# Patient Record
Sex: Male | Born: 1945 | Race: White | Hispanic: No | Marital: Married | Smoking: Never smoker
Health system: Southern US, Community
[De-identification: ages and names within clinical notes are randomized; demographics above are authoritative.]

## PROBLEM LIST (undated history)

## (undated) DIAGNOSIS — F325 Major depressive disorder, single episode, in full remission: Secondary | ICD-10-CM

## (undated) DIAGNOSIS — K219 Gastro-esophageal reflux disease without esophagitis: Secondary | ICD-10-CM

## (undated) DIAGNOSIS — Z8719 Personal history of other diseases of the digestive system: Secondary | ICD-10-CM

## (undated) DIAGNOSIS — I4891 Unspecified atrial fibrillation: Secondary | ICD-10-CM

## (undated) DIAGNOSIS — K4090 Unilateral inguinal hernia, without obstruction or gangrene, not specified as recurrent: Secondary | ICD-10-CM

## (undated) DIAGNOSIS — M5136 Other intervertebral disc degeneration, lumbar region: Secondary | ICD-10-CM

## (undated) DIAGNOSIS — T7840XA Allergy, unspecified, initial encounter: Secondary | ICD-10-CM

## (undated) DIAGNOSIS — I10 Essential (primary) hypertension: Secondary | ICD-10-CM

## (undated) DIAGNOSIS — E785 Hyperlipidemia, unspecified: Secondary | ICD-10-CM

## (undated) DIAGNOSIS — I499 Cardiac arrhythmia, unspecified: Secondary | ICD-10-CM

## (undated) DIAGNOSIS — Z973 Presence of spectacles and contact lenses: Secondary | ICD-10-CM

## (undated) DIAGNOSIS — C159 Malignant neoplasm of esophagus, unspecified: Secondary | ICD-10-CM

## (undated) DIAGNOSIS — N400 Enlarged prostate without lower urinary tract symptoms: Secondary | ICD-10-CM

## (undated) DIAGNOSIS — T17908A Unspecified foreign body in respiratory tract, part unspecified causing other injury, initial encounter: Secondary | ICD-10-CM

## (undated) DIAGNOSIS — M461 Sacroiliitis, not elsewhere classified: Secondary | ICD-10-CM

## (undated) DIAGNOSIS — K5790 Diverticulosis of intestine, part unspecified, without perforation or abscess without bleeding: Secondary | ICD-10-CM

## (undated) DIAGNOSIS — K3184 Gastroparesis: Secondary | ICD-10-CM

## (undated) DIAGNOSIS — J849 Interstitial pulmonary disease, unspecified: Secondary | ICD-10-CM

## (undated) DIAGNOSIS — J691 Pneumonitis due to inhalation of oils and essences: Secondary | ICD-10-CM

## (undated) DIAGNOSIS — I82409 Acute embolism and thrombosis of unspecified deep veins of unspecified lower extremity: Secondary | ICD-10-CM

## (undated) DIAGNOSIS — K449 Diaphragmatic hernia without obstruction or gangrene: Secondary | ICD-10-CM

## (undated) HISTORY — PX: HERNIA REPAIR: SHX51

## (undated) HISTORY — DX: Gastro-esophageal reflux disease without esophagitis: K21.9

## (undated) HISTORY — PX: EYE SURGERY: SHX253

## (undated) HISTORY — DX: Allergy, unspecified, initial encounter: T78.40XA

## (undated) HISTORY — DX: Unilateral inguinal hernia, without obstruction or gangrene, not specified as recurrent: K40.90

## (undated) HISTORY — PX: SPINAL FUSION: SHX223

## (undated) HISTORY — PX: SMALL INTESTINE SURGERY: SHX150

## (undated) HISTORY — DX: Sacroiliitis, not elsewhere classified: M46.1

## (undated) HISTORY — DX: Hyperlipidemia, unspecified: E78.5

## (undated) HISTORY — DX: Malignant neoplasm of esophagus, unspecified: C15.9

## (undated) HISTORY — PX: ABDOMINAL HERNIA REPAIR: SHX539

## (undated) HISTORY — DX: Gastroparesis: K31.84

## (undated) HISTORY — PX: OTHER SURGICAL HISTORY: SHX169

---
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
## 1995-06-27 HISTORY — PX: ESOPHAGECTOMY: SUR457

## 2010-04-13 DIAGNOSIS — K219 Gastro-esophageal reflux disease without esophagitis: Secondary | ICD-10-CM | POA: Insufficient documentation

## 2010-04-13 DIAGNOSIS — F32A Depression, unspecified: Secondary | ICD-10-CM | POA: Insufficient documentation

## 2010-04-13 DIAGNOSIS — I4891 Unspecified atrial fibrillation: Secondary | ICD-10-CM | POA: Insufficient documentation

## 2010-04-22 DIAGNOSIS — M5126 Other intervertebral disc displacement, lumbar region: Secondary | ICD-10-CM

## 2010-04-22 HISTORY — DX: Other intervertebral disc displacement, lumbar region: M51.26

## 2011-12-19 DIAGNOSIS — I499 Cardiac arrhythmia, unspecified: Secondary | ICD-10-CM

## 2011-12-19 HISTORY — DX: Cardiac arrhythmia, unspecified: I49.9

## 2012-01-10 DIAGNOSIS — K449 Diaphragmatic hernia without obstruction or gangrene: Secondary | ICD-10-CM | POA: Insufficient documentation

## 2012-03-20 DIAGNOSIS — C159 Malignant neoplasm of esophagus, unspecified: Secondary | ICD-10-CM | POA: Insufficient documentation

## 2013-10-06 DIAGNOSIS — G894 Chronic pain syndrome: Secondary | ICD-10-CM | POA: Insufficient documentation

## 2017-06-26 HISTORY — PX: COLONOSCOPY: SHX174

## 2019-01-15 ENCOUNTER — Ambulatory Visit (INDEPENDENT_AMBULATORY_CARE_PROVIDER_SITE_OTHER): Payer: Medicare Other | Admitting: Family Medicine

## 2019-01-15 ENCOUNTER — Other Ambulatory Visit: Payer: Self-pay

## 2019-01-15 VITALS — BP 122/80 | HR 99 | Wt 200.6 lb

## 2019-01-15 DIAGNOSIS — K449 Diaphragmatic hernia without obstruction or gangrene: Secondary | ICD-10-CM

## 2019-01-15 DIAGNOSIS — I824Y2 Acute embolism and thrombosis of unspecified deep veins of left proximal lower extremity: Secondary | ICD-10-CM

## 2019-01-15 DIAGNOSIS — Z0000 Encounter for general adult medical examination without abnormal findings: Secondary | ICD-10-CM | POA: Diagnosis present

## 2019-01-15 DIAGNOSIS — N401 Enlarged prostate with lower urinary tract symptoms: Secondary | ICD-10-CM

## 2019-01-15 DIAGNOSIS — I482 Chronic atrial fibrillation, unspecified: Secondary | ICD-10-CM

## 2019-01-15 DIAGNOSIS — M7989 Other specified soft tissue disorders: Secondary | ICD-10-CM | POA: Diagnosis not present

## 2019-01-15 DIAGNOSIS — M5136 Other intervertebral disc degeneration, lumbar region: Secondary | ICD-10-CM

## 2019-01-15 DIAGNOSIS — R3911 Hesitancy of micturition: Secondary | ICD-10-CM | POA: Diagnosis not present

## 2019-01-15 DIAGNOSIS — M51369 Other intervertebral disc degeneration, lumbar region without mention of lumbar back pain or lower extremity pain: Secondary | ICD-10-CM

## 2019-01-15 DIAGNOSIS — F325 Major depressive disorder, single episode, in full remission: Secondary | ICD-10-CM

## 2019-01-15 DIAGNOSIS — Z8719 Personal history of other diseases of the digestive system: Secondary | ICD-10-CM | POA: Diagnosis not present

## 2019-01-15 DIAGNOSIS — K5790 Diverticulosis of intestine, part unspecified, without perforation or abscess without bleeding: Secondary | ICD-10-CM | POA: Diagnosis not present

## 2019-01-15 DIAGNOSIS — K219 Gastro-esophageal reflux disease without esophagitis: Secondary | ICD-10-CM | POA: Diagnosis not present

## 2019-01-15 MED ORDER — TRAMADOL HCL 50 MG PO TABS: 50.0000 mg | ORAL_TABLET | Freq: Two times a day (BID) | ORAL | 2 refills | Status: DC | PRN

## 2019-01-15 NOTE — Progress Notes (Signed)
[facility] Phone: [phone]   cc: Establish care, leg swelling  Subjective:  Patient recently moved from [place] and is establishing care in [place].  He does not have a primary care doctor yet nor does he establish with any specialists.  He has a history of A. fib, esophageal cancer status post surgical esophagectomy, enlarged prostate, depression, spinal fusion for degenerative disc disease  Leg swelling: Patient states his left calf has been swelling over the past few days and his left calf is tender throughout most of the day.  He has no difficulty breathing.  No pain in the right leg.  Afib: Patient last saw his cardiologist in June at [place].  He has been taking propafenone and diltiazem.  He states this is working well, and has not felt any fluttering (he could previously feel fluttering when he had arrhythmia).  He has had this issue for 7 years.  No ablation.  He has never had a stroke.  GI: Patient had esophageal cancer 1997.  He had surgery to remove part of his esophagus.  This is left him with chronic acid reflux, for which he takes omeprazole.  He had a lot of "hernias" after surgery and 1 of his hernia operations nicked his colon, resulting in scar tissue.  He states he has had 3 small bowel obstructions requiring hospitalization in the past 3 years.  None of which required surgical treatment.  Previous one was June 07, 2018.  States he has a class IV hiatal hernia.  He had a colonoscopy in 2020.  Depression: Patient takes Effexor for depression.  He states this works well.  He has not felt any depressive symptoms, or worsening of depression.  No side effects.    BPH: His previous physician checked the patient's prostate with a yearly digital rectal exam as well as PSAs, although not yearly.  He states it is mildly enlarged.  Patient was on tamsulosin, and his previous session told him that adding finasteride was more beneficial than  doubling his tamsulosin dose, and states that whyhe is on finasteride and tamsulosin.  He has no history of prostate cancer.  Degenerative disc disease: Patient had an L2-L5 spinal fusion 2 years ago.  5 weeks ago he recently had an L5 and S1 fusion.  He goes back to his neurosurgeon in [place] next week, but will need to establish with neurosurgery here in [place].  The patient takes tramadol 50 mg as needed, which equates to approximately 1 or 2 times a day.  Some days he goes without taking it.  Patient states he cannot take NSAIDs because of his esophageal surgeries.  Patient states that his previous neurosurgeon recommended a [facility] out here in [place]. Tramadol 50mg  cant take NSAIDs d/t.  PRN is once or twice a day.  Some days without taking it.    Social: Patient works at [employer], but this is part-time.  He likes to stay active.  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  Patient is married, with children.  His son lives in [place], his daughter lives in [place].  Patient drinks a beer to every other day.  He does not smoke.  ROS: See HPI for pertinent positives and negatives  Past Medical History  Family history reviewed for today's visit. No changes.  Social history- patient is a non-smoker  Objective: BP 122/80   Pulse 99   Wt 200 lb 9.6 oz (91 kg)   SpO2 96%  Gen:  NAD, alert and oriented, cooperative with exam HEENT: NCAT, EOMI, MMM CV: normal rate, regular rhythm.  2/6 systolic murmur , no rubs.  Resp: LCTAB, no wheezes, crackles. normal work of breathing GI: nontender to palpation, BS present, no guarding or organomegaly Msk: Left leg has 1+ pitting edema in the distal foreleg.  No significant asymmetry between the left and right legs.  Left calf is tender to palpation. Neuro: CN II-XII grossly intact. no gross deficits Skin: No rashes, no lesions Psych: Appropriate behavior  Assessment/Plan: Leg swelling Patient planing of multiple  days of left calf soreness and swelling of the left ankle.  On exam he did have 1+ pitting edema and tenderness to palpation on the left side.  Patient has no history of DVT, not on anticoagulation.  Out of concern for DVT, sent patient to get ultrasound on left leg at the hospital.  Will treat with anticoagulation if DVT is confirmed.  Atrial fibrillation [facility]) Patient was diagnosed with atrial fibrillation 7 years ago.  He had a cardiologist in [place] who treated him with propafenone and diltiazem.  Patient states he is well-controlled these medications.  Patient would like to establish with cardiologist here, and would like referral.  Patient does not need any more prescriptions at the moment. -Place referral to cardiology  Acid reflux disease Patient was diagnosed with esophageal cancer 1997 and had a surgery to remove part of his esophagus, leaving him with a chronic hiatal hernia, which she states is class IV.  Patient currently takes omeprazole.  Does not need a refill at this moment. -Continue omeprazole as prescribed. -Refer to gastroenterology  H/O small bowel obstruction Patient has had multiple hospitalizations requiring medical management of small bowel obstruction in the past 3 years due to adhesions and scar tissue from previous abdominal surgeries.  Not currently experiencing any signs or symptoms of bowel obstruction.  Hiatal hernia Patient had surgical removal of part of his esophagus, leaving him with a hiatal hernia which he states is class IV.  Patient seeing a gastroenterologist [place], needs GI referral now that he lives in [place].  Takes omeprazole for the resultant acid reflux.  Depression, major, single episode, complete remission [facility]) Patient states he takes Effexor for depression which is currently well controlled he states.  Expensing any side effects.  Does not need any new refills currently. -Continue Effexor  BPH (benign prostatic hyperplasia)  Patient states he has been diagnosed with BPH and was having symptoms of urgency, hesitation but states he is well controlled on his tamsulosin 0.4 as well has his finasteride.  He states he has no history of prostate cancer and has recently had a PSA within the past 2 years at his primary care doctor in [place]. -Continue finasteride and tamsulosin  Degenerative disc disease, lumbar Patient previously had a L2-L5 spinal fusion for disc disease.  5 weeks ago he had L5-S1 fusion.  States he has chronic mild pain that gets worse with activity.  He is on tramadol 50 mg as needed.  He states he takes this 1-2 times a day, but not every day.  Check PDMP and patient does not fact get monthly prescriptions for 50 mg as needed. - Prescribed 1 month prescription for tramadol 50 mg twice daily as needed.    Clemetine Marker, MD PGY-2

## 2019-01-15 NOTE — Patient Instructions (Signed)
It was great to meet you today,  I will put in a referral for a gastroenterologist and a cardiologist.  I have refilled your tramadol at twice a day as needed.  Have put in a order for a ultrasound of your left leg.  You can go to the [facility], which is right next door and asked them for a radiology department and you can get the ultrasound done there.  I would like you get that done today if possible.  I would like to see you back in 2 months so we can discuss how things are going after you have talked with the new cardiologist and gastroenterologist, and see how your pain is doing after the spinal fusion.  Have a great day,  Clemetine Marker, MD

## 2019-01-16 ENCOUNTER — Ambulatory Visit (HOSPITAL_COMMUNITY)
Admission: RE | Admit: 2019-01-16 | Discharge: 2019-01-16 | Disposition: A | Discharge status: Home / Self Care | Payer: Medicare Other | Source: Ambulatory Visit | Attending: Family Medicine | Admitting: Family Medicine

## 2019-01-16 ENCOUNTER — Other Ambulatory Visit (HOSPITAL_COMMUNITY): Payer: Self-pay | Admitting: *Deleted

## 2019-01-16 ENCOUNTER — Other Ambulatory Visit (HOSPITAL_COMMUNITY): Payer: Self-pay

## 2019-01-16 ENCOUNTER — Ambulatory Visit (HOSPITAL_BASED_OUTPATIENT_CLINIC_OR_DEPARTMENT_OTHER)
Admission: RE | Admit: 2019-01-16 | Discharge: 2019-01-16 | Disposition: A | Discharge status: Home / Self Care | Payer: Medicare Other | Source: Ambulatory Visit | Attending: Family Medicine | Admitting: Family Medicine

## 2019-01-16 DIAGNOSIS — Z981 Arthrodesis status: Secondary | ICD-10-CM | POA: Diagnosis not present

## 2019-01-16 DIAGNOSIS — M5117 Intervertebral disc disorders with radiculopathy, lumbosacral region: Secondary | ICD-10-CM | POA: Diagnosis not present

## 2019-01-16 DIAGNOSIS — M5417 Radiculopathy, lumbosacral region: Secondary | ICD-10-CM

## 2019-01-16 DIAGNOSIS — M7989 Other specified soft tissue disorders: Secondary | ICD-10-CM | POA: Diagnosis not present

## 2019-01-16 LAB — CBC
Hematocrit: 38.8 % (ref 37.5–51.0)
Hemoglobin: 13.4 g/dL (ref 13.0–17.7)
MCH: 31.5 pg (ref 26.6–33.0)
MCHC: 34.5 g/dL (ref 31.5–35.7)
MCV: 91 fL (ref 79–97)
Platelets: 250 10*3/uL (ref 150–450)
RBC: 4.26 x10E6/uL (ref 4.14–5.80)
RDW: 11.9 % (ref 11.6–15.4)
WBC: 6.4 10*3/uL (ref 3.4–10.8)

## 2019-01-16 LAB — COMPREHENSIVE METABOLIC PANEL
ALT: 14 IU/L (ref 0–44)
AST: 15 IU/L (ref 0–40)
Albumin/Globulin Ratio: 1.8 (ref 1.2–2.2)
Albumin: 4.2 g/dL (ref 3.7–4.7)
Alkaline Phosphatase: 181 IU/L — ABNORMAL HIGH (ref 39–117)
BUN/Creatinine Ratio: 18 (ref 10–24)
BUN: 14 mg/dL (ref 8–27)
Bilirubin Total: <0.2 mg/dL (ref 0.0–1.2)
CO2: 23 mmol/L (ref 20–29)
Calcium: 9.6 mg/dL (ref 8.6–10.2)
Chloride: 102 mmol/L (ref 96–106)
Creatinine, Ser: 0.8 mg/dL (ref 0.76–1.27)
GFR calc Af Amer: 103 mL/min/{1.73_m2} (ref >59)
GFR calc non Af Amer: 89 mL/min/{1.73_m2} (ref >59)
Globulin, Total: 2.4 g/dL (ref 1.5–4.5)
Glucose: 119 mg/dL — ABNORMAL HIGH (ref 65–99)
Potassium: 4.7 mmol/L (ref 3.5–5.2)
Sodium: 138 mmol/L (ref 134–144)
Total Protein: 6.6 g/dL (ref 6.0–8.5)

## 2019-01-17 ENCOUNTER — Encounter: Payer: Self-pay | Admitting: Family Medicine

## 2019-01-17 DIAGNOSIS — M5136 Other intervertebral disc degeneration, lumbar region: Secondary | ICD-10-CM

## 2019-01-17 DIAGNOSIS — I82409 Acute embolism and thrombosis of unspecified deep veins of unspecified lower extremity: Secondary | ICD-10-CM

## 2019-01-17 DIAGNOSIS — N400 Enlarged prostate without lower urinary tract symptoms: Secondary | ICD-10-CM

## 2019-01-17 DIAGNOSIS — M51369 Other intervertebral disc degeneration, lumbar region without mention of lumbar back pain or lower extremity pain: Secondary | ICD-10-CM

## 2019-01-17 DIAGNOSIS — K5790 Diverticulosis of intestine, part unspecified, without perforation or abscess without bleeding: Secondary | ICD-10-CM

## 2019-01-17 DIAGNOSIS — K449 Diaphragmatic hernia without obstruction or gangrene: Secondary | ICD-10-CM

## 2019-01-17 DIAGNOSIS — F339 Major depressive disorder, recurrent, unspecified: Secondary | ICD-10-CM | POA: Insufficient documentation

## 2019-01-17 DIAGNOSIS — M7989 Other specified soft tissue disorders: Secondary | ICD-10-CM

## 2019-01-17 DIAGNOSIS — K219 Gastro-esophageal reflux disease without esophagitis: Secondary | ICD-10-CM | POA: Insufficient documentation

## 2019-01-17 DIAGNOSIS — I4891 Unspecified atrial fibrillation: Secondary | ICD-10-CM

## 2019-01-17 DIAGNOSIS — F325 Major depressive disorder, single episode, in full remission: Secondary | ICD-10-CM

## 2019-01-17 DIAGNOSIS — Z8719 Personal history of other diseases of the digestive system: Secondary | ICD-10-CM

## 2019-01-17 HISTORY — DX: Diverticulosis of intestine, part unspecified, without perforation or abscess without bleeding: K57.90

## 2019-01-17 HISTORY — DX: Personal history of other diseases of the digestive system: Z87.19

## 2019-01-17 HISTORY — DX: Major depressive disorder, single episode, in full remission: F32.5

## 2019-01-17 HISTORY — DX: Unspecified atrial fibrillation: I48.91

## 2019-01-17 HISTORY — DX: Gastro-esophageal reflux disease without esophagitis: K21.9

## 2019-01-17 HISTORY — DX: Other intervertebral disc degeneration, lumbar region: M51.36

## 2019-01-17 HISTORY — DX: Other specified soft tissue disorders: M79.89

## 2019-01-17 HISTORY — DX: Benign prostatic hyperplasia without lower urinary tract symptoms: N40.0

## 2019-01-17 HISTORY — DX: Diaphragmatic hernia without obstruction or gangrene: K44.9

## 2019-01-17 HISTORY — DX: Acute embolism and thrombosis of unspecified deep veins of unspecified lower extremity: I82.409

## 2019-01-17 HISTORY — DX: Other intervertebral disc degeneration, lumbar region without mention of lumbar back pain or lower extremity pain: M51.369

## 2019-01-17 NOTE — Assessment & Plan Note (Signed)
Patient planing of multiple days of left calf soreness and swelling of the left ankle.  On exam he did have 1+ pitting edema and tenderness to palpation on the left side.  Patient has no history of DVT, not on anticoagulation.  Out of concern for DVT, sent patient to get ultrasound on left leg at the hospital.  Will treat with anticoagulation if DVT is confirmed.

## 2019-01-17 NOTE — Assessment & Plan Note (Signed)
Patient was diagnosed with esophageal cancer 1997 and had a surgery to remove part of his esophagus, leaving him with a chronic hiatal hernia, which she states is class IV.  Patient currently takes omeprazole.  Does not need a refill at this moment. -Continue omeprazole as prescribed. -Refer to gastroenterology

## 2019-01-17 NOTE — Assessment & Plan Note (Signed)
Patient had surgical removal of part of his esophagus, leaving him with a hiatal hernia which he states is class IV.  Patient seeing a gastroenterologist [place], needs GI referral now that he lives in [place].  Takes omeprazole for the resultant acid reflux.

## 2019-01-17 NOTE — Assessment & Plan Note (Signed)
Patient has had multiple hospitalizations requiring medical management of small bowel obstruction in the past 3 years due to adhesions and scar tissue from previous abdominal surgeries.  Not currently experiencing any signs or symptoms of bowel obstruction.

## 2019-01-17 NOTE — Assessment & Plan Note (Signed)
Patient previously had a L2-L5 spinal fusion for disc disease.  5 weeks ago he had L5-S1 fusion.  States he has chronic mild pain that gets worse with activity.  He is on tramadol 50 mg as needed.  He states he takes this 1-2 times a day, but not every day.  Check PDMP and patient does not fact get monthly prescriptions for 50 mg as needed. - Prescribed 1 month prescription for tramadol 50 mg twice daily as needed.

## 2019-01-17 NOTE — Assessment & Plan Note (Signed)
Patient states he has been diagnosed with BPH and was having symptoms of urgency, hesitation but states he is well controlled on his tamsulosin 0.4 as well has his finasteride.  He states he has no history of prostate cancer and has recently had a PSA within the past 2 years at his primary care doctor in [place]. -Continue finasteride and tamsulosin

## 2019-01-17 NOTE — Assessment & Plan Note (Signed)
Patient states he takes Effexor for depression which is currently well controlled he states.  Expensing any side effects.  Does not need any new refills currently. -Continue Effexor

## 2019-01-17 NOTE — Assessment & Plan Note (Signed)
Patient was diagnosed with atrial fibrillation 7 years ago.  He had a cardiologist in [place] who treated him with propafenone and diltiazem.  Patient states he is well-controlled these medications.  Patient would like to establish with cardiologist here, and would like referral.  Patient does not need any more prescriptions at the moment. -Place referral to cardiology

## 2019-01-18 ENCOUNTER — Encounter: Payer: Self-pay | Admitting: Family Medicine

## 2019-01-19 ENCOUNTER — Telehealth: Payer: Self-pay | Admitting: Family Medicine

## 2019-01-19 NOTE — Telephone Encounter (Signed)
Called patient to inform him of the results of his lab work and ultrasound.  Told him there was no concerning findings on either.  Advised him to call the office if he had any questions or if the swelling and pain in his left leg worsened.

## 2019-01-31 ENCOUNTER — Telehealth: Payer: Self-pay | Admitting: Gastroenterology

## 2019-01-31 NOTE — Telephone Encounter (Signed)
DOD 01/20/19  Dr. Silverio Decamp, this pt has GI hx at [facility] (records in [place] for review.)  Pt requested to transfer care due to proximity because he had just moved to [place].  Pt has hx of esophageal cancer.   Will you accept this pt?

## 2019-02-04 ENCOUNTER — Encounter: Payer: Self-pay | Admitting: Gastroenterology

## 2019-02-04 NOTE — Telephone Encounter (Signed)
Okay, please schedule next available office visit.  Thanks

## 2019-02-06 ENCOUNTER — Other Ambulatory Visit: Payer: Self-pay | Admitting: *Deleted

## 2019-02-07 MED ORDER — VENLAFAXINE HCL ER 150 MG PO CP24: 150.0000 mg | ORAL_CAPSULE | Freq: Every day | ORAL | 3 refills | Status: DC

## 2019-02-07 MED ORDER — TAMSULOSIN HCL 0.4 MG PO CAPS: 0.4000 mg | ORAL_CAPSULE | Freq: Every day | ORAL | 3 refills | Status: DC

## 2019-02-07 MED ORDER — DILTIAZEM HCL 120 MG PO TABS: 120.0000 mg | ORAL_TABLET | Freq: Every day | ORAL | 3 refills | Status: DC

## 2019-03-06 ENCOUNTER — Other Ambulatory Visit: Payer: Self-pay

## 2019-03-07 MED ORDER — OMEPRAZOLE 40 MG PO CPDR: 40.0000 mg | DELAYED_RELEASE_CAPSULE | Freq: Two times a day (BID) | ORAL | 3 refills | Status: DC

## 2019-03-16 NOTE — Progress Notes (Signed)
Cardiology Office Note:    Date:  03/18/2019   ID:  Thomas Dominguez, [place] July 28, 1945, MRN [id number]  PCP:  Benay Pike, MD  Cardiologist:  No primary care provider on file.  Electrophysiologist:  None   Referring MD: Martyn Malay, MD   Chief complaint: AF  History of Present Illness:    Thomas Dominguez. Thomas Dominguez is a 73 y.o. male with a hx of atrial fibrillation, esophageal cancer status post esophagectomy, BPH, depression, degenerative disc disease who is referred by Dr. Owens Shark for evaluation of atrial fibrillation.  He reports that he was diagnosed with atrial fibrillation 7 years ago.  He was previously seeing a cardiologist in [place].  He had been alternating stress test and echo every other year.  Has been on propafenone and diltiazem for 7 years.  States that he always feels it when he goes into AF.  Will occur every 3-4 days, can last up to 1 hour.  Feels short of breath when in AF.  Had back surgery in June, has been improving.  States that walks 1.5 miles per day.  Has been having dyspnea with exertion but able to complete his walks.  He reports that he checks his BP at home, has always been normal.     Past Medical History:  Diagnosis Date  . Acid reflux disease 01/17/2019  . Atrial fibrillation ([place] [place]) 01/17/2019  . BPH (benign prostatic hyperplasia) 01/17/2019  . Degenerative disc disease, lumbar 01/17/2019  . Depression, major, single episode, complete remission ([place]) 01/17/2019  . Diverticulosis 01/17/2019  . DVT (deep venous thrombosis) ([place]) 01/17/2019  . H/O small bowel obstruction 01/17/2019  . Hiatal hernia 01/17/2019    Past Surgical History:  Procedure Laterality Date  . ABDOMINAL HERNIA REPAIR  2012, 2014, 2017  . ESOPHAGECTOMY  1997   for esophageal cancer  . SPINAL FUSION  2018, 2020   L2 L5 in 2018, L5-S1 2020    Current Medications: Current Meds  Medication Sig  . aspirin EC 81 MG tablet Take 81 mg by mouth daily.  Marland Kitchen diltiazem (CARDIZEM) 120 MG tablet  Take 1 tablet (120 mg total) by mouth daily.  . finasteride (PROSCAR) 5 MG tablet Take 5 mg by mouth daily.  Marland Kitchen omeprazole (PRILOSEC) 40 MG capsule Take 1 capsule (40 mg total) by mouth 2 (two) times daily.  . propafenone (RYTHMOL) 225 MG tablet Take 225 mg by mouth every 8 (eight) hours.  . tamsulosin (FLOMAX) 0.4 MG CAPS capsule Take 1 capsule (0.4 mg total) by mouth daily.  . traMADol (ULTRAM) 50 MG tablet Take 1 tablet (50 mg total) by mouth every 12 (twelve) hours as needed for moderate pain.  Marland Kitchen venlafaxine XR (EFFEXOR-XR) 150 MG 24 hr capsule Take 1 capsule (150 mg total) by mouth daily with breakfast.     Allergies:   Meloxicam and Statins   Social History   Socioeconomic History  . Marital status: Married    Spouse name: [name]  . Number of children: 2  . Years of education: Not on file  . Highest education level: Not on file  Occupational History    Employer: [employer]  . Financial resource strain: Not on file  . Food insecurity    Worry: Not on file    Inability: Not on file  . Transportation needs    Medical: Not on file    Non-medical: Not on file  Tobacco Use  . Smoking status: Never Smoker  . Smokeless tobacco:  Never Used  Substance and Sexual Activity  . Alcohol use: Yes    Alcohol/week: 7.0 standard drinks    Types: 7 Cans of beer per week  . Drug use: Never  . Sexual activity: Not on file  Lifestyle  . Physical activity    Days per week: Not on file    Minutes per session: Not on file  . Stress: Not on file  Relationships  . Social Herbalist on phone: Not on file    Gets together: Not on file    Attends religious service: Not on file    Active member of club or organization: Not on file    Attends meetings of clubs or organizations: Not on file    Relationship status: Not on file  Other Topics Concern  . Not on file  Social History Narrative  . Not on file     Family History: The patient's family history includes  Emphysema in his father.  ROS:   Please see the history of present illness.     All other systems reviewed and are negative.  EKGs/Labs/Other Studies Reviewed:    The following studies were reviewed today:   EKG:  EKG is ordered today.  The ekg ordered today demonstrates NSR, rate 81, no ST/T changes  Recent Labs: 01/15/2019: ALT 14; BUN 14; Creatinine, Ser 0.80; Hemoglobin 13.4; Platelets 250; Potassium 4.7; Sodium 138  Recent Lipid Panel No results found for: CHOL, TRIG, HDL, CHOLHDL, VLDL, LDLCALC, LDLDIRECT  Physical Exam:    VS:  BP 137/87 (BP Location: Right Arm)   Pulse 81   Ht 6\' 1"  (1.854 m)   Wt 206 lb 6.4 oz (93.6 kg)   SpO2 98%   BMI 27.23 kg/m     Wt Readings from Last 3 Encounters:  03/18/19 206 lb 6.4 oz (93.6 kg)  01/15/19 200 lb 9.6 oz (91 kg)     GEN:  Well nourished, well developed in no acute distress HEENT: Normal NECK: No JVD; No carotid bruits LYMPHATICS: No lymphadenopathy CARDIAC: RRR, no murmurs, rubs, gallops RESPIRATORY:  Clear to auscultation without rales, wheezing or rhonchi  ABDOMEN: Soft, non-tender, non-distended MUSCULOSKELETAL:  No edema; No deformity  SKIN: Warm and dry NEUROLOGIC:  Alert and oriented x 3 PSYCHIATRIC:  Normal affect   ASSESSMENT:    1. Atrial fibrillation, unspecified type ([place])   2. Dyspnea on exertion    PLAN:    In order of problems listed above:  Atrial fibrillation: paroxysmal, on propafenone 225 mg 3 times daily and diltiazem 120 mg daily.  CHADS-VASc 1 given age - Discussed with patient that with CHADS-VASc 1 could consider anticoagulation but he prefers to hold off for now - Continue propafenone and diltiazem.  Check TTE to ensure no structural heart disease given propafenone use - Given symptoms suggestive of recurrent AF occurring every 3-4 days, will plan for Zio patch x 2 weeks.  If having recurrent AF and continues to be symptomatic, will plan for referral to EP to discuss further antiarrhythmic  options vs ablation  Dyspnea: could be related to deconditioning as he recovers from recent back surgery, but may be due to AF as above - TTE   Medication Adjustments/Labs and Tests Ordered: Current medicines are reviewed at length with the patient today.  Concerns regarding medicines are outlined above.  Orders Placed This Encounter  Procedures  . LONG TERM MONITOR (3-14 DAYS)  . EKG 12-Lead  . ECHOCARDIOGRAM COMPLETE   No orders  of the defined types were placed in this encounter.   Patient Instructions  Medication Instructions:  Your physician recommends that you continue on your current medications as directed. Please refer to the Current Medication list given to you today.  If you need a refill on your cardiac medications before your next appointment, please call your pharmacy.   Lab work: NONE  Testing/Procedures: Your physician has requested that you have an echocardiogram. Echocardiography is a painless test that uses sound waves to create images of your heart. It provides your doctor with information about the size and shape of your heart and how well your heart's chambers and valves are working. This procedure takes approximately one hour. There are no restrictions for this procedure. [place] has recommended that you wear a 14 DAY ZIO-PATCH monitor. The Zio patch cardiac monitor continuously records heart rhythm data for up to 14 days, this is for patients being evaluated for multiple types heart rhythms. For the first 24 hours post application, please avoid getting the Zio monitor wet in the shower or by excessive sweating during exercise. After that, feel free to carry on with regular activities. Keep soaps and lotions away from the ZIO XT Patch.     Follow-Up: Your physician recommends that you schedule a follow-up appointment in: 3 months with Dr. Gardiner Rhyme         Echocardiogram An echocardiogram is a procedure that uses  painless sound waves (ultrasound) to produce an image of the heart. Images from an echocardiogram can provide important information about:  Signs of coronary artery disease (CAD).  Aneurysm detection. An aneurysm is a weak or damaged part of an artery wall that bulges out from the normal force of blood pumping through the body.  Heart size and shape. Changes in the size or shape of the heart can be associated with certain conditions, including heart failure, aneurysm, and CAD.  Heart muscle function.  Heart valve function.  Signs of a past heart attack.  Fluid buildup around the heart.  Thickening of the heart muscle.  A tumor or infectious growth around the heart valves. Tell a health care provider about:  Any allergies you have.  All medicines you are taking, including vitamins, herbs, eye drops, creams, and over-the-counter medicines.  Any blood disorders you have.  Any surgeries you have had.  Any medical conditions you have.  Whether you are pregnant or may be pregnant. What are the risks? Generally, this is a safe procedure. However, problems may occur, including:  Allergic reaction to dye (contrast) that may be used during the procedure. What happens before the procedure? No specific preparation is needed. You may eat and drink normally. What happens during the procedure?   An IV tube may be inserted into one of your veins.  You may receive contrast through this tube. A contrast is an injection that improves the quality of the pictures from your heart.  A gel will be applied to your chest.  A wand-like tool (transducer) will be moved over your chest. The gel will help to transmit the sound waves from the transducer.  The sound waves will harmlessly bounce off of your heart to allow the heart images to be captured in real-time motion. The images will be recorded on a computer. The procedure may vary among health care providers and hospitals. What happens after  the procedure?  You may return to your normal, everyday life, including diet, activities, and medicines, unless  your health care provider tells you not to do that. Summary  An echocardiogram is a procedure that uses painless sound waves (ultrasound) to produce an image of the heart.  Images from an echocardiogram can provide important information about the size and shape of your heart, heart muscle function, heart valve function, and fluid buildup around your heart.  You do not need to do anything to prepare before this procedure. You may eat and drink normally.  After the echocardiogram is completed, you may return to your normal, everyday life, unless your health care provider tells you not to do that. This information is not intended to replace advice given to you by your health care provider. Make sure you discuss any questions you have with your health care provider. Document Released: 06/09/2000 Document Revised: 10/03/2018 Document Reviewed: 07/15/2016 Elsevier Patient Education  2020 [place], Donato Heinz, MD  03/18/2019 11:28 AM    [facility]

## 2019-03-17 ENCOUNTER — Ambulatory Visit: Payer: Medicare Other | Admitting: Gastroenterology

## 2019-03-18 ENCOUNTER — Ambulatory Visit (INDEPENDENT_AMBULATORY_CARE_PROVIDER_SITE_OTHER): Payer: Medicare HMO | Admitting: Cardiology

## 2019-03-18 ENCOUNTER — Other Ambulatory Visit: Payer: Self-pay

## 2019-03-18 ENCOUNTER — Encounter: Payer: Self-pay | Admitting: Cardiology

## 2019-03-18 VITALS — BP 137/87 | HR 81 | Ht 73.0 in | Wt 206.4 lb

## 2019-03-18 DIAGNOSIS — I4891 Unspecified atrial fibrillation: Secondary | ICD-10-CM

## 2019-03-18 DIAGNOSIS — R0609 Other forms of dyspnea: Secondary | ICD-10-CM

## 2019-03-18 DIAGNOSIS — R0600 Dyspnea, unspecified: Secondary | ICD-10-CM

## 2019-03-18 NOTE — Patient Instructions (Signed)
Medication Instructions:  Your physician recommends that you continue on your current medications as directed. Please refer to the Current Medication list given to you today.  If you need a refill on your cardiac medications before your next appointment, please call your pharmacy.   Lab work: NONE  Testing/Procedures: Your physician has requested that you have an echocardiogram. Echocardiography is a painless test that uses sound waves to create images of your heart. It provides your doctor with information about the size and shape of your heart and how well your heart's chambers and valves are working. This procedure takes approximately one hour. There are no restrictions for this procedure. [place] has recommended that you wear a 14 DAY ZIO-PATCH monitor. The Zio patch cardiac monitor continuously records heart rhythm data for up to 14 days, this is for patients being evaluated for multiple types heart rhythms. For the first 24 hours post application, please avoid getting the Zio monitor wet in the shower or by excessive sweating during exercise. After that, feel free to carry on with regular activities. Keep soaps and lotions away from the ZIO XT Patch.     Follow-Up: Your physician recommends that you schedule a follow-up appointment in: 3 months with Dr. Gardiner Rhyme         Echocardiogram An echocardiogram is a procedure that uses painless sound waves (ultrasound) to produce an image of the heart. Images from an echocardiogram can provide important information about:  Signs of coronary artery disease (CAD).  Aneurysm detection. An aneurysm is a weak or damaged part of an artery wall that bulges out from the normal force of blood pumping through the body.  Heart size and shape. Changes in the size or shape of the heart can be associated with certain conditions, including heart failure, aneurysm, and CAD.  Heart muscle function.  Heart valve  function.  Signs of a past heart attack.  Fluid buildup around the heart.  Thickening of the heart muscle.  A tumor or infectious growth around the heart valves. Tell a health care provider about:  Any allergies you have.  All medicines you are taking, including vitamins, herbs, eye drops, creams, and over-the-counter medicines.  Any blood disorders you have.  Any surgeries you have had.  Any medical conditions you have.  Whether you are pregnant or may be pregnant. What are the risks? Generally, this is a safe procedure. However, problems may occur, including:  Allergic reaction to dye (contrast) that may be used during the procedure. What happens before the procedure? No specific preparation is needed. You may eat and drink normally. What happens during the procedure?   An IV tube may be inserted into one of your veins.  You may receive contrast through this tube. A contrast is an injection that improves the quality of the pictures from your heart.  A gel will be applied to your chest.  A wand-like tool (transducer) will be moved over your chest. The gel will help to transmit the sound waves from the transducer.  The sound waves will harmlessly bounce off of your heart to allow the heart images to be captured in real-time motion. The images will be recorded on a computer. The procedure may vary among health care providers and hospitals. What happens after the procedure?  You may return to your normal, everyday life, including diet, activities, and medicines, unless your health care provider tells you not to do that. Summary  An echocardiogram is  a procedure that uses painless sound waves (ultrasound) to produce an image of the heart.  Images from an echocardiogram can provide important information about the size and shape of your heart, heart muscle function, heart valve function, and fluid buildup around your heart.  You do not need to do anything to prepare  before this procedure. You may eat and drink normally.  After the echocardiogram is completed, you may return to your normal, everyday life, unless your health care provider tells you not to do that. This information is not intended to replace advice given to you by your health care provider. Make sure you discuss any questions you have with your health care provider. Document Released: 06/09/2000 Document Revised: 10/03/2018 Document Reviewed: 07/15/2016 Elsevier Patient Education  2020 [employer].

## 2019-03-20 ENCOUNTER — Other Ambulatory Visit (HOSPITAL_COMMUNITY): Payer: Self-pay | Admitting: *Deleted

## 2019-03-20 ENCOUNTER — Other Ambulatory Visit: Payer: Self-pay

## 2019-03-20 ENCOUNTER — Other Ambulatory Visit (HOSPITAL_COMMUNITY): Payer: Self-pay

## 2019-03-20 ENCOUNTER — Ambulatory Visit (HOSPITAL_COMMUNITY)
Admission: RE | Admit: 2019-03-20 | Discharge: 2019-03-20 | Disposition: A | Discharge status: Home / Self Care | Payer: Medicare HMO | Source: Ambulatory Visit | Attending: Surgical | Admitting: Surgical

## 2019-03-20 DIAGNOSIS — Z09 Encounter for follow-up examination after completed treatment for conditions other than malignant neoplasm: Secondary | ICD-10-CM

## 2019-03-20 DIAGNOSIS — Z981 Arthrodesis status: Secondary | ICD-10-CM | POA: Insufficient documentation

## 2019-03-20 DIAGNOSIS — M5117 Intervertebral disc disorders with radiculopathy, lumbosacral region: Secondary | ICD-10-CM | POA: Insufficient documentation

## 2019-03-20 DIAGNOSIS — M532X7 Spinal instabilities, lumbosacral region: Secondary | ICD-10-CM | POA: Insufficient documentation

## 2019-03-20 DIAGNOSIS — M4326 Fusion of spine, lumbar region: Secondary | ICD-10-CM | POA: Diagnosis not present

## 2019-03-20 DIAGNOSIS — M47816 Spondylosis without myelopathy or radiculopathy, lumbar region: Secondary | ICD-10-CM | POA: Diagnosis not present

## 2019-03-25 ENCOUNTER — Other Ambulatory Visit: Payer: Self-pay

## 2019-03-25 ENCOUNTER — Ambulatory Visit (INDEPENDENT_AMBULATORY_CARE_PROVIDER_SITE_OTHER): Payer: Medicare HMO

## 2019-03-25 DIAGNOSIS — I4891 Unspecified atrial fibrillation: Secondary | ICD-10-CM

## 2019-03-25 DIAGNOSIS — M545 Low back pain: Secondary | ICD-10-CM | POA: Diagnosis not present

## 2019-03-26 ENCOUNTER — Other Ambulatory Visit: Payer: Self-pay

## 2019-03-27 ENCOUNTER — Other Ambulatory Visit: Payer: Self-pay

## 2019-03-28 NOTE — Telephone Encounter (Signed)
Yes we can refill 

## 2019-04-01 NOTE — Telephone Encounter (Signed)
Pt calling requesting a refill on propafenone 225 mg tablet sent to [facility] mail order pharmacy. Please address

## 2019-04-02 ENCOUNTER — Telehealth: Payer: Self-pay | Admitting: *Deleted

## 2019-04-02 ENCOUNTER — Other Ambulatory Visit: Payer: Self-pay

## 2019-04-02 ENCOUNTER — Ambulatory Visit (HOSPITAL_COMMUNITY): Payer: Medicare HMO | Attending: Cardiovascular Disease

## 2019-04-02 DIAGNOSIS — R0600 Dyspnea, unspecified: Secondary | ICD-10-CM | POA: Diagnosis not present

## 2019-04-02 DIAGNOSIS — R0609 Other forms of dyspnea: Secondary | ICD-10-CM

## 2019-04-02 MED ORDER — PROPAFENONE HCL 225 MG PO TABS: 225.0000 mg | ORAL_TABLET | Freq: Three times a day (TID) | ORAL | 1 refills | Status: DC

## 2019-04-02 MED ORDER — PROPAFENONE HCL 225 MG PO TABS: 225.0000 mg | ORAL_TABLET | Freq: Three times a day (TID) | ORAL | 0 refills | Status: DC

## 2019-04-02 NOTE — Telephone Encounter (Signed)
Rx request sent to pharmacy.  

## 2019-04-02 NOTE — Telephone Encounter (Signed)
Propafenone was sent to [facility] for a 30 day supply, received refill request from [facility] mail order. Please send 90 day supply.

## 2019-04-02 NOTE — Telephone Encounter (Signed)
Received call from [facility] [phone] regarding patients Effexor and Rythmol potential interaction. Neither was started by Dr Gardiner Rhyme when he saw him on 03/18/19 and both on medication list. Discussed with Raquel R Pharm D and ok to continue. Advised [facility]

## 2019-04-02 NOTE — Addendum Note (Signed)
Addended by: Therisa Doyne on: 04/02/2019 11:09 AM   Modules accepted: Orders

## 2019-04-03 ENCOUNTER — Encounter: Payer: Self-pay | Admitting: Family Medicine

## 2019-04-04 ENCOUNTER — Other Ambulatory Visit: Payer: Self-pay | Admitting: *Deleted

## 2019-04-04 ENCOUNTER — Other Ambulatory Visit: Payer: Self-pay | Admitting: Family Medicine

## 2019-04-04 MED ORDER — TAMSULOSIN HCL 0.4 MG PO CAPS: 0.8000 mg | ORAL_CAPSULE | Freq: Every day | ORAL | 3 refills | Status: DC

## 2019-04-04 MED ORDER — FINASTERIDE 5 MG PO TABS: 5.0000 mg | ORAL_TABLET | Freq: Every day | ORAL | 3 refills | Status: DC

## 2019-04-04 NOTE — Progress Notes (Signed)
Increased tamsulosin to 0.8mg  daily d/t worsening bph symptoms.

## 2019-04-17 DIAGNOSIS — I4891 Unspecified atrial fibrillation: Secondary | ICD-10-CM | POA: Diagnosis not present

## 2019-04-18 ENCOUNTER — Encounter

## 2019-04-18 ENCOUNTER — Encounter: Payer: Self-pay | Admitting: Gastroenterology

## 2019-04-18 ENCOUNTER — Ambulatory Visit: Payer: Medicare HMO | Admitting: Gastroenterology

## 2019-04-18 VITALS — BP 96/60 | HR 80 | Temp 97.8°F | Ht 71.0 in | Wt 204.2 lb

## 2019-04-18 DIAGNOSIS — Z86003 Personal history of in-situ neoplasm of oral cavity, esophagus and stomach: Secondary | ICD-10-CM | POA: Diagnosis not present

## 2019-04-18 DIAGNOSIS — Z8719 Personal history of other diseases of the digestive system: Secondary | ICD-10-CM

## 2019-04-18 DIAGNOSIS — K5900 Constipation, unspecified: Secondary | ICD-10-CM | POA: Diagnosis not present

## 2019-04-18 DIAGNOSIS — K219 Gastro-esophageal reflux disease without esophagitis: Secondary | ICD-10-CM | POA: Diagnosis not present

## 2019-04-18 DIAGNOSIS — K589 Irritable bowel syndrome without diarrhea: Secondary | ICD-10-CM | POA: Diagnosis not present

## 2019-04-18 DIAGNOSIS — K5790 Diverticulosis of intestine, part unspecified, without perforation or abscess without bleeding: Secondary | ICD-10-CM

## 2019-04-18 MED ORDER — SUCRALFATE 1 G PO TABS: 1.0000 g | ORAL_TABLET | Freq: Three times a day (TID) | ORAL | 3 refills | Status: DC

## 2019-04-18 MED ORDER — OMEPRAZOLE 40 MG PO CPDR: 40.0000 mg | DELAYED_RELEASE_CAPSULE | Freq: Two times a day (BID) | ORAL | 3 refills | Status: DC

## 2019-04-18 NOTE — Progress Notes (Signed)
Thomas Dominguez    [id number]    June 08, 1946  [facility], Garen Lah, MD  Referring Physician: Martyn Malay, MD [facility],  [place] [address]   Chief complaint: Hiatal hernia, GERD  HPI: 73 year old male with history of Barrett's esophagus, intramucosal adenocarcinoma s/p esophageal resection, chronic GERD here to establish care.  He continues to have persistent GERD symptoms despite taking PPI daily.  Symptoms have improved since he started taking omeprazole twice daily.  No longer has dysphagia or odynophagia.  Denies any vomiting, abdominal pain, melena or blood per rectum.  He was treated with oral antibiotics earlier this year for diverticulitis.  No complications.  No family history of GI malignancy  Relevant history :  04/02/2019: LVEF 55-60%  Colonoscopy 10/03/2017 Dr. Shirlyn Goltz (f/u diverticulitis)- severe pancolonic diverticulosis, tortuosity & spasm  EGD 03/2015 Dr. Shirlyn Goltz- changes of prior esophagectomy and gastric pull-through, normal mucosa throughout  EGD 03/2011 Dr. Shirlyn Goltz- surgically shortened anastomosis at 21 cm, no Barrett's changes seen. Fair amount of solid food retention. Gastric food retention obscuring pylorus. Very angulated atrium without stenosis, probably contributing to emptying difficulties. Because of coughing difficulties did not pursue duodenal intubation  EGD 10/2010 Dr. Shirlyn Goltz- gastric food retention obscuring pylorus  CLN 09/12/2012 Dr. Shirlyn Goltz- pancolonic diverticulosis, less than ideal prep, internal hemorrhoids  CLN 2004- severe left-sided diverticulosis  RECENT GI IMAGING STUDIES:  AXR 05/2018- no free air identified, no dilated bowel loops  CTAP 05/2018 SMALL BOWEL OBSTRUCTION. TRANSITION POINT IS NEAR A SMALL BOWEL ANASTOMOSIS IN THE DISTAL SMALL BOWEL CENTRAL ABDOMEN IN THE REGION OF THE CENTRAL MESENTERY. EXACT TRANSITION POINT IS DIFFICULT TO IDENTIFY. EFFUSIONS CANNOT BE EXCLUDED. NO  DEFINITIVE FOCAL MASS. NO ADDITIONAL SIGNIFICANT CHANGE.  PET scan 07/2017 NO EVIDENCE OF HYPERMETABOLIC MALIGNANCY IN THE CHEST, ABDOMEN OR PELVIS. HYPERMETABOLIC ACTIVITY NEAR THE SMALL BOWEL ANASTOMOSIS HAS THE APPEARANCE OF PHYSIOLOGIC INTRALUMINAL BOWEL ACTIVITY WITH NO PERCEIVED MASS SEEN ON TODAY'S EXAM  PREVIOUS ABDOMINAL SURGERIES: Ventral hernia repair, paraesophageal hernia repair (2013), esophagectomy (1997)  CANCER HISTORY: Esophageal cancer s/p esophagectomy 1997 at [facility]. No family history of colon cancer.  Barium swallow 08/28/96 Post op anastomotic stricture s/p esophagogastrectomy. 9mm barium tablet did not traverse.  Outpatient Encounter Medications as of 04/18/2019  Medication Sig  . aspirin EC 81 MG tablet Take 81 mg by mouth daily.  Marland Kitchen diltiazem (CARDIZEM) 120 MG tablet Take 1 tablet (120 mg total) by mouth daily.  . finasteride (PROSCAR) 5 MG tablet Take 1 tablet (5 mg total) by mouth daily.  Marland Kitchen omeprazole (PRILOSEC) 40 MG capsule Take 1 capsule (40 mg total) by mouth 2 (two) times daily.  . propafenone (RYTHMOL) 225 MG tablet Take 1 tablet (225 mg total) by mouth every 8 (eight) hours.  . tamsulosin (FLOMAX) 0.4 MG CAPS capsule Take 2 capsules (0.8 mg total) by mouth daily.  Marland Kitchen venlafaxine XR (EFFEXOR-XR) 150 MG 24 hr capsule Take 1 capsule (150 mg total) by mouth daily with breakfast.   No facility-administered encounter medications on file as of 04/18/2019.     Allergies as of 04/18/2019 - Review Complete 04/18/2019  Allergen Reaction Noted  . Mobic [meloxicam]  01/17/2019  . Statins  01/17/2019    Past Medical History:  Diagnosis Date  . Acid reflux disease 01/17/2019  . Atrial fibrillation ([place]) 01/17/2019  . BPH (benign prostatic hyperplasia) 01/17/2019  . Degenerative disc disease, lumbar 01/17/2019  . Depression, major, single episode, complete  remission ([place]) 01/17/2019  . Diverticulosis 01/17/2019  . DVT (deep venous thrombosis) ([place]) 01/17/2019  .  Esophageal cancer ([place])   . GERD (gastroesophageal reflux disease)   . H/O small bowel obstruction 01/17/2019  . Hiatal hernia 01/17/2019    Past Surgical History:  Procedure Laterality Date  . ABDOMINAL HERNIA REPAIR  2012, 2014, 2017   ventral  . ESOPHAGECTOMY  1997   for esophageal cancer  . SPINAL FUSION  2018, 2020   L2 L5 in 2018, L5-S1 2020    Family History  Problem Relation Age of Onset  . Emphysema Father     Social History   Socioeconomic History  . Marital status: Married    Spouse name: [name]  . Number of children: 2  . Years of education: Not on file  . Highest education level: Not on file  Occupational History  . Occupation: retired    Fish farm manager: [employer]  Social Needs  . Financial resource strain: Not on file  . Food insecurity    Worry: Not on file    Inability: Not on file  . Transportation needs    Medical: Not on file    Non-medical: Not on file  Tobacco Use  . Smoking status: Never Smoker  . Smokeless tobacco: Never Used  Substance and Sexual Activity  . Alcohol use: Yes    Alcohol/week: 7.0 standard drinks    Types: 7 Cans of beer per week  . Drug use: Never  . Sexual activity: Not on file  Lifestyle  . Physical activity    Days per week: Not on file    Minutes per session: Not on file  . Stress: Not on file  Relationships  . Social Herbalist on phone: Not on file    Gets together: Not on file    Attends religious service: Not on file    Active member of club or organization: Not on file    Attends meetings of clubs or organizations: Not on file    Relationship status: Not on file  . Intimate partner violence    Fear of current or ex partner: Not on file    Emotionally abused: Not on file    Physically abused: Not on file    Forced sexual activity: Not on file  Other Topics Concern  . Not on file  Social History Narrative  . Not on file      Review of systems: Review of Systems  Constitutional: Negative  for fever and chills.  Positive for fatigue HENT: Negative.   Eyes: Negative for blurred vision.  Respiratory: Negative for cough, shortness of breath and wheezing.   Cardiovascular: Negative for chest pain and palpitations.  Gastrointestinal: as per HPI Genitourinary: Negative for dysuria, urgency, frequency and hematuria.  Musculoskeletal: Positive for myalgias, back pain and joint pain.  Skin: Negative for itching and rash.  Neurological: Negative for dizziness, tremors, focal weakness, seizures and loss of consciousness.  Endo/Heme/Allergies: Positive for seasonal allergies.  Psychiatric/Behavioral: Negative for depression, suicidal ideas and hallucinations.  Positive for patient and anxiety All other systems reviewed and are negative.   Physical Exam: Vitals:   04/18/19 1005  Temp: 97.8 F (36.6 C)   Body mass index is 28.49 kg/m. Gen:      No acute distress HEENT:  EOMI, sclera anicteric Neck:     No masses; no thyromegaly Lungs:    Clear to auscultation bilaterally; normal respiratory effort CV:  Regular rate and rhythm; no murmurs Abd:      + bowel sounds; soft, non-tender; no palpable masses, no distension Ext:    No edema; adequate peripheral perfusion Skin:      Warm and dry; no rash Neuro: alert and oriented x 3 Psych: normal mood and affect  Data Reviewed:  Reviewed labs, radiology imaging, old records and pertinent past GI work up   Assessment and Plan/Recommendations:  73 year old male with history of chronic GERD, hiatal hernia, Barrett's esophagus, esophageal adenocarcinoma status post resection hiatal hernia repair with persistent GERD symptoms.  Continue omeprazole twice daily Use Carafate 1 g before meals and at bedtime as needed Discussed antireflux measures, dietary and lifestyle modifications in detail  Pancolonic diverticulosis with history of diverticulitis April 2020 Up-to-date with colorectal cancer screening Benefiber 1 tablespoon 3  times daily with meals Increase fluid intake  IBS: Trial of IBgard 1 capsule up to 3 times daily as needed  Return in 6 months or sooner if needed   Greater than 50% of the time used for counseling  as well as treatment plan and follow-up. He had multiple questions which were answered to his satisfaction  K. Denzil Magnuson , MD    CC: Martyn Malay, MD

## 2019-04-18 NOTE — Patient Instructions (Addendum)
Continue Omeprazole twice daily  We will send Carafate to your pharmacy  Take IBGard 1 capsule three times a day as needed  Use Benefiber 1 tablespoon three times a day with meals   Gastroesophageal Reflux Disease, Adult Gastroesophageal reflux (GER) happens when acid from the stomach flows up into the tube that connects the mouth and the stomach (esophagus). Normally, food travels down the esophagus and stays in the stomach to be digested. However, when a person has GER, food and stomach acid sometimes move back up into the esophagus. If this becomes a more serious problem, the person may be diagnosed with a disease called gastroesophageal reflux disease (GERD). GERD occurs when the reflux:  Happens often.  Causes frequent or severe symptoms.  Causes problems such as damage to the esophagus. When stomach acid comes in contact with the esophagus, the acid may cause soreness (inflammation) in the esophagus. Over time, GERD may create small holes (ulcers) in the lining of the esophagus. What are the causes? This condition is caused by a problem with the muscle between the esophagus and the stomach (lower esophageal sphincter, or LES). Normally, the LES muscle closes after food passes through the esophagus to the stomach. When the LES is weakened or abnormal, it does not close properly, and that allows food and stomach acid to go back up into the esophagus. The LES can be weakened by certain dietary substances, medicines, and medical conditions, including:  Tobacco use.  Pregnancy.  Having a hiatal hernia.  Alcohol use.  Certain foods and beverages, such as coffee, chocolate, onions, and peppermint. What increases the risk? You are more likely to develop this condition if you:  Have an increased body weight.  Have a connective tissue disorder.  Use NSAID medicines. What are the signs or symptoms? Symptoms of this condition include:  Heartburn.  Difficult or painful  swallowing.  The feeling of having a lump in the throat.  Abitter taste in the mouth.  Bad breath.  Having a large amount of saliva.  Having an upset or bloated stomach.  Belching.  Chest pain. Different conditions can cause chest pain. Make sure you see your health care provider if you experience chest pain.  Shortness of breath or wheezing.  Ongoing (chronic) cough or a night-time cough.  Wearing away of tooth enamel.  Weight loss. How is this diagnosed? Your health care provider will take a medical history and perform a physical exam. To determine if you have mild or severe GERD, your health care provider may also monitor how you respond to treatment. You may also have tests, including:  A test to examine your stomach and esophagus with a small camera (endoscopy).  A test thatmeasures the acidity level in your esophagus.  A test thatmeasures how much pressure is on your esophagus.  A barium swallow or modified barium swallow test to show the shape, size, and functioning of your esophagus. How is this treated? The goal of treatment is to help relieve your symptoms and to prevent complications. Treatment for this condition may vary depending on how severe your symptoms are. Your health care provider may recommend:  Changes to your diet.  Medicine.  Surgery. Follow these instructions at home: Eating and drinking   Follow a diet as recommended by your health care provider. This may involve avoiding foods and drinks such as: ? Coffee and tea (with or without caffeine). ? Drinks that containalcohol. ? Energy drinks and sports drinks. ? Carbonated drinks or sodas. ? Chocolate  and cocoa. ? Peppermint and mint flavorings. ? Garlic and onions. ? Horseradish. ? Spicy and acidic foods, including peppers, chili powder, curry powder, vinegar, hot sauces, and barbecue sauce. ? Citrus fruit juices and citrus fruits, such as oranges, lemons, and limes. ? Tomato-based  foods, such as red sauce, chili, salsa, and pizza with red sauce. ? Fried and fatty foods, such as donuts, french fries, potato chips, and high-fat dressings. ? High-fat meats, such as hot dogs and fatty cuts of red and white meats, such as rib eye steak, sausage, ham, and bacon. ? High-fat dairy items, such as whole milk, butter, and cream cheese.  Eat small, frequent meals instead of large meals.  Avoid drinking large amounts of liquid with your meals.  Avoid eating meals during the 2-3 hours before bedtime.  Avoid lying down right after you eat.  Do not exercise right after you eat. Lifestyle   Do not use any products that contain nicotine or tobacco, such as cigarettes, e-cigarettes, and chewing tobacco. If you need help quitting, ask your health care provider.  Try to reduce your stress by using methods such as yoga or meditation. If you need help reducing stress, ask your health care provider.  If you are overweight, reduce your weight to an amount that is healthy for you. Ask your health care provider for guidance about a safe weight loss goal. General instructions  Pay attention to any changes in your symptoms.  Take over-the-counter and prescription medicines only as told by your health care provider. Do not take aspirin, ibuprofen, or other NSAIDs unless your health care provider told you to do so.  Wear loose-fitting clothing. Do not wear anything tight around your waist that causes pressure on your abdomen.  Raise (elevate) the head of your bed about 6 inches (15 cm).  Avoid bending over if this makes your symptoms worse.  Keep all follow-up visits as told by your health care provider. This is important. Contact a health care provider if:  You have: ? New symptoms. ? Unexplained weight loss. ? Difficulty swallowing or it hurts to swallow. ? Wheezing or a persistent cough. ? A hoarse voice.  Your symptoms do not improve with treatment. Get help right away if  you:  Have pain in your arms, neck, jaw, teeth, or back.  Feel sweaty, dizzy, or light-headed.  Have chest pain or shortness of breath.  Vomit and your vomit looks like blood or coffee grounds.  Faint.  Have stool that is bloody or black.  Cannot swallow, drink, or eat. Summary  Gastroesophageal reflux happens when acid from the stomach flows up into the esophagus. GERD is a disease in which the reflux happens often, causes frequent or severe symptoms, or causes problems such as damage to the esophagus.  Treatment for this condition may vary depending on how severe your symptoms are. Your health care provider may recommend diet and lifestyle changes, medicine, or surgery.  Contact a health care provider if you have new or worsening symptoms.  Take over-the-counter and prescription medicines only as told by your health care provider. Do not take aspirin, ibuprofen, or other NSAIDs unless your health care provider told you to do so.  Keep all follow-up visits as told by your health care provider. This is important. This information is not intended to replace advice given to you by your health care provider. Make sure you discuss any questions you have with your health care provider. Document Released: 03/22/2005 Document Revised: 12/19/2017 Document  Reviewed: 12/19/2017 Elsevier Patient Education  2020 [place] for Gastroesophageal Reflux Disease, Adult When you have gastroesophageal reflux disease (GERD), the foods you eat and your eating habits are very important. Choosing the right foods can help ease your discomfort. Think about working with a nutrition specialist (dietitian) to help you make good choices. What are tips for following this plan?  Meals  Choose healthy foods that are low in fat, such as fruits, vegetables, whole grains, low-fat dairy products, and lean meat, fish, and poultry.  Eat small meals often instead of 3 large meals a day. Eat your  meals slowly, and in a place where you are relaxed. Avoid bending over or lying down until 2-3 hours after eating.  Avoid eating meals 2-3 hours before bed.  Avoid drinking a lot of liquid with meals.  Cook foods using methods other than frying. Bake, grill, or broil food instead.  Avoid or limit: ? Chocolate. ? Peppermint or spearmint. ? Alcohol. ? Pepper. ? Black and decaffeinated coffee. ? Black and decaffeinated tea. ? Bubbly (carbonated) soft drinks. ? Caffeinated energy drinks and soft drinks.  Limit high-fat foods such as: ? Fatty meat or fried foods. ? Whole milk, cream, butter, or ice cream. ? Nuts and nut butters. ? Pastries, donuts, and sweets made with butter or shortening.  Avoid foods that cause symptoms. These foods may be different for everyone. Common foods that cause symptoms include: ? Tomatoes. ? Oranges, lemons, and limes. ? Peppers. ? Spicy food. ? Onions and garlic. ? Vinegar. Lifestyle  Maintain a healthy weight. Ask your doctor what weight is healthy for you. If you need to lose weight, work with your doctor to do so safely.  Exercise for at least 30 minutes for 5 or more days each week, or as told by your doctor.  Wear loose-fitting clothes.  Do not smoke. If you need help quitting, ask your doctor.  Sleep with the head of your bed higher than your feet. Use a wedge under the mattress or blocks under the bed frame to raise the head of the bed. Summary  When you have gastroesophageal reflux disease (GERD), food and lifestyle choices are very important in easing your symptoms.  Eat small meals often instead of 3 large meals a day. Eat your meals slowly, and in a place where you are relaxed.  Limit high-fat foods such as fatty meat or fried foods.  Avoid bending over or lying down until 2-3 hours after eating.  Avoid peppermint and spearmint, caffeine, alcohol, and chocolate. This information is not intended to replace advice given to you by  your health care provider. Make sure you discuss any questions you have with your health care provider. Document Released: 12/12/2011 Document Revised: 10/03/2018 Document Reviewed: 07/18/2016 Elsevier Patient Education  [place].   I appreciate the  opportunity to care for you  Thank You   Harl Bowie , MD

## 2019-04-21 ENCOUNTER — Other Ambulatory Visit: Payer: Self-pay

## 2019-04-21 MED ORDER — OMEPRAZOLE 40 MG PO CPDR: 40.0000 mg | DELAYED_RELEASE_CAPSULE | Freq: Two times a day (BID) | ORAL | 3 refills | Status: DC

## 2019-04-23 ENCOUNTER — Telehealth: Payer: Self-pay | Admitting: Cardiology

## 2019-04-23 NOTE — Telephone Encounter (Signed)
Sent patient results in [place], verbatim from MD  Notes recorded by Donato Heinz, MD on 04/23/2019 at 9:30 AM EDT  No atrial fibrillation seen. Does have some short runs of a different arrhythmia, SVT, but nothing lasting longer than 11 seconds. Would continue current medications

## 2019-04-23 NOTE — Telephone Encounter (Signed)
Follow Up:      Pt saw his Monitor results on My-Chart. Please call, he thinks he might need an appointment to come in and discuss this with Dr Gardiner Rhyme.

## 2019-04-24 ENCOUNTER — Encounter: Payer: Self-pay | Admitting: Gastroenterology

## 2019-04-26 ENCOUNTER — Other Ambulatory Visit: Payer: Self-pay | Admitting: Cardiology

## 2019-05-06 ENCOUNTER — Other Ambulatory Visit: Payer: Self-pay

## 2019-05-06 MED ORDER — PROPAFENONE HCL 225 MG PO TABS: 225.0000 mg | ORAL_TABLET | Freq: Three times a day (TID) | ORAL | 1 refills | Status: DC

## 2019-05-14 ENCOUNTER — Other Ambulatory Visit: Payer: Self-pay | Admitting: Cardiology

## 2019-06-09 NOTE — Progress Notes (Signed)
Cardiology Office Note:    Date:  06/10/2019   ID:  Thomas Dominguez, [place]thun 13-Oct-1945, MRN [id number]  PCP:  Benay Pike, MD  Cardiologist:  No primary care provider on file.  Electrophysiologist:  None   Referring MD: Benay Pike, MD   Chief complaint: AF  History of Present Illness:    Thomas Dominguez. [name] is a 73 y.o. male with a hx of atrial fibrillation, esophageal cancer status post esophagectomy, BPH, depression, degenerative disc disease who is referred by Dr. Owens Shark for evaluation of atrial fibrillation.  He reports that he was diagnosed with atrial fibrillation 7 years ago.  He was previously seeing a cardiologist in [place].  He had been alternating stress test and echo every other year.  Has been on propafenone and diltiazem for 7 years.  States that he always feels it when he goes into AF.  Will occur every 3-4 days, can last up to 1 hour.  Feels short of breath when in AF.  Had back surgery in June, has been improving.  States that walks 1.5 miles per day.  Has been having dyspnea with exertion but able to complete his walks.  He reports that he checks his BP at home, has always been normal.   TTE 04/02/2019 showed EF 55 to 39%, grade 2 diastolic dysfunction, normal RV function, no significant valvular disease.  14-day Zio patch showed no recurrent atrial fibrillation but did show 20 episodes of SVT, longest lasting 11 seconds.  Triggered events corresponded to sinus rhythm and rare PVCs.  Reports that palpitations have resolved.  Reports shortness of breath has improved.  Feels much improved as he has recovered from his back surgery.  States that the most exertion he does is walking dog and doing yard work.  Denies any exertional chest pain.  Does report some dyspnea with significant exertion, but states that he feels like he can do everything that he wants to do  Past Medical History:  Diagnosis Date  . Acid reflux disease 01/17/2019  . Atrial fibrillation ([place]) 01/17/2019    . BPH (benign prostatic hyperplasia) 01/17/2019  . Degenerative disc disease, lumbar 01/17/2019  . Depression, major, single episode, complete remission ([place]) 01/17/2019  . Diverticulosis 01/17/2019  . DVT (deep venous thrombosis) ([place]) 01/17/2019  . Esophageal cancer ([place])   . GERD (gastroesophageal reflux disease)   . H/O small bowel obstruction 01/17/2019  . Hiatal hernia 01/17/2019    Past Surgical History:  Procedure Laterality Date  . ABDOMINAL HERNIA REPAIR  2012, 2014, 2017   ventral  . ESOPHAGECTOMY  1997   for esophageal cancer  . SPINAL FUSION  2018, 2020   L2 L5 in 2018, L5-S1 2020    Current Medications: Current Meds  Medication Sig  . aspirin EC 81 MG tablet Take 81 mg by mouth daily.  Marland Kitchen diltiazem (CARDIZEM) 120 MG tablet Take 1 tablet (120 mg total) by mouth daily.  . finasteride (PROSCAR) 5 MG tablet Take 1 tablet (5 mg total) by mouth daily.  Marland Kitchen omeprazole (PRILOSEC) 40 MG capsule Take 1 capsule (40 mg total) by mouth 2 (two) times daily.  . propafenone (RYTHMOL) 225 MG tablet TAKE 1 TABLET (225 MG TOTAL) BY MOUTH EVERY 8 HOURS.  Marland Kitchen sucralfate (CARAFATE) 1 g tablet Take 1 tablet (1 g total) by mouth 4 (four) times daily -  with meals and at bedtime.  . tamsulosin (FLOMAX) 0.4 MG CAPS capsule Take 2 capsules (0.8 mg total) by mouth daily.  Marland Kitchen  venlafaxine XR (EFFEXOR-XR) 150 MG 24 hr capsule Take 1 capsule (150 mg total) by mouth daily with breakfast.  . [DISCONTINUED] propafenone (RYTHMOL) 225 MG tablet TAKE 1 TABLET (225 MG TOTAL) BY MOUTH EVERY 8 HOURS.     Allergies:   Mobic [meloxicam] and Statins   Social History   Socioeconomic History  . Marital status: Married    Spouse name: [name]  . Number of children: 2  . Years of education: Not on file  . Highest education level: Not on file  Occupational History  . Occupation: retired    Fish farm manager: [employer]  Tobacco Use  . Smoking status: Never Smoker  . Smokeless tobacco: Never Used  Substance and Sexual  Activity  . Alcohol use: Yes    Alcohol/week: 7.0 standard drinks    Types: 7 Cans of beer per week  . Drug use: Never  . Sexual activity: Not on file  Other Topics Concern  . Not on file  Social History Narrative  . Not on file   Social Determinants of Health   Financial Resource Strain:   . Difficulty of Paying Living Expenses: Not on file  Food Insecurity:   . Worried About Charity fundraiser in the Last Year: Not on file  . Ran Out of Food in the Last Year: Not on file  Transportation Needs:   . Lack of Transportation (Medical): Not on file  . Lack of Transportation (Non-Medical): Not on file  Physical Activity:   . Days of Exercise per Week: Not on file  . Minutes of Exercise per Session: Not on file  Stress:   . Feeling of Stress : Not on file  Social Connections:   . Frequency of Communication with Friends and Family: Not on file  . Frequency of Social Gatherings with Friends and Family: Not on file  . Attends Religious Services: Not on file  . Active Member of Clubs or Organizations: Not on file  . Attends Archivist Meetings: Not on file  . Marital Status: Not on file     Family History: The patient's family history includes Emphysema in his father.  ROS:   Please see the history of present illness.     All other systems reviewed and are negative.  EKGs/Labs/Other Studies Reviewed:    The following studies were reviewed today:   EKG:  EKG is not ordered today.  The ekg ordered 03/18/2019 demonstrates NSR, rate 81, no ST/T changes  Cardiac monitor 04/22/19:  No atrial fibrillation seen. 20 episodes of SVT, longest lasting 11 seconds.   13 days of data recorded on Zio monitor. Patient had a min HR of 59 bpm, max HR of 174 bpm, and avg HR of 87 bpm. Predominant underlying rhythm was Sinus Rhythm. No VT, atrial fibrillation, high degree block, or pauses noted. Isolated atrial and ventricular ectopy was rare (<1%). 20 episodes of of SVT, longest  lasting 11 seconds (rate 144 bpm); fastest was rate 174 bpm, lasting 12 beats.There were 8 triggered events, which corresponded to sinus rhythm and rare PVCs. No significant arrhythmias detected.  TTE 04/02/19:  1. Left ventricular ejection fraction, by visual estimation, is 55 to 60%. The left ventricle has normal function. There is no left ventricular hypertrophy.  2. Left ventricular diastolic Doppler parameters are consistent with pseudonormalization pattern of LV diastolic filling.  3. Global right ventricle has normal systolic function.The right ventricular size is mildly enlarged. No increase in right ventricular wall thickness.  4. Left  atrial size was normal.  5. Right atrial size was normal.  6. The mitral valve is normal in structure. No evidence of mitral valve regurgitation. No evidence of mitral stenosis.  7. The tricuspid valve is normal in structure. Tricuspid valve regurgitation is trivial.  8. The aortic valve is normal in structure. Aortic valve regurgitation was not visualized by color flow Doppler.  9. The pulmonic valve was normal in structure. Pulmonic valve regurgitation is not visualized by color flow Doppler. 10. Normal pulmonary artery systolic pressure. 11. The atrial septum is grossly normal.  Recent Labs: 01/15/2019: ALT 14; BUN 14; Creatinine, Ser 0.80; Hemoglobin 13.4; Platelets 250; Potassium 4.7; Sodium 138  Recent Lipid Panel No results found for: CHOL, TRIG, HDL, CHOLHDL, VLDL, LDLCALC, LDLDIRECT  Physical Exam:    VS:  BP 134/85   Pulse 82   Temp (!) 97.2 F (36.2 C)   Ht 6' 1"  (1.854 m)   Wt 206 lb (93.4 kg)   SpO2 98%   BMI 27.18 kg/m     Wt Readings from Last 3 Encounters:  06/10/19 206 lb (93.4 kg)  04/18/19 204 lb 4 oz (92.6 kg)  03/18/19 206 lb 6.4 oz (93.6 kg)     GEN:  Well nourished, well developed in no acute distress HEENT: Normal NECK: No JVD; No carotid bruits LYMPHATICS: No lymphadenopathy CARDIAC: RRR, no murmurs, rubs,  gallops RESPIRATORY:  Clear to auscultation without rales, wheezing or rhonchi  ABDOMEN: Soft, non-tender, non-distended MUSCULOSKELETAL: 1+ bilateral lower extremity edema; No deformity  SKIN: Warm and dry NEUROLOGIC:  Alert and oriented x 3 PSYCHIATRIC:  Normal affect   ASSESSMENT:    1. Lower extremity edema   2. PAF (paroxysmal atrial fibrillation) ([place])   3. SVT (supraventricular tachycardia) ([place])   4. DOE (dyspnea on exertion)   5. Screening cholesterol level    PLAN:    In order of problems listed above:  Atrial fibrillation: paroxysmal, on propafenone 225 mg 3 times daily and diltiazem 120 mg daily.  CHADS-VASc 1 given age.  Cardiac monitor 04/22/2019 shows no evidence of recurrent A. fib - Discussed with patient that with CHADS-VASc 1 could consider anticoagulation but he prefers to hold off for now - Continue propafenone and diltiazem.  No structural heart disease on TTE 04/02/2019  SVT: Having intermittent episodes on monitor, longest lasting up to 11 seconds.  Reports palpitations have resolved.  Continue diltiazem and propafenone as above  Dyspnea: Suspect was related to deconditioning as he recovered from recent back surgery, has improved.  Diastolic dysfunction on TTE could be contributing, will check BNP  Lower extremity edema: 1+ lower extremity edema on exam.  Will check C met, BNP  Cardiovascular risk assessment: No recent lipid panel, will check lipids today  RTC in 6 months  Medication Adjustments/Labs and Tests Ordered: Current medicines are reviewed at length with the patient today.  Concerns regarding medicines are outlined above.  Orders Placed This Encounter  Procedures  . Brain natriuretic peptide  . Lipid panel  . Comprehensive metabolic panel   Meds ordered this encounter  Medications  . propafenone (RYTHMOL) 225 MG tablet    Sig: TAKE 1 TABLET (225 MG TOTAL) BY MOUTH EVERY 8 HOURS.    Dispense:  180 tablet    Refill:  3    Patient  Instructions  Medication Instructions:  No changes   Lab Work: BNP LIPID CMP  If you have labs (blood work) drawn today and your tests are completely normal, you  will receive your results only by: Marland Kitchen MyChart Message (if you have MyChart) OR . A paper copy in the mail If you have any lab test that is abnormal or we need to change your treatment, we will call you to review the results.  Testing/Procedures: Not needed  Follow-Up: At [facility], you and your health needs are our priority.  As part of our continuing mission to provide you with exceptional heart care, we have created designated Provider Care Teams.  These Care Teams include your primary Cardiologist (physician) and Advanced Practice Providers (APPs -  Physician Assistants and Nurse Practitioners) who all work together to provide you with the care you need, when you need it.  Your next appointment:   6 month(s)  The format for your next appointment:   In Person  Provider:   Oswaldo Milian, MD  Other Instructions WILL [facility] OFFICE    Signed, Donato Heinz, MD  06/10/2019 2:43 PM    [place]

## 2019-06-10 ENCOUNTER — Other Ambulatory Visit: Payer: Self-pay

## 2019-06-10 ENCOUNTER — Ambulatory Visit (INDEPENDENT_AMBULATORY_CARE_PROVIDER_SITE_OTHER): Payer: Medicare HMO | Admitting: Cardiology

## 2019-06-10 VITALS — BP 134/85 | HR 82 | Temp 97.2°F | Ht 73.0 in | Wt 206.0 lb

## 2019-06-10 DIAGNOSIS — Z13220 Encounter for screening for lipoid disorders: Secondary | ICD-10-CM | POA: Diagnosis not present

## 2019-06-10 DIAGNOSIS — R0600 Dyspnea, unspecified: Secondary | ICD-10-CM | POA: Diagnosis not present

## 2019-06-10 DIAGNOSIS — R0609 Other forms of dyspnea: Secondary | ICD-10-CM

## 2019-06-10 DIAGNOSIS — I480 Paroxysmal atrial fibrillation: Secondary | ICD-10-CM

## 2019-06-10 DIAGNOSIS — I471 Supraventricular tachycardia: Secondary | ICD-10-CM | POA: Diagnosis not present

## 2019-06-10 DIAGNOSIS — R600 Localized edema: Secondary | ICD-10-CM | POA: Diagnosis not present

## 2019-06-10 MED ORDER — PROPAFENONE HCL 225 MG PO TABS: ORAL_TABLET | ORAL | 3 refills | Status: DC

## 2019-06-10 NOTE — Patient Instructions (Signed)
Medication Instructions:  No changes   Lab Work: BNP LIPID CMP  If you have labs (blood work) drawn today and your tests are completely normal, you will receive your results only by: Marland Kitchen MyChart Message (if you have MyChart) OR . A paper copy in the mail If you have any lab test that is abnormal or we need to change your treatment, we will call you to review the results.  Testing/Procedures: Not needed  Follow-Up: At [facility], you and your health needs are our priority.  As part of our continuing mission to provide you with exceptional heart care, we have created designated Provider Care Teams.  These Care Teams include your primary Cardiologist (physician) and Advanced Practice Providers (APPs -  Physician Assistants and Nurse Practitioners) who all work together to provide you with the care you need, when you need it.  Your next appointment:   6 month(s)  The format for your next appointment:   In Person  Provider:   Oswaldo Milian, MD  Other Instructions WILL OBTAIN RECORDS FROM [facility] OFFICE

## 2019-06-11 LAB — COMPREHENSIVE METABOLIC PANEL
ALT: 12 IU/L (ref 0–44)
AST: 15 IU/L (ref 0–40)
Albumin/Globulin Ratio: 1.4 (ref 1.2–2.2)
Albumin: 4.1 g/dL (ref 3.7–4.7)
Alkaline Phosphatase: 140 IU/L — ABNORMAL HIGH (ref 39–117)
BUN/Creatinine Ratio: 14 (ref 10–24)
BUN: 13 mg/dL (ref 8–27)
Bilirubin Total: 0.4 mg/dL (ref 0.0–1.2)
CO2: 24 mmol/L (ref 20–29)
Calcium: 10.1 mg/dL (ref 8.6–10.2)
Chloride: 100 mmol/L (ref 96–106)
Creatinine, Ser: 0.92 mg/dL (ref 0.76–1.27)
GFR calc Af Amer: 95 mL/min/{1.73_m2} (ref >59)
GFR calc non Af Amer: 82 mL/min/{1.73_m2} (ref >59)
Globulin, Total: 2.9 g/dL (ref 1.5–4.5)
Glucose: 126 mg/dL — ABNORMAL HIGH (ref 65–99)
Potassium: 4.3 mmol/L (ref 3.5–5.2)
Sodium: 138 mmol/L (ref 134–144)
Total Protein: 7 g/dL (ref 6.0–8.5)

## 2019-06-11 LAB — LIPID PANEL
Chol/HDL Ratio: 3.1 ratio (ref 0.0–5.0)
Cholesterol, Total: 212 mg/dL — ABNORMAL HIGH (ref 100–199)
HDL: 69 mg/dL (ref >39)
LDL Chol Calc (NIH): 101 mg/dL — ABNORMAL HIGH (ref 0–99)
Triglycerides: 252 mg/dL — ABNORMAL HIGH (ref 0–149)
VLDL Cholesterol Cal: 42 mg/dL — ABNORMAL HIGH (ref 5–40)

## 2019-06-11 LAB — BRAIN NATRIURETIC PEPTIDE: BNP: 20.7 pg/mL (ref 0.0–100.0)

## 2019-06-12 ENCOUNTER — Telehealth: Payer: Self-pay | Admitting: Cardiology

## 2019-06-12 MED ORDER — ATORVASTATIN CALCIUM 40 MG PO TABS: 40.0000 mg | ORAL_TABLET | Freq: Every day | ORAL | 11 refills | Status: DC

## 2019-06-12 NOTE — Telephone Encounter (Signed)
New Message ° ° °Pt is returning call for lab results  ° ° °Please call back  °

## 2019-06-12 NOTE — Telephone Encounter (Signed)
Thomas Heinz, MD  06/12/2019 8:57 AM EST    Normal BNP, suggesting that there is not extra fluid on him right now. His cholesterol numbers are elevated, and his 10 year ASCVD risk score is 21% (estimate of 10 year risk of having a cardiovascular event such as heart attack or stroke), which is in the high risk category. Recommend starting on atorvastatin 40 mg daily   Pt notified and voiced understanding

## 2019-06-17 NOTE — Telephone Encounter (Signed)
Spoke with pt who state he has tried a statin in the past and it caused muscle aches. He report he tried Atorvastatin as recommended by MD, but after 3 days he developed same symptoms. He report he has since stopped medication and feel much better but would like to make MD aware.

## 2019-07-01 DIAGNOSIS — M545 Low back pain: Secondary | ICD-10-CM | POA: Diagnosis not present

## 2019-07-01 DIAGNOSIS — R030 Elevated blood-pressure reading, without diagnosis of hypertension: Secondary | ICD-10-CM | POA: Diagnosis not present

## 2019-07-01 DIAGNOSIS — Z6827 Body mass index (BMI) 27.0-27.9, adult: Secondary | ICD-10-CM | POA: Diagnosis not present

## 2019-07-11 ENCOUNTER — Encounter: Payer: Self-pay | Admitting: Family Medicine

## 2019-07-15 DIAGNOSIS — L821 Other seborrheic keratosis: Secondary | ICD-10-CM | POA: Diagnosis not present

## 2019-07-15 DIAGNOSIS — L281 Prurigo nodularis: Secondary | ICD-10-CM | POA: Diagnosis not present

## 2019-07-18 ENCOUNTER — Other Ambulatory Visit: Payer: Self-pay | Admitting: Family Medicine

## 2019-07-18 MED ORDER — TRAMADOL HCL 50 MG PO TABS: 50.0000 mg | ORAL_TABLET | Freq: Two times a day (BID) | ORAL | 2 refills | Status: DC | PRN

## 2019-08-13 ENCOUNTER — Encounter: Payer: Self-pay | Admitting: Family Medicine

## 2019-08-20 ENCOUNTER — Other Ambulatory Visit: Payer: Self-pay | Admitting: Family Medicine

## 2019-08-24 ENCOUNTER — Encounter: Payer: Self-pay | Admitting: Family Medicine

## 2019-09-02 ENCOUNTER — Ambulatory Visit (INDEPENDENT_AMBULATORY_CARE_PROVIDER_SITE_OTHER): Payer: Medicare HMO | Admitting: Family Medicine

## 2019-09-02 ENCOUNTER — Other Ambulatory Visit: Payer: Self-pay

## 2019-09-02 ENCOUNTER — Ambulatory Visit (HOSPITAL_COMMUNITY)
Admission: RE | Admit: 2019-09-02 | Discharge: 2019-09-02 | Disposition: A | Discharge status: Home / Self Care | Payer: Medicare HMO | Source: Ambulatory Visit | Attending: Family Medicine | Admitting: Family Medicine

## 2019-09-02 ENCOUNTER — Encounter: Payer: Self-pay | Admitting: Family Medicine

## 2019-09-02 VITALS — BP 132/84 | HR 106 | Wt 204.8 lb

## 2019-09-02 DIAGNOSIS — R0600 Dyspnea, unspecified: Secondary | ICD-10-CM | POA: Insufficient documentation

## 2019-09-02 DIAGNOSIS — Z0000 Encounter for general adult medical examination without abnormal findings: Secondary | ICD-10-CM

## 2019-09-02 DIAGNOSIS — N401 Enlarged prostate with lower urinary tract symptoms: Secondary | ICD-10-CM | POA: Diagnosis not present

## 2019-09-02 DIAGNOSIS — R3911 Hesitancy of micturition: Secondary | ICD-10-CM | POA: Diagnosis not present

## 2019-09-02 DIAGNOSIS — R0609 Other forms of dyspnea: Secondary | ICD-10-CM

## 2019-09-02 DIAGNOSIS — M5136 Other intervertebral disc degeneration, lumbar region: Secondary | ICD-10-CM | POA: Diagnosis not present

## 2019-09-02 DIAGNOSIS — E782 Mixed hyperlipidemia: Secondary | ICD-10-CM

## 2019-09-02 DIAGNOSIS — J189 Pneumonia, unspecified organism: Secondary | ICD-10-CM | POA: Diagnosis not present

## 2019-09-02 MED ORDER — METHOCARBAMOL 750 MG PO TABS: 750.0000 mg | ORAL_TABLET | Freq: Two times a day (BID) | ORAL | 4 refills | Status: DC | PRN

## 2019-09-02 NOTE — Progress Notes (Signed)
    SUBJECTIVE:   CHIEF COMPLAINT / HPI:   SOB: started noticing it 3 years ago during a stress test. CT showed ground glass opacities and was sent to pulmonary doctor. It was determined these opacities were due to the hiatal hernia and aspiration.  Has an elevated bed at home.  Has had 1 or 2 minor episodes of regurgitation, but is well controlled on ppi. SOB described as dypsnea on exertion, not at est Can walk a block around the neighborhood but has to slow down since December. He could previously walk 1-2 blocks around the neighborhood. No cough, no chest pain.    BPH: double dose of tamsulosin is improving and swelling is better.  Improved urination.  Urgency has improved.  Not taking spironolactone.   Cholesterol: prescribed by cardiologist in December.  Had terrible muscle weakness/soreness. Also had this issue with a previous statin. Currently not taking it.    Degenerative disc disease: prescribed methocarbamol. 750mg  twice a day by  The neurosurgeon who told him his pcp would prescribe refills.  He currently needs refills on this medication.    PERTINENT  PMH / PSH: degenerative disc disease, BPH.    OBJECTIVE:   BP 132/84   Pulse (!) 106   Wt 204 lb 12.8 oz (92.9 kg)   SpO2 97%   BMI 27.02 kg/m   Gen: alert and oriented.  No acute distress.   CV: regular rhythm. Normal rate.  2+ radial pulse b/l.   Pulm: .  Decreased air movement over lower right lobe. No wheezes or crackles.  No tachypnea. No respiratory distress.  Ext: no pedal edema.     ASSESSMENT/PLAN:   Dyspnea on exertion Endorses some underlying chronic SOB, but this appears to have worsened since December.  appearss to be exertional only.  Has recent cardiac workup that did not show any heart failure.  States he had a CT in the past that showed ground glass opacities which were worked up by a pulmonologist in [place].  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 DDx includes:  malignancy, interstitial lung disease, pneumonitis,  Anemia.   - CXR - refer to pulm  - cbc, cmp  Degenerative disc disease, lumbar Prescribed robaxin 750mg  BID by neurosurgeon.  Needs refill.  Taking as prescribed - refill medication  BPH (benign prostatic hyperplasia) Symptoms well controlled on tamsulosin 0.8mg  daily - continue current medication  Hyperlipidemia On statin for primary prevention. ASCVD risk is ~21%.  Unable to tolerate statins prescribed by cardiologist.  LDL 101 in December. Encouraged diet control by reducing saturated fat intake. Given his age of 78, may be best to not continue with trying alternative medications, but will defer to cardiologist who prescribed statin if they want to try any alternatives such as ezetimibe or evolocumab.        Benay Pike, MD [place]

## 2019-09-02 NOTE — Patient Instructions (Addendum)
I have put in a referral to the pulmonologist for further workup of your dyspnea.  If you haven't heard from anybody in two weeks regarding this referral please call our office to let us know.  I have also ordered a chest xray in the meantime.  I will call you with the results of this and your blood tests when I get them.   I have refilled your methocarbamol.   Please let me know if you need anything else.    Have a great day,   Clemetine Marker, MD

## 2019-09-03 ENCOUNTER — Other Ambulatory Visit: Payer: Self-pay | Admitting: Cardiology

## 2019-09-03 LAB — COMPREHENSIVE METABOLIC PANEL
ALT: 15 IU/L (ref 0–44)
AST: 12 IU/L (ref 0–40)
Albumin/Globulin Ratio: 1.8 (ref 1.2–2.2)
Albumin: 4.2 g/dL (ref 3.7–4.7)
Alkaline Phosphatase: 142 IU/L — ABNORMAL HIGH (ref 39–117)
BUN/Creatinine Ratio: 12 (ref 10–24)
BUN: 12 mg/dL (ref 8–27)
Bilirubin Total: <0.2 mg/dL (ref 0.0–1.2)
CO2: 24 mmol/L (ref 20–29)
Calcium: 12.2 mg/dL — ABNORMAL HIGH (ref 8.6–10.2)
Chloride: 105 mmol/L (ref 96–106)
Creatinine, Ser: 1.01 mg/dL (ref 0.76–1.27)
GFR calc Af Amer: 85 mL/min/{1.73_m2} (ref >59)
GFR calc non Af Amer: 73 mL/min/{1.73_m2} (ref >59)
Globulin, Total: 2.4 g/dL (ref 1.5–4.5)
Glucose: 109 mg/dL — ABNORMAL HIGH (ref 65–99)
Potassium: 4.4 mmol/L (ref 3.5–5.2)
Sodium: 140 mmol/L (ref 134–144)
Total Protein: 6.6 g/dL (ref 6.0–8.5)

## 2019-09-03 LAB — CBC
Hematocrit: 47.4 % (ref 37.5–51.0)
Hemoglobin: 16.2 g/dL (ref 13.0–17.7)
MCH: 32.5 pg (ref 26.6–33.0)
MCHC: 34.2 g/dL (ref 31.5–35.7)
MCV: 95 fL (ref 79–97)
Platelets: 210 10*3/uL (ref 150–450)
RBC: 4.99 x10E6/uL (ref 4.14–5.80)
RDW: 12.8 % (ref 11.6–15.4)
WBC: 6.6 10*3/uL (ref 3.4–10.8)

## 2019-09-03 LAB — HEPATITIS C ANTIBODY: Hep C Virus Ab: <0.1 s/co ratio (ref 0.0–0.9)

## 2019-09-04 ENCOUNTER — Telehealth: Payer: Self-pay

## 2019-09-04 NOTE — Telephone Encounter (Signed)
Patient left message on nurse line regarding talking with provider about results of CXR. Patient states that he has viewed the result in myChart but would like to discuss with provider what the next steps are.   To PCP  Talbot Grumbling, RN

## 2019-09-05 ENCOUNTER — Other Ambulatory Visit: Payer: Self-pay | Admitting: Family Medicine

## 2019-09-05 ENCOUNTER — Other Ambulatory Visit: Payer: Self-pay

## 2019-09-05 ENCOUNTER — Other Ambulatory Visit: Payer: Medicare HMO

## 2019-09-05 DIAGNOSIS — E785 Hyperlipidemia, unspecified: Secondary | ICD-10-CM | POA: Insufficient documentation

## 2019-09-05 DIAGNOSIS — R0600 Dyspnea, unspecified: Secondary | ICD-10-CM | POA: Insufficient documentation

## 2019-09-05 DIAGNOSIS — R0609 Other forms of dyspnea: Secondary | ICD-10-CM | POA: Insufficient documentation

## 2019-09-05 NOTE — Assessment & Plan Note (Signed)
Symptoms well controlled on tamsulosin 0.8mg  daily - continue current medication

## 2019-09-05 NOTE — Assessment & Plan Note (Addendum)
On statin for primary prevention. ASCVD risk is ~21%.  Unable to tolerate statins prescribed by cardiologist.  LDL 101 in December. Encouraged diet control by reducing saturated fat intake. Given his age of 48, may be best to not continue with trying alternative medications, but will defer to cardiologist who prescribed statin if they want to try any alternatives such as ezetimibe or evolocumab.

## 2019-09-05 NOTE — Assessment & Plan Note (Addendum)
Endorses some underlying chronic SOB, but this appears to have worsened since December.  appearss to be exertional only.  Has recent cardiac workup that did not show any heart failure.  States he had a CT in the past that showed ground glass opacities which were worked up by a pulmonologist in [place].  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 DDx includes: malignancy, interstitial lung disease, pneumonitis,  Anemia.   - CXR - refer to pulm  - cbc, cmp

## 2019-09-05 NOTE — Telephone Encounter (Signed)
Spoke with patient about results.

## 2019-09-05 NOTE — Assessment & Plan Note (Signed)
Prescribed robaxin 750mg  BID by neurosurgeon.  Needs refill.  Taking as prescribed - refill medication

## 2019-09-06 LAB — BASIC METABOLIC PANEL
BUN/Creatinine Ratio: 13 (ref 10–24)
BUN: 12 mg/dL (ref 8–27)
CO2: 23 mmol/L (ref 20–29)
Calcium: 12.1 mg/dL — ABNORMAL HIGH (ref 8.6–10.2)
Chloride: 106 mmol/L (ref 96–106)
Creatinine, Ser: 0.96 mg/dL (ref 0.76–1.27)
GFR calc Af Amer: 90 mL/min/{1.73_m2} (ref >59)
GFR calc non Af Amer: 78 mL/min/{1.73_m2} (ref >59)
Glucose: 117 mg/dL — ABNORMAL HIGH (ref 65–99)
Potassium: 4.7 mmol/L (ref 3.5–5.2)
Sodium: 142 mmol/L (ref 134–144)

## 2019-09-06 LAB — CALCIUM, IONIZED: Calcium, Ion: 6.4 mg/dL — ABNORMAL HIGH (ref 4.5–5.6)

## 2019-09-10 ENCOUNTER — Other Ambulatory Visit: Payer: Self-pay | Admitting: Family Medicine

## 2019-09-10 NOTE — Progress Notes (Signed)
Ordering pth and other labs after repeat labs confirmed hypercalcemia.    [facility] team, can you call this pt to let him know he can come at any time to get labs done for further workup of his hypercalcemia?

## 2019-09-11 ENCOUNTER — Other Ambulatory Visit: Payer: Self-pay

## 2019-09-11 ENCOUNTER — Encounter: Payer: Self-pay | Admitting: Internal Medicine

## 2019-09-11 ENCOUNTER — Ambulatory Visit: Payer: Medicare HMO | Admitting: Internal Medicine

## 2019-09-11 VITALS — BP 122/86 | HR 82 | Ht 72.0 in | Wt 209.0 lb

## 2019-09-11 DIAGNOSIS — R0600 Dyspnea, unspecified: Secondary | ICD-10-CM | POA: Diagnosis not present

## 2019-09-11 DIAGNOSIS — R0609 Other forms of dyspnea: Secondary | ICD-10-CM

## 2019-09-11 NOTE — Progress Notes (Signed)
Thomas Dominguez    [id number]    1946/06/06  Primary Care Physician:Olson, Garen Lah, MD  Referring Physician: Benay Pike, MD [address],  [place] [address] Reason for Consultation: "dyspnea on exertion" Date of Consultation: 09/11/2019  Chief complaint:   Chief Complaint  Patient presents with  . Pulmonary Consult     HPI: Worsening shortness of breath over the last year. Now has difficulty walking for long periods.  Occasional palpitations - got holter monitor. Does have rate controlled A. Fib. Spends most of the time in NSR, can tell when he is having A. Fib.  Has wheezing and  occasional cough - dry.  Had Barrett's Esophagus and dysplasia and had partial esophagectomy with gastric pull through in 1997.  Saw a pulmonologist previously and was told he had ground glass opacities in his lungs.(5 years ago) He does have a hiatal hernia which caused some dextrocardia. Recurrent ventral hernia. Has recurrent aspiration and sleeps with an elevated head of bed which improved his symptoms.  Concerned that now his breathing is worsening to the point where he doesn't know how far he can walk when he goes out with his wife in the evening. Taking two flights of stairs very difficult. No fevers, chills, night sweats.   Social history:  Occupation: retired from [employer].  Smoking history: never smoker  Social History   Occupational History  . Occupation: retired    Fish farm manager: [employer]  Tobacco Use  . Smoking status: Never Smoker  . Smokeless tobacco: Never Used  Substance and Sexual Activity  . Alcohol use: Yes    Alcohol/week: 7.0 standard drinks    Types: 7 Cans of beer per week  . Drug use: Never  . Sexual activity: Not on file    Relevant family history:  Family History  Problem Relation Age of Onset  . Emphysema Father     Past Medical History:  Diagnosis Date  . Acid reflux disease 01/17/2019  . Atrial fibrillation ([place])  01/17/2019  . BPH (benign prostatic hyperplasia) 01/17/2019  . Degenerative disc disease, lumbar 01/17/2019  . Depression, major, single episode, complete remission ([place]) 01/17/2019  . Diverticulosis 01/17/2019  . DVT (deep venous thrombosis) ([place]) 01/17/2019  . Esophageal cancer ([place])   . GERD (gastroesophageal reflux disease)   . H/O small bowel obstruction 01/17/2019  . Hiatal hernia 01/17/2019    Past Surgical History:  Procedure Laterality Date  . ABDOMINAL HERNIA REPAIR  2012, 2014, 2017   ventral  . ESOPHAGECTOMY  1997   for esophageal cancer  . SPINAL FUSION  2018, 2020   L2 L5 in 2018, L5-S1 2020     Review of systems: Review of Systems  Constitutional: Negative for chills, fever and weight loss.  HENT: Negative for congestion, sinus pain and sore throat.   Eyes: Negative for discharge and redness.  Respiratory: Positive for cough and shortness of breath. Negative for hemoptysis, sputum production and wheezing.   Cardiovascular: Negative for chest pain, palpitations and leg swelling.  Gastrointestinal: Positive for heartburn. Negative for nausea and vomiting.  Musculoskeletal: Negative for joint pain and myalgias.  Skin: Negative for rash.  Neurological: Negative for dizziness, tremors, focal weakness and headaches.  Endo/Heme/Allergies: Negative for environmental allergies.  Psychiatric/Behavioral: Negative for depression. The patient is not nervous/anxious.   All other systems reviewed and are negative.   Physical Exam: Blood pressure 122/86, pulse 82, height 6' (1.829 m), weight 209 lb (  94.8 kg), SpO2 99 %. Gen:      No acute distress ENT:  no nasal polyps, mucus membranes moist Lungs:    No increased respiratory effort, symmetric chest wall excursion, clear to auscultation bilaterally, no wheezes or crackles CV:         Regular rate and rhythm; no murmurs, rubs, or gallops.  No pedal edema Neuro: normal speech, no focal facial asymmetry Psych: alert and oriented x3,  normal mood and affect   Data Reviewed/Medical Decision Making:  Independent interpretation of tests: Imaging: . Review of patient's chest xray images revealed dextrocardia. The patient's images have been independently reviewed by me.  No pleural effusions.   PFTs: Never had  Labs:  Lab Results  Component Value Date   WBC 6.6 09/02/2019   HGB 16.2 09/02/2019   HCT 47.4 09/02/2019   MCV 95 09/02/2019   PLT 210 09/02/2019   Lab Results  Component Value Date   NA 142 09/05/2019   K 4.7 09/05/2019   CL 106 09/05/2019   CO2 23 09/05/2019     Immunization status:  Immunization History  Administered Date(s) Administered  . Fluad Quad(high Dose 65+) 04/23/2019  . Influenza Inj Mdck Quad Pf 07/04/2017, 06/11/2018  . Influenza, Quadrivalent, Recombinant, Inj, Pf 04/21/2019  . Influenza-Unspecified 04/28/2019    . I reviewed prior external note(s) from Dr. Jeannine Kitten, Dr. Gardiner Rhyme . I reviewed the result(s) of the labs and imaging as noted above.  . 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  Barrett's Esophagus s/p esophagectomy   Plan/Recommendations: Dyspnea on exertion multifactorial: consideration of chest wall restriction vs ILD. No crackles on exam today, this is less likely. Will obtain PFTs, obtain a CT Chest to further evaluate dextrocardia, diaphragmatic insufficiencies from previous surgeries. He is already following with cardiology  Follow up after PFTs   Return in about 10 weeks (around 11/20/2019).  [name], MD Pulmonary and [place]  CC: Benay Pike, MD

## 2019-09-11 NOTE — Progress Notes (Signed)
Called pt LVM telling pt to come in to the office at his convince to get some lab work done. Ottis Stain, CMA

## 2019-09-11 NOTE — Patient Instructions (Addendum)
The patient should have follow up scheduled after PFTs:  Full set of PFTs - spirometry, DLCO, lung volumes CT CHEST

## 2019-09-12 ENCOUNTER — Other Ambulatory Visit: Payer: Self-pay

## 2019-09-12 ENCOUNTER — Other Ambulatory Visit: Payer: Medicare HMO

## 2019-09-13 LAB — SPECIMEN STATUS

## 2019-09-15 ENCOUNTER — Other Ambulatory Visit: Payer: Self-pay | Admitting: Family Medicine

## 2019-09-15 LAB — PTH, INTACT AND CALCIUM: Calcium: 11.5 mg/dL — ABNORMAL HIGH (ref 8.6–10.2)

## 2019-09-15 NOTE — Progress Notes (Signed)
Pt called. Pt will come in at some point this week and have the labs redrawn. Ottis Stain, CMA

## 2019-09-16 ENCOUNTER — Other Ambulatory Visit: Payer: Self-pay

## 2019-09-16 ENCOUNTER — Other Ambulatory Visit: Payer: Medicare HMO

## 2019-09-17 ENCOUNTER — Other Ambulatory Visit: Payer: Self-pay | Admitting: Family Medicine

## 2019-09-17 DIAGNOSIS — E210 Primary hyperparathyroidism: Secondary | ICD-10-CM

## 2019-09-17 LAB — PTH, INTACT AND CALCIUM
Calcium: 11.5 mg/dL — ABNORMAL HIGH (ref 8.6–10.2)
PTH: 128 pg/mL — ABNORMAL HIGH (ref 15–65)

## 2019-09-17 NOTE — Progress Notes (Signed)
Spoke with pt over the phone regarding his new diagnosis of primary hPTH. He is okay with the following referrals to endocrinology and gen surg.

## 2019-09-22 ENCOUNTER — Ambulatory Visit (INDEPENDENT_AMBULATORY_CARE_PROVIDER_SITE_OTHER)
Admission: RE | Admit: 2019-09-22 | Discharge: 2019-09-22 | Disposition: A | Discharge status: Home / Self Care | Payer: Medicare HMO | Source: Ambulatory Visit | Attending: Internal Medicine | Admitting: Internal Medicine

## 2019-09-22 ENCOUNTER — Other Ambulatory Visit: Payer: Self-pay

## 2019-09-22 DIAGNOSIS — R0600 Dyspnea, unspecified: Secondary | ICD-10-CM | POA: Diagnosis not present

## 2019-09-22 DIAGNOSIS — R0609 Other forms of dyspnea: Secondary | ICD-10-CM

## 2019-09-22 DIAGNOSIS — J9811 Atelectasis: Secondary | ICD-10-CM | POA: Diagnosis not present

## 2019-09-24 ENCOUNTER — Encounter: Payer: Self-pay | Admitting: Family Medicine

## 2019-09-25 NOTE — Telephone Encounter (Signed)
Dr. Shearon Stalls please review CT scan.

## 2019-09-27 NOTE — Telephone Encounter (Signed)
CT scan reviewed - most consistent with infection. Do not see any evidence of scarring related to interstitial lung disease. Would proceed with a trial of empiric antibiotic therapy for 4 weeks with repeat imaging vs bronchoscopy to identify specific bacteria.  Called patient to communicate these results but had to leave a voice mail.

## 2019-09-28 DIAGNOSIS — J181 Lobar pneumonia, unspecified organism: Secondary | ICD-10-CM

## 2019-09-29 MED ORDER — AMOXICILLIN-POT CLAVULANATE 875-125 MG PO TABS: 1.0000 | ORAL_TABLET | Freq: Two times a day (BID) | ORAL | 0 refills | Status: AC

## 2019-09-29 NOTE — Telephone Encounter (Signed)
Augmentin x 4 weeks sent to patient's pharmacy. Repeat CT scan ordered for 3 months (end of June.) He needs to have follow up with APP at that time to discuss results.

## 2019-09-30 ENCOUNTER — Other Ambulatory Visit: Payer: Self-pay

## 2019-09-30 ENCOUNTER — Encounter: Payer: Self-pay | Admitting: Family Medicine

## 2019-09-30 ENCOUNTER — Ambulatory Visit (INDEPENDENT_AMBULATORY_CARE_PROVIDER_SITE_OTHER): Payer: Medicare HMO | Admitting: Family Medicine

## 2019-09-30 VITALS — BP 102/70 | HR 74 | Ht 72.0 in | Wt 208.2 lb

## 2019-09-30 DIAGNOSIS — G4452 New daily persistent headache (NDPH): Secondary | ICD-10-CM | POA: Diagnosis not present

## 2019-09-30 DIAGNOSIS — R278 Other lack of coordination: Secondary | ICD-10-CM | POA: Insufficient documentation

## 2019-09-30 DIAGNOSIS — R0600 Dyspnea, unspecified: Secondary | ICD-10-CM | POA: Diagnosis not present

## 2019-09-30 DIAGNOSIS — R0609 Other forms of dyspnea: Secondary | ICD-10-CM

## 2019-09-30 DIAGNOSIS — E210 Primary hyperparathyroidism: Secondary | ICD-10-CM | POA: Diagnosis not present

## 2019-09-30 HISTORY — DX: Other lack of coordination: R27.8

## 2019-09-30 HISTORY — DX: New daily persistent headache (ndph): G44.52

## 2019-09-30 NOTE — Progress Notes (Signed)
    SUBJECTIVE:   CHIEF COMPLAINT / HPI:   Daily headaches: happening every other day, no specific cause or time of day.  Always frontal and radiating backwards.  Vary in intensity. Describes it as a dull ache.  He takes tylenol and it helps most of the time.  Sometimes they last 10 minute to half an hour.  Never gets woken up by headache.  No blurry vision.  Has nausea occasionally but no vomiting.  Takes sucralfate and this relieves his nausea.   Balance: if he gets up from standing, the first couple steps are off balance. This occurs about half the time he stands up.  This is new within the last month.  Hasn't worsened in that time.  Doesn't feel lightheaded, no room spinning sensation.  Legs feel a little 'weaker'.     Hypercalcemia: has an appointment with the endocrinologist this month. Hasn't heard from surgeon yet.   SOB: hasn't improved at all. only started taking azithromycin last night.  Has a daily 'dry cough' which usually happens at night.      PERTINENT  PMH / PSH: hiatal hernia, primary hyperparathyroidism  OBJECTIVE:   BP 102/70   Pulse 74   Ht 6' (1.829 m)   Wt 208 lb 3.2 oz (94.4 kg)   SpO2 98%   BMI 28.24 kg/m   Gen: alert, oriented. No acute distress.  Appears stated age.   CV: RRR. No murmurs.  Pulm: LCTAB. No wheezes or crackles.  MSK: strength equal b/l.  Some very slight weakness b/l in the hip flexors and quadriceps muscles b/l.   Neuro: CN 2-12 grossly intact.  Sensation grossly intact.  Patellar tendon and brachial tendon reflexes equal.  Negative finger to nose/heel-to shin test.  Negative romberg.  When arising from chair he was unsteady upon standing.  His gait was slowed but otherwise normal.    ASSESSMENT/PLAN:   Acute ataxia Occurring for past month.  Coincided with onset of headaches.  Mostly experiences symptoms when arising from sitting position but description of symptoms do not resemble orthostasis or vertigo.  This could be secondary to muscle  weakness associated with his hypercalcemia, but we will get an MRI brain to rule out a vertebrobasilar infarct first.     New daily persistent headache Began one month ago along with ataxia. Description not consistent with migraines. More consistent with tension type headaches. Improve with tylenol and are becoming slightly less frequent.  Not concerned with cranial mass/malignancy based on otherwise normal physical exam without vision changes, but MRI will reveal any mass that would be there. Advised pt to continue with tylenol for now.     Primary hyperparathyroidism ([place]) Pt has appt with endocrinology.  States he has not heard from general surgery.  I reached out personally to gen surg again and they stated they will be calling the patient soon to schedule an appointment.     Dyspnea on exertion Has not improved since last visit.  Pulmonologist favors infectious process based on ct imaging.  Only started his azithro last night. I have my doubts about it being an infectious process based on his history but we will see if the abx improves his symptoms.  He does complain of chronic cough but his description seems more consistent with laryngopharyngeal reflux 2/2 hiatal hernia.      Benay Pike, MD [place]

## 2019-09-30 NOTE — Patient Instructions (Signed)
Because your ataxia (balance issues) is acute, we recommend you get a MRI of your brain.  We will schedule this for you.  For your headaches, I would just continue to use Tylenol as needed.  If they start to get much worse or you start to develop other symptoms such as blurry vision, vomiting, then you should let us know or go to the emergency department.  I think your weakness in your legs is related to your hypercalcemia and this should get better once you get that treated.  I will call you with results of your imaging.  Otherwise, I would like to see you back in 6 weeks.  Have a great day,  Clemetine Marker, MD

## 2019-09-30 NOTE — Assessment & Plan Note (Signed)
Pt has appt with endocrinology.  States he has not heard from general surgery.  I reached out personally to gen surg again and they stated they will be calling the patient soon to schedule an appointment.

## 2019-09-30 NOTE — Assessment & Plan Note (Signed)
Occurring for past month.  Coincided with onset of headaches.  Mostly experiences symptoms when arising from sitting position but description of symptoms do not resemble orthostasis or vertigo.  This could be secondary to muscle weakness associated with his hypercalcemia, but we will get an MRI brain to rule out a vertebrobasilar infarct first.

## 2019-09-30 NOTE — Assessment & Plan Note (Signed)
Has not improved since last visit.  Pulmonologist favors infectious process based on ct imaging.  Only started his azithro last night. I have my doubts about it being an infectious process based on his history but we will see if the abx improves his symptoms.  He does complain of chronic cough but his description seems more consistent with laryngopharyngeal reflux 2/2 hiatal hernia.

## 2019-09-30 NOTE — Assessment & Plan Note (Signed)
Began one month ago along with ataxia. Description not consistent with migraines. More consistent with tension type headaches. Improve with tylenol and are becoming slightly less frequent.  Not concerned with cranial mass/malignancy based on otherwise normal physical exam without vision changes, but MRI will reveal any mass that would be there. Advised pt to continue with tylenol for now.

## 2019-10-05 ENCOUNTER — Encounter: Payer: Self-pay | Admitting: Family Medicine

## 2019-10-12 ENCOUNTER — Other Ambulatory Visit: Payer: Medicare HMO

## 2019-10-28 DIAGNOSIS — E210 Primary hyperparathyroidism: Secondary | ICD-10-CM | POA: Diagnosis not present

## 2019-10-29 DIAGNOSIS — E210 Primary hyperparathyroidism: Secondary | ICD-10-CM | POA: Diagnosis not present

## 2019-10-31 DIAGNOSIS — E210 Primary hyperparathyroidism: Secondary | ICD-10-CM | POA: Diagnosis not present

## 2019-11-03 ENCOUNTER — Other Ambulatory Visit: Payer: Self-pay

## 2019-11-05 ENCOUNTER — Encounter: Payer: Self-pay | Admitting: Internal Medicine

## 2019-11-05 ENCOUNTER — Other Ambulatory Visit: Payer: Self-pay

## 2019-11-05 ENCOUNTER — Telehealth: Payer: Self-pay | Admitting: Internal Medicine

## 2019-11-05 ENCOUNTER — Ambulatory Visit: Payer: Medicare HMO | Admitting: Internal Medicine

## 2019-11-05 VITALS — BP 124/78 | HR 78 | Temp 97.9°F | Ht 72.0 in | Wt 202.8 lb

## 2019-11-05 DIAGNOSIS — E210 Primary hyperparathyroidism: Secondary | ICD-10-CM | POA: Diagnosis not present

## 2019-11-05 NOTE — Patient Instructions (Signed)
-   Please stay Hydrated  - AVOID CALCIUM SUPPLEMENTS, AVOID LOW CALCIUM DIET -Maintain normal dietary calcium intake (2-3 servings of dairy a day)

## 2019-11-05 NOTE — Progress Notes (Signed)
Name: Thomas Dominguez. Surowiec  MRN/ DOB: [id number], 1945-06-30    Age/ Sex: 74 y.o., male    PCP: Benay Pike, MD   Reason for Endocrinology Evaluation: Primary hyperparathyroidism     Date of Initial Endocrinology Evaluation: 11/05/2019     HPI: Mr. Thomas Dominguez is a 74 y.o. male with a past medical history of A.Fib and dyslipidemia The patient presented for initial endocrinology clinic visit on 11/05/2019 for consultative assistance with his Primary hyperparathyroidism.     Mr. Thomas Dominguez indicates that he was first diagnosed with hypercalcemia in 08/2019. Since that time, he has experienced symptoms of constipation, polyuria, polydipsia, generalized weakness, diffuse muscle pains, but no significant memory impairment. He uses over the counter calcium MVI , lithium, HCTZ, or vitamin D supplements.   Vitamin D made him tired ~ 2000 iu daily   He denies history of kidney stones, kidney disease, liver disease, granulomatous disease. He denies osteoporosis or prior fractures. Daily dietary calcium intake: 1 servings . He  family history of osteoporosis, parathyroid disease, thyroid disease.    HISTORY:  Past Medical History:  Past Medical History:  Diagnosis Date  . Acid reflux disease 01/17/2019  . Atrial fibrillation ([place]) 01/17/2019  . BPH (benign prostatic hyperplasia) 01/17/2019  . Degenerative disc disease, lumbar 01/17/2019  . Depression, major, single episode, complete remission ([place]) 01/17/2019  . Diverticulosis 01/17/2019  . DVT (deep venous thrombosis) ([place]) 01/17/2019  . Esophageal cancer ([place])   . GERD (gastroesophageal reflux disease)   . H/O small bowel obstruction 01/17/2019  . Hiatal hernia 01/17/2019   Past Surgical History:  Past Surgical History:  Procedure Laterality Date  . ABDOMINAL HERNIA REPAIR  2012, 2014, 2017   ventral  . ESOPHAGECTOMY  1997   for esophageal cancer  . SPINAL FUSION  2018, 2020   L2 L5 in 2018, L5-S1 2020      Social History:  reports  that he has never smoked. He has never used smokeless tobacco. He reports current alcohol use of about 7.0 standard drinks of alcohol per week. He reports that he does not use drugs.  Family History: family history includes Emphysema in his father.   HOME MEDICATIONS: Allergies as of 11/05/2019      Reactions   Mobic [meloxicam]    Statins       Medication List       Accurate as of Nov 05, 2019  9:48 AM. If you have any questions, ask your nurse or doctor.        aspirin EC 81 MG tablet Take 81 mg by mouth daily.   diltiazem 120 MG tablet Commonly known as: CARDIZEM Take 1 tablet (120 mg total) by mouth daily.   methocarbamol 750 MG tablet Commonly known as: ROBAXIN Take 1 tablet (750 mg total) by mouth 2 (two) times daily as needed for muscle spasms.   omeprazole 40 MG capsule Commonly known as: PRILOSEC Take 1 capsule (40 mg total) by mouth 2 (two) times daily.   propafenone 225 MG tablet Commonly known as: RYTHMOL TAKE 1 TABLET (225 MG TOTAL) BY MOUTH EVERY 8 HOURS.   sucralfate 1 g tablet Commonly known as: Carafate Take 1 tablet (1 g total) by mouth 4 (four) times daily -  with meals and at bedtime.   tamsulosin 0.4 MG Caps capsule Commonly known as: FLOMAX TAKE 2 CAPSULES EVERY DAY   venlafaxine XR 150 MG 24 hr capsule Commonly known as: EFFEXOR-XR Take 1 capsule (150 mg  total) by mouth daily with breakfast.         REVIEW OF SYSTEMS: A comprehensive ROS was conducted with the patient and is negative except as per HPI and below:  ROS     OBJECTIVE:  VS: There were no vitals taken for this visit.   Wt Readings from Last 3 Encounters:  09/30/19 208 lb 3.2 oz (94.4 kg)  09/11/19 209 lb (94.8 kg)  09/02/19 204 lb 12.8 oz (92.9 kg)     EXAM: General: Pt appears well and is in NAD  Eyes: External eye exam normal without stare, lid lag or exophthalmos.  EOM intact.    Neck: General: Supple without adenopathy. Thyroid: Thyroid size normal.  No  goiter or nodules appreciated. No thyroid bruit.  Lungs: Clear with good BS bilat with no rales, rhonchi, or wheezes  Heart: Auscultation: RRR.  Abdomen: Normoactive bowel sounds, soft, nontender, without masses or organomegaly palpable  Extremities:  BL LE: No pretibial edema normal ROM and strength.  Skin: Hair: Texture and amount normal with gender appropriate distribution Skin Inspection: No rashes Skin Palpation: Skin temperature, texture, and thickness normal to palpation  Neuro: Cranial nerves: II - XII grossly intact  Motor: Normal strength throughout DTRs: 2+ and symmetric in UE without delay in relaxation phase  Mental Status: Judgment, insight: Intact Orientation: Oriented to time, place, and person Mood and affect: No depression, anxiety, or agitation     DATA REVIEWED: Results for Kawahara, Everton L. "[name]" (MRN [id number]) as of 11/05/2019 11:53  Ref. Range 09/05/2019 11:14 09/12/2019 14:17  Sodium Latest Ref Range: 134 - 144 mmol/L 142   Potassium Latest Ref Range: 3.5 - 5.2 mmol/L 4.7   Chloride Latest Ref Range: 96 - 106 mmol/L 106   CO2 Latest Ref Range: 20 - 29 mmol/L 23   Glucose Latest Ref Range: 65 - 99 mg/dL 117 (H)   BUN Latest Ref Range: 8 - 27 mg/dL 12   Creatinine Latest Ref Range: 0.76 - 1.27 mg/dL 0.96   Calcium Latest Ref Range: 8.6 - 10.2 mg/dL 12.1 (H) 11.5 (H)  BUN/Creatinine Ratio Latest Ref Range: 10 - 24  13   Calcium Ionized Latest Ref Range: 4.5 - 5.6 mg/dL 6.4 (H)   GFR, Est Non African American Latest Ref Range: >59 mL/min/1.73 78   GFR, Est African American Latest Ref Range: >59 mL/min/1.73 90     Results for Armentor, Ramil L. "[name]" (MRN [id number]) as of 11/05/2019 11:53  Ref. Range 09/16/2019 12:13  PTH, Intact Latest Ref Range: 15 - 65 pg/mL 128 (H)    ASSESSMENT/PLAN/RECOMMENDATIONS:   1. Hypercalcemia :  - PTH- mediated , most likely Primary hyperparathyroidism - He has already met with Dr. [name] last week for surgical evaluation,  he had vitamin D and 24-hr urine collection performed ( results not available yet)  - Criteria for surgical intervention (Serum calcium concentration of 1.0 mg/dL or more above the upper limit of normal,Estimated glomerular filtration rate (eGFR) <60 mL/min, evidence of Osteoporosis on bone density, Twenty-four-hour urinary calcium >300 mg/day ,Nephrolithiasis or nephrocalcinosis by radiograph, Age less than 50 years.)  - Encouraged hydration  - AVOID CALCIUM SUPPLEMENTS, AVOID LOW CALCIUM DIET - Maintain normal dietary calcium intake (2-3 servings of dairy a day) -  Will proceed with DXA to rule out Osteoporosis    F/U in 4 months     Signed electronically by: Mack Guise, MD  [facility] Endocrinology  [facility] [address]  [place], [place] [address] Phone: [phone] FAX: [phone]   CC: Benay Pike, MD [address] [place] [address] Phone: [phone] Fax: [phone]   Return to Endocrinology clinic as below: Future Appointments  Date Time Provider [place]  11/05/2019  9:50 AM Chandni Gagan, Melanie Crazier, MD [facility] None  11/28/2019 11:00 AM [facility]-CT 1 LB[facility]-CT LB-CT [facility]  12/09/2019  9:00 AM [facility]-SCREENING [facility]-SDSC None  12/12/2019  1:00 PM [facility] PFT ROOM [facility] None  12/12/2019  2:00 PM Parrett, Fonnie Mu, NP [facility]PUL[facility] None  12/26/2019  9:00 AM Donato Heinz, MD [facility]

## 2019-11-05 NOTE — Telephone Encounter (Signed)
Can you please call Dr. Rayann Heman office and ask for his vitamin D and 24-hr urine collection results ?   Thanks

## 2019-11-05 NOTE — Telephone Encounter (Signed)
Lft vm for medical records to fax info

## 2019-11-06 NOTE — Telephone Encounter (Signed)
Spoke to [place] from medical records and she stated that she does not see a vit D or urine specimen for this pt in their records. She stated that she will check with Dr. Harlow Asa to make sure.

## 2019-11-06 NOTE — Telephone Encounter (Signed)
Jeani Hawking from [facility] called you back - she said her direct line for a call back is [phone]

## 2019-11-10 ENCOUNTER — Other Ambulatory Visit (HOSPITAL_COMMUNITY): Payer: Self-pay | Admitting: Surgery

## 2019-11-10 ENCOUNTER — Other Ambulatory Visit: Payer: Self-pay | Admitting: Surgery

## 2019-11-10 DIAGNOSIS — E210 Primary hyperparathyroidism: Secondary | ICD-10-CM

## 2019-11-17 ENCOUNTER — Other Ambulatory Visit: Payer: Self-pay

## 2019-11-17 ENCOUNTER — Ambulatory Visit (INDEPENDENT_AMBULATORY_CARE_PROVIDER_SITE_OTHER)
Admission: RE | Admit: 2019-11-17 | Discharge: 2019-11-17 | Disposition: A | Discharge status: Home / Self Care | Payer: Medicare HMO | Source: Ambulatory Visit | Attending: Internal Medicine | Admitting: Internal Medicine

## 2019-11-17 ENCOUNTER — Other Ambulatory Visit: Payer: Self-pay | Admitting: Family Medicine

## 2019-11-17 DIAGNOSIS — E210 Primary hyperparathyroidism: Secondary | ICD-10-CM | POA: Diagnosis not present

## 2019-11-18 ENCOUNTER — Encounter: Payer: Self-pay | Admitting: Internal Medicine

## 2019-11-19 ENCOUNTER — Encounter (HOSPITAL_COMMUNITY)
Admission: RE | Admit: 2019-11-19 | Discharge: 2019-11-19 | Disposition: A | Discharge status: Home / Self Care | Payer: Medicare HMO | Source: Ambulatory Visit | Attending: Surgery | Admitting: Surgery

## 2019-11-19 ENCOUNTER — Other Ambulatory Visit: Payer: Self-pay

## 2019-11-19 DIAGNOSIS — K5669 Other partial intestinal obstruction: Secondary | ICD-10-CM | POA: Diagnosis not present

## 2019-11-19 DIAGNOSIS — Z20822 Contact with and (suspected) exposure to covid-19: Secondary | ICD-10-CM | POA: Diagnosis not present

## 2019-11-19 DIAGNOSIS — E210 Primary hyperparathyroidism: Secondary | ICD-10-CM

## 2019-11-19 DIAGNOSIS — N400 Enlarged prostate without lower urinary tract symptoms: Secondary | ICD-10-CM | POA: Diagnosis not present

## 2019-11-19 DIAGNOSIS — Z7982 Long term (current) use of aspirin: Secondary | ICD-10-CM | POA: Diagnosis not present

## 2019-11-19 DIAGNOSIS — Z79899 Other long term (current) drug therapy: Secondary | ICD-10-CM | POA: Diagnosis not present

## 2019-11-19 DIAGNOSIS — I4891 Unspecified atrial fibrillation: Secondary | ICD-10-CM | POA: Diagnosis not present

## 2019-11-19 DIAGNOSIS — E213 Hyperparathyroidism, unspecified: Secondary | ICD-10-CM | POA: Diagnosis not present

## 2019-11-19 DIAGNOSIS — K219 Gastro-esophageal reflux disease without esophagitis: Secondary | ICD-10-CM | POA: Diagnosis not present

## 2019-11-19 DIAGNOSIS — Z4682 Encounter for fitting and adjustment of non-vascular catheter: Secondary | ICD-10-CM | POA: Diagnosis not present

## 2019-11-19 DIAGNOSIS — K449 Diaphragmatic hernia without obstruction or gangrene: Secondary | ICD-10-CM | POA: Diagnosis not present

## 2019-11-19 DIAGNOSIS — E041 Nontoxic single thyroid nodule: Secondary | ICD-10-CM | POA: Diagnosis not present

## 2019-11-19 DIAGNOSIS — Z9049 Acquired absence of other specified parts of digestive tract: Secondary | ICD-10-CM | POA: Diagnosis not present

## 2019-11-19 DIAGNOSIS — R1110 Vomiting, unspecified: Secondary | ICD-10-CM | POA: Diagnosis not present

## 2019-11-19 DIAGNOSIS — Z79891 Long term (current) use of opiate analgesic: Secondary | ICD-10-CM | POA: Diagnosis not present

## 2019-11-19 DIAGNOSIS — R079 Chest pain, unspecified: Secondary | ICD-10-CM | POA: Diagnosis not present

## 2019-11-19 DIAGNOSIS — R112 Nausea with vomiting, unspecified: Secondary | ICD-10-CM | POA: Diagnosis not present

## 2019-11-19 DIAGNOSIS — K56609 Unspecified intestinal obstruction, unspecified as to partial versus complete obstruction: Secondary | ICD-10-CM | POA: Diagnosis not present

## 2019-11-19 DIAGNOSIS — F329 Major depressive disorder, single episode, unspecified: Secondary | ICD-10-CM | POA: Diagnosis not present

## 2019-11-19 DIAGNOSIS — Z8501 Personal history of malignant neoplasm of esophagus: Secondary | ICD-10-CM | POA: Diagnosis not present

## 2019-11-19 MED ORDER — TECHNETIUM TC 99M SESTAMIBI GENERIC - CARDIOLITE
25.1000 | Freq: Once | INTRAVENOUS | Status: AC
  Administered 2019-11-19: 25.1 via INTRAVENOUS

## 2019-11-20 ENCOUNTER — Emergency Department (HOSPITAL_COMMUNITY): Payer: Medicare HMO

## 2019-11-20 ENCOUNTER — Inpatient Hospital Stay (HOSPITAL_COMMUNITY)
Admission: EM | Admit: 2019-11-20 | Discharge: 2019-11-24 | DRG: 392 | Disposition: A | Discharge status: Home / Self Care | Payer: Medicare HMO | Attending: Family Medicine | Admitting: Family Medicine

## 2019-11-20 ENCOUNTER — Other Ambulatory Visit: Payer: Self-pay

## 2019-11-20 ENCOUNTER — Encounter (HOSPITAL_COMMUNITY): Payer: Self-pay | Admitting: *Deleted

## 2019-11-20 ENCOUNTER — Inpatient Hospital Stay (HOSPITAL_COMMUNITY): Payer: Medicare HMO

## 2019-11-20 DIAGNOSIS — Z9049 Acquired absence of other specified parts of digestive tract: Secondary | ICD-10-CM | POA: Diagnosis not present

## 2019-11-20 DIAGNOSIS — Z7982 Long term (current) use of aspirin: Secondary | ICD-10-CM

## 2019-11-20 DIAGNOSIS — Z79891 Long term (current) use of opiate analgesic: Secondary | ICD-10-CM | POA: Diagnosis not present

## 2019-11-20 DIAGNOSIS — Z8501 Personal history of malignant neoplasm of esophagus: Secondary | ICD-10-CM

## 2019-11-20 DIAGNOSIS — Z20822 Contact with and (suspected) exposure to covid-19: Secondary | ICD-10-CM | POA: Diagnosis present

## 2019-11-20 DIAGNOSIS — E041 Nontoxic single thyroid nodule: Secondary | ICD-10-CM | POA: Diagnosis present

## 2019-11-20 DIAGNOSIS — N400 Enlarged prostate without lower urinary tract symptoms: Secondary | ICD-10-CM | POA: Diagnosis present

## 2019-11-20 DIAGNOSIS — Z9889 Other specified postprocedural states: Secondary | ICD-10-CM

## 2019-11-20 DIAGNOSIS — Z79899 Other long term (current) drug therapy: Secondary | ICD-10-CM

## 2019-11-20 DIAGNOSIS — K219 Gastro-esophageal reflux disease without esophagitis: Secondary | ICD-10-CM | POA: Diagnosis present

## 2019-11-20 DIAGNOSIS — K449 Diaphragmatic hernia without obstruction or gangrene: Principal | ICD-10-CM | POA: Diagnosis present

## 2019-11-20 DIAGNOSIS — K56609 Unspecified intestinal obstruction, unspecified as to partial versus complete obstruction: Secondary | ICD-10-CM | POA: Diagnosis present

## 2019-11-20 DIAGNOSIS — Z0189 Encounter for other specified special examinations: Secondary | ICD-10-CM

## 2019-11-20 DIAGNOSIS — I4891 Unspecified atrial fibrillation: Secondary | ICD-10-CM | POA: Diagnosis present

## 2019-11-20 DIAGNOSIS — F329 Major depressive disorder, single episode, unspecified: Secondary | ICD-10-CM | POA: Diagnosis present

## 2019-11-20 DIAGNOSIS — E210 Primary hyperparathyroidism: Secondary | ICD-10-CM | POA: Diagnosis present

## 2019-11-20 DIAGNOSIS — Z4659 Encounter for fitting and adjustment of other gastrointestinal appliance and device: Secondary | ICD-10-CM

## 2019-11-20 HISTORY — DX: Unspecified intestinal obstruction, unspecified as to partial versus complete obstruction: K56.609

## 2019-11-20 LAB — CBC
HCT: 46.6 % (ref 39.0–52.0)
Hemoglobin: 15.5 g/dL (ref 13.0–17.0)
MCH: 33 pg (ref 26.0–34.0)
MCHC: 33.3 g/dL (ref 30.0–36.0)
MCV: 99.1 fL (ref 80.0–100.0)
Platelets: 211 10*3/uL (ref 150–400)
RBC: 4.7 MIL/uL (ref 4.22–5.81)
RDW: 13.2 % (ref 11.5–15.5)
WBC: 10.4 10*3/uL (ref 4.0–10.5)
nRBC: 0 % (ref 0.0–0.2)

## 2019-11-20 LAB — HEPATIC FUNCTION PANEL
ALT: 25 U/L (ref 0–44)
AST: 29 U/L (ref 15–41)
Albumin: 4.3 g/dL (ref 3.5–5.0)
Alkaline Phosphatase: 145 U/L — ABNORMAL HIGH (ref 38–126)
Bilirubin, Direct: 0.2 mg/dL (ref 0.0–0.2)
Indirect Bilirubin: 0.5 mg/dL (ref 0.3–0.9)
Total Bilirubin: 0.7 mg/dL (ref 0.3–1.2)
Total Protein: 7.6 g/dL (ref 6.5–8.1)

## 2019-11-20 LAB — BASIC METABOLIC PANEL
Anion gap: 9 (ref 5–15)
BUN: 9 mg/dL (ref 8–23)
CO2: 27 mmol/L (ref 22–32)
Calcium: 10.9 mg/dL — ABNORMAL HIGH (ref 8.9–10.3)
Chloride: 102 mmol/L (ref 98–111)
Creatinine, Ser: 0.96 mg/dL (ref 0.61–1.24)
GFR calc Af Amer: >60 mL/min (ref >60)
GFR calc non Af Amer: >60 mL/min (ref >60)
Glucose, Bld: 121 mg/dL — ABNORMAL HIGH (ref 70–99)
Potassium: 4.5 mmol/L (ref 3.5–5.1)
Sodium: 138 mmol/L (ref 135–145)

## 2019-11-20 LAB — SARS CORONAVIRUS 2 BY RT PCR (HOSPITAL ORDER, PERFORMED IN ~~LOC~~ HOSPITAL LAB): SARS Coronavirus 2: NEGATIVE

## 2019-11-20 LAB — TROPONIN I (HIGH SENSITIVITY)
Troponin I (High Sensitivity): 5 ng/L (ref <18)
Troponin I (High Sensitivity): 4 ng/L (ref <18)

## 2019-11-20 LAB — LIPASE, BLOOD: Lipase: 33 U/L (ref 11–51)

## 2019-11-20 MED ORDER — DIATRIZOATE MEGLUMINE & SODIUM 66-10 % PO SOLN
90.0000 mL | Freq: Once | ORAL | Status: AC
  Administered 2019-11-20: 90 mL via NASOGASTRIC
  Filled 2019-11-20 (×2): qty 90

## 2019-11-20 MED ORDER — PANTOPRAZOLE SODIUM 40 MG IV SOLR
40.0000 mg | Freq: Two times a day (BID) | INTRAVENOUS | Status: DC
  Administered 2019-11-20 – 2019-11-23 (×7): 40 mg via INTRAVENOUS
  Filled 2019-11-20 (×7): qty 40

## 2019-11-20 MED ORDER — METOPROLOL TARTRATE 5 MG/5ML IV SOLN
2.5000 mg | Freq: Four times a day (QID) | INTRAVENOUS | Status: DC
  Administered 2019-11-20 – 2019-11-21 (×4): 2.5 mg via INTRAVENOUS
  Administered 2019-11-21 – 2019-11-22 (×2): 5 mg via INTRAVENOUS
  Administered 2019-11-22 (×2): 2.5 mg via INTRAVENOUS
  Administered 2019-11-22: 5 mg via INTRAVENOUS
  Administered 2019-11-23: 2.5 mg via INTRAVENOUS
  Administered 2019-11-23: 2.6 mg via INTRAVENOUS
  Administered 2019-11-23: 2.5 mg via INTRAVENOUS
  Administered 2019-11-23: 5 mg via INTRAVENOUS
  Filled 2019-11-20 (×13): qty 5

## 2019-11-20 MED ORDER — HYDROMORPHONE HCL 1 MG/ML IJ SOLN
1.0000 mg | INTRAMUSCULAR | Status: DC | PRN
  Administered 2019-11-21 (×2): 1 mg via INTRAVENOUS
  Filled 2019-11-20 (×2): qty 1

## 2019-11-20 MED ORDER — IOHEXOL 300 MG/ML  SOLN
100.0000 mL | Freq: Once | INTRAMUSCULAR | Status: AC | PRN
  Administered 2019-11-20: 100 mL via INTRAVENOUS

## 2019-11-20 MED ORDER — METHOCARBAMOL 1000 MG/10ML IJ SOLN
750.0000 mg | Freq: Three times a day (TID) | INTRAVENOUS | Status: DC | PRN
  Filled 2019-11-20: qty 7.5

## 2019-11-20 MED ORDER — DEXTROSE-NACL 5-0.9 % IV SOLN: INTRAVENOUS | Status: DC

## 2019-11-20 MED ORDER — ONDANSETRON 4 MG PO TBDP
4.0000 mg | ORAL_TABLET | Freq: Three times a day (TID) | ORAL | Status: DC | PRN
  Filled 2019-11-20 (×2): qty 1

## 2019-11-20 MED ORDER — ONDANSETRON HCL 4 MG/2ML IJ SOLN
4.0000 mg | Freq: Once | INTRAMUSCULAR | Status: AC
  Administered 2019-11-20: 4 mg via INTRAVENOUS
  Filled 2019-11-20: qty 2

## 2019-11-20 MED ORDER — SODIUM CHLORIDE 0.9% FLUSH
3.0000 mL | Freq: Once | INTRAVENOUS | Status: AC
  Administered 2019-11-20: 3 mL via INTRAVENOUS

## 2019-11-20 MED ORDER — ONDANSETRON 4 MG PO TBDP
4.0000 mg | ORAL_TABLET | Freq: Once | ORAL | Status: AC
  Administered 2019-11-20: 4 mg via ORAL
  Filled 2019-11-20: qty 1

## 2019-11-20 MED ORDER — SODIUM CHLORIDE 0.9 % IV BOLUS
500.0000 mL | Freq: Once | INTRAVENOUS | Status: AC
  Administered 2019-11-20: 500 mL via INTRAVENOUS

## 2019-11-20 MED ORDER — ONDANSETRON HCL 4 MG/2ML IJ SOLN
4.0000 mg | Freq: Four times a day (QID) | INTRAMUSCULAR | Status: DC
  Administered 2019-11-20 – 2019-11-23 (×13): 4 mg via INTRAVENOUS
  Filled 2019-11-20 (×13): qty 2

## 2019-11-20 MED ORDER — PHENOL 1.4 % MT LIQD
1.0000 | OROMUCOSAL | Status: DC | PRN
  Filled 2019-11-20: qty 177

## 2019-11-20 MED ORDER — PROMETHAZINE HCL 25 MG/ML IJ SOLN
12.5000 mg | Freq: Once | INTRAMUSCULAR | Status: AC
  Administered 2019-11-20: 12.5 mg via INTRAVENOUS
  Filled 2019-11-20: qty 1

## 2019-11-20 MED ORDER — ENOXAPARIN SODIUM 40 MG/0.4ML ~~LOC~~ SOLN
40.0000 mg | SUBCUTANEOUS | Status: DC
  Administered 2019-11-20 – 2019-11-23 (×4): 40 mg via SUBCUTANEOUS
  Filled 2019-11-20 (×4): qty 0.4

## 2019-11-20 NOTE — ED Notes (Signed)
Clamped NG tube to allow PT to work with pt.  Emptied 800cc dark green malororous dranage from suction canister

## 2019-11-20 NOTE — Plan of Care (Signed)
  Problem: Education: Goal: Knowledge of General Education information will improve Description Including pain rating scale, medication(s)/side effects and non-pharmacologic comfort measures Outcome: Progressing   

## 2019-11-20 NOTE — ED Notes (Signed)
Call to 6N to give report. RN to cal me back

## 2019-11-20 NOTE — ED Provider Notes (Signed)
This patient is a very pleasant 74 year old male presenting with increasing abdominal pain, 9 episodes of vomiting in the waiting room and a history of a prior esophagectomy secondary to early esophageal cancer.  He has had multiple small bowel obstructions in the past which have resolved with NG tube placement.  On my exam the patient has no tympanic sounds to percussion but is mild to moderately tender in the mid and left side of the abdomen without guarding or peritoneal signs.  He is not tachycardic and is otherwise well-appearing.  CT scan pending to rule out other causes of the patient's symptoms, may need NG tube if bowel obstruction is present.  The patient is agreeable to IV, medications including antiemetics and CT scan.  Medical screening examination/treatment/procedure(s) were conducted as a shared visit with non-physician practitioner(s) and myself.  I personally evaluated the patient during the encounter.  Clinical Impression:   Final diagnoses:  Small bowel obstruction ([place])         Noemi Chapel, MD 11/21/19 [phone]

## 2019-11-20 NOTE — Evaluation (Signed)
Physical Therapy Evaluation Patient Details Name: Asha Sears. Hagey MRN: [id number] DOB: 12-Jan-1946 Today's Date: 11/20/2019   History of Present Illness  74 yo male with onset of abd pain and N&V was admitted for SBO, now with NG tube and weakness.  PMHx:  SBO, diverticulitis, pulmonary infection, depression, a-fib, lumbar DDD with fusion, Barrett's esophagus, esophageal CA, DVT  Clinical Impression  Pt was seen for mobility on hallway, with notable mild unsteadiness but not a great deal of issue.  Pt is expecting to go home with family assist, may not need any equipment or therapy due to his current functional mobility.  Follow acutely to make progress with distances of gait, to increase endurance and control his balance with mobility.  He is expecting to have family to help, and will work toward safe independent walking with safety awareness of obstacles and balance challenges.    Follow Up Recommendations Supervision for mobility/OOB    Equipment Recommendations  None recommended by PT    Recommendations for Other Services       Precautions / Restrictions Precautions Precautions: Fall Precaution Comments: NG tube Restrictions Weight Bearing Restrictions: No      Mobility  Bed Mobility Overal bed mobility: Needs Assistance Bed Mobility: Supine to Sit;Sit to Supine     Supine to sit: Min guard Sit to supine: Min guard   General bed mobility comments: min guard for safety due to lines  Transfers Overall transfer level: Needs assistance Equipment used: 1 person hand held assist Transfers: Sit to/from Stand Sit to Stand: Supervision         General transfer comment: supervision for safety  Ambulation/Gait Ambulation/Gait assistance: Min guard(for safety) Gait Distance (Feet): 140 Feet Assistive device: 1 person hand held assist Gait Pattern/deviations: Decreased stride length;Wide base of support Gait velocity: mildly reduced Gait velocity interpretation: <1.31  ft/sec, indicative of household ambulator General Gait Details: pt makes wider turns on the hallway but is feeling a little weak and has lines to contend with   Stairs            Wheelchair Mobility    Modified Rankin (Stroke Patients Only)       Balance Overall balance assessment: Needs assistance Sitting-balance support: Feet supported Sitting balance-Leahy Scale: Good     Standing balance support: During functional activity;No upper extremity supported Standing balance-Leahy Scale: Fair                               Pertinent Vitals/Pain Pain Assessment: No/denies pain    Home Living Family/patient expects to be discharged to:: Private residence Living Arrangements: Spouse/significant other;Children;Other relatives Available Help at Discharge: Family;Available 24 hours/day Type of Home: House Home Access: Stairs to enter   [employer] of Steps: 2 Home Layout: One level Home Equipment: None Additional Comments: has been independent with gait prior to this trip    Prior Function Level of Independence: Independent               Hand Dominance   Dominant Hand: Right    Extremity/Trunk Assessment   Upper Extremity Assessment Upper Extremity Assessment: Overall WFL for tasks assessed    Lower Extremity Assessment Lower Extremity Assessment: Overall WFL for tasks assessed    Cervical / Trunk Assessment Cervical / Trunk Assessment: Other exceptions(has lumbar fusion)  Communication   Communication: No difficulties  Cognition Arousal/Alertness: Awake/alert Behavior During Therapy: WFL for tasks assessed/performed Overall Cognitive Status: Within Functional Limits  for tasks assessed                                        General Comments General comments (skin integrity, edema, etc.): pt was able to get up to walk with PT assisting with IV pole and lines, may not need any equipment or follow up therapy unless  something changes with his stability    Exercises     Assessment/Plan    PT Assessment Patient needs continued PT services  PT Problem List Decreased range of motion;Decreased activity tolerance;Decreased balance       PT Treatment Interventions DME instruction;Gait training;Stair training;Functional mobility training;Therapeutic activities;Therapeutic exercise;Balance training;Neuromuscular re-education;Patient/family education    PT Goals (Current goals can be found in the Care Plan section)  Acute Rehab PT Goals Patient Stated Goal: to get home and feel better PT Goal Formulation: With patient Time For Goal Achievement: 11/27/19 Potential to Achieve Goals: Good    Frequency Min 3X/week   Barriers to discharge   home with family help    Co-evaluation               AM-PAC PT "6 Clicks" Mobility  Outcome Measure Help needed turning from your back to your side while in a flat bed without using bedrails?: A Little Help needed moving from lying on your back to sitting on the side of a flat bed without using bedrails?: A Little Help needed moving to and from a bed to a chair (including a wheelchair)?: A Little Help needed standing up from a chair using your arms (e.g., wheelchair or bedside chair)?: A Little Help needed to walk in hospital room?: A Little Help needed climbing 3-5 steps with a railing? : A Little 6 Click Score: 18    End of Session Equipment Utilized During Treatment: Gait belt Activity Tolerance: Patient tolerated treatment well;Treatment limited secondary to medical complications (Comment) Patient left: in bed;with call bell/phone within reach Nurse Communication: Mobility status PT Visit Diagnosis: Unsteadiness on feet (R26.81);Difficulty in walking, not elsewhere classified (R26.2)    Time: [id number] PT Time Calculation (min) (ACUTE ONLY): 23 min   Charges:   PT Evaluation $PT Eval Moderate Complexity: 1 Mod PT Treatments $Gait Training:  8-22 mins       Ramond Dial 11/20/2019, 5:56 PM  Mee Hives, PT MS Acute Rehab Dept. Number: [place] and [place]

## 2019-11-20 NOTE — ED Notes (Signed)
Pt vomiting again, taken back to triage and given zofran, cool cloth to neck, placed in recliner for comfort.

## 2019-11-20 NOTE — Progress Notes (Signed)
Spoke with pt today regarding his lack of anti coagulation use with a fib. Pt assured me he does not have bleeding history or intolerance of anticoagulation. States he was on it during initial year of diagnosis of a fib but not after it became controlled. Pt is okay with prophylactic anticoagulation during hospital admission. Will start lovenox.   Clemetine Marker md.

## 2019-11-20 NOTE — Progress Notes (Signed)
Dr. Jeannine Kitten clarified with patient why he is not on anticoagulation despite having atrial fib.  He reports that he has declined it previously as noted in cards office visit on 06/10/2019.. Will give lovenox for VTE ppx.   Wilber Oliphant, M.D.  4:41 PM 11/20/2019

## 2019-11-20 NOTE — ED Provider Notes (Signed)
[facility] EMERGENCY DEPARTMENT Provider Note   CSN: [id number] Arrival date & time: 11/20/19  [id number]     History Chief Complaint  Patient presents with  . Chest Pain    Thomas Dominguez is a 74 y.o. male.  Patient with history of GERD, atrial fibrillation, stage 1 esophageal cancer s/p transhiatal esophagectomy and with a history of multiple abdominal surgeries including paraesophageal hernia repair in 2013, laparoscopic incisional hernia repair in 2013, and multiple recurrent incisional hernia repair in 2018, recurrent small bowel obstruction -- presents to the emergency department today for abdominal pain and vomiting starting at approximately 9 PM last night.  Patient states that he had a sestamibi scan yesterday for hyperparathyroidism.  Starting last night he developed pain in his mid abdomen described as "being hit in the stomach".  This was associated with waves of pain and approximately 9 episodes of vomiting, last episode was 30 minutes ago.  He states that it feels "75%" like previous episodes of small bowel structure which she has had about 3.  Patient reports radiation of discomfort into the chest but no shortness of breath, cough.  He denies any fevers.  He has had 2 episodes where he passed gas since the pain started.  No bowel movements or blood noted in the stool.  He was given Zofran in triage which helped, however he currently feels his nausea returning.  No anticoagulation, aspirin only.        Past Medical History:  Diagnosis Date  . Acid reflux disease 01/17/2019  . Atrial fibrillation ([place]) 01/17/2019  . BPH (benign prostatic hyperplasia) 01/17/2019  . Degenerative disc disease, lumbar 01/17/2019  . Depression, major, single episode, complete remission ([place]) 01/17/2019  . Diverticulosis 01/17/2019  . DVT (deep venous thrombosis) ([place]) 01/17/2019  . Esophageal cancer ([place])   . GERD (gastroesophageal reflux disease)   . H/O small bowel obstruction  01/17/2019  . Hiatal hernia 01/17/2019    Patient Active Problem List   Diagnosis Date Noted  . Acute ataxia 09/30/2019  . 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  . Primary hyperparathyroidism ([place]) 09/30/2019  . Dyspnea on exertion 09/05/2019  . Hyperlipidemia 09/05/2019  . Leg swelling 01/17/2019  . Atrial fibrillation ([place]) 01/17/2019  . Acid reflux disease 01/17/2019  . H/O small bowel obstruction 01/17/2019  . Hiatal hernia 01/17/2019  . Depression, major, single episode, complete remission ([place]) 01/17/2019  . BPH (benign prostatic hyperplasia) 01/17/2019  . Degenerative disc disease, lumbar 01/17/2019  . Diverticulosis 01/17/2019    Past Surgical History:  Procedure Laterality Date  . ABDOMINAL HERNIA REPAIR  2012, 2014, 2017   ventral  . COLONOSCOPY  2019  . ESOPHAGECTOMY  1997   for esophageal cancer  . SPINAL FUSION  2018, 2020   L2 L5 in 2018, L5-S1 2020       Family History  Problem Relation Age of Onset  . Emphysema Father     Social History   Tobacco Use  . Smoking status: Never Smoker  . Smokeless tobacco: Never Used  Substance Use Topics  . Alcohol use: Yes    Alcohol/week: 7.0 standard drinks    Types: 7 Cans of beer per week  . Drug use: Never    Home Medications Prior to Admission medications   Medication Sig Start Date End Date Taking? Authorizing Provider  aspirin EC 81 MG tablet Take 81 mg by mouth daily.   Yes [provider]  diltiazem (CARDIZEM) 120 MG tablet Take 1 tablet (  120 mg total) by mouth daily. 02/07/19  Yes Benay Pike, MD  methocarbamol (ROBAXIN) 750 MG tablet Take 1 tablet (750 mg total) by mouth 2 (two) times daily as needed for muscle spasms. 09/02/19  Yes Benay Pike, MD  omeprazole (PRILOSEC) 40 MG capsule Take 1 capsule (40 mg total) by mouth 2 (two) times daily. 04/21/19 09/10/20 Yes Nandigam, [name], MD  propafenone (RYTHMOL) 225 MG tablet TAKE 1 TABLET (225 MG TOTAL) BY MOUTH EVERY 8 HOURS. 06/10/19   Yes Donato Heinz, MD  sucralfate (CARAFATE) 1 g tablet Take 1 tablet (1 g total) by mouth 4 (four) times daily -  with meals and at bedtime. 04/18/19  Yes Nandigam, [name], MD  tamsulosin (FLOMAX) 0.4 MG CAPS capsule TAKE 2 CAPSULES EVERY DAY Patient taking differently: 0.4 mg in the morning and at bedtime.  08/21/19  Yes Benay Pike, MD  traMADol (ULTRAM) 50 MG tablet Take 50 mg by mouth 2 (two) times daily as needed. 10/24/17  Yes [provider]  venlafaxine XR (EFFEXOR-XR) 150 MG 24 hr capsule TAKE 1 CAPSULE EVERY DAY WITH BREAKFAST Patient taking differently: Take 150 mg by mouth daily with breakfast.  11/18/19  Yes Benay Pike, MD    Allergies    Mobic [meloxicam] and Statins  Review of Systems   Review of Systems  Constitutional: Negative for fever.  HENT: Negative for rhinorrhea and sore throat.   Eyes: Negative for redness.  Respiratory: Positive for chest tightness. Negative for cough.   Cardiovascular: Negative for chest pain.  Gastrointestinal: Positive for abdominal pain, nausea and vomiting. Negative for blood in stool, constipation and diarrhea.  Genitourinary: Negative for dysuria.  Musculoskeletal: Negative for myalgias.  Skin: Negative for rash.  Neurological: Negative for headaches.    Physical Exam Updated Vital Signs BP (!) 140/101   Pulse 74   Temp 97.6 F (36.4 C) (Oral)   Resp 10   Ht 6\' 1"  (1.854 m)   Wt 91.6 kg   SpO2 99%   BMI 26.65 kg/m   Physical Exam Vitals and nursing note reviewed.  Constitutional:      General: He is not in acute distress.    Appearance: He is well-developed.  HENT:     Head: Normocephalic and atraumatic.  Eyes:     General:        Right eye: No discharge.        Left eye: No discharge.     Conjunctiva/sclera: Conjunctivae normal.  Cardiovascular:     Rate and Rhythm: Normal rate and regular rhythm.     Heart sounds: Normal heart sounds.  Pulmonary:     Effort: Pulmonary effort is  normal.     Breath sounds: Normal breath sounds. No decreased breath sounds, wheezing or rhonchi.  Abdominal:     Palpations: Abdomen is soft.     Tenderness: There is abdominal tenderness. There is no guarding or rebound.     Comments: Mild diffuse tenderness, no rebound or guarding, patient is in no acute distress.  Postsurgical changes noted on abdomen with well-healed midline scar.  Tympanic percussion notes across abdomen except for left lower abdomen which is dull.  Musculoskeletal:     Cervical back: Normal range of motion and neck supple.  Skin:    General: Skin is warm and dry.  Neurological:     Mental Status: He is alert.     ED Results / Procedures / Treatments   Labs (all labs  ordered are listed, but only abnormal results are displayed) Labs Reviewed  BASIC METABOLIC PANEL - Abnormal; Notable for the following components:      Result Value   Glucose, Bld 121 (*)    Calcium 10.9 (*)    All other components within normal limits  HEPATIC FUNCTION PANEL - Abnormal; Notable for the following components:   Alkaline Phosphatase 145 (*)    All other components within normal limits  SARS CORONAVIRUS 2 BY RT PCR (HOSPITAL ORDER, [facility])  CBC  LIPASE, BLOOD  TROPONIN I (HIGH SENSITIVITY)  TROPONIN I (HIGH SENSITIVITY)    EKG EKG Interpretation  Date/Time:  Thursday Nov 20 2019 02:20:20 EDT Ventricular Rate:  85 PR Interval:  182 QRS Duration: 74 QT Interval:  340 QTC Calculation: 404 R Axis:   112 Text Interpretation: Normal sinus rhythm Right axis deviation Septal infarct , age undetermined Abnormal ECG No old tracing to compare Confirmed by Noemi Chapel [phone]) on 11/20/2019 7:21:59 AM   Radiology DG Chest 2 View  Result Date: 11/20/2019 CLINICAL DATA:  Chest pain. EXAM: CHEST - 2 VIEW COMPARISON:  Radiograph 09/02/2019. CT 09/22/2019 FINDINGS: Unchanged mediastinal contours. Smoothly marginated density about the right mediastinum  corresponds to esophageal pull-through. This is unchanged from prior exam. Left retrocardiac opacity corresponds to diaphragmatic hernia on CT which contained colon. No acute airspace disease. No pulmonary edema. No pneumothorax or pleural effusion. No acute osseous abnormalities are seen. IMPRESSION: 1. No acute abnormality. 2. Stable appearance of esophageal pull-through. Electronically Signed   By: Keith Rake M.D.   On: 11/20/2019 02:54   CT ABDOMEN PELVIS W CONTRAST  Result Date: 11/20/2019 CLINICAL DATA:  Abdominal pain with nausea and vomiting. History of esophageal cancer. Concern for bowel obstruction EXAM: CT ABDOMEN AND PELVIS WITH CONTRAST TECHNIQUE: Multidetector CT imaging of the abdomen and pelvis was performed using the standard protocol following bolus administration of intravenous contrast. CONTRAST:  162mL OMNIPAQUE IOHEXOL 300 MG/ML  SOLN COMPARISON:  None. FINDINGS: Lower chest:  Airspace disease in the lingula and right middle lobe. Esophagectomy and gastric pull-through which is moderately distended. Hepatobiliary: Simple appearing cystic density in the right liver measuring 2.5 cm.No evidence of biliary obstruction or stone. Pancreas: The body and tail of the pancreas are herniated through the esophageal hiatus. Generalized atrophy. No acute finding. Spleen: Unremarkable. Adrenals/Urinary Tract: Negative adrenals. No hydronephrosis or stone. Unremarkable bladder. Stomach/Bowel: Dilated small bowel with transition in the right paramedian mid abdomen there is some angulation of bowel loops. No evidence of bowel inflammation. Esophagectomy with gastric pull-through. Extensive distal colonic diverticulosis. A small portion of the transverse colon is herniated through the esophageal hiatus. Vascular/Lymphatic: No acute vascular abnormality. No mass or adenopathy. Reproductive:Prostate enlargement projecting into the bladder base. Other: No ascites or pneumoperitoneum. Midline hernia  repair using mesh. Musculoskeletal: L2-S1 PLIF. Arthrodesis status at [id number] is uncertain. There is advanced T12-L1 and L1-2 disc degeneration. IMPRESSION: 1. Mid small bowel obstruction likely due to adhesions. 2. Airspace disease in the bilateral lungs worrisome for aspiration in this clinical setting. 3. Esophagectomy and gastric pull-through. Colon and pancreas also herniates through the postoperative hiatus. Electronically Signed   By: Monte Fantasia M.D.   On: 11/20/2019 10:48   NM Parathyroid W/Spect  Result Date: 11/19/2019 CLINICAL DATA:  Hyperparathyroidism, elevated calcium and parathormone levels EXAM: NM PARATHYROID SCINTIGRAPHY AND SPECT IMAGING TECHNIQUE: Following intravenous administration of radiopharmaceutical, early and 2-hour delayed planar images were obtained in the anterior projection. Delayed  triplanar SPECT images were also obtained at 2 hours. RADIOPHARMACEUTICALS:  25.1 mCi Tc-33m Sestamibi IV COMPARISON:  Thyroid ultrasound 11/19/2019 Correlation: CT chest 09/22/2019 FINDINGS: Planar imaging: Normal initial distribution of sestamibi in the thyroid lobes. Normal washout of tracer from thyroid tissue on delayed image. Questionable subtle focus of abnormal sestamibi retention at the inferior pole of the RIGHT thyroid lobe. SPECT imaging: Abnormal sestamibi retention at the expected position of the RIGHT inferior parathyroid gland suspicious for parathyroid adenoma. No abnormal tracer retention at the remaining parathyroid gland locations. Present on both planar and SPECT imaging is abnormal tracer accumulation in the medial RIGHT lower chest extending to the mid mediastinum; this corresponds to gastric pull up identified on chest CT as result of prior esophagectomy. 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 Gastric pull up. Electronically Signed   By: Lavonia Dana M.D.   On: 11/19/2019 13:46   US THYROID  Result Date: 11/19/2019 CLINICAL  DATA:  Other.  Primary hyperparathyroidism. EXAM: THYROID ULTRASOUND TECHNIQUE: Ultrasound examination of the thyroid gland and adjacent soft tissues was performed. COMPARISON:  None. FINDINGS: Parenchymal Echotexture: Normal Isthmus: Normal in size measuring 0.3 cm in diameter Right lobe: Normal in size measuring 5.1 x 2.0 x 2.1 cm Left lobe: Normal in size measuring 5.0 x 1.2 x 1.9 cm _________________________________________________________ Estimated total number of nodules >/= 1 cm: 0 Number of spongiform nodules >/=  2 cm not described below (TR1): 0 Number of mixed cystic and solid nodules >/= 1.5 cm not described below (TR2): 0 _________________________________________________________ There is an approximately 0.6 cm hypoechoic nodule within the mid aspect the right lobe of the thyroid (labeled 1), which does not meet criteria to recommend percutaneous sampling or continued dedicated follow-up. There is a punctate (approximately 0.7 cm) hypoechoic nodule within the inferior pole the right lobe of the thyroid (labeled 2), which does not meet criteria to recommend percutaneous sampling or continued dedicated follow-up. _________________________________________________________ Adjacent to the inferior pole of the right lobe of the thyroid is an approximately 1.2 x 1.2 x 0.6 cm hypoechoic nodule which appears to contain a central echogenic hilum (images 24 through 30). _________________________________________________________ There is an approximately 0.5 cm anechoic cyst within mid aspect the left lobe of the thyroid (labeled 3), which does not meet criteria to recommend percutaneous sampling or continued dedicated follow-up There is an approximately 0.8 cm partially cystic though predominantly solid hypoechoic nodule within the inferior pole the left lobe of the thyroid (labeled 4), which does not meet criteria to recommend percutaneous sampling or dedicated follow-up There is an approximately 0.5 cm hypoechoic  nodule within mid aspect the left lobe of the thyroid (labeled 5), not meet criteria to recommend percutaneous sampling or continued dedicated follow-up. IMPRESSION: 1. Mildly heterogeneous appearing but normal sized thyroid gland without worrisome thyroid nodule or mass. 2. Adjacent to the inferior pole of the right lobe of the thyroid is an approximately 1.2 cm hypoechoic nodule which may represent a cervical lymph node (given presence of apparent echogenic hilum), though given provided history of hyperthyroidism, conceivably a parathyroid adenoma could have a similar appearance. Further evaluation with nuclear medicine parathyroid scintigraphy and/or contrast-enhanced neck CT performed as indicated. The above is in keeping with the ACR TI-RADS recommendations - J Am Coll Radiol 2017;14:587-595. Electronically Signed   By: Sandi Mariscal M.D.   On: 11/19/2019 16:49    Procedures Procedures (including critical care time)  Medications Ordered in ED Medications  sodium chloride flush (NS) 0.9 %  injection 3 mL (3 mLs Intravenous Given 11/20/19 1008)  ondansetron (ZOFRAN-ODT) disintegrating tablet 4 mg (4 mg Oral Given 11/20/19 0641)  ondansetron (ZOFRAN) injection 4 mg (4 mg Intravenous Given 11/20/19 0754)  sodium chloride 0.9 % bolus 500 mL (500 mLs Intravenous New Bag/Given 11/20/19 0753)  promethazine (PHENERGAN) injection 12.5 mg (12.5 mg Intravenous Given 11/20/19 1007)  iohexol (OMNIPAQUE) 300 MG/ML solution 100 mL (100 mLs Intravenous Contrast Given 11/20/19 0949)    ED Course  I have reviewed the triage vital signs and the nursing notes.  Pertinent labs & imaging results that were available during my care of the patient were reviewed by me and considered in my medical decision making (see chart for details).  Patient seen and examined.  Cardiac order set ordered on triage.  LFTs and CT of the abdomen and pelvis ordered to eval for obstruction.  Low concern for mesenteric ischemia at this time.   Patient will remain n.p.o.  Will give fluid hydration, IV Zofran.  Patient discussed with Dr. Sabra Heck.  Vital signs reviewed and are as follows: BP (!) 140/101   Pulse 74   Temp 97.6 F (36.4 C) (Oral)   Resp 10   Ht 6\' 1"  (1.854 m)   Wt 91.6 kg   SpO2 99%   BMI 26.65 kg/m   CT reviewed. Appears c/w SBO. Will plan for NG tube, surgical consultation.   Patient and wife, now at bedside, updated.   I spoke with Will Creig Hines PA-C with general surgery, plan for consult. Request medical admission for patient. Family Practice was paged.   Spoke with Family Practice who will see for admit.      MDM Rules/Calculators/A&P                      Admit for management of SBO.     Final Clinical Impression(s) / ED Diagnoses Final diagnoses:  Small bowel obstruction [facility])    Rx / DC Orders ED Discharge Orders    None       Carlisle Cater, PA-C 11/20/19 1405    Noemi Chapel, MD 11/21/19 [phone]

## 2019-11-20 NOTE — ED Notes (Signed)
Pt vomiting in lobby, reports hx of bowel blockages, states that his main complaint has really been abd pain and vomiting more than the chest pain he mentioned at triage.

## 2019-11-20 NOTE — H&P (Signed)
[facility] Admission History and Physical Service Pager: [phone]  Patient name: Thomas Dominguez Medical record number: [id number] Date of birth: [birth year] Age: 74 y.o. Gender: male  Primary Care Provider: Benay Pike, MD Consultants: GEN surg Code Status: Full  Chief Complaint: Vomiting and abdominal pain  Assessment and Plan: Thomas Dominguez is a 74 y.o. male presenting with 1 day history of nausea, vomiting and diffuse generalized abdominal pain consistent with small bowel obstruction. PMH is significant for SBO, A. fib, BPH, acid reflux, depression, hiatal hernia, primary hyperparathyroidism  Small bowel obstruction Patient has a history of small bowel obstructions requiring hospitalization.  He believes this is his fourth 1, most recent was 1.5 years ago.  Symptoms of his episode include abdominal pain and nausea and vomiting last night starting 9 PM.  Patient afebrile with no leukocytosis.  CT abdomen consistent with SBO.  General surgery consulted in ED.  Will treat nonoperatively as able. -Admit to inpatient, medical telemetry, Dr. Olive Bass attending -General surgery consulted, appreciate recs -N.p.o. -NG tube to suction -I's and O's -A.m. CBC and CMP -Zofran ODT every 8 hours as needed 4 mg -Dilaudid 1 mg every 3 hours as needed -Convert p.o. home meds to IV as able. -Up with assistance  Atrial fibrillation -Well controlled on home propafenone and diltiazem.  Working with pharmacy to convert p.o. to IV -IV metoprolol 2.5 to 5 mg every 6 hours -Continuous cardiac monitoring -Restart p.o. meds as able  Primary hyperparathyroidism. Patient currently being worked up for primary hyperparathyroidism as an outpatient.  Recently had a sestamibi scan.  Is awaiting results of latest imaging from the general surgeon. -Continue to monitor calcium with BMP  Depression Well controlled on home Effexor XL.  Unable to give while n.p.o. -Resume when  tolerating p.o.  BPH -Hold home Flomax while n.p.o.  Low back pain secondary to degenerative disc disease Patient takes Robaxin 700 mg 3 times daily at home and tramadol -Continue IV Robaxin-750 milligrams 3 times daily -Hold tramadol while n.p.o.  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  Patient currently has chronic hiatal hernia that involves the colon and pancreas.  On 40 twice daily of omeprazole at home. -IV pantoprazole 40 twice daily while n.p.o. -Convert as able to p.o.  History of pulmonary infection Patient was experiencing worsening dyspnea last December and was referred to pulmonologist given his abnormal lung CT scan findings.  Patient diagnosed with unknown pulmonary infection and given 4 weeks of Augmentin.  Patient states his dyspnea is completely resolved after this treatment.  CT scan on this admission showed airspace disease in the bilateral lungs worrisome from aspiration, but this most likely reflects his chronic pulmonary infection.  FEN/GI: N.p.o., pantoprazole Prophylaxis: SCDs  Disposition: Med telemetry  History of Present Illness:  Thomas Dominguez is a 74 y.o. male presenting with 1 day history of diffuse abdominal pain followed by nausea and vomiting.  Patient states that last night around 9 PM he developed generalized abdominal pain that he felt was consistent with previous small bowel obstructions.  He went to the emergency department a few hours later.  Sometime around 3 or 4 in the morning he developed vomiting.  He vomited 3-4 times, nonbloody, nonbilious.  His last bowel movement was yesterday morning.  Denies fever or chills.  Developed a headache and chest discomfort after his abdominal symptoms occurred, but these  have since resolved.  Review Of Systems: Per HPI with the following additions:   ROS  Patient Active Problem List   Diagnosis  Date Noted  . Small bowel obstruction ([place]) 11/20/2019  . Acute ataxia 09/30/2019  . 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  . Primary hyperparathyroidism ([place]) 09/30/2019  . Dyspnea on exertion 09/05/2019  . Hyperlipidemia 09/05/2019  . Leg swelling 01/17/2019  . Atrial fibrillation ([place]) 01/17/2019  . Acid reflux disease 01/17/2019  . H/O small bowel obstruction 01/17/2019  . Hiatal hernia 01/17/2019  . Depression, major, single episode, complete remission ([place]) 01/17/2019  . BPH (benign prostatic hyperplasia) 01/17/2019  . Degenerative disc disease, lumbar 01/17/2019  . Diverticulosis 01/17/2019    Past Medical History: Past Medical History:  Diagnosis Date  . Acid reflux disease 01/17/2019  . Atrial fibrillation ([place]) 01/17/2019  . BPH (benign prostatic hyperplasia) 01/17/2019  . Degenerative disc disease, lumbar 01/17/2019  . Depression, major, single episode, complete remission ([place]) 01/17/2019  . Diverticulosis 01/17/2019  . DVT (deep venous thrombosis) ([place]) 01/17/2019  . Esophageal cancer ([facility])   . GERD (gastroesophageal reflux disease)   . H/O small bowel obstruction 01/17/2019  . Hiatal hernia 01/17/2019    Past Surgical History: Past Surgical History:  Procedure Laterality Date  . ABDOMINAL HERNIA REPAIR  2012, 2014, 2017   ventral  . COLONOSCOPY  2019  . ESOPHAGECTOMY  1997   for esophageal cancer  . SPINAL FUSION  2018, 2020   L2 L5 in 2018, L5-S1 2020    Social History: Social History   Tobacco Use  . Smoking status: Never Smoker  . Smokeless tobacco: Never Used  Substance Use Topics  . Alcohol use: Yes    Alcohol/week: 7.0 standard drinks    Types: 7 Cans of beer per week  . Drug use: Never   Additional social history: None Please also refer to relevant sections of EMR.  Family History: Family History  Problem Relation Age of Onset  . Emphysema Father     Allergies and Medications: Allergies  Allergen Reactions  . Mobic [Meloxicam] Other  (See Comments)    SOB and muscle pain  . Statins Other (See Comments)    Muscle pain   No current facility-administered medications on file prior to encounter.   Current Outpatient Medications on File Prior to Encounter  Medication Sig Dispense Refill  . aspirin EC 81 MG tablet Take 81 mg by mouth daily.    Marland Kitchen diltiazem (CARDIZEM) 120 MG tablet Take 1 tablet (120 mg total) by mouth daily. 90 tablet 3  . methocarbamol (ROBAXIN) 750 MG tablet Take 1 tablet (750 mg total) by mouth 2 (two) times daily as needed for muscle spasms. 60 tablet 4  . omeprazole (PRILOSEC) 40 MG capsule Take 1 capsule (40 mg total) by mouth 2 (two) times daily. 180 capsule 3  . propafenone (RYTHMOL) 225 MG tablet TAKE 1 TABLET (225 MG TOTAL) BY MOUTH EVERY 8 HOURS. 180 tablet 3  . sucralfate (CARAFATE) 1 g tablet Take 1 tablet (1 g total) by mouth 4 (four) times daily -  with meals and at bedtime. 360 tablet 3  . tamsulosin (FLOMAX) 0.4 MG CAPS capsule TAKE 2 CAPSULES EVERY DAY (Patient taking differently: 0.4 mg in the morning and at bedtime. ) 180 capsule 3  . traMADol (ULTRAM) 50 MG tablet Take 50 mg by mouth 2 (two) times daily as needed.    . venlafaxine XR (EFFEXOR-XR) 150 MG 24 hr capsule TAKE 1 CAPSULE EVERY  DAY WITH BREAKFAST (Patient taking differently: Take 150 mg by mouth daily with breakfast. ) 90 capsule 3    Objective: BP (!) 140/101   Pulse 74   Temp 97.6 F (36.4 C) (Oral)   Resp 10   Ht 6\' 1"  (1.854 m)   Wt 91.6 kg   SpO2 99%   BMI 26.65 kg/m  Exam: General: Alert, oriented.  Laying in bed.  Mild to moderate discomfort.  NG tube in place Eyes: PERRLA.  EOMI.  No scleral icterus ENTM: Moist oral mucosa. Neck: No thyromegaly or nodules Cardiovascular: Regular rate, rhythm.  No murmurs.  1+ pitting edema in the lower extremities bilaterally. Respiratory: Lungs clear to auscultation bilaterally. Gastrointestinal: Mildly distended.  Nontender to light palpation.  Minimal bowel sounds. MSK: 5/5  strength upper and lower extremities bilaterally. Derm: No rashes. Neuro: Cranial nerves II through XII grossly intact. Psych: Pleasant affect.  Normal speech.  Makes eye contact.  Labs and Imaging: CBC BMET  Recent Labs  Lab 11/20/19 0232  WBC 10.4  HGB 15.5  HCT 46.6  PLT 211   Recent Labs  Lab 11/20/19 0232  NA 138  K 4.5  CL 102  CO2 27  BUN 9  CREATININE 0.96  GLUCOSE 121*  CALCIUM 10.9*     CT abdomen/pelvis:1. Mid small bowel obstruction likely due to adhesions. 2. Airspace disease in the bilateral lungs worrisome for aspiration in this clinical setting. 3. Esophagectomy and gastric pull-through. Colon and pancreas also herniates through the postoperative hiatus.  Benay Pike, MD 11/20/2019, 2:21 PM PGY-2, [facility] pager: [phone], text pages welcome

## 2019-11-20 NOTE — ED Triage Notes (Signed)
Pt says that tonight, he had onset of abdominal pain, "felt like someone punched me in the stomach", describes a cramping pain, nausea, emesis x 1, and chest pain. LBM yesterday morning.

## 2019-11-20 NOTE — Consult Note (Signed)
[facility] Consult Note  Thomas Dominguez. Monfils Aug 02, 1945  [id number].    Requesting MD: Noemi Chapel Chief Complaint:  abdominal pain Reason for Consult: SBO  HPI: Patient is a 74 year old male with a history of esophageal cancer and probable esophagogastrectomy in 1997, at [facility].  Postop he had several surgeries for recurrent hernias.  These were also in the 90s.  He says on his last surgery they placed some mesh and did that laparoscopically.  Over the last 5 years he has had 3 episodes of small bowel obstruction.  Each episode has been treated with conservative management; including NG placement, bowel rest, and IV hydration.  I did not see any hospitalizations here for this.  He reports he was doing well until around 9 PM yesterday when he developed sudden acute abdominal pain.  This was followed by nausea and vomiting.  He reports his last bowel movement was yesterday a.m.  He presented to the ED and was treated with Zofran.  His pain is better.  Currently nausea and vomiting better.  He says the nausea is beginning to return.  Work-up in the ED shows he is afebrile, blood pressure was elevated as high as 140/101.  His last blood pressure at 1300 was 123/67.  Labs shows a normal BMP and LFTs.  Calcium slightly elevated at 10.9, glucose 121, alk phos 145  WBC 10.4, hemoglobin 15.5, hematocrit 46.6, and platelets 211,000.  Admission 2 view chest x-ray was stable with no acute abnormalities appearance of an esophageal pull-through procedure.  CT scan shows some lower chest airspace disease in the lingula and the right middle lobe.  Esophagectomy with an pull-through which is moderately distended, the body and tail of the pancreas are herniated through the esophageal hiatus, no acute findings.  There is dilated small bowel with transition in the right paramedian mid abdomen and there is some angulation the bowel loops.  No inflammatory bowel disease, extensive distal  colonic diverticulosis small portion of the transverse colon is herniated through the esophageal hiatus. We are asked to see for evaluation of his small bowel obstruction.  He underwent an ultrasound of the thyroid, and parathyroid scan on 08/22/2019.  Ultrasound showed a normal-sized thyroid without worrisome nodules or masses.  The inferior pole of the right lobe of the thyroid had a 1.2 cm hyperechoic nodule which could be a lymph node, or a parathyroid adenoma.  We are asked to see.   ROS: Review of Systems  Constitutional: Negative.   HENT: Positive for hearing loss (he wears hearing aides - does not have them with him).   Respiratory: Positive for cough, shortness of breath and wheezing.        He has had some problems with GERD and probable aspiration pneumonia.  He is being followed by pulmonary and is generally scheduled for repeat CT scan in the near future.  He completed an antibiotic course 2 to 4 weeks ago.  Cardiovascular: Negative.        Atrial fibrillation well-controlled on medications.  He is only on a baby aspirin for anticoagulation.  Gastrointestinal: Positive for abdominal pain, heartburn, nausea and vomiting. Negative for blood in stool, constipation and diarrhea.  Genitourinary: Negative.        Urine outputs been down some today but otherwise normal prior to yesterday.  Musculoskeletal: Negative.   Skin: Negative.   Neurological: Negative.   Endo/Heme/Allergies: Negative.   Psychiatric/Behavioral: Negative.     Family History  Problem Relation Age  of Onset  . Emphysema Father     Past Medical History:  Diagnosis Date  . Acid reflux disease 01/17/2019  . Atrial fibrillation ([place]) 01/17/2019  . BPH (benign prostatic hyperplasia) 01/17/2019  . Degenerative disc disease, lumbar 01/17/2019  . Depression, major, single episode, complete remission ([place]) 01/17/2019  . Diverticulosis 01/17/2019  . DVT (deep venous thrombosis) ([place]) 01/17/2019  . Esophageal cancer  ([place])   . GERD (gastroesophageal reflux disease)   . H/O small bowel obstruction 01/17/2019  . Hiatal hernia 01/17/2019    Past Surgical History:  Procedure Laterality Date  . ABDOMINAL HERNIA REPAIR  2012, 2014, 2017   ventral  . COLONOSCOPY  2019  . ESOPHAGECTOMY  1997   for esophageal cancer  . SPINAL FUSION  2018, 2020   L2 L5 in 2018, L5-S1 2020    Social History:  reports that he has never smoked. He has never used smokeless tobacco. He reports current alcohol use of about 7.0 standard drinks of alcohol per week. He reports that he does not use drugs.  Allergies:  Allergies  Allergen Reactions  . Mobic [Meloxicam] Other (See Comments)    SOB and muscle pain  . Statins Other (See Comments)    Muscle pain    (Not in a hospital admission)   Blood pressure (!) 140/101, pulse 74, temperature 97.6 F (36.4 C), temperature source Oral, resp. rate 10, height 6' 1"  (1.854 m), weight 91.6 kg, SpO2 99 %. Physical Exam:  General: pleasant, WD, WN white male who is laying in bed in NAD, starting to have some recurrent nausea. HEENT: head is normocephalic, atraumatic.  Sclera are noninjected.  Pupils are equal.  Ears and nose without any masses or lesions.  Mouth is pink and moist Heart: regular, rate, and rhythm.  Normal s1,s2. No obvious murmurs, gallops, or rubs noted.  Palpable radial and pedal pulses bilaterally.  Sinus rhythm on telemetry Lungs: CTAB, no wheezes, rhonchi, or rales noted.  Respiratory effort nonlabored Abd: soft, NT, mild distention, no bowel sounds, no masses, hernias, or organomegaly.  Well-healed large midline surgical incision from just below the xiphoid to below the umbilicus.  No flatus, no bowel sounds.  Last BM was 11/19/2019 in the AM. MS: all 4 extremities are symmetrical with no cyanosis, clubbing, or edema. Skin: warm and dry with no masses, lesions, or rashes Neuro: Cranial nerves 2-12 grossly intact, sensation is normal throughout Psych: A&Ox3 with  an appropriate affect.   Results for orders placed or performed during the hospital encounter of 11/20/19 (from the past 48 hour(s))  Basic metabolic panel     Status: Abnormal   Collection Time: 11/20/19  2:32 AM  Result Value Ref Range   Sodium 138 135 - 145 mmol/L   Potassium 4.5 3.5 - 5.1 mmol/L   Chloride 102 98 - 111 mmol/L   CO2 27 22 - 32 mmol/L   Glucose, Bld 121 (H) 70 - 99 mg/dL    Comment: Glucose reference range applies only to samples taken after fasting for at least 8 hours.   BUN 9 8 - 23 mg/dL   Creatinine, Ser 0.96 0.61 - 1.24 mg/dL   Calcium 10.9 (H) 8.9 - 10.3 mg/dL   GFR calc non Af Amer >60 >60 mL/min   GFR calc Af Amer >60 >60 mL/min   Anion gap 9 5 - 15    Comment: Performed at [facility] [address], [place], [place] [address]  CBC  Status: None   Collection Time: 11/20/19  2:32 AM  Result Value Ref Range   WBC 10.4 4.0 - 10.5 K/uL   RBC 4.70 4.22 - 5.81 MIL/uL   Hemoglobin 15.5 13.0 - 17.0 g/dL   HCT 46.6 39.0 - 52.0 %   MCV 99.1 80.0 - 100.0 fL   MCH 33.0 26.0 - 34.0 pg   MCHC 33.3 30.0 - 36.0 g/dL   RDW 13.2 11.5 - 15.5 %   Platelets 211 150 - 400 K/uL   nRBC 0.0 0.0 - 0.2 %    Comment: Performed at [facility], [place] [address], [place], [place] [address]  Troponin I (High Sensitivity)     Status: None   Collection Time: 11/20/19  2:32 AM  Result Value Ref Range   Troponin I (High Sensitivity) 5 <18 ng/L    Comment: (NOTE) Elevated high sensitivity troponin I (hsTnI) values and significant  changes across serial measurements may suggest ACS but many other  chronic and acute conditions are known to elevate hsTnI results.  Refer to the "Links" section for chest pain algorithms and additional  guidance. Performed at [facility], [place] [address] [place], [place] [address]   Lipase, blood     Status: None   Collection Time: 11/20/19  2:32 AM  Result Value Ref Range   Lipase 33 11 - 51 U/L    Comment: Performed at  [facility], [place] [address] [place], [place] [address]  Troponin I (High Sensitivity)     Status: None   Collection Time: 11/20/19  4:50 AM  Result Value Ref Range   Troponin I (High Sensitivity) 4 <18 ng/L    Comment: (NOTE) Elevated high sensitivity troponin I (hsTnI) values and significant  changes across serial measurements may suggest ACS but many other  chronic and acute conditions are known to elevate hsTnI results.  Refer to the "Links" section for chest pain algorithms and additional  guidance. Performed at [facility], [place] [address], [place], [place] [address]   Hepatic function panel     Status: Abnormal   Collection Time: 11/20/19  7:52 AM  Result Value Ref Range   Total Protein 7.6 6.5 - 8.1 g/dL   Albumin 4.3 3.5 - 5.0 g/dL   AST 29 15 - 41 U/L   ALT 25 0 - 44 U/L   Alkaline Phosphatase 145 (H) 38 - 126 U/L   Total Bilirubin 0.7 0.3 - 1.2 mg/dL   Bilirubin, Direct 0.2 0.0 - 0.2 mg/dL   Indirect Bilirubin 0.5 0.3 - 0.9 mg/dL    Comment: Performed at [facility] [address] [place], [place] [address]   DG Chest 2 View  Result Date: 11/20/2019 CLINICAL DATA:  Chest pain. EXAM: CHEST - 2 VIEW COMPARISON:  Radiograph 09/02/2019. CT 09/22/2019 FINDINGS: Unchanged mediastinal contours. Smoothly marginated density about the right mediastinum corresponds to esophageal pull-through. This is unchanged from prior exam. Left retrocardiac opacity corresponds to diaphragmatic hernia on CT which contained colon. No acute airspace disease. No pulmonary edema. No pneumothorax or pleural effusion. No acute osseous abnormalities are seen. IMPRESSION: 1. No acute abnormality. 2. Stable appearance of esophageal pull-through. Electronically Signed   By: Keith Rake M.D.   On: 11/20/2019 02:54   CT ABDOMEN PELVIS W CONTRAST  Result Date: 11/20/2019 CLINICAL DATA:  Abdominal pain with nausea and vomiting. History of esophageal cancer. Concern for bowel  obstruction EXAM: CT ABDOMEN AND PELVIS  WITH CONTRAST TECHNIQUE: Multidetector CT imaging of the abdomen and pelvis was performed using the standard protocol following bolus administration of intravenous contrast. CONTRAST:  128m OMNIPAQUE IOHEXOL 300 MG/ML  SOLN COMPARISON:  None. FINDINGS: Lower chest:  Airspace disease in the lingula and right middle lobe. Esophagectomy and gastric pull-through which is moderately distended. Hepatobiliary: Simple appearing cystic density in the right liver measuring 2.5 cm.No evidence of biliary obstruction or stone. Pancreas: The body and tail of the pancreas are herniated through the esophageal hiatus. Generalized atrophy. No acute finding. Spleen: Unremarkable. Adrenals/Urinary Tract: Negative adrenals. No hydronephrosis or stone. Unremarkable bladder. Stomach/Bowel: Dilated small bowel with transition in the right paramedian mid abdomen there is some angulation of bowel loops. No evidence of bowel inflammation. Esophagectomy with gastric pull-through. Extensive distal colonic diverticulosis. A small portion of the transverse colon is herniated through the esophageal hiatus. Vascular/Lymphatic: No acute vascular abnormality. No mass or adenopathy. Reproductive:Prostate enlargement projecting into the bladder base. Other: No ascites or pneumoperitoneum. Midline hernia repair using mesh. Musculoskeletal: L2-S1 PLIF. Arthrodesis status at LT4-S5is uncertain. There is advanced T12-L1 and L1-2 disc degeneration. IMPRESSION: 1. Mid small bowel obstruction likely due to adhesions. 2. Airspace disease in the bilateral lungs worrisome for aspiration in this clinical setting. 3. Esophagectomy and gastric pull-through. Colon and pancreas also herniates through the postoperative hiatus. Electronically Signed   By: JMonte FantasiaM.D.   On: 11/20/2019 10:48   NM Parathyroid W/Spect  Result Date: 11/19/2019 CLINICAL DATA:  Hyperparathyroidism, elevated calcium and parathormone levels  EXAM: NM PARATHYROID SCINTIGRAPHY AND SPECT IMAGING TECHNIQUE: Following intravenous administration of radiopharmaceutical, early and 2-hour delayed planar images were obtained in the anterior projection. Delayed triplanar SPECT images were also obtained at 2 hours. RADIOPHARMACEUTICALS:  25.1 mCi Tc-926mestamibi IV COMPARISON:  Thyroid ultrasound 11/19/2019 Correlation: CT chest 09/22/2019 FINDINGS: Planar imaging: Normal initial distribution of sestamibi in the thyroid lobes. Normal washout of tracer from thyroid tissue on delayed image. Questionable subtle focus of abnormal sestamibi retention at the inferior pole of the RIGHT thyroid lobe. SPECT imaging: Abnormal sestamibi retention at the expected position of the RIGHT inferior parathyroid gland suspicious for parathyroid adenoma. No abnormal tracer retention at the remaining parathyroid gland locations. Present on both planar and SPECT imaging is abnormal tracer accumulation in the medial RIGHT lower chest extending to the mid mediastinum; this corresponds to gastric pull up identified on chest CT as result of prior esophagectomy. 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 Gastric pull up. Electronically Signed   By: MaLavonia Dana.D.   On: 11/19/2019 13:46   [place]  Result Date: 11/19/2019 CLINICAL DATA:  Other.  Primary hyperparathyroidism. EXAM: THYROID ULTRASOUND TECHNIQUE: Ultrasound examination of the thyroid gland and adjacent soft tissues was performed. COMPARISON:  None. FINDINGS: Parenchymal Echotexture: Normal Isthmus: Normal in size measuring 0.3 cm in diameter Right lobe: Normal in size measuring 5.1 x 2.0 x 2.1 cm Left lobe: Normal in size measuring 5.0 x 1.2 x 1.9 cm _________________________________________________________ Estimated total number of nodules >/= 1 cm: 0 Number of spongiform nodules >/=  2 cm not described below (TR1): 0 Number of mixed cystic and solid nodules >/= 1.5 cm not  described below (TR2): 0 _________________________________________________________ There is an approximately 0.6 cm hypoechoic nodule within the mid aspect the right lobe of the thyroid (labeled 1), which does not meet criteria to recommend percutaneous sampling or continued dedicated follow-up. There is a punctate (approximately 0.7 cm) hypoechoic nodule within  the inferior pole the right lobe of the thyroid (labeled 2), which does not meet criteria to recommend percutaneous sampling or continued dedicated follow-up. _________________________________________________________ Adjacent to the inferior pole of the right lobe of the thyroid is an approximately 1.2 x 1.2 x 0.6 cm hypoechoic nodule which appears to contain a central echogenic hilum (images 24 through 30). _________________________________________________________ There is an approximately 0.5 cm anechoic cyst within mid aspect the left lobe of the thyroid (labeled 3), which does not meet criteria to recommend percutaneous sampling or continued dedicated follow-up There is an approximately 0.8 cm partially cystic though predominantly solid hypoechoic nodule within the inferior pole the left lobe of the thyroid (labeled 4), which does not meet criteria to recommend percutaneous sampling or dedicated follow-up There is an approximately 0.5 cm hypoechoic nodule within mid aspect the left lobe of the thyroid (labeled 5), not meet criteria to recommend percutaneous sampling or continued dedicated follow-up. IMPRESSION: 1. Mildly heterogeneous appearing but normal sized thyroid gland without worrisome thyroid nodule or mass. 2. Adjacent to the inferior pole of the right lobe of the thyroid is an approximately 1.2 cm hypoechoic nodule which may represent a cervical lymph node (given presence of apparent echogenic hilum), though given provided history of hyperthyroidism, conceivably a parathyroid adenoma could have a similar appearance. Further evaluation with  nuclear medicine parathyroid scintigraphy and/or contrast-enhanced neck CT performed as indicated. The above is in keeping with the ACR TI-RADS recommendations - J Am Coll Radiol 2017;14:587-595. Electronically Signed   By: Sandi Mariscal M.D.   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 GERD with probable pulmonary aspiration -being followed by pulmonary  Recurrent small bowel obstruction Hx esophageal cancer with esophagogastrectomy 1997, [facility]. Recurrent ventral hernias with multiple repairs; including mesh 1990s  FEN: N.p.o./NG tube placement requested ID: None DVT: -He can have chemical anticoagulation from our standpoint Follow-up: TBD  Plan: We recommended NG placement, low intermittent wall suction, bowel rest, IV fluid hydration.  He reports no issues with NG placement in the past.  After he has been on NG suction for 2 to 3 hours we recommend starting the small bowel protocol.  We will follow with you.  Earnstine Regal [facility] 11/20/2019, 11:20 AM Please see Amion for pager number during day hours 7:00am-4:30pm

## 2019-11-20 NOTE — ED Notes (Signed)
Call back to 6N to give report

## 2019-11-20 NOTE — ED Notes (Signed)
See down time charting  From 1250 to present.

## 2019-11-21 ENCOUNTER — Inpatient Hospital Stay (HOSPITAL_COMMUNITY): Payer: Medicare HMO

## 2019-11-21 LAB — COMPREHENSIVE METABOLIC PANEL
ALT: 19 U/L (ref 0–44)
AST: 17 U/L (ref 15–41)
Albumin: 3 g/dL — ABNORMAL LOW (ref 3.5–5.0)
Alkaline Phosphatase: 98 U/L (ref 38–126)
Anion gap: 6 (ref 5–15)
BUN: 8 mg/dL (ref 8–23)
CO2: 27 mmol/L (ref 22–32)
Calcium: 9.8 mg/dL (ref 8.9–10.3)
Chloride: 110 mmol/L (ref 98–111)
Creatinine, Ser: 0.8 mg/dL (ref 0.61–1.24)
GFR calc Af Amer: >60 mL/min (ref >60)
GFR calc non Af Amer: >60 mL/min (ref >60)
Glucose, Bld: 157 mg/dL — ABNORMAL HIGH (ref 70–99)
Potassium: 3.8 mmol/L (ref 3.5–5.1)
Sodium: 143 mmol/L (ref 135–145)
Total Bilirubin: 0.5 mg/dL (ref 0.3–1.2)
Total Protein: 5.6 g/dL — ABNORMAL LOW (ref 6.5–8.1)

## 2019-11-21 LAB — CBC
HCT: 42.1 % (ref 39.0–52.0)
Hemoglobin: 14 g/dL (ref 13.0–17.0)
MCH: 32.9 pg (ref 26.0–34.0)
MCHC: 33.3 g/dL (ref 30.0–36.0)
MCV: 98.8 fL (ref 80.0–100.0)
Platelets: 186 10*3/uL (ref 150–400)
RBC: 4.26 MIL/uL (ref 4.22–5.81)
RDW: 13.3 % (ref 11.5–15.5)
WBC: 6.2 10*3/uL (ref 4.0–10.5)
nRBC: 0 % (ref 0.0–0.2)

## 2019-11-21 MED ORDER — HYDROMORPHONE HCL 1 MG/ML IJ SOLN: 1.0000 mg | Freq: Four times a day (QID) | INTRAMUSCULAR | Status: DC | PRN

## 2019-11-21 MED ORDER — HYDROMORPHONE HCL 1 MG/ML IJ SOLN
1.0000 mg | INTRAMUSCULAR | Status: DC | PRN
  Administered 2019-11-21 (×3): 1 mg via INTRAVENOUS
  Filled 2019-11-21 (×3): qty 1

## 2019-11-21 MED ORDER — ORAL CARE MOUTH RINSE
15.0000 mL | Freq: Two times a day (BID) | OROMUCOSAL | Status: DC
  Administered 2019-11-21 – 2019-11-23 (×6): 15 mL via OROMUCOSAL

## 2019-11-21 NOTE — Evaluation (Signed)
Occupational Therapy Evaluation Patient Details Name: Thomas Dominguez. Hearing MRN: [id number] DOB: May 19, 1946 Today's Date: 11/21/2019    History of Present Illness 74 yo male with onset of abd pain and N&V was admitted for SBO, now with NG tube and weakness.  PMHx:  SBO, diverticulitis, pulmonary infection, depression, a-fib, lumbar DDD with fusion, Barrett's esophagus, esophageal CA, DVT   Clinical Impression   Pt admitted with the above diagnoses and presents with below problem list. Pt will benefit from continued acute OT to address the below listed deficits and maximize independence with basic ADLs prior to d/c home. At baseline, pt is independent with ADLs and active, enjoys walking around the neighborhood. Pt currently setup to supervision with basic ADLs. Eager to get up and walk around unit. Provided level 3 theraband for general UB/LB exercises to help prevent deconditioning. Plan to follow acutely.      Follow Up Recommendations  No OT follow up    Equipment Recommendations  None recommended by OT    Recommendations for Other Services       Precautions / Restrictions Precautions Precautions: Fall Precaution Comments: NG tube Restrictions Weight Bearing Restrictions: No      Mobility Bed Mobility Overal bed mobility: Needs Assistance Bed Mobility: Supine to Sit;Sit to Supine     Supine to sit: Supervision Sit to supine: Supervision   General bed mobility comments: supervision for safety  Transfers Overall transfer level: Needs assistance Equipment used: None Transfers: Sit to/from Stand Sit to Stand: Supervision         General transfer comment: supervision for safety    Balance Overall balance assessment: Needs assistance Sitting-balance support: Feet supported Sitting balance-Leahy Scale: Good     Standing balance support: During functional activity;No upper extremity supported Standing balance-Leahy Scale: Fair                              ADL either performed or assessed with clinical judgement   ADL Overall ADL's : Needs assistance/impaired Eating/Feeding: Set up;Sitting   Grooming: Modified independent;Standing   Upper Body Bathing: Set up;Sitting   Lower Body Bathing: Sit to/from stand;Supervison/ safety   Upper Body Dressing : Set up;Sitting   Lower Body Dressing: Sit to/from stand;Supervision/safety   Toilet Transfer: Supervision/safety   Toileting- Clothing Manipulation and Hygiene: Supervision/safety;Sit to/from stand   Tub/ Shower Transfer: Supervision/safety;Ambulation   Functional mobility during ADLs: Supervision/safety General ADL Comments: Pt completed community distance functional mobility, tolerated fairly well.      Vision         Perception     Praxis      Pertinent Vitals/Pain Pain Assessment: 0-10 Pain Score: 2      Hand Dominance Right   Extremity/Trunk Assessment Upper Extremity Assessment Upper Extremity Assessment: Overall WFL for tasks assessed   Lower Extremity Assessment Lower Extremity Assessment: Defer to PT evaluation;Overall [facility] for tasks assessed   Cervical / Trunk Assessment Cervical / Trunk Assessment: Other exceptions Cervical / Trunk Exceptions: h/o lumbar fusion   Communication Communication Communication: No difficulties   Cognition Arousal/Alertness: Awake/alert Behavior During Therapy: WFL for tasks assessed/performed Overall Cognitive Status: Within Functional Limits for tasks assessed                                     General Comments       Exercises Exercises: Other exercises Other Exercises  Other Exercises: issued level 3 theraband and instructed in exercises for general UB/LB strengthening to prevent deconditioning especially while in hospital   Shoulder Instructions      Home Living Family/patient expects to be discharged to:: Private residence Living Arrangements: Spouse/significant other;Children;Other  relatives Available Help at Discharge: Family;Available 24 hours/day Type of Home: House Home Access: Stairs to enter [employer] of Steps: 2   Home Layout: One level         Bathroom Toilet: Standard     Home Equipment: None   Additional Comments: has been independent with gait prior to this trip      Prior Functioning/Environment Level of Independence: Independent                 OT Problem List: Decreased strength;Decreased activity tolerance;Impaired balance (sitting and/or standing);Decreased knowledge of use of DME or AE;Decreased knowledge of precautions;Pain      OT Treatment/Interventions: Therapeutic exercise;Self-care/ADL training;Energy conservation;DME and/or AE instruction;Therapeutic activities;Patient/family education;Balance training    OT Goals(Current goals can be found in the care plan section) Acute Rehab OT Goals Patient Stated Goal: to get home and feel better OT Goal Formulation: With patient Time For Goal Achievement: 12/05/19 Potential to Achieve Goals: Good ADL Goals Pt Will Perform Tub/Shower Transfer: Independently;ambulating Pt/caregiver will Perform Home Exercise Program: Increased strength;Both right and left upper extremity;With theraband;Independently  OT Frequency: Min 2X/week   Barriers to D/C:            Co-evaluation              AM-PAC OT "6 Clicks" Daily Activity     Outcome Measure Help from another person eating meals?: None Help from another person taking care of personal grooming?: None Help from another person toileting, which includes using toliet, bedpan, or urinal?: None Help from another person bathing (including washing, rinsing, drying)?: A Little Help from another person to put on and taking off regular upper body clothing?: None Help from another person to put on and taking off regular lower body clothing?: A Little 6 Click Score: 22   End of Session    Activity Tolerance: Patient  tolerated treatment well Patient left: in bed;with call bell/phone within reach  OT Visit Diagnosis: Unsteadiness on feet (R26.81);Pain                Time: [id number] OT Time Calculation (min): 30 min Charges:  OT General Charges $OT Visit: 1 Visit OT Evaluation $OT Eval Low Complexity: 1 Low OT Treatments $Self Care/Home Management : 8-22 mins  Tyrone Schimke, OT Acute Rehabilitation Services Pager: [phone] Office: [phone]   Hortencia Pilar 11/21/2019, 10:23 AM

## 2019-11-21 NOTE — Progress Notes (Addendum)
Family Medicine Teaching Service Daily Progress Note Intern Pager: [phone]  Patient name: Thomas Dominguez Medical record number: [id number] Date of birth: 1945-11-12 Age: 74 y.o. Gender: male  Primary Care Provider: Benay Pike, MD Consultants: general surgery  Code Status: FULL  Pt Overview and Major Events to Date:  11/20/19:  Admitted, gen surg consulted   Assessment and Plan:  Thomas Dominguez is a 74 y.o. male presenting with 1 day history of nausea, vomiting and diffuse generalized abdominal pain consistent with small bowel obstruction. PMH is significant for SBO, A. fib, BPH, acid reflux, depression, hiatal hernia, primary hyperparathyroidism  Small bowel obstruction Patient reports last SBO about 1.5 years ago.  Denies nausea, vomiting, and flatus. KUB for SBO protocol indicated continued SBO.  General surgery considering ex-lap.  Electrolytes unremarkable.  NG tube output ~1800 mL of clear/brown fluid.  Continue NG suction and IVF.  Patient comfortable and reports decreased distension since admission.   -General surgery consulted, appreciate recs -NPO - Protonix BID -NG tube to suction -I's and O's -AM CBC and CMP -Zofran ODT every 8 hours as needed 4 mg -Dilaudid 1 mg every 4 hours as needed -Convert PO home meds to IV as able.   Atrial fibrillation Stable. Well controlled on home propafenone and diltiazem.  Working with pharmacy to convert p.o. to IV -IV metoprolol 2.5 to 5 mg every 6 hours -Continuous cardiac monitoring -Restart p.o. meds as able  Primary hyperparathyroidism. Recent sestamibi scan significant for right inferior parathyroid adenoma. Ca 9.8 today.  -Continue to monitor calcium with BMP  Depression Well controlled on home Effexor XL.  Unable to give while n.p.o. -Resume when tolerating p.o.  BPH -Hold home Flomax while NPO  Low back pain secondary to degenerative disc disease Patient takes Robaxin 700 mg 3 times daily at home and  tramadol -Continue IV Robaxin-750 milligrams 3 times daily -Hold tramadol while n.p.o.  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  Patient currently has chronic hiatal hernia that involves the colon and pancreas.  On 40 twice daily of omeprazole at home. -IV pantoprazole 40 twice daily while n.p.o. -Convert as able to p.o.  History of pulmonary infection Lungs clear on exam.  Denies SOB. Reported great improvement of aspiration PNA after completing Augmentin.  CT scan on this admission showed airspace disease in the bilateral lungs worrisome from aspiration, but this most likely reflects his chronic pulmonary infection.  FEN/GI: NPO, replete electrolytes as needed, D5 NS mIVF  PPx: Lenovox   Disposition: likely home   Subjective:  Patient denies flatus, nausea and vomiting.  Denies abdominal pain this morning as pain is well controlled.   Objective: Temp:  [98.1 F (36.7 C)-98.8 F (37.1 C)] 98.1 F (36.7 C) (05/28 0447) Pulse Rate:  [78-91] 78 (05/28 0447) Resp:  [11-18] 17 (05/28 0447) BP: (105-119)/(56-66) 106/61 (05/28 0447) SpO2:  [92 %-99 %] 92 % (05/28 0447)  Physical Exam: General: pleasant, elderly male, in no acute distress  Cardiovascular: regular rate and rhythm, distal DP pulses palpable  Respiratory: lungs clear, no increased work of breathing  Abdomen: soft, mildly distended, no rebound, no guarding  Extremities: no lower extremity tenderness or edema appreciated   Laboratory: Recent Labs  Lab 11/20/19 0232 11/21/19 0225  WBC 10.4 6.2  HGB 15.5 14.0  HCT 46.6 42.1  PLT 211 186   Recent Labs  Lab 11/20/19 0232 11/20/19  [id number] 11/21/19 0225  NA 138  --  143  K 4.5  --  3.8  CL 102  --  110  CO2 27  --  27  BUN 9  --  8  CREATININE 0.96  --  0.80  CALCIUM 10.9*  --  9.8  PROT  --  7.6 5.6*  BILITOT  --  0.7 0.5  ALKPHOS  --  145* 98  ALT   --  25 19  AST  --  29 17  GLUCOSE 121*  --  157*      Imaging/Diagnostic Tests: CT ABDOMEN PELVIS W CONTRAST  Result Date: 11/20/2019 CLINICAL DATA:  Abdominal pain with nausea and vomiting. History of esophageal cancer. Concern for bowel obstruction EXAM: CT ABDOMEN AND PELVIS WITH CONTRAST TECHNIQUE: Multidetector CT imaging of the abdomen and pelvis was performed using the standard protocol following bolus administration of intravenous contrast. CONTRAST:  157mL OMNIPAQUE IOHEXOL 300 MG/ML  SOLN COMPARISON:  None. FINDINGS: Lower chest:  Airspace disease in the lingula and right middle lobe. Esophagectomy and gastric pull-through which is moderately distended. Hepatobiliary: Simple appearing cystic density in the right liver measuring 2.5 cm.No evidence of biliary obstruction or stone. Pancreas: The body and tail of the pancreas are herniated through the esophageal hiatus. Generalized atrophy. No acute finding. Spleen: Unremarkable. Adrenals/Urinary Tract: Negative adrenals. No hydronephrosis or stone. Unremarkable bladder. Stomach/Bowel: Dilated small bowel with transition in the right paramedian mid abdomen there is some angulation of bowel loops. No evidence of bowel inflammation. Esophagectomy with gastric pull-through. Extensive distal colonic diverticulosis. A small portion of the transverse colon is herniated through the esophageal hiatus. Vascular/Lymphatic: No acute vascular abnormality. No mass or adenopathy. Reproductive:Prostate enlargement projecting into the bladder base. Other: No ascites or pneumoperitoneum. Midline hernia repair using mesh. Musculoskeletal: L2-S1 PLIF. Arthrodesis status at [id number] is uncertain. There is advanced T12-L1 and L1-2 disc degeneration. IMPRESSION: 1. Mid small bowel obstruction likely due to adhesions. 2. Airspace disease in the bilateral lungs worrisome for aspiration in this clinical setting. 3. Esophagectomy and gastric pull-through. Colon and pancreas  also herniates through the postoperative hiatus. Electronically Signed   By: Monte Fantasia M.D.   On: 11/20/2019 10:48   DG Abd Portable 1V-Small Bowel Obstruction Protocol-initial, 8 hr delay  Result Date: 11/21/2019 CLINICAL DATA:  Small bowel obstruction. EXAM: PORTABLE ABDOMEN - 1 VIEW COMPARISON:  Radiograph and CT yesterday FINDINGS: Enteric tube projects over the right lower chest within gastric pull-through. No evidence of contrast within the colon. There is a generalized paucity of bowel gas. Surgical tacks in the anterior abdomen. There is residual excreted contrast in the urinary bladder from prior CT. IMPRESSION: Generalized paucity of bowel gas without gaseous small bowel distension. There is no contrast in the colon. Electronically Signed   By: Keith Rake M.D.   On: 11/21/2019 02:35   DG Abd Portable 1V-Small Bowel Protocol-Position Verification  Result Date: 11/20/2019 CLINICAL DATA:  Enteric catheter placement, history of esophageal cancer with esophagectomy and gastric pull-through procedure EXAM: PORTABLE ABDOMEN - 1 VIEW COMPARISON:  11/20/2019 FINDINGS: Frontal view of the lower chest and upper abdomen excludes the right flank by collimation. Tip and side port of the enteric catheter are seen overlying the intrathoracic stomach seen on preceding CT, consistent with prior esophagectomy and gastric pull-through procedure. Numerous fluid-filled loops of bowel are seen within the abdomen, please refer to preceding CT report describing small-bowel obstructions. IMPRESSION: 1. Enteric catheter overlying the intrathoracic stomach secondary to gastric  pull-through procedure. Electronically Signed   By: Randa Ngo M.D.   On: 11/20/2019 15:35    Lyndee Hensen, DO 11/21/2019, 9:55 AM PGY-1, [facility] Intern pager: [phone], text pages welcome

## 2019-11-21 NOTE — Plan of Care (Signed)
  Problem: Education: Goal: Knowledge of General Education information will improve Description Including pain rating scale, medication(s)/side effects and non-pharmacologic comfort measures Outcome: Progressing   

## 2019-11-21 NOTE — Plan of Care (Signed)
  Problem: Education: Goal: Knowledge of General Education information will improve Description: Including pain rating scale, medication(s)/side effects and non-pharmacologic comfort measures Outcome: Progressing   Problem: Education: Goal: Knowledge of General Education information will improve Description: Including pain rating scale, medication(s)/side effects and non-pharmacologic comfort measures Outcome: Progressing  Patient walking in hall to stimulate peristalsis.  Problem: Activity: Goal: Risk for activity intolerance will decrease Outcome: Progressing  Patient denies SOB or abd pain when ambulating.  Problem: Pain Managment: Goal: General experience of comfort will improve Outcome: Progressing Patient states headache is controlled with dilaudid q 4 hrs.

## 2019-11-21 NOTE — Progress Notes (Signed)
[facility] Progress Note     Subjective: CC:  NAEO. States he slept well. Denies significant pain. Reports some flatus overnight but none yet this AM. Denies BM. Plans to go for a walk this AM.  Objective: Vital signs in last 24 hours: Temp:  [98.1 F (36.7 C)-98.8 F (37.1 C)] 98.1 F (36.7 C) (05/28 0447) Pulse Rate:  [78-91] 78 (05/28 0447) Resp:  [11-18] 17 (05/28 0447) BP: (105-119)/(56-66) 106/61 (05/28 0447) SpO2:  [92 %-99 %] 92 % (05/28 0447) Last BM Date: 11/19/19  Intake/Output from previous day: 05/27 0701 - 05/28 0700 In: 947.6 [I.V.:947.6] Out: 2150 [Urine:400; Emesis/NG output:1750] Intake/Output this shift: No intake/output data recorded.  PE: Gen:  Alert, NAD, pleasant Card:  Regular rate and rhythm, pedal pulses 2+ BL Pulm:  Normal effort, clear to auscultation bilaterally Abd: Soft, non-tender, mild distention, previous laparotomy scar noted, hypoactive BS  NG 1750 cc/24h clear/brown  Skin: warm and dry, no rashes  Psych: A&Ox3   Lab Results:  Recent Labs    11/20/19 0232 11/21/19 0225  WBC 10.4 6.2  HGB 15.5 14.0  HCT 46.6 42.1  PLT 211 186   BMET Recent Labs    11/20/19 0232 11/21/19 0225  NA 138 143  K 4.5 3.8  CL 102 110  CO2 27 27  GLUCOSE 121* 157*  BUN 9 8  CREATININE 0.96 0.80  CALCIUM 10.9* 9.8   PT/INR No results for input(s): LABPROT, INR in the last 72 hours. CMP     Component Value Date/Time   NA 143 11/21/2019 0225   NA 142 09/05/2019 1114   K 3.8 11/21/2019 0225   CL 110 11/21/2019 0225   CO2 27 11/21/2019 0225   GLUCOSE 157 (H) 11/21/2019 0225   BUN 8 11/21/2019 0225   BUN 12 09/05/2019 1114   CREATININE 0.80 11/21/2019 0225   CALCIUM 9.8 11/21/2019 0225   PROT 5.6 (L) 11/21/2019 0225   PROT 6.6 09/02/2019 1543   ALBUMIN 3.0 (L) 11/21/2019 0225   ALBUMIN 4.2 09/02/2019 1543   AST 17 11/21/2019 0225   ALT 19 11/21/2019 0225   ALKPHOS 98 11/21/2019 0225   BILITOT 0.5 11/21/2019 0225    BILITOT <0.2 09/02/2019 1543   GFRNONAA >60 11/21/2019 0225   GFRAA >60 11/21/2019 0225   Lipase     Component Value Date/Time   LIPASE 33 11/20/2019 0232       Studies/Results: DG Chest 2 View  Result Date: 11/20/2019 CLINICAL DATA:  Chest pain. EXAM: CHEST - 2 VIEW COMPARISON:  Radiograph 09/02/2019. CT 09/22/2019 FINDINGS: Unchanged mediastinal contours. Smoothly marginated density about the right mediastinum corresponds to esophageal pull-through. This is unchanged from prior exam. Left retrocardiac opacity corresponds to diaphragmatic hernia on CT which contained colon. No acute airspace disease. No pulmonary edema. No pneumothorax or pleural effusion. No acute osseous abnormalities are seen. IMPRESSION: 1. No acute abnormality. 2. Stable appearance of esophageal pull-through. Electronically Signed   By: Keith Rake M.D.   On: 11/20/2019 02:54   CT ABDOMEN PELVIS W CONTRAST  Result Date: 11/20/2019 CLINICAL DATA:  Abdominal pain with nausea and vomiting. History of esophageal cancer. Concern for bowel obstruction EXAM: CT ABDOMEN AND PELVIS WITH CONTRAST TECHNIQUE: Multidetector CT imaging of the abdomen and pelvis was performed using the standard protocol following bolus administration of intravenous contrast. CONTRAST:  1106mL OMNIPAQUE IOHEXOL 300 MG/ML  SOLN COMPARISON:  None. FINDINGS: Lower chest:  Airspace disease in the lingula and right middle lobe. Esophagectomy  and gastric pull-through which is moderately distended. Hepatobiliary: Simple appearing cystic density in the right liver measuring 2.5 cm.No evidence of biliary obstruction or stone. Pancreas: The body and tail of the pancreas are herniated through the esophageal hiatus. Generalized atrophy. No acute finding. Spleen: Unremarkable. Adrenals/Urinary Tract: Negative adrenals. No hydronephrosis or stone. Unremarkable bladder. Stomach/Bowel: Dilated small bowel with transition in the right paramedian mid abdomen there is  some angulation of bowel loops. No evidence of bowel inflammation. Esophagectomy with gastric pull-through. Extensive distal colonic diverticulosis. A small portion of the transverse colon is herniated through the esophageal hiatus. Vascular/Lymphatic: No acute vascular abnormality. No mass or adenopathy. Reproductive:Prostate enlargement projecting into the bladder base. Other: No ascites or pneumoperitoneum. Midline hernia repair using mesh. Musculoskeletal: L2-S1 PLIF. Arthrodesis status at [id number] is uncertain. There is advanced T12-L1 and L1-2 disc degeneration. IMPRESSION: 1. Mid small bowel obstruction likely due to adhesions. 2. Airspace disease in the bilateral lungs worrisome for aspiration in this clinical setting. 3. Esophagectomy and gastric pull-through. Colon and pancreas also herniates through the postoperative hiatus. Electronically Signed   By: Monte Fantasia M.D.   On: 11/20/2019 10:48   NM Parathyroid W/Spect  Result Date: 11/19/2019 CLINICAL DATA:  Hyperparathyroidism, elevated calcium and parathormone levels EXAM: NM PARATHYROID SCINTIGRAPHY AND SPECT IMAGING TECHNIQUE: Following intravenous administration of radiopharmaceutical, early and 2-hour delayed planar images were obtained in the anterior projection. Delayed triplanar SPECT images were also obtained at 2 hours. RADIOPHARMACEUTICALS:  25.1 mCi Tc-66m Sestamibi IV COMPARISON:  Thyroid ultrasound 11/19/2019 Correlation: CT chest 09/22/2019 FINDINGS: Planar imaging: Normal initial distribution of sestamibi in the thyroid lobes. Normal washout of tracer from thyroid tissue on delayed image. Questionable subtle focus of abnormal sestamibi retention at the inferior pole of the RIGHT thyroid lobe. SPECT imaging: Abnormal sestamibi retention at the expected position of the RIGHT inferior parathyroid gland suspicious for parathyroid adenoma. No abnormal tracer retention at the remaining parathyroid gland locations. Present on both planar and  SPECT imaging is abnormal tracer accumulation in the medial RIGHT lower chest extending to the mid mediastinum; this corresponds to gastric pull up identified on chest CT as result of prior esophagectomy. 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 Gastric pull up. Electronically Signed   By: Lavonia Dana M.D.   On: 11/19/2019 13:46   DG Abd Portable 1V-Small Bowel Obstruction Protocol-initial, 8 hr delay  Result Date: 11/21/2019 CLINICAL DATA:  Small bowel obstruction. EXAM: PORTABLE ABDOMEN - 1 VIEW COMPARISON:  Radiograph and CT yesterday FINDINGS: Enteric tube projects over the right lower chest within gastric pull-through. No evidence of contrast within the colon. There is a generalized paucity of bowel gas. Surgical tacks in the anterior abdomen. There is residual excreted contrast in the urinary bladder from prior CT. IMPRESSION: Generalized paucity of bowel gas without gaseous small bowel distension. There is no contrast in the colon. Electronically Signed   By: Keith Rake M.D.   On: 11/21/2019 02:35   DG Abd Portable 1V-Small Bowel Protocol-Position Verification  Result Date: 11/20/2019 CLINICAL DATA:  Enteric catheter placement, history of esophageal cancer with esophagectomy and gastric pull-through procedure EXAM: PORTABLE ABDOMEN - 1 VIEW COMPARISON:  11/20/2019 FINDINGS: Frontal view of the lower chest and upper abdomen excludes the right flank by collimation. Tip and side port of the enteric catheter are seen overlying the intrathoracic stomach seen on preceding CT, consistent with prior esophagectomy and gastric pull-through procedure. Numerous fluid-filled loops of bowel are seen within the abdomen, please  refer to preceding CT report describing small-bowel obstructions. IMPRESSION: 1. Enteric catheter overlying the intrathoracic stomach secondary to gastric pull-through procedure. Electronically Signed   By: Randa Ngo M.D.   On: 11/20/2019  15:35   US THYROID  Result Date: 11/19/2019 CLINICAL DATA:  Other.  Primary hyperparathyroidism. EXAM: THYROID ULTRASOUND TECHNIQUE: Ultrasound examination of the thyroid gland and adjacent soft tissues was performed. COMPARISON:  None. FINDINGS: Parenchymal Echotexture: Normal Isthmus: Normal in size measuring 0.3 cm in diameter Right lobe: Normal in size measuring 5.1 x 2.0 x 2.1 cm Left lobe: Normal in size measuring 5.0 x 1.2 x 1.9 cm _________________________________________________________ Estimated total number of nodules >/= 1 cm: 0 Number of spongiform nodules >/=  2 cm not described below (TR1): 0 Number of mixed cystic and solid nodules >/= 1.5 cm not described below (TR2): 0 _________________________________________________________ There is an approximately 0.6 cm hypoechoic nodule within the mid aspect the right lobe of the thyroid (labeled 1), which does not meet criteria to recommend percutaneous sampling or continued dedicated follow-up. There is a punctate (approximately 0.7 cm) hypoechoic nodule within the inferior pole the right lobe of the thyroid (labeled 2), which does not meet criteria to recommend percutaneous sampling or continued dedicated follow-up. _________________________________________________________ Adjacent to the inferior pole of the right lobe of the thyroid is an approximately 1.2 x 1.2 x 0.6 cm hypoechoic nodule which appears to contain a central echogenic hilum (images 24 through 30). _________________________________________________________ There is an approximately 0.5 cm anechoic cyst within mid aspect the left lobe of the thyroid (labeled 3), which does not meet criteria to recommend percutaneous sampling or continued dedicated follow-up There is an approximately 0.8 cm partially cystic though predominantly solid hypoechoic nodule within the inferior pole the left lobe of the thyroid (labeled 4), which does not meet criteria to recommend percutaneous sampling or  dedicated follow-up There is an approximately 0.5 cm hypoechoic nodule within mid aspect the left lobe of the thyroid (labeled 5), not meet criteria to recommend percutaneous sampling or continued dedicated follow-up. IMPRESSION: 1. Mildly heterogeneous appearing but normal sized thyroid gland without worrisome thyroid nodule or mass. 2. Adjacent to the inferior pole of the right lobe of the thyroid is an approximately 1.2 cm hypoechoic nodule which may represent a cervical lymph node (given presence of apparent echogenic hilum), though given provided history of hyperthyroidism, conceivably a parathyroid adenoma could have a similar appearance. Further evaluation with nuclear medicine parathyroid scintigraphy and/or contrast-enhanced neck CT performed as indicated. The above is in keeping with the ACR TI-RADS recommendations - J Am Coll Radiol 2017;14:587-595. Electronically Signed   By: Sandi Mariscal M.D.   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 Recurrent ventral hernias with multiple repairs; including mesh 1990s - afebrile, WBC WNL - SB protocol 5/27, KUB overnight with contrast in the small bowel, repeat KUB at 1500 - NG with 1750 cc output/24h - minimal flatus, no BM. Await return of bowel function - OOB/ambulate   FEN: NPO, IVF, NG to LIWS  ID: None DVT: daily Lovenox Follow-up: TBD  Plan: remains clinically obstructed, follow NG output and KUB 1500    LOS: 1 day    Obie Dredge, [facility]  Please see Amion for pager number during day hours 7:00am-4:30pm

## 2019-11-21 NOTE — Progress Notes (Addendum)
Patient returned from ambulating in hall, RN noted that NG tube looked longer than normal. Suction turned off, Xray tech arriving to take follow up imaging. Patient denies distress or abdominal pain at this time. MD notified. MD advised to advance NG tube and obtain xray for placement.

## 2019-11-21 NOTE — Progress Notes (Signed)
After advancing NG tube, imaging obtained. Call to Dr. Rosendo Gros who stated tube may be in lung, Dr. Rosendo Gros advised to remove and insert a new NG tube. NG tube removed.

## 2019-11-21 NOTE — Progress Notes (Signed)
Physical Therapy Treatment Patient Details Name: Thomas Dominguez. [place] MRN: [id number] DOB: Mar 29, 1946 Today's Date: 11/21/2019    History of Present Illness 74 yo male with onset of abd pain and N&V was admitted for SBO, now with NG tube and weakness.  PMHx:  SBO, diverticulitis, pulmonary infection, depression, a-fib, lumbar DDD with fusion, Barrett's esophagus, esophageal CA, DVT    PT Comments    Patient walking well in hallway mod I; no evidence of imbalance with head turns or changes in direction. HR in low 100s bpm. Tolerated stair training without difficulty with handrail for support. Eagerly awaiting flatus or a BM. Has been walking with wife which this PT encouraged as much as tolerated. Pt is functioning at baseline and reports no functional deficits. All education has been completed and pt has met all goals. Safe to d/c from therapy.   Follow Up Recommendations  No PT follow up;Supervision - Intermittent     Equipment Recommendations  None recommended by PT    Recommendations for Other Services       Precautions / Restrictions Precautions Precautions: None Precaution Comments: NG tube Restrictions Weight Bearing Restrictions: No    Mobility  Bed Mobility               General bed mobility comments: Seen walking in hallway.  Transfers Overall transfer level: Needs assistance Equipment used: None Transfers: Sit to/from Stand Sit to Stand: Modified independent (Device/Increase time)            Ambulation/Gait Ambulation/Gait assistance: Independent Gait Distance (Feet): 1000 Feet Assistive device: None Gait Pattern/deviations: WFL(Within Functional Limits)   Gait velocity interpretation: >2.62 ft/sec, indicative of community ambulatory General Gait Details: Steady gait with no evidence of imbalance. HR stable in low 100s.   Stairs Stairs: Yes Stairs assistance: Modified independent (Device/Increase time) Stair Management: One rail Right;Alternating  pattern Number of Stairs: 3 General stair comments: Cues for safety, no issues   Wheelchair Mobility    Modified Rankin (Stroke Patients Only)       Balance Overall balance assessment: No apparent balance deficits (not formally assessed)                                          Cognition Arousal/Alertness: Awake/alert Behavior During Therapy: WFL for tasks assessed/performed Overall Cognitive Status: Within Functional Limits for tasks assessed                                        Exercises      General Comments General comments (skin integrity, edema, etc.): Wife present for session and walking with pt as well.      Pertinent Vitals/Pain Pain Assessment: No/denies pain    Home Living                      Prior Function            PT Goals (current goals can now be found in the care plan section) Progress towards PT goals: Goals met/education completed, patient discharged from PT    Frequency    Min 3X/week      PT Plan Current plan remains appropriate    Co-evaluation              AM-PAC PT "6 Clicks" Mobility  Outcome Measure  Help needed turning from your back to your side while in a flat bed without using bedrails?: None Help needed moving from lying on your back to sitting on the side of a flat bed without using bedrails?: None Help needed moving to and from a bed to a chair (including a wheelchair)?: None Help needed standing up from a chair using your arms (e.g., wheelchair or bedside chair)?: None Help needed to walk in hospital room?: None Help needed climbing 3-5 steps with a railing? : None 6 Click Score: 24    End of Session   Activity Tolerance: Patient tolerated treatment well Patient left: Other (comment)(standing in hallway) Nurse Communication: Mobility status PT Visit Diagnosis: Unsteadiness on feet (R26.81);Difficulty in walking, not elsewhere classified (R26.2)     Time:  3202-3343 PT Time Calculation (min) (ACUTE ONLY): 11 min  Charges:  $Therapeutic Exercise: 8-22 mins                     Marisa Severin, PT, DPT Acute Rehabilitation Services Pager [phone] Office [place] 11/21/2019, 1:00 PM

## 2019-11-22 DIAGNOSIS — Z9889 Other specified postprocedural states: Secondary | ICD-10-CM

## 2019-11-22 DIAGNOSIS — Z9049 Acquired absence of other specified parts of digestive tract: Secondary | ICD-10-CM

## 2019-11-22 HISTORY — DX: Acquired absence of other specified parts of digestive tract: Z90.49

## 2019-11-22 HISTORY — DX: Other specified postprocedural states: Z98.890

## 2019-11-22 LAB — COMPREHENSIVE METABOLIC PANEL
ALT: 18 U/L (ref 0–44)
AST: 16 U/L (ref 15–41)
Albumin: 3.6 g/dL (ref 3.5–5.0)
Alkaline Phosphatase: 118 U/L (ref 38–126)
Anion gap: 5 (ref 5–15)
BUN: 7 mg/dL — ABNORMAL LOW (ref 8–23)
CO2: 29 mmol/L (ref 22–32)
Calcium: 10.6 mg/dL — ABNORMAL HIGH (ref 8.9–10.3)
Chloride: 106 mmol/L (ref 98–111)
Creatinine, Ser: 0.9 mg/dL (ref 0.61–1.24)
GFR calc Af Amer: >60 mL/min (ref >60)
GFR calc non Af Amer: >60 mL/min (ref >60)
Glucose, Bld: 117 mg/dL — ABNORMAL HIGH (ref 70–99)
Potassium: 4 mmol/L (ref 3.5–5.1)
Sodium: 140 mmol/L (ref 135–145)
Total Bilirubin: 0.6 mg/dL (ref 0.3–1.2)
Total Protein: 6.6 g/dL (ref 6.5–8.1)

## 2019-11-22 MED ORDER — ACETAMINOPHEN 10 MG/ML IV SOLN
1000.0000 mg | Freq: Four times a day (QID) | INTRAVENOUS | Status: AC | PRN
  Administered 2019-11-22 (×2): 1000 mg via INTRAVENOUS
  Filled 2019-11-22 (×2): qty 100

## 2019-11-22 NOTE — Progress Notes (Signed)
Family Medicine Teaching Service Daily Progress Note Intern Pager: [phone]  Patient name: Thomas Dominguez. [place] Medical record number: [id number] Date of birth: [birth year] Age: 74 y.o. Gender: male  Primary Care Provider: Benay Pike, MD Consultants: general surgery  Code Status: FULL  Pt Overview and Major Events to Date:  11/20/19:  Admitted, gen surg consulted   Assessment and Plan:  Thomas Dominguez is a 74 y.o. male presenting with 1 day history of nausea, vomiting and diffuse generalized abdominal pain consistent with small bowel obstruction. PMH is significant for SBO, A. fib, BPH, acid reflux, depression, hiatal hernia, primary hyperparathyroidism  Small bowel obstruction Abdominal pain and nausea well controlled. Abdominal distension improved compared to admission with NG suction. NG tube output: 1.7L overnight, draining bilious liquid. Reports nightmares after dilaudid so refused further doses. Patient prefers not to have morphine d/t previous side effects. IV Tylenol helped overnight. If abdominal pain worsens would consider IV Toradol (pt has allergy to Meloxicam).  -General surgery consulted, appreciate recs    -Considering exploratory laparotomy however would be difficult due to previous     multiple surgeries and ventral hernia repairs.   -NPO -Continue D5%-0.9% 165ml/hr - Protonix BID -NG tube to suction -I's and O's  -Zofran 4mg  PO Q8PRN -Tylenol 1g Q6PRN  -D/c dilaudid due to side effects -Convert PO home meds to IV as able.  Atrial fibrillation HR 57-74 Home meds: Propafenone 225mg  TID and diltiazem 120mg  daily.  -Hold propafenonem and restart as able when able to tolerate PO  -IV metoprolol 2.5 to 5 mg every 6 hours -Continuous cardiac monitoring -Restart p.o. meds as able  Primary hyperparathyroidism. Ca 9.8 on 5/28.  Recent sestamibi scan significant for right inferior parathyroid adenoma.  -Continue to monitor calcium with BMP  Depression Well  controlled on home Effexor XL.  Unable to give while n.p.o. -Resume when tolerating p.o.  BPH -Hold home Flomax while NPO  Low back pain secondary to degenerative disc disease Patient takes Robaxin 700 mg 3 times daily at home and tramadol -Continue IV Robaxin-750 milligrams 3 times daily -Hold tramadol while n.p.o.   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  Patient currently has chronic hiatal hernia that involves the colon and pancreas.  On 40 twice daily of omeprazole at home. -IV pantoprazole 40 twice daily while n.p.o. -Convert as able to p.o.  History of pulmonary infection sats 98% on RA   Reported great improvement of aspiration PNA after completing Augmentin.  CT scan on this admission showed airspace disease in the bilateral lungs worrisome from aspiration, but this most likely reflects his chronic pulmonary infection. -Pt due for repeat CT chest later this week following pneumonia. Follows [facility]  FEN/GI: NPO, replete electrolytes as needed, D5 NS mIVF  PPx: Lenovox   Disposition: Likely home   Subjective:  Pt is doing well this morning. Abdominal pain and nausea are well controlled. Had a headache overnight for which RN gave dilaudid, after receiving this patient reports nightmares. Refused further dilaudid so I prescribed IV tylenol which helped with the headache.   Objective: Temp:  [97.8 F (36.6 C)-98.5 F (36.9 C)] 97.8 F (36.6 C) (05/29 0544) Pulse Rate:  [57-74] 57 (05/29 0544) Resp:  [17-18] 17 (05/29 0544) BP: (103-139)/(56-78) 120/65 (05/29 0544) SpO2:  [95 %-100 %] 98 % (05/29 0544)  Physical Exam: General: alert, pleasant, no acute distress, NG suction in  place  Cardio: Normal S1 and S2, RRR. No murmurs or rubs.   Pulm: CTAB, normal WOB Abdomen: Abdomen mildly distended, soft, non tender, bowel sounds present  Extremities: No peripheral edema.  Warm/ well perfused.  Strong radial pulse Neuro: Cranial nerves grossly intact  Laboratory: Recent Labs  Lab 11/20/19 0232 11/21/19 0225  WBC 10.4 6.2  HGB 15.5 14.0  HCT 46.6 42.1  PLT 211 186   Recent Labs  Lab 11/20/19 0232 11/20/19 0752 11/21/19 0225  NA 138  --  143  K 4.5  --  3.8  CL 102  --  110  CO2 27  --  27  BUN 9  --  8  CREATININE 0.96  --  0.80  CALCIUM 10.9*  --  9.8  PROT  --  7.6 5.6*  BILITOT  --  0.7 0.5  ALKPHOS  --  145* 98  ALT  --  25 19  AST  --  29 17  GLUCOSE 121*  --  157*      Imaging/Diagnostic Tests: DG Abd 1 View  Result Date: 11/21/2019 CLINICAL DATA:  NG tube placement EXAM: ABDOMEN - 1 VIEW COMPARISON:  11/21/2019 FINDINGS: Enteric tube tip over the right upper quadrant presumably within gastric pull-through. Visible gas pattern at the abdomen is non obstructed. IMPRESSION: Enteric tube tip overlies the right upper quadrant presumably within patient's gastric pull-through Electronically Signed   By: Donavan Foil M.D.   On: 11/21/2019 19:58   DG Abd Portable 1V  Result Date: 11/21/2019 CLINICAL DATA:  Bowel obstruction tube placement EXAM: PORTABLE ABDOMEN - 1 VIEW COMPARISON:  11/21/2019, 11/20/2019, CT 11/20/2019, radiograph 11/20/2019, 09/02/2019 FINDINGS: Esophageal tube tip and side port overlie the right lower chest, likely within gastric pull-through. Possible small left pleural effusion. No dilated small bowel over the abdomen. Prior hernia repair. Hardware in the lumbosacral spine. Large hiatal hernia IMPRESSION: 1. Enteric tube tip and side-port project over the right lower chest, presumably within gastric pull-through 2. No dilated bowel over the abdomen 3. Large hiatal hernia Electronically Signed   By: Donavan Foil M.D.   On: 11/21/2019 19:57   DG Abd Portable 1V  Result Date: 11/21/2019 CLINICAL DATA:  74 year old male with small bowel obstruction. EXAM: PORTABLE ABDOMEN - 1 VIEW COMPARISON:  Earlier radiograph dated  11/21/2019. FINDINGS: No bowel dilatation or evidence of obstruction. Moderate stool throughout the colon. An enteric tube is not seen in the visualized abdomen. Ventral hernia repair and postsurgical changes of the lumbar spine. No acute osseous pathology. IMPRESSION: No bowel dilatation.  Nonvisualization of enteric tube. Electronically Signed   By: Anner Crete M.D.   On: 11/21/2019 19:41    Lattie Haw, MD 11/22/2019, 6:24 AM PGY-1, [place] Intern pager: [phone], text pages welcome

## 2019-11-22 NOTE — Progress Notes (Signed)
Patient ID: Thomas Dominguez, male   DOB: 1946/04/30, 74 y.o.   MRN: [id number] [facility] Surgery Progress Note:   * No surgery found *  Subjective: Mental status is clear; .  Complaints none. Objective: Vital signs in last 24 hours: Temp:  [97.8 F (36.6 C)-98.5 F (36.9 C)] 97.8 F (36.6 C) (05/29 0544) Pulse Rate:  [57-74] 57 (05/29 0544) Resp:  [17-18] 17 (05/29 0544) BP: (103-139)/(56-78) 120/65 (05/29 0544) SpO2:  [95 %-100 %] 98 % (05/29 0544)  Intake/Output from previous day: 05/28 0701 - 05/29 0700 In: 2365.1 [I.V.:2363.7; IV Piggyback:1.4] Out: 2650 [Urine:1850; Emesis/NG output:800] Intake/Output this shift: No intake/output data recorded.  Physical Exam: Work of breathing is normal.  Abdomen is mildly distended and nontender. No flatus yet  Lab Results:  Results for orders placed or performed during the hospital encounter of 11/20/19 (from the past 48 hour(s))  SARS Coronavirus 2 by RT PCR (hospital order, performed in [facility] hospital lab) Nasopharyngeal Nasopharyngeal Swab     Status: None   Collection Time: 11/20/19  4:47 PM   Specimen: Nasopharyngeal Swab  Result Value Ref Range   SARS Coronavirus 2 NEGATIVE NEGATIVE    Comment: (NOTE) SARS-CoV-2 target nucleic acids are NOT DETECTED. The SARS-CoV-2 RNA is generally detectable in upper and lower respiratory specimens during the acute phase of infection. The lowest concentration of SARS-CoV-2 viral copies this assay can detect is 250 copies / mL. A negative result does not preclude SARS-CoV-2 infection and should not be used as the sole basis for treatment or other patient management decisions.  A negative result may occur with improper specimen collection / handling, submission of specimen other than nasopharyngeal swab, presence of viral mutation(s) within the areas targeted by this assay, and inadequate number of viral copies (<250 copies / mL). A negative result must be combined with  clinical observations, patient history, and epidemiological information. Fact Sheet for Patients:   StrictlyIdeas.no Fact Sheet for Healthcare Providers: BankingDealers.co.za This test is not yet approved or cleared  by the [place] FDA and has been authorized for detection and/or diagnosis of SARS-CoV-2 by FDA under an Emergency Use Authorization (EUA).  This EUA will remain in effect (meaning this test can be used) for the duration of the COVID-19 declaration under Section 564(b)(1) of the Act, 21 U.S.C. section 360bbb-3(b)(1), unless the authorization is terminated or revoked sooner. Performed at [facility], [place] [address], [place] [address]   CBC     Status: None   Collection Time: 11/21/19  2:25 AM  Result Value Ref Range   WBC 6.2 4.0 - 10.5 K/uL   RBC 4.26 4.22 - 5.81 MIL/uL   Hemoglobin 14.0 13.0 - 17.0 g/dL   HCT 42.1 39.0 - 52.0 %   MCV 98.8 80.0 - 100.0 fL   MCH 32.9 26.0 - 34.0 pg   MCHC 33.3 30.0 - 36.0 g/dL   RDW 13.3 11.5 - 15.5 %   Platelets 186 150 - 400 K/uL   nRBC 0.0 0.0 - 0.2 %    Comment: Performed at [facility], [place] [address] [place], [place] [address]  Comprehensive metabolic panel     Status: Abnormal   Collection Time: 11/21/19  2:25 AM  Result Value Ref Range   Sodium 143 135 - 145 mmol/L   Potassium 3.8 3.5 - 5.1 mmol/L   Chloride 110 98 - 111 mmol/L   CO2 27 22 - 32 mmol/L   Glucose,  Bld 157 (H) 70 - 99 mg/dL    Comment: Glucose reference range applies only to samples taken after fasting for at least 8 hours.   BUN 8 8 - 23 mg/dL   Creatinine, Ser 0.80 0.61 - 1.24 mg/dL   Calcium 9.8 8.9 - 10.3 mg/dL   Total Protein 5.6 (L) 6.5 - 8.1 g/dL   Albumin 3.0 (L) 3.5 - 5.0 g/dL   AST 17 15 - 41 U/L   ALT 19 0 - 44 U/L   Alkaline Phosphatase 98 38 - 126 U/L   Total Bilirubin 0.5 0.3 - 1.2 mg/dL   GFR calc non Af Amer >60 >60 mL/min   GFR calc Af Amer >60 >60 mL/min    Anion gap 6 5 - 15    Comment: Performed at [facility], [place] [address], [place], [place] [address]    Radiology/Results: DG Abd 1 View  Result Date: 11/21/2019 CLINICAL DATA:  NG tube placement EXAM: ABDOMEN - 1 VIEW COMPARISON:  11/21/2019 FINDINGS: Enteric tube tip over the right upper quadrant presumably within gastric pull-through. Visible gas pattern at the abdomen is non obstructed. IMPRESSION: Enteric tube tip overlies the right upper quadrant presumably within patient's gastric pull-through Electronically Signed   By: Donavan Foil M.D.   On: 11/21/2019 19:58   CT ABDOMEN PELVIS W CONTRAST  Result Date: 11/20/2019 CLINICAL DATA:  Abdominal pain with nausea and vomiting. History of esophageal cancer. Concern for bowel obstruction EXAM: CT ABDOMEN AND PELVIS WITH CONTRAST TECHNIQUE: Multidetector CT imaging of the abdomen and pelvis was performed using the standard protocol following bolus administration of intravenous contrast. CONTRAST:  160mL OMNIPAQUE IOHEXOL 300 MG/ML  SOLN COMPARISON:  None. FINDINGS: Lower chest:  Airspace disease in the lingula and right middle lobe. Esophagectomy and gastric pull-through which is moderately distended. Hepatobiliary: Simple appearing cystic density in the right liver measuring 2.5 cm.No evidence of biliary obstruction or stone. Pancreas: The body and tail of the pancreas are herniated through the esophageal hiatus. Generalized atrophy. No acute finding. Spleen: Unremarkable. Adrenals/Urinary Tract: Negative adrenals. No hydronephrosis or stone. Unremarkable bladder. Stomach/Bowel: Dilated small bowel with transition in the right paramedian mid abdomen there is some angulation of bowel loops. No evidence of bowel inflammation. Esophagectomy with gastric pull-through. Extensive distal colonic diverticulosis. A small portion of the transverse colon is herniated through the esophageal hiatus. Vascular/Lymphatic: No acute vascular abnormality. No mass or  adenopathy. Reproductive:Prostate enlargement projecting into the bladder base. Other: No ascites or pneumoperitoneum. Midline hernia repair using mesh. Musculoskeletal: L2-S1 PLIF. Arthrodesis status at [id number] is uncertain. There is advanced T12-L1 and L1-2 disc degeneration. IMPRESSION: 1. Mid small bowel obstruction likely due to adhesions. 2. Airspace disease in the bilateral lungs worrisome for aspiration in this clinical setting. 3. Esophagectomy and gastric pull-through. Colon and pancreas also herniates through the postoperative hiatus. Electronically Signed   By: Monte Fantasia M.D.   On: 11/20/2019 10:48   DG Abd Portable 1V  Result Date: 11/21/2019 CLINICAL DATA:  Bowel obstruction tube placement EXAM: PORTABLE ABDOMEN - 1 VIEW COMPARISON:  11/21/2019, 11/20/2019, CT 11/20/2019, radiograph 11/20/2019, 09/02/2019 FINDINGS: Esophageal tube tip and side port overlie the right lower chest, likely within gastric pull-through. Possible small left pleural effusion. No dilated small bowel over the abdomen. Prior hernia repair. Hardware in the lumbosacral spine. Large hiatal hernia IMPRESSION: 1. Enteric tube tip and side-port project over the right lower chest, presumably within gastric pull-through 2. No dilated bowel over the abdomen 3.  Large hiatal hernia Electronically Signed   By: Donavan Foil M.D.   On: 11/21/2019 19:57   DG Abd Portable 1V  Result Date: 11/21/2019 CLINICAL DATA:  74 year old male with small bowel obstruction. EXAM: PORTABLE ABDOMEN - 1 VIEW COMPARISON:  Earlier radiograph dated 11/21/2019. FINDINGS: No bowel dilatation or evidence of obstruction. Moderate stool throughout the colon. An enteric tube is not seen in the visualized abdomen. Ventral hernia repair and postsurgical changes of the lumbar spine. No acute osseous pathology. IMPRESSION: No bowel dilatation.  Nonvisualization of enteric tube. Electronically Signed   By: Anner Crete M.D.   On: 11/21/2019 19:41   DG Abd  Portable 1V-Small Bowel Obstruction Protocol-initial, 8 hr delay  Result Date: 11/21/2019 CLINICAL DATA:  Small bowel obstruction. EXAM: PORTABLE ABDOMEN - 1 VIEW COMPARISON:  Radiograph and CT yesterday FINDINGS: Enteric tube projects over the right lower chest within gastric pull-through. No evidence of contrast within the colon. There is a generalized paucity of bowel gas. Surgical tacks in the anterior abdomen. There is residual excreted contrast in the urinary bladder from prior CT. IMPRESSION: Generalized paucity of bowel gas without gaseous small bowel distension. There is no contrast in the colon. Electronically Signed   By: Keith Rake M.D.   On: 11/21/2019 02:35   DG Abd Portable 1V-Small Bowel Protocol-Position Verification  Result Date: 11/20/2019 CLINICAL DATA:  Enteric catheter placement, history of esophageal cancer with esophagectomy and gastric pull-through procedure EXAM: PORTABLE ABDOMEN - 1 VIEW COMPARISON:  11/20/2019 FINDINGS: Frontal view of the lower chest and upper abdomen excludes the right flank by collimation. Tip and side port of the enteric catheter are seen overlying the intrathoracic stomach seen on preceding CT, consistent with prior esophagectomy and gastric pull-through procedure. Numerous fluid-filled loops of bowel are seen within the abdomen, please refer to preceding CT report describing small-bowel obstructions. IMPRESSION: 1. Enteric catheter overlying the intrathoracic stomach secondary to gastric pull-through procedure. Electronically Signed   By: Randa Ngo M.D.   On: 11/20/2019 15:35    Anti-infectives: Anti-infectives (From admission, onward)   None      Assessment/Plan: Problem List: Patient Active Problem List   Diagnosis Date Noted  . History of esophagectomy 90s at [facility] for severe Barrett's with microfocus of cancer 11/22/2019  . SBO (small bowel obstruction) ([place]) 11/20/2019  . Acute ataxia 09/30/2019  . New daily persistent headache  09/30/2019  . Primary hyperparathyroidism ([place]) 09/30/2019  . Dyspnea on exertion 09/05/2019  . Hyperlipidemia 09/05/2019  . Leg swelling 01/17/2019  . Atrial fibrillation ([place]) 01/17/2019  . Acid reflux disease 01/17/2019  . H/O small bowel obstruction 01/17/2019  . Hiatal hernia 01/17/2019  . Depression, major, single episode, complete remission ([place]) 01/17/2019  . BPH (benign prostatic hyperplasia) 01/17/2019  . Degenerative disc disease, lumbar 01/17/2019  . Diverticulosis 01/17/2019    Hx of esophagectomy by [place] at [facility] in the 90s.  Multiple abdominal operations with mesh for herniae and SBOs.  NG in place and trying to allow this to resolve.   * No surgery found *    LOS: 2 days   Matt B. Hassell Done, MD, [facility] [phone] to reach the surgeon on call.    11/22/2019 8:46 AM

## 2019-11-23 LAB — BASIC METABOLIC PANEL
Anion gap: 6 (ref 5–15)
BUN: 8 mg/dL (ref 8–23)
CO2: 28 mmol/L (ref 22–32)
Calcium: 10 mg/dL (ref 8.9–10.3)
Chloride: 108 mmol/L (ref 98–111)
Creatinine, Ser: 0.9 mg/dL (ref 0.61–1.24)
GFR calc Af Amer: >60 mL/min (ref >60)
GFR calc non Af Amer: >60 mL/min (ref >60)
Glucose, Bld: 122 mg/dL — ABNORMAL HIGH (ref 70–99)
Potassium: 3.7 mmol/L (ref 3.5–5.1)
Sodium: 142 mmol/L (ref 135–145)

## 2019-11-23 LAB — CBC WITH DIFFERENTIAL/PLATELET
Abs Immature Granulocytes: 0.02 10*3/uL (ref 0.00–0.07)
Basophils Absolute: 0.1 10*3/uL (ref 0.0–0.1)
Basophils Relative: 1 %
Eosinophils Absolute: 0.2 10*3/uL (ref 0.0–0.5)
Eosinophils Relative: 4 %
HCT: 41.3 % (ref 39.0–52.0)
Hemoglobin: 13.7 g/dL (ref 13.0–17.0)
Immature Granulocytes: 0 %
Lymphocytes Relative: 24 %
Lymphs Abs: 1.2 10*3/uL (ref 0.7–4.0)
MCH: 32.6 pg (ref 26.0–34.0)
MCHC: 33.2 g/dL (ref 30.0–36.0)
MCV: 98.3 fL (ref 80.0–100.0)
Monocytes Absolute: 0.6 10*3/uL (ref 0.1–1.0)
Monocytes Relative: 13 %
Neutro Abs: 3 10*3/uL (ref 1.7–7.7)
Neutrophils Relative %: 58 %
Platelets: 164 10*3/uL (ref 150–400)
RBC: 4.2 MIL/uL — ABNORMAL LOW (ref 4.22–5.81)
RDW: 12.5 % (ref 11.5–15.5)
WBC: 5.1 10*3/uL (ref 4.0–10.5)
nRBC: 0 % (ref 0.0–0.2)

## 2019-11-23 MED ORDER — DILTIAZEM HCL 60 MG PO TABS
120.0000 mg | ORAL_TABLET | Freq: Every day | ORAL | Status: DC
  Administered 2019-11-24: 120 mg via ORAL
  Filled 2019-11-23: qty 2

## 2019-11-23 MED ORDER — VENLAFAXINE HCL ER 75 MG PO CP24
150.0000 mg | ORAL_CAPSULE | Freq: Every day | ORAL | Status: DC
  Administered 2019-11-24: 150 mg via ORAL
  Filled 2019-11-23 (×2): qty 2

## 2019-11-23 MED ORDER — PROPAFENONE HCL 225 MG PO TABS
225.0000 mg | ORAL_TABLET | Freq: Three times a day (TID) | ORAL | Status: DC
  Administered 2019-11-24 (×2): 225 mg via ORAL
  Filled 2019-11-23 (×3): qty 1

## 2019-11-23 MED ORDER — PANTOPRAZOLE SODIUM 40 MG PO TBEC
40.0000 mg | DELAYED_RELEASE_TABLET | Freq: Two times a day (BID) | ORAL | Status: DC
  Administered 2019-11-23 – 2019-11-24 (×2): 40 mg via ORAL
  Filled 2019-11-23 (×2): qty 1

## 2019-11-23 MED ORDER — METHOCARBAMOL 750 MG PO TABS: 750.0000 mg | ORAL_TABLET | Freq: Three times a day (TID) | ORAL | Status: DC | PRN

## 2019-11-23 NOTE — Progress Notes (Signed)
Pt doing well with clamped NGT, no nausea or vomiting. Will continue to monitor.

## 2019-11-23 NOTE — Progress Notes (Signed)
Spoke with pt's nurse regarding his NG tube.  Patient not complaining of nausea with diet.  Asked nurse to pull his NG tube.   The following changes to medications were made:  - robaxin made PO tomorrow am.  - restarted home oral antiarrhythmics for tomorrow.  - set metoprolol iv to end after midnight dose tonight - d/c iv fluids at [place].  - restarted effexor in AM.    If pt needs NG tube replaced we will need to re-instate previous medication changes.   Clemetine Marker, MD

## 2019-11-23 NOTE — Progress Notes (Signed)
Patient ID: Thomas Dominguez, male   DOB: 1946/03/16, 74 y.o.   MRN: [id number] [facility] Surgery Progress Note:   * No surgery found *  Subjective: Mental status is clear.  Complaints none. Objective: Vital signs in last 24 hours: Temp:  [98 F (36.7 C)-98.8 F (37.1 C)] 98 F (36.7 C) (05/30 0434) Pulse Rate:  [66-72] 66 (05/30 0434) Resp:  [16-18] 17 (05/30 0434) BP: (118-133)/(59-82) 130/61 (05/30 0434) SpO2:  [98 %-100 %] 98 % (05/30 0434)  Intake/Output from previous day: 05/29 0701 - 05/30 0700 In: -  Out: 2550 [Urine:1550; Emesis/NG output:1000] Intake/Output this shift: No intake/output data recorded.  Physical Exam: Work of breathing is normal.  He is getting up and walking with NG clamped.  He had a BM yesterday and passed flatus during the night.  Abdomen is nontender  Lab Results:  Results for orders placed or performed during the hospital encounter of 11/20/19 (from the past 48 hour(s))  Comprehensive metabolic panel     Status: Abnormal   Collection Time: 11/22/19 11:55 AM  Result Value Ref Range   Sodium 140 135 - 145 mmol/L   Potassium 4.0 3.5 - 5.1 mmol/L   Chloride 106 98 - 111 mmol/L   CO2 29 22 - 32 mmol/L   Glucose, Bld 117 (H) 70 - 99 mg/dL    Comment: Glucose reference range applies only to samples taken after fasting for at least 8 hours.   BUN 7 (L) 8 - 23 mg/dL   Creatinine, Ser 0.90 0.61 - 1.24 mg/dL   Calcium 10.6 (H) 8.9 - 10.3 mg/dL   Total Protein 6.6 6.5 - 8.1 g/dL   Albumin 3.6 3.5 - 5.0 g/dL   AST 16 15 - 41 U/L   ALT 18 0 - 44 U/L   Alkaline Phosphatase 118 38 - 126 U/L   Total Bilirubin 0.6 0.3 - 1.2 mg/dL   GFR calc non Af Amer >60 >60 mL/min   GFR calc Af Amer >60 >60 mL/min   Anion gap 5 5 - 15    Comment: Performed at [facility], [place] [address], [place], [place] [id number]  Basic metabolic panel     Status: Abnormal   Collection Time: 11/23/19  3:18 AM  Result Value Ref Range   Sodium 142 135 - 145 mmol/L    Potassium 3.7 3.5 - 5.1 mmol/L   Chloride 108 98 - 111 mmol/L   CO2 28 22 - 32 mmol/L   Glucose, Bld 122 (H) 70 - 99 mg/dL    Comment: Glucose reference range applies only to samples taken after fasting for at least 8 hours.   BUN 8 8 - 23 mg/dL   Creatinine, Ser 0.90 0.61 - 1.24 mg/dL   Calcium 10.0 8.9 - 10.3 mg/dL   GFR calc non Af Amer >60 >60 mL/min   GFR calc Af Amer >60 >60 mL/min   Anion gap 6 5 - 15    Comment: Performed at [facility] [address], [place], [place] [address]  CBC with Differential/Platelet     Status: Abnormal   Collection Time: 11/23/19  3:18 AM  Result Value Ref Range   WBC 5.1 4.0 - 10.5 K/uL   RBC 4.20 (L) 4.22 - 5.81 MIL/uL   Hemoglobin 13.7 13.0 - 17.0 g/dL   HCT 41.3 39.0 - 52.0 %   MCV 98.3 80.0 - 100.0 fL   MCH 32.6 26.0 - 34.0 pg   MCHC  33.2 30.0 - 36.0 g/dL   RDW 12.5 11.5 - 15.5 %   Platelets 164 150 - 400 K/uL   nRBC 0.0 0.0 - 0.2 %   Neutrophils Relative % 58 %   Neutro Abs 3.0 1.7 - 7.7 K/uL   Lymphocytes Relative 24 %   Lymphs Abs 1.2 0.7 - 4.0 K/uL   Monocytes Relative 13 %   Monocytes Absolute 0.6 0.1 - 1.0 K/uL   Eosinophils Relative 4 %   Eosinophils Absolute 0.2 0.0 - 0.5 K/uL   Basophils Relative 1 %   Basophils Absolute 0.1 0.0 - 0.1 K/uL   Immature Granulocytes 0 %   Abs Immature Granulocytes 0.02 0.00 - 0.07 K/uL    Comment: Performed at [facility] [address], [place], [place] [address]    Radiology/Results: DG Abd 1 View  Result Date: 11/21/2019 CLINICAL DATA:  NG tube placement EXAM: ABDOMEN - 1 VIEW COMPARISON:  11/21/2019 FINDINGS: Enteric tube tip over the right upper quadrant presumably within gastric pull-through. Visible gas pattern at the abdomen is non obstructed. IMPRESSION: Enteric tube tip overlies the right upper quadrant presumably within patient's gastric pull-through Electronically Signed   By: Donavan Foil M.D.   On: 11/21/2019 19:58   DG Abd Portable 1V  Result Date:  11/21/2019 CLINICAL DATA:  Bowel obstruction tube placement EXAM: PORTABLE ABDOMEN - 1 VIEW COMPARISON:  11/21/2019, 11/20/2019, CT 11/20/2019, radiograph 11/20/2019, 09/02/2019 FINDINGS: Esophageal tube tip and side port overlie the right lower chest, likely within gastric pull-through. Possible small left pleural effusion. No dilated small bowel over the abdomen. Prior hernia repair. Hardware in the lumbosacral spine. Large hiatal hernia IMPRESSION: 1. Enteric tube tip and side-port project over the right lower chest, presumably within gastric pull-through 2. No dilated bowel over the abdomen 3. Large hiatal hernia Electronically Signed   By: Donavan Foil M.D.   On: 11/21/2019 19:57   DG Abd Portable 1V  Result Date: 11/21/2019 CLINICAL DATA:  74 year old male with small bowel obstruction. EXAM: PORTABLE ABDOMEN - 1 VIEW COMPARISON:  Earlier radiograph dated 11/21/2019. FINDINGS: No bowel dilatation or evidence of obstruction. Moderate stool throughout the colon. An enteric tube is not seen in the visualized abdomen. Ventral hernia repair and postsurgical changes of the lumbar spine. No acute osseous pathology. IMPRESSION: No bowel dilatation.  Nonvisualization of enteric tube. Electronically Signed   By: Anner Crete M.D.   On: 11/21/2019 19:41    Anti-infectives: Anti-infectives (From admission, onward)   None      Assessment/Plan: Problem List: Patient Active Problem List   Diagnosis Date Noted  . History of esophagectomy 90s at [facility] for severe Barrett's with microfocus of cancer 11/22/2019  . SBO (small bowel obstruction) ([place]) 11/20/2019  . Acute ataxia 09/30/2019  . 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  . Primary hyperparathyroidism ([place]) 09/30/2019  . Dyspnea on exertion 09/05/2019  . Hyperlipidemia 09/05/2019  . Leg swelling 01/17/2019  . Atrial fibrillation ([place]) 01/17/2019  . Acid reflux disease 01/17/2019  . H/O small bowel obstruction 01/17/2019  . Hiatal hernia  01/17/2019  . Depression, major, single episode, complete remission ([place]) 01/17/2019  . BPH (benign prostatic hyperplasia) 01/17/2019  . Degenerative disc disease, lumbar 01/17/2019  . Diverticulosis 01/17/2019    Could remove NG and start clear liquids.   * No surgery found *    LOS: 3 days   Matt B. Hassell Done, MD, [facility] [phone] to reach the surgeon on call.  11/23/2019 8:58 AM

## 2019-11-23 NOTE — Progress Notes (Signed)
Pt has had NGT clamped all afternoon.  Unclamped it after 4 hours and some clear liquids and had 150cc residual.  Will continue to monitor output.

## 2019-11-23 NOTE — Progress Notes (Signed)
Pt is doing well with clamping trial. After reconnecting his NGT to low suction, he had 250 cc of drainage for a total of 900 throughout the whole day.  I was going to DC it but after talking to the pt and knowing the difficulty with the insertion of the NGT, we decided to keep it clamped through the night and to DC in the am if he continues to do well with it clamped without nausea or vomiting.  He has been sipping on a clear liquid diet with good toleration. Pt agrees with this plan and we both feel this will be the best course for tonight.  Discussed this plan with the night nurse, [place].

## 2019-11-23 NOTE — Progress Notes (Signed)
Family Medicine Teaching Service Daily Progress Note Intern Pager: [phone]  Patient name: Thomas Dominguez Medical record number: [id number] Date of birth: 1946-05-26 Age: 74 y.o. Gender: male  Primary Care Provider: Benay Pike, MD Consultants: general surgery  Code Status: FULL  Pt Overview and Major Events to Date:  11/20/19:  Admitted, gen surg consulted   Assessment and Plan: Jori Moll L. [place] is a 74 y.o. male who presented with nausea, vomiting, and diffuse abdominal pain, found to have a small bowel obstruction. PMH is significant for recurrent SBO, A. 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   Recurrent SBO: Improving. Day 3 NGT in place. 1L output charted in last 24 hrs. First BM yesterday, 5/29.  -General surgery on board, appreciate recommendations -NGT with suction, will likely continue this through tomorrow, 6/1, pending continued improvement -N.p.o. -Protonix BID -Zofran as needed -Tylenol PRN  -Monitor for BM/flatulence  Paroxysmal atrial fibrillation: Chronic, stable. HR 60 to 70s.  Regular today. -Hold home propafenone and diltiazem while n.p.o, restart when possible -IV metoprolol 2.5-5 mg every 6 hours -Monitor HR/CM  Primary hyperparathyroidism  Right parathyroid adenoma: Chronic, stable. Ca stable at 10. -Monitor BMP -Management outpatient  Depression: Chronic, stable. -Hold home Effexor while n.p.o., restart when able   BPH: Chronic, stable. -Hold home Flomax while NPO  Low back pain 2/2 DDD: Chronic, stable. No complaints today.  Patient takes Robaxin 700 mg 3 times daily at home and tramadol -Continue IV Robaxin every 8 hours as needed for spasms -Hold home tramadol, can restart if needed -IV Tylenol as needed  GERD with history of Barrett's esophagus s/p esophagogastrectomy:  Stable. Concurrent hiatal hernia with colon and pancreas. -IV Protonix BID for now, will transition to po when able   Abnormal CT chest  Peribronchovascular infection: Chronic. No current SOB, satting appropriately on RA.  Follows with pulmonary outpatient. -Monitor respiratory symptoms -Repeat high-res CT chest on 6/4  FEN/GI: NPO, replete electrolytes as needed, D5 NS mIVF  PPx: Lenovox   Disposition: Likely home in the next several days.  Subjective:  No acute events overnight.  Doing well this morning, states he had a brown bowel movement yesterday which is his first since hospitalization.  No nausea or vomiting.  Has had some flatulence.  Has been getting up and walking around throughout the day.  Feels like his belly is almost to baseline.  Continues to have output from NG tube, currently has 100 mL of dark fluid.  Objective: Temp:  [97.8 F (36.6 C)-98.8 F (37.1 C)] 98.8 F (37.1 C) (05/29 2029) Pulse Rate:  [57-72] 72 (05/29 2029) Resp:  [16-18] 16 (05/29 2029) BP: (118-133)/(59-82) 133/82 (05/29 2029) SpO2:  [98 %-100 %] 100 % (05/29 2029)  Physical Exam: General: Alert, NAD HEENT: NCAT, NG tube in place with suction, 100 mL of dark brown fluid present. Cardiac: RRR no m/g/r Lungs: Clear bilaterally anteriorly, no increased WOB  Abdomen: Soft and nontender to palpation, does not appear distended, hypoactive bowel sounds throughout Msk: Moves all extremities spontaneously  Ext: Warm, dry, 2+ distal pulses, no edema   Laboratory: Recent Labs  Lab 11/20/19 0232 11/21/19 0225  WBC 10.4 6.2  HGB 15.5 14.0  HCT 46.6 42.1  PLT 211 186   Recent Labs  Lab 11/20/19 0232 11/20/19 0752 11/21/19 0225 11/22/19 1155  NA 138  --  143 140  K 4.5  --  3.8  4.0  CL 102  --  110 106  CO2 27  --  27 29  BUN 9  --  8 7*  CREATININE 0.96  --  0.80 0.90  CALCIUM 10.9*  --  9.8 10.6*  PROT  --  7.6 5.6* 6.6  BILITOT  --  0.7 0.5 0.6  ALKPHOS  --  145* 98 118  ALT  --   25 19 18   AST  --  29 17 16   GLUCOSE 121*  --  157* 117*      Imaging/Diagnostic Tests: No results found.  Patriciaann Clan, DO 11/23/2019, 12:10 AM PGY-2, [place] Intern pager: [phone], text pages welcome

## 2019-11-24 ENCOUNTER — Encounter: Payer: Self-pay | Admitting: Family Medicine

## 2019-11-24 LAB — BASIC METABOLIC PANEL
Anion gap: 8 (ref 5–15)
BUN: 10 mg/dL (ref 8–23)
CO2: 29 mmol/L (ref 22–32)
Calcium: 10.3 mg/dL (ref 8.9–10.3)
Chloride: 106 mmol/L (ref 98–111)
Creatinine, Ser: 0.83 mg/dL (ref 0.61–1.24)
GFR calc Af Amer: >60 mL/min (ref >60)
GFR calc non Af Amer: >60 mL/min (ref >60)
Glucose, Bld: 93 mg/dL (ref 70–99)
Potassium: 3.3 mmol/L — ABNORMAL LOW (ref 3.5–5.1)
Sodium: 143 mmol/L (ref 135–145)

## 2019-11-24 MED ORDER — METOPROLOL TARTRATE 25 MG PO TABS
12.5000 mg | ORAL_TABLET | Freq: Two times a day (BID) | ORAL | Status: DC
  Administered 2019-11-24: 12.5 mg via ORAL
  Filled 2019-11-24: qty 1

## 2019-11-24 MED ORDER — POTASSIUM CHLORIDE CRYS ER 20 MEQ PO TBCR
40.0000 meq | EXTENDED_RELEASE_TABLET | Freq: Once | ORAL | Status: AC
  Administered 2019-11-24: 40 meq via ORAL
  Filled 2019-11-24: qty 2

## 2019-11-24 NOTE — Discharge Summary (Signed)
[facility] Discharge Summary  Patient name: Thomas Dominguez. [place] Medical record number: [id number] Date of birth: [birth year] Age: 74 y.o. Gender: male Date of Admission: 11/20/2019  Date of Discharge: 11/24/19 Admitting Physician: Benay Pike, MD  Primary Care Provider: Benay Pike, MD Consultants: General Surgery   Indication for Hospitalization: Small bowel obstruction  Discharge Diagnoses/Problem List:  Small bowel obstruction Recurrent SBO A. 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   Disposition: Home   Discharge Condition: Medically stable for discharge   Discharge Exam:  General: Alert and cooperative and appears to be in no acute distress Cardio: Normal S1 and S2, RRR  Pulm: CTAB, normal WOB  Abdomen: Bowel sounds normal. Abdomen soft, non distended and non-tender.  Extremities: No peripheral edema. Warm/ well perfused.  Strong radial pulse Neuro: Cranial nerves grossly intact  Brief Hospital Course:   SBO Pt presented with 1 day history of diffuse abdominal pain and nausea and vomiting.  He has a history of recurrent small bowel obstruction with his last episode being 1.5 years ago. Labs: CMP except Ca 10.9 and CBC wnl. CT abdomen pelvis showed small bowel obstruction.  General surgery was consulted in the ED who recommended to treat nonoperatively as able.  Patient was made n.p.o. and started on NG tube with suction.  He also received IV Dilaudid and Zofran.  Since abdominal distention, pain and nausea improved throughout the hospital course.  He was weaned off Dilaudid to Tylenol.  On 5/31 patient's NG tube was removed.  He started a clear liquid diet and advanced to soft diet.  We discussed with general surgery recommended patient was medically stable for discharge and did not require follow-up for  his small bowel obstruction. He was discharged on 11/24/19.  Issues for Follow Up:  1. Follow up with [facility] for follow-up high resolution CT chest on 11/28/19.   Significant Procedures:    Significant Labs and Imaging:  Recent Labs  Lab 11/20/19 0232 11/21/19 0225 11/23/19 0318  WBC 10.4 6.2 5.1  HGB 15.5 14.0 13.7  HCT 46.6 42.1 41.3  PLT 211 186 164   Recent Labs  Lab 11/20/19 0232 11/20/19 0232 11/20/19 0752 11/21/19 0225 11/21/19 0225 11/22/19 1155 11/22/19 1155 11/23/19 0318 11/24/19 0159  NA 138  --   --  143  --  140  --  142 143  K 4.5   < >  --  3.8   < > 4.0   < > 3.7 3.3*  CL 102  --   --  110  --  106  --  108 106  CO2 27  --   --  27  --  29  --  28 29  GLUCOSE 121*  --   --  157*  --  117*  --  122* 93  BUN 9  --   --  8  --  7*  --  8 10  CREATININE 0.96  --   --  0.80  --  0.90  --  0.90 0.83  CALCIUM 10.9*  --   --  9.8  --  10.6*  --  10.0 10.3  ALKPHOS  --   --  145* 98  --  118  --   --   --   AST  --   --  29 17  --  16  --   --   --  ALT  --   --  25 19  --  18  --   --   --   ALBUMIN  --   --  4.3 3.0*  --  3.6  --   --   --    < > = values in this interval not displayed.      Results/Tests Pending at Time of Discharge:   Discharge Medications:  Allergies as of 11/24/2019      Reactions   Mobic [meloxicam] Other (See Comments)   SOB and muscle pain   Statins Other (See Comments)   Muscle pain      Medication List    TAKE these medications   aspirin EC 81 MG tablet Take 81 mg by mouth daily.   diltiazem 120 MG tablet Commonly known as: CARDIZEM Take 1 tablet (120 mg total) by mouth daily. Notes to patient: Next dose tomorrow morning at 10 am   methocarbamol 750 MG tablet Commonly known as: ROBAXIN Take 1 tablet (750 mg total) by mouth 2 (two) times daily as needed for muscle spasms.   omeprazole 40 MG capsule Commonly known as: PRILOSEC Take 1 capsule (40 mg total) by mouth 2 (two) times daily.    propafenone 225 MG tablet Commonly known as: RYTHMOL TAKE 1 TABLET (225 MG TOTAL) BY MOUTH EVERY 8 HOURS. Notes to patient: Due tonight at 10 pm   sucralfate 1 g tablet Commonly known as: Carafate Take 1 tablet (1 g total) by mouth 4 (four) times daily -  with meals and at bedtime.   tamsulosin 0.4 MG Caps capsule Commonly known as: FLOMAX TAKE 2 CAPSULES EVERY DAY What changed:   how much to take  how to take this  when to take this   traMADol 50 MG tablet Commonly known as: ULTRAM Take 50 mg by mouth 2 (two) times daily as needed.   venlafaxine XR 150 MG 24 hr capsule Commonly known as: EFFEXOR-XR TAKE 1 CAPSULE EVERY DAY WITH BREAKFAST What changed: See the new instructions. Notes to patient: Next dose tomorrow morning       Discharge Instructions: Please refer to Patient Instructions section of EMR for full details.  Patient was counseled important signs and symptoms that should prompt return to medical care, changes in medications, dietary instructions, activity restrictions, and follow up appointments.   Follow-Up Appointments: Follow-up Information    Benay Pike, MD. Go on 11/28/2019.   Specialty: Family Medicine Why: Go to appointment on Friday Nov 28, 2019. Arrive by 8:55 AM.   Contact information: [address] [place] [address] [phone]           Lattie Haw, MD 11/24/2019, 2:54 PM PGY-1, [place]

## 2019-11-24 NOTE — Progress Notes (Signed)
Discharged patient to home, AVS given and explained, concerns addressed to patient's satisfaction. Belongings returned accordingly.

## 2019-11-24 NOTE — Progress Notes (Signed)
Patient ID: Thomas Dominguez, male   DOB: 1946-02-16, 74 y.o.   MRN: [id number] [facility] Surgery Progress Note:   * No surgery found *  Subjective: Mental status is clear and talkative.  Complaints none-he is having no nausea with clamped NG. Objective: Vital signs in last 24 hours: Temp:  [97.6 F (36.4 C)-98.1 F (36.7 C)] 97.7 F (36.5 C) (05/31 0440) Pulse Rate:  [74-86] 74 (05/31 0440) Resp:  [17-18] 18 (05/31 0440) BP: (135-151)/(74-94) 138/74 (05/31 0440) SpO2:  [98 %-100 %] 100 % (05/31 0440)  Intake/Output from previous day: 05/30 0701 - 05/31 0700 In: [id number] [P.O.:1190; I.V.:602] Out: 1700 [Urine:800; Emesis/NG output:900] Intake/Output this shift: No intake/output data recorded.  Physical Exam: Work of breathing is normal.  Abdomen flat and no complaints of pain  Lab Results:  Results for orders placed or performed during the hospital encounter of 11/20/19 (from the past 48 hour(s))  Comprehensive metabolic panel     Status: Abnormal   Collection Time: 11/22/19 11:55 AM  Result Value Ref Range   Sodium 140 135 - 145 mmol/L   Potassium 4.0 3.5 - 5.1 mmol/L   Chloride 106 98 - 111 mmol/L   CO2 29 22 - 32 mmol/L   Glucose, Bld 117 (H) 70 - 99 mg/dL    Comment: Glucose reference range applies only to samples taken after fasting for at least 8 hours.   BUN 7 (L) 8 - 23 mg/dL   Creatinine, Ser 0.90 0.61 - 1.24 mg/dL   Calcium 10.6 (H) 8.9 - 10.3 mg/dL   Total Protein 6.6 6.5 - 8.1 g/dL   Albumin 3.6 3.5 - 5.0 g/dL   AST 16 15 - 41 U/L   ALT 18 0 - 44 U/L   Alkaline Phosphatase 118 38 - 126 U/L   Total Bilirubin 0.6 0.3 - 1.2 mg/dL   GFR calc non Af Amer >60 >60 mL/min   GFR calc Af Amer >60 >60 mL/min   Anion gap 5 5 - 15    Comment: Performed at [facility], [name] [address] [place], [place] [id number]  Basic metabolic panel     Status: Abnormal   Collection Time: 11/23/19  3:18 AM  Result Value Ref Range   Sodium 142 135 - 145 mmol/L   Potassium 3.7  3.5 - 5.1 mmol/L   Chloride 108 98 - 111 mmol/L   CO2 28 22 - 32 mmol/L   Glucose, Bld 122 (H) 70 - 99 mg/dL    Comment: Glucose reference range applies only to samples taken after fasting for at least 8 hours.   BUN 8 8 - 23 mg/dL   Creatinine, Ser 0.90 0.61 - 1.24 mg/dL   Calcium 10.0 8.9 - 10.3 mg/dL   GFR calc non Af Amer >60 >60 mL/min   GFR calc Af Amer >60 >60 mL/min   Anion gap 6 5 - 15    Comment: Performed at [facility] [address], [place], [place] [address]  CBC with Differential/Platelet     Status: Abnormal   Collection Time: 11/23/19  3:18 AM  Result Value Ref Range   WBC 5.1 4.0 - 10.5 K/uL   RBC 4.20 (L) 4.22 - 5.81 MIL/uL   Hemoglobin 13.7 13.0 - 17.0 g/dL   HCT 41.3 39.0 - 52.0 %   MCV 98.3 80.0 - 100.0 fL   MCH 32.6 26.0 - 34.0 pg   MCHC 33.2 30.0 - 36.0 g/dL   RDW  12.5 11.5 - 15.5 %   Platelets 164 150 - 400 K/uL   nRBC 0.0 0.0 - 0.2 %   Neutrophils Relative % 58 %   Neutro Abs 3.0 1.7 - 7.7 K/uL   Lymphocytes Relative 24 %   Lymphs Abs 1.2 0.7 - 4.0 K/uL   Monocytes Relative 13 %   Monocytes Absolute 0.6 0.1 - 1.0 K/uL   Eosinophils Relative 4 %   Eosinophils Absolute 0.2 0.0 - 0.5 K/uL   Basophils Relative 1 %   Basophils Absolute 0.1 0.0 - 0.1 K/uL   Immature Granulocytes 0 %   Abs Immature Granulocytes 0.02 0.00 - 0.07 K/uL    Comment: Performed at [facility] [address] [place], [place] [id number]  Basic metabolic panel     Status: Abnormal   Collection Time: 11/24/19  1:59 AM  Result Value Ref Range   Sodium 143 135 - 145 mmol/L   Potassium 3.3 (L) 3.5 - 5.1 mmol/L   Chloride 106 98 - 111 mmol/L   CO2 29 22 - 32 mmol/L   Glucose, Bld 93 70 - 99 mg/dL    Comment: Glucose reference range applies only to samples taken after fasting for at least 8 hours.   BUN 10 8 - 23 mg/dL   Creatinine, Ser 0.83 0.61 - 1.24 mg/dL   Calcium 10.3 8.9 - 10.3 mg/dL   GFR calc non Af Amer >60 >60 mL/min   GFR calc Af Amer >60 >60 mL/min    Anion gap 8 5 - 15    Comment: Performed at [facility] [address] [place], [place] [address]    Radiology/Results: No results found.  Anti-infectives: Anti-infectives (From admission, onward)   None      Assessment/Plan: Problem List: Patient Active Problem List   Diagnosis Date Noted  . History of esophagectomy 90s at [facility] for severe Barrett's with microfocus of cancer 11/22/2019  . SBO (small bowel obstruction) ([place]) 11/20/2019  . Acute ataxia 09/30/2019  . 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  . Primary hyperparathyroidism ([place]) 09/30/2019  . Dyspnea on exertion 09/05/2019  . Hyperlipidemia 09/05/2019  . Leg swelling 01/17/2019  . Atrial fibrillation ([place]) 01/17/2019  . Acid reflux disease 01/17/2019  . H/O small bowel obstruction 01/17/2019  . Hiatal hernia 01/17/2019  . Depression, major, single episode, complete remission ([place]) 01/17/2019  . BPH (benign prostatic hyperplasia) 01/17/2019  . Degenerative disc disease, lumbar 01/17/2019  . Diverticulosis 01/17/2019    OK to remove NG and advance diet.   * No surgery found *    LOS: 4 days   Matt B. Hassell Done, MD, [facility] [phone] to reach the surgeon on call.    11/24/2019 8:38 AM

## 2019-11-24 NOTE — Progress Notes (Addendum)
Family Medicine Teaching Service Daily Progress Note Intern Pager: [phone]  Patient name: Nicole Mcgovern. [facility]aker Medical record number: [id number] Date of birth: March 12, 1946 Age: 74 y.o. Gender: male  Primary Care Provider: Benay Pike, MD Consultants: general surgery  Code Status: FULL  Pt Overview and Major Events to Date:  11/20/19:  Admitted, gen surg consulted   Assessment and Plan: Jori Moll L. Murray is a 74 y.o. male who presented with nausea, vomiting, and diffuse abdominal pain, found to have a small bowel obstruction. PMH is significant for recurrent SBO, A. 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   Recurrent SBO: Improving. NG still in place but no suction (was going to be Denies abdominal pain, nausea or vomiting. Last BM was on 5/29. Continues to pass flatus. Tolerated clear liquid diet today and is happy to advance to soft diet today and possibly be discharged today. -General surgery on board, appreciate recommendations -Protonix BID -Zofran Q8PRN -Tylenol PRN  -Monitor for BM/flatulence  Hypokalemia K 3.3 today, K 3.7 on 5/30 -Replete with KCl 77meq x 1 -Monitor with BMP  Paroxysmal atrial fibrillation: Chronic, stable. HR 74 Home meds: propafenone and diltiazem  -Continue Diltiazem 120mg  daily -Monitor HR/CM  Primary hyperparathyroidism  Right parathyroid adenoma: Chronic, stable. Ca 10.3 today -Monitor BMP -Management outpatient  Depression: Chronic, stable. -Hold home Effexor while n.p.o., restart when able   BPH: Chronic, stable. -Hold home Flomax while NPO  Low back pain 2/2 DDD: Chronic, stable. Home meds: Robaxin 700 mg 3 times daily at home and tramadol -Continue Robaxin every 8 hours as needed -Hold home tramadol, can restart if needed  GERD with history of Barrett's esophagus s/p  esophagogastrectomy: Stable. Concurrent hiatal hernia with colon and pancreas. -Continue Protonix BID PO  Abnormal CT chest  Peribronchovascular infection: Chronic. Sats 100% on air. Denies SOB. Follows with pulmonary outpatient. -Monitor respiratory symptoms -Repeat high-res CT chest on 6/4  FEN/GI: soft diet  PPx: Lenovox   Disposition: Likely home in the next several days.  Subjective:  No acute events overnight. Pt is tolerating clear liquid diet and is happy to advance to soft diet today. Passing flatus, last BM was on 5/29. Has been ambulating and walked >4 miles yesterday in the hospital.  Objective: Temp:  [97.6 F (36.4 C)-98.1 F (36.7 C)] 97.7 F (36.5 C) (05/31 0440) Pulse Rate:  [74-86] 74 (05/31 0440) Resp:  [17-18] 18 (05/31 0440) BP: (135-151)/(74-94) 138/74 (05/31 0440) SpO2:  [98 %-100 %] 100 % (05/31 0440)  Physical Exam: General: Alert and cooperative and appears to be in no acute distress, NG in place without suction Cardio: Normal S1 and S2, RRR  Pulm: CTAB, normal WOB  Abdomen: Bowel sounds normal. Abdomen soft, non distended and non-tender.  Extremities: No peripheral edema. Warm/ well perfused.  Strong radial pulse Neuro: Cranial nerves grossly intact  Laboratory: Recent Labs  Lab 11/20/19 0232 11/21/19 0225 11/23/19 0318  WBC 10.4 6.2 5.1  HGB 15.5 14.0 13.7  HCT 46.6 42.1 41.3  PLT 211 186 164   Recent Labs  Lab 11/20/19 0232 11/20/19 0752 11/21/19 0225 11/21/19 0225 11/22/19 1155 11/23/19 0318 11/24/19 0159  NA   < >  --  143   < > 140 142 143  K   < >  --  3.8   < > 4.0 3.7 3.3*  CL   < >  --  110   < >  106 108 106  CO2   < >  --  27   < > 29 28 29   BUN   < >  --  8   < > 7* 8 10  CREATININE   < >  --  0.80   < > 0.90 0.90 0.83  CALCIUM   < >  --  9.8   < > 10.6* 10.0 10.3  PROT  --  7.6 5.6*  --  6.6  --   --   BILITOT  --  0.7 0.5  --  0.6  --   --   ALKPHOS  --  145* 98  --  118  --   --   ALT  --  25 19  --  18  --    --   AST  --  29 17  --  16  --   --   GLUCOSE   < >  --  157*   < > 117* 122* 93   < > = values in this interval not displayed.      Imaging/Diagnostic Tests: No results found.  Lattie Haw, MD 11/24/2019, 8:22 AM PGY-1, [place] Intern pager: [phone], text pages welcome

## 2019-11-24 NOTE — Progress Notes (Signed)
Called Dr Verita Lamb (General Surgery) and asked whether surgery would be okay for patient to be discharged.  He recommended the patient be discharged today as he is tolerating p.o. and having regular flatus. He does not require surgical follow-up.  Lattie Haw MD PGY1, [facility] Medicine

## 2019-11-24 NOTE — Plan of Care (Signed)
°  Problem: Clinical Measurements: °Goal: Ability to maintain clinical measurements within normal limits will improve °Outcome: Progressing °  °Problem: Activity: °Goal: Risk for activity intolerance will decrease °Outcome: Progressing °  °Problem: Nutrition: °Goal: Adequate nutrition will be maintained °Outcome: Progressing °  °

## 2019-11-24 NOTE — Progress Notes (Signed)
NG tube removed per order, patient tolerated well.

## 2019-11-25 ENCOUNTER — Other Ambulatory Visit: Payer: Self-pay

## 2019-11-25 ENCOUNTER — Encounter: Payer: Self-pay | Admitting: Family Medicine

## 2019-11-25 DIAGNOSIS — J189 Pneumonia, unspecified organism: Secondary | ICD-10-CM

## 2019-11-25 HISTORY — DX: Pneumonia, unspecified organism: J18.9

## 2019-11-25 NOTE — Patient Outreach (Signed)
[facility]) Care Management  11/25/2019  Jori Moll L. Plotkin [birth year] [id number]   Referral Date: 11/25/19 Referral Source: [employer] Report  Date of Discharge:11/24/19 Facility: [place]:  [facility]  Referral received.  No outreach warranted at this time.  Transition of Care calls being completed via EMMI. RN CM will outreach patient for any red flags received.    Plan: RN CM will close case.     Jone Baseman, RN, MSN [facility] Care Management Care Management Coordinator Direct Line [phone] Toll Free: [phone]  Fax: [phone]

## 2019-11-28 ENCOUNTER — Ambulatory Visit (INDEPENDENT_AMBULATORY_CARE_PROVIDER_SITE_OTHER)
Admission: RE | Admit: 2019-11-28 | Discharge: 2019-11-28 | Disposition: A | Discharge status: Home / Self Care | Payer: Medicare HMO | Source: Ambulatory Visit | Attending: Internal Medicine | Admitting: Internal Medicine

## 2019-11-28 ENCOUNTER — Ambulatory Visit (INDEPENDENT_AMBULATORY_CARE_PROVIDER_SITE_OTHER): Payer: Medicare HMO | Admitting: Family Medicine

## 2019-11-28 ENCOUNTER — Encounter: Payer: Self-pay | Admitting: Family Medicine

## 2019-11-28 ENCOUNTER — Other Ambulatory Visit: Payer: Self-pay

## 2019-11-28 VITALS — BP 110/60 | HR 83 | Wt 198.6 lb

## 2019-11-28 DIAGNOSIS — K56609 Unspecified intestinal obstruction, unspecified as to partial versus complete obstruction: Secondary | ICD-10-CM | POA: Diagnosis not present

## 2019-11-28 DIAGNOSIS — E876 Hypokalemia: Secondary | ICD-10-CM | POA: Diagnosis not present

## 2019-11-28 DIAGNOSIS — M7989 Other specified soft tissue disorders: Secondary | ICD-10-CM

## 2019-11-28 DIAGNOSIS — J181 Lobar pneumonia, unspecified organism: Secondary | ICD-10-CM | POA: Diagnosis not present

## 2019-11-28 DIAGNOSIS — R0609 Other forms of dyspnea: Secondary | ICD-10-CM

## 2019-11-28 DIAGNOSIS — R0600 Dyspnea, unspecified: Secondary | ICD-10-CM | POA: Diagnosis not present

## 2019-11-28 DIAGNOSIS — J90 Pleural effusion, not elsewhere classified: Secondary | ICD-10-CM | POA: Diagnosis not present

## 2019-11-28 DIAGNOSIS — J189 Pneumonia, unspecified organism: Secondary | ICD-10-CM | POA: Diagnosis not present

## 2019-11-28 DIAGNOSIS — E210 Primary hyperparathyroidism: Secondary | ICD-10-CM

## 2019-11-28 NOTE — Assessment & Plan Note (Signed)
Patient states that his dyspnea on exertion resolved after taking the antibiotic.  He is scheduled to get a lung CT today and will then discuss the results with his pulmonologist.  He will schedule an appointment with me after this issue and his parathyroid issue are resolved.

## 2019-11-28 NOTE — Progress Notes (Signed)
    SUBJECTIVE:   CHIEF COMPLAINT / HPI:   GI: Patient still having some nausea, but this is normal for him.  Not having any vomiting.  Having normal bowel movements.  No abdominal pain.  Pulm: Patient is going to get a CT of his lung today to evaluate for changes from his last CT scan.  Leg swelling: Patient had left leg swelling 2 days before his admission to the hospital, but this resolved.  He has had this issue in the past but it had previously resolved after the switch to tamsulosin twice a day.  He was wondering if it was associated with the small bowel obstruction given the timing of the 2 events.  Hypothyroidism: Patient has not heard from the general surgeon regarding his most recent lab and imaging results.  He does not have any appointment scheduled in the future with them or the endocrinologist.  PERTINENT  PMH / [place]: A. fib, hyperparathyroidism, SBO  OBJECTIVE:   BP 110/60   Pulse 83   Wt 198 lb 9.6 oz (90.1 kg)   SpO2 98%   BMI 26.20 kg/m   General: Alert and oriented.  No acute distress.  Appears stated age. CV: Regular rate and rhythm.  No murmurs. Pulmonary: Lungs clear to auscultation bilaterally.  No wheezes or crackles. Abdomen: Mildly distended, but soft.  Bowel sounds present.  No abdominal tenderness. Extremities: Trace pitting edema of the shins bilaterally.  Legs appear symmetrical.  ASSESSMENT/PLAN:   SBO (small bowel obstruction) ([place]) Doing well, minimal nausea.  No vomiting.  Keeping p.o. intake down.  No surgical follow-up recommended.  Continue to monitor.  Leg swelling Patient complained of left leg swelling for 2 to 3 days prior to the admission, but resolved during his hospital stay.  Both legs appear symmetrical with trace pitting edema.  Most likely venous incompetence.  Advised patient to wear compression stockings if desired and to keep feet elevated when not walking  Primary hyperparathyroidism [facility]) Patient has not heard from his general  surgeon regarding the imaging results, but he looked at them in my chart and says it showed a adenoma in the right lower parathyroid gland.  I advised the patient to call his endocrinologist and general surgeon to see if they would like to schedule an appointment or discuss his results with him.  Most likely next step is surgical removal.  Dyspnea on exertion Patient states that his dyspnea on exertion resolved after taking the antibiotic.  He is scheduled to get a lung CT today and will then discuss the results with his pulmonologist.  He will schedule an appointment with me after this issue and his parathyroid issue are resolved.     Benay Pike, MD [place]

## 2019-11-28 NOTE — Assessment & Plan Note (Signed)
Patient has not heard from his general surgeon regarding the imaging results, but he looked at them in my chart and says it showed a adenoma in the right lower parathyroid gland.  I advised the patient to call his endocrinologist and general surgeon to see if they would like to schedule an appointment or discuss his results with him.  Most likely next step is surgical removal.

## 2019-11-28 NOTE — Patient Instructions (Signed)
It was nice to see you again today,  I am glad that you are feeling better.  There are no changes we need to make to your any of your medications.  I would like to get a BMP to check your potassium make sure has gone back up.  After you have had your surgery and have completed your work-up of your lung issues with your pulmonologist, I would like you to schedule another appointment with me so we can review what changes have been made and if we need to do any additional work-up.  Have a great day,  Clemetine Marker, MD

## 2019-11-28 NOTE — Assessment & Plan Note (Addendum)
Patient complained of left leg swelling for 2 to 3 days prior to the admission, but resolved during his hospital stay.  Both legs appear symmetrical with trace pitting edema.  Most likely venous incompetence.  Advised patient to wear compression stockings if desired and to keep feet elevated when not walking

## 2019-11-28 NOTE — Assessment & Plan Note (Signed)
Doing well, minimal nausea.  No vomiting.  Keeping p.o. intake down.  No surgical follow-up recommended.  Continue to monitor.

## 2019-11-29 LAB — BASIC METABOLIC PANEL
BUN/Creatinine Ratio: 9 — ABNORMAL LOW (ref 10–24)
BUN: 8 mg/dL (ref 8–27)
CO2: 24 mmol/L (ref 20–29)
Calcium: 10.2 mg/dL (ref 8.6–10.2)
Chloride: 104 mmol/L (ref 96–106)
Creatinine, Ser: 0.85 mg/dL (ref 0.76–1.27)
GFR calc Af Amer: 100 mL/min/{1.73_m2} (ref >59)
GFR calc non Af Amer: 86 mL/min/{1.73_m2} (ref >59)
Glucose: 111 mg/dL — ABNORMAL HIGH (ref 65–99)
Potassium: 4.8 mmol/L (ref 3.5–5.2)
Sodium: 141 mmol/L (ref 134–144)

## 2019-12-09 ENCOUNTER — Other Ambulatory Visit (HOSPITAL_COMMUNITY)
Admission: RE | Admit: 2019-12-09 | Discharge: 2019-12-09 | Disposition: A | Discharge status: Home / Self Care | Payer: Medicare HMO | Source: Ambulatory Visit | Attending: Adult Health | Admitting: Adult Health

## 2019-12-09 ENCOUNTER — Ambulatory Visit: Payer: Medicare HMO | Admitting: Cardiology

## 2019-12-09 DIAGNOSIS — Z20822 Contact with and (suspected) exposure to covid-19: Secondary | ICD-10-CM | POA: Insufficient documentation

## 2019-12-09 DIAGNOSIS — Z01812 Encounter for preprocedural laboratory examination: Secondary | ICD-10-CM | POA: Diagnosis not present

## 2019-12-09 LAB — SARS CORONAVIRUS 2 (TAT 6-24 HRS): SARS Coronavirus 2: NEGATIVE

## 2019-12-10 ENCOUNTER — Ambulatory Visit: Payer: Medicare HMO | Admitting: Cardiology

## 2019-12-12 ENCOUNTER — Other Ambulatory Visit: Payer: Self-pay

## 2019-12-12 ENCOUNTER — Ambulatory Visit: Payer: Medicare HMO | Admitting: Adult Health

## 2019-12-12 ENCOUNTER — Ambulatory Visit (INDEPENDENT_AMBULATORY_CARE_PROVIDER_SITE_OTHER): Payer: Medicare HMO | Admitting: Internal Medicine

## 2019-12-12 DIAGNOSIS — R0600 Dyspnea, unspecified: Secondary | ICD-10-CM

## 2019-12-12 DIAGNOSIS — R0609 Other forms of dyspnea: Secondary | ICD-10-CM

## 2019-12-12 LAB — PULMONARY FUNCTION TEST
DL/VA % pred: 98 %
DL/VA: 3.89 ml/min/mmHg/L
DLCO cor % pred: 76 %
DLCO cor: 20.59 ml/min/mmHg
DLCO unc % pred: 74 %
DLCO unc: 20.05 ml/min/mmHg
FEF 25-75 Post: 2.77 L/sec
FEF 25-75 Pre: 1.58 L/sec
FEF2575-%Change-Post: 75 %
FEF2575-%Pred-Post: 110 %
FEF2575-%Pred-Pre: 62 %
FEV1-%Change-Post: 15 %
FEV1-%Pred-Post: 76 %
FEV1-%Pred-Pre: 66 %
FEV1-Post: 2.6 L
FEV1-Pre: 2.25 L
FEV1FVC-%Change-Post: 5 %
FEV1FVC-%Pred-Pre: 100 %
FEV6-%Change-Post: 9 %
FEV6-%Pred-Post: 75 %
FEV6-%Pred-Pre: 69 %
FEV6-Post: 3.34 L
FEV6-Pre: 3.04 L
FEV6FVC-%Change-Post: 0 %
FEV6FVC-%Pred-Post: 105 %
FEV6FVC-%Pred-Pre: 105 %
FVC-%Change-Post: 9 %
FVC-%Pred-Post: 71 %
FVC-%Pred-Pre: 65 %
FVC-Post: 3.35 L
FVC-Pre: 3.06 L
Post FEV1/FVC ratio: 78 %
Post FEV6/FVC ratio: 100 %
Pre FEV1/FVC ratio: 74 %
Pre FEV6/FVC Ratio: 100 %
RV % pred: 102 %
RV: 2.7 L
TLC % pred: 81 %
TLC: 6.04 L

## 2019-12-12 NOTE — Progress Notes (Signed)
PFT done today. 

## 2019-12-15 ENCOUNTER — Ambulatory Visit: Payer: Self-pay | Admitting: Surgery

## 2019-12-15 ENCOUNTER — Encounter: Payer: Self-pay | Admitting: Pulmonary Disease

## 2019-12-15 ENCOUNTER — Other Ambulatory Visit: Payer: Self-pay

## 2019-12-15 ENCOUNTER — Ambulatory Visit (INDEPENDENT_AMBULATORY_CARE_PROVIDER_SITE_OTHER): Payer: Medicare HMO | Admitting: Pulmonary Disease

## 2019-12-15 DIAGNOSIS — R0609 Other forms of dyspnea: Secondary | ICD-10-CM

## 2019-12-15 DIAGNOSIS — R0600 Dyspnea, unspecified: Secondary | ICD-10-CM | POA: Diagnosis not present

## 2019-12-15 DIAGNOSIS — I4891 Unspecified atrial fibrillation: Secondary | ICD-10-CM | POA: Diagnosis not present

## 2019-12-15 NOTE — Patient Instructions (Addendum)
You were seen today by Lauraine Rinne, NP  for:   Pleasure talking with you today.  We reviewed your pulmonary function test as well as your repeat high-resolution CT chest.  High-resolution CT chest favors scarring from previous infection.  I am glad that you are feeling better after antibiotics.  If your shortness of breath starts to worsen again please let our office know.  We have also reviewed your breathing test.  Overall I find the breathing test stable especially given your clinical improvement over the last few months.  We will plan on seeing you back in about 6 months sooner if you need it.  Take care and stay safe,  Athanasios Heldman  1. Dyspnea on exertion   Follow Up:    Return in about 6 months (around 06/15/2020), or if symptoms worsen or fail to improve, for Follow up with Dr. Shearon Stalls.   Please do your part to reduce the spread of COVID-19:      Reduce your risk of any infection  and COVID19 by using the similar precautions used for avoiding the common cold or flu:  Marland Kitchen Wash your hands often with soap and warm water for at least 20 seconds.  If soap and water are not readily available, use an alcohol-based hand sanitizer with at least 60% alcohol.  . If coughing or sneezing, cover your mouth and nose by coughing or sneezing into the elbow areas of your shirt or coat, into a tissue or into your sleeve (not your hands). Langley Gauss A MASK when in public  . Avoid shaking hands with others and consider head nods or verbal greetings only. . Avoid touching your eyes, nose, or mouth with unwashed hands.  . Avoid close contact with people who are sick. . Avoid places or events with large numbers of people in one location, like concerts or sporting events. . If you have some symptoms but not all symptoms, continue to monitor at home and seek medical attention if your symptoms worsen. . If you are having a medical emergency, call 911.   [place] / e-Visit: [email or web address]         [facility]: [facility]: [phone]                   [facility]: [phone]     It is flu season:   >>> Best ways to protect herself from the flu: Receive the yearly flu vaccine, practice good hand hygiene washing with soap and also using hand sanitizer when available, eat a nutritious meals, get adequate rest, hydrate appropriately   Please contact the office if your symptoms worsen or you have concerns that you are not improving.   Thank you for choosing [facility] for your healthcare, and for allowing [place] to partner with you on your healthcare journey. I am thankful to be able to provide care to you today.   [name] FNP-C

## 2019-12-15 NOTE — Assessment & Plan Note (Signed)
Plan: Keep follow-up with primary care and cardiology 

## 2019-12-15 NOTE — Progress Notes (Signed)
Virtual Visit via Telephone Note  I connected with Thomas Dominguez on 12/15/19 at  9:00 AM EDT by telephone and verified that I am speaking with the correct person using two identifiers.  Location: Patient: Home Provider: Office [facility] - [address], [place], [place], [place] [address]   I discussed the limitations, risks, security and privacy concerns of performing an evaluation and management service by telephone and the availability of in person appointments. I also discussed with the patient that there may be a patient responsible charge related to this service. The patient expressed understanding and agreed to proceed.  Patient consented to consult via telephone: Yes People present and their role in pt care: Pt    History of Present Illness:  74 year old male never smoker initially referred to our office on 09/11/2019 for evaluation of dyspnea on exertion  PMH: A. fib, history of back surgeries, depression, BPH, history of esophagectomy for severe Barrett's Smoking History: Never Smoker  Maintenance: none Dr. Shearon Stalls   Chief complaint:    74 year old male never smoker initially referred to our office in March/2021 for evaluation of dyspnea on exertion.  Previously seen by pulmonologist 5 years ago due to groundglass opacities in lungs.  History of hiatal hernia.  Recurrent ventral hernia.  Recurrent aspiration.  Established with Dr. Shearon Stalls in March/2021 plan after that visit was to obtain pulmonary function testing and a CT of chest to further evaluate dextrocardia.  Chart review shows normal echocardiogram in October/2020.  Patient completing televisit with our office today.  He reports that he is feeling that his shortness of breath has improved since being treated with antibiotics after March/2021 high-resolution CT chest.  Patient feels that he is able to walk more and taking a larger deep breath.  We will review the repeat high-resolution CT chest today as well as  the pulmonary function testing.  He is followed by [facility] gastroenterology Dr. Silverio Decamp.  He feels that his GI symptoms are better if he sleeping of the bed they can raise the head instead of using his wedge anymore.     Observations/Objective:  09/22/19 - CT Chest High Res -unusual findings in the lungs in the peribronchial vascular distribution bilaterally, strongly favored to be infectious or inflammatory etiology, clinical evaluation is recommended and followed by repeat high-resolution CT chest in 3 months after potential trial of antibiotic therapy, aortic arthrosclerosis, status post esophagectomy with gastric pull-through, large hiatal hernia  11/30/2019-CT chest high-res-extensive irregular consolidation and subtly nodular airspace opacities scattered throughout the lungs most conspicuously in the lingula and right middle lobe are unchanged compared to prior examination on March/2021, findings are most consistent with sequela of prior inflammation, no findings specific for fibrotic interstitial lung disease, trace right pleural effusion, hiatal hernia  12/12/2019-pulmonary function test-FVC 3.06 (65% predicted), postbronchodilator ratio 78, postbronchodilator FEV1 2.6 (76% predicted), positive bronchodilator response with FEV1, mid flow reversibility, DLCO 20.05 (74% predicted),  Social History   Tobacco Use  Smoking Status Never Smoker  Smokeless Tobacco Never Used   Immunization History  Administered Date(s) Administered  . Fluad Quad(high Dose 65+) 04/23/2019  . Influenza Inj Mdck Quad Pf 07/04/2017, 06/11/2018  . Influenza, Quadrivalent, Recombinant, Inj, Pf 04/21/2019  . Influenza-Unspecified 04/28/2019    Assessment and Plan:  Dyspnea on exertion DOE has improved  PFTs reviewed with patient   Repeat high-resolution CT chest reviewed with patient Pattern of restriction seen in full vital capacity but normal lung volumes Patient reporting clinical improvement after antibiotics  History of back surgeries Normal echocardiogram in October/2020  Plan: We will continue clinically monitor 72-month follow-up with Dr. Shearon Stalls   History of esophagectomy [facility] at [facility] for severe Barrett's with microfocus of cancer Plan: Keep follow-up with [facility]  Atrial fibrillation [facility]) Plan: Keep follow-up with primary care and cardiology   Follow Up Instructions:  Return in about 6 months (around 06/15/2020), or if symptoms worsen or fail to improve, for Follow up with Dr. Shearon Stalls.   I discussed the assessment and treatment plan with the patient. The patient was provided an opportunity to ask questions and all were answered. The patient agreed with the plan and demonstrated an understanding of the instructions.   The patient was advised to call back or seek an in-person evaluation if the symptoms worsen or if the condition fails to improve as anticipated.  I provided 25 minutes of non-face-to-face time during this encounter.   Lauraine Rinne, NP

## 2019-12-15 NOTE — Assessment & Plan Note (Addendum)
DOE has improved  PFTs reviewed with patient   Repeat high-resolution CT chest reviewed with patient Pattern of restriction seen in full vital capacity but normal lung volumes Patient reporting clinical improvement after antibiotics History of back surgeries Normal echocardiogram in October/2020  Plan: We will continue clinically monitor 8-month follow-up with Dr. Shearon Stalls

## 2019-12-15 NOTE — Assessment & Plan Note (Signed)
Plan: Keep follow-up with [facility]

## 2019-12-16 MED ORDER — BUDESONIDE-FORMOTEROL FUMARATE 80-4.5 MCG/ACT IN AERO: 2.0000 | INHALATION_SPRAY | Freq: Two times a day (BID) | RESPIRATORY_TRACT | 5 refills | Status: DC

## 2019-12-16 NOTE — Telephone Encounter (Signed)
12/16/2019  You had mild improvement in your FEV1 which is the amount of air that you can blow out in the first second.  You also had improvement in what we call your mid flows which is the smaller airways of your lungs.  If you are interested in a inhaler trial I do not believe that is unreasonable.  We held off on doing this yesterday because you had already noticed clinical improvement since last being evaluated.  In the exercise that you are outlining you would need a longer acting inhaler than simply albuterol.  Albuterol work of her 15 to 30-minute period.  Which obviously will not last if you are walking 4 miles.  If you would like to do a trial of an inhaler like Symbicort 80:   Symbicort 80 >>> 2 puffs in the morning right when you wake up, rinse out your mouth after use, 12 hours later 2 puffs, rinse after use >>> Take this daily, no matter what >>> This is not a rescue inhaler   We could call in a 1 month supply and you could see if you notice clinical improvement with this.  In some situations is also clinically appropriate to use Symbicort 80 as needed.  For right now on the trial I would recommend using it as prescribed and we can always decrease use if you notice clinical improvement.  Hopefully this is helpful.  [name], FNP

## 2019-12-16 NOTE — Telephone Encounter (Signed)
Thomas Dominguez, please advise on pt email, thanks  [place], Thomas Dominguez" sent to [name],  You mentioned and I read it in your visit notes, in my PFT there was improvement after the use of a bronchodilator.  I forgot to ask how much improvement. In testing I couldn't tell if I had improved or not.  I don't know if I'm reading the results correctly in that the fvc improved from 65 to 78? Or what percentage amount.     So my question today is,  In my day to day activities to include wanting to hike a couple of times a month (liking to achieve 4 or more miles of moderate terrain) would I benefit from using a bronchodilator?  Thank you,  Thomas Dominguez

## 2019-12-17 ENCOUNTER — Encounter: Payer: Self-pay | Admitting: Family Medicine

## 2019-12-17 ENCOUNTER — Other Ambulatory Visit: Payer: Self-pay | Admitting: Family Medicine

## 2019-12-23 ENCOUNTER — Encounter: Payer: Self-pay | Admitting: Family Medicine

## 2019-12-23 NOTE — Telephone Encounter (Signed)
Great Feedback!   Hello, I thought I'd give feed back on Symbicort Rx since my follow up appt. Isn't until September. I've been on the symbicort Rx for a week. I noticed improvement right away. I can breathe deeper and the 3rd day took a hilly 2 mile walk, summer weather with no breathing issues. Breathing also seems to be more relaxed. I also seem to be coughing less. So thank you for your help to get this far and help my health to be better. Thomas Dominguez.

## 2019-12-23 NOTE — Telephone Encounter (Signed)
12/23/2019  That is great news.  Happy to hear this.  Can we make sure the patient has enough refills to last till the next follow-up visit.  Please contact us if you have any additional questions or concerns.  [name], FNP

## 2019-12-25 NOTE — Progress Notes (Signed)
Cardiology Office Note:    Date:  12/26/2019   ID:  Thomas Dominguez, Thomas Dominguez [birth year], MRN [id number]  PCP:  Thomas Pike, MD  Cardiologist:  No primary care provider on file.  Electrophysiologist:  None   Referring MD: Thomas Pike, MD   Chief complaint: AF  History of Present Illness:    Thomas Dominguez is a 74 y.o. male with a hx of atrial fibrillation, esophageal cancer status post esophagectomy, BPH, depression, degenerative disc disease who presents for follow-up.  He was referred by Dr. Owens Dominguez for evaluation of atrial fibrillation, initially seen on 03/18/2019.  He reports that he was diagnosed with atrial fibrillation in 2013.  He was previously seeing a cardiologist in [place], [place].  He had been alternating stress test and echo every other year.  Has been on propafenone and diltiazem since 2013.  States that he always feels it when he goes into AF.  Will occur every 3-4 days, can last up to 1 hour.  Feels short of breath when in AF.  Had back surgery in June 2020, has been improving.  States that walks 1.5 miles per day.  Has been having dyspnea with exertion but able to complete his walks.  He reports that he checks his BP at home, has always been normal.   TTE 04/02/2019 showed EF 55 to 10%, grade 2 diastolic dysfunction, normal RV function, no significant valvular disease.  14-day Zio patch showed no recurrent atrial fibrillation but did show 20 episodes of SVT, longest lasting 11 seconds.  Triggered events corresponded to sinus rhythm and rare PVCs.  Since last clinic visit, he was admitted to [facility] from 5/27 through 11/24/2019 with small bowel obstruction.  He has had a history of recurrent small bowel obstruction.  His symptoms improved with conservative management.  Reports he was having dyspnea that was thought to be related to recent episode of pneumonia.  States that dyspnea has resolved with treatment of pneumonia.  He denies any chest pain.  States that for  exercise he walks his dog 1.5 to 2 miles per day and denies any exertional symptoms.  He denies any palpitations.  He was started on atorvastatin but unable to tolerate due to myalgias.   Past Medical History:  Diagnosis Date  . Acid reflux disease 01/17/2019  . Atrial fibrillation ([place]) 01/17/2019  . BPH (benign prostatic hyperplasia) 01/17/2019  . Degenerative disc disease, lumbar 01/17/2019  . Depression, major, single episode, complete remission ([place]) 01/17/2019  . Diverticulosis 01/17/2019  . DVT (deep venous thrombosis) ([place]) 01/17/2019  . Esophageal cancer ([place])   . GERD (gastroesophageal reflux disease)   . H/O small bowel obstruction 01/17/2019  . Hiatal hernia 01/17/2019  . SBO (small bowel obstruction) ([place]) 11/20/2019    Past Surgical History:  Procedure Laterality Date  . ABDOMINAL HERNIA REPAIR  2012, 2014, 2017   ventral  . COLONOSCOPY  2019  . ESOPHAGECTOMY  1997   for esophageal cancer  . SPINAL FUSION  2018, 2020   L2 L5 in 2018, L5-S1 2020    Current Medications: Current Meds  Medication Sig  . aspirin EC 81 MG tablet Take 81 mg by mouth daily.  . budesonide-formoterol (SYMBICORT) 80-4.5 MCG/ACT inhaler Inhale 2 puffs into the lungs 2 (two) times daily.  Marland Kitchen diltiazem (CARDIZEM) 120 MG tablet TAKE 1 TABLET (120 MG TOTAL) BY MOUTH DAILY.  . methocarbamol (ROBAXIN) 750 MG tablet Take 1 tablet (750 mg total) by mouth 2 (two) times daily  as needed for muscle spasms.  Marland Kitchen omeprazole (PRILOSEC) 40 MG capsule Take 1 capsule (40 mg total) by mouth 2 (two) times daily.  . propafenone (RYTHMOL) 225 MG tablet TAKE 1 TABLET (225 MG TOTAL) BY MOUTH EVERY 8 HOURS.  . tamsulosin (FLOMAX) 0.4 MG CAPS capsule TAKE 2 CAPSULES EVERY DAY (Patient taking differently: 0.4 mg in the morning and at bedtime. )  . traMADol (ULTRAM) 50 MG tablet Take 50 mg by mouth 2 (two) times daily as needed.  . venlafaxine XR (EFFEXOR-XR) 150 MG 24 hr capsule TAKE 1 CAPSULE EVERY DAY WITH BREAKFAST (Patient  taking differently: Take 150 mg by mouth daily with breakfast. )     Allergies:   Mobic [meloxicam] and Statins   Social History   Socioeconomic History  . Marital status: Married    Spouse name: Thomas Dominguez  . Number of children: 2  . Years of education: Not on file  . Highest education level: Not on file  Occupational History  . Occupation: retired    Fish farm manager: [employer]  Tobacco Use  . Smoking status: Never Smoker  . Smokeless tobacco: Never Used  Vaping Use  . Vaping Use: Never used  Substance and Sexual Activity  . Alcohol use: Yes    Alcohol/week: 7.0 standard drinks    Types: 7 Cans of beer per week  . Drug use: Never  . Sexual activity: Not on file  Other Topics Concern  . Not on file  Social History Narrative  . Not on file   Social Determinants of Health   Financial Resource Strain:   . Difficulty of Paying Living Expenses:   Food Insecurity:   . Worried About Charity fundraiser in the Last Year:   . Arboriculturist in the Last Year:   Transportation Needs:   . Film/video editor (Medical):   Marland Kitchen Lack of Transportation (Non-Medical):   Physical Activity:   . Days of Exercise per Week:   . Minutes of Exercise per Session:   Stress:   . Feeling of Stress :   Social Connections:   . Frequency of Communication with Friends and Family:   . Frequency of Social Gatherings with Friends and Family:   . Attends Religious Services:   . Active Member of Clubs or Organizations:   . Attends Archivist Meetings:   Marland Kitchen Marital Status:      Family History: The patient's family history includes Emphysema in his father.  ROS:   Please see the history of present illness.     All other systems reviewed and are negative.  EKGs/Labs/Other Studies Reviewed:    The following studies were reviewed today:   EKG:  EKG is ordered today.  The ekg ordered 12/26/19 demonstrates NSR, rate 70, no ST/T changes  Cardiac monitor 04/22/19:  No atrial fibrillation  seen. 20 episodes of SVT, longest lasting 11 seconds.   13 days of data recorded on Zio monitor. Patient had a min HR of 59 bpm, max HR of 174 bpm, and avg HR of 87 bpm. Predominant underlying rhythm was Sinus Rhythm. No VT, atrial fibrillation, high degree block, or pauses noted. Isolated atrial and ventricular ectopy was rare (<1%). 20 episodes of of SVT, longest lasting 11 seconds (rate 144 bpm); fastest was rate 174 bpm, lasting 12 beats.There were 8 triggered events, which corresponded to sinus rhythm and rare PVCs. No significant arrhythmias detected.  TTE 04/02/19:  1. Left ventricular ejection fraction, by visual estimation, is  55 to 60%. The left ventricle has normal function. There is no left ventricular hypertrophy.  2. Left ventricular diastolic Doppler parameters are consistent with pseudonormalization pattern of LV diastolic filling.  3. Global right ventricle has normal systolic function.The right ventricular size is mildly enlarged. No increase in right ventricular wall thickness.  4. Left atrial size was normal.  5. Right atrial size was normal.  6. The mitral valve is normal in structure. No evidence of mitral valve regurgitation. No evidence of mitral stenosis.  7. The tricuspid valve is normal in structure. Tricuspid valve regurgitation is trivial.  8. The aortic valve is normal in structure. Aortic valve regurgitation was not visualized by color flow Doppler.  9. The pulmonic valve was normal in structure. Pulmonic valve regurgitation is not visualized by color flow Doppler. 10. Normal pulmonary artery systolic pressure. 11. The atrial septum is grossly normal.  Recent Labs: 06/10/2019: BNP 20.7 11/22/2019: ALT 18 11/23/2019: Hemoglobin 13.7; Platelets 164 11/28/2019: BUN 8; Creatinine, Ser 0.85; Potassium 4.8; Sodium 141  Recent Lipid Panel    Component Value Date/Time   CHOL 212 (H) 06/10/2019 1123   TRIG 252 (H) 06/10/2019 1123   HDL 69 06/10/2019 1123   CHOLHDL 3.1  06/10/2019 1123   LDLCALC 101 (H) 06/10/2019 1123    Physical Exam:    VS:  BP 102/60   Pulse 70   Ht 6\' 1"  (1.854 m)   Wt 199 lb 6.4 oz (90.4 kg)   BMI 26.31 kg/m     Wt Readings from Last 3 Encounters:  12/26/19 199 lb 6.4 oz (90.4 kg)  11/28/19 198 lb 9.6 oz (90.1 kg)  11/20/19 202 lb (91.6 kg)     GEN:  Well nourished, well developed in no acute distress HEENT: Normal NECK: No JVD; No carotid bruits LYMPHATICS: No lymphadenopathy CARDIAC: RRR, no murmurs, rubs, gallops RESPIRATORY:  Clear to auscultation without rales, wheezing or rhonchi  ABDOMEN: Soft, non-tender, non-distended MUSCULOSKELETAL: no lower extremity edema; No deformity  SKIN: Warm and dry NEUROLOGIC:  Alert and oriented x 3 PSYCHIATRIC:  Normal affect   ASSESSMENT:    1. PAF (paroxysmal atrial fibrillation) ([place])   2. SVT (supraventricular tachycardia) ([place])   3. Hyperlipidemia, unspecified hyperlipidemia type   4. DOE (dyspnea on exertion)    PLAN:     Atrial fibrillation: paroxysmal, on propafenone 225 mg 3 times daily and diltiazem 120 mg daily.  CHADS-VASc 1 given age.  Cardiac monitor 04/22/2019 shows no evidence of recurrent A. fib TTE on 04/02/2019 showed normal biventricular function, no significant valvular disease - Discussed with patient that with CHADS-VASc 1 could consider anticoagulation but he prefers to hold off for now - Continue propafenone and diltiazem.  No structural heart disease on TTE 04/02/2019  SVT: Having intermittent episodes on monitor, longest lasting up to 11 seconds.  Reports palpitations have resolved.  Continue diltiazem and propafenone as above  Dyspnea: Following with pulmonology, CT chest 11/30/2019 with findings consistent with sequela of prior inflammation.  PFTs on 12/12/2019 with findings suggestive of restriction but normal lung volumes.  Diastolic dysfunction could be contributing, though BNP checked at last clinic visit and was unremarkable (21).  He reports  dyspnea has resolved with treatment of recent pneumonia.  Hyperlipidemia: LDL 101 on 06/10/2019.  Started on atorvastatin but unable to tolerate due to myalgias.  Reports he has made dietary changes, will recheck lipid panel.  If LDL remains elevated could consider trial of pitavastatin  RTC in 6 months  Medication Adjustments/Labs and Tests Ordered: Current medicines are reviewed at length with the patient today.  Concerns regarding medicines are outlined above.  Orders Placed This Encounter  Procedures  . Lipid panel  . EKG 12-Lead   No orders of the defined types were placed in this encounter.   Patient Instructions  Medication Instructions:  Your physician recommends that you continue on your current medications as directed. Please refer to the Current Medication list given to you today.  *If you need a refill on your cardiac medications before your next appointment, please call your pharmacy*   Lab Work: Lipid today  If you have labs (blood work) drawn today and your tests are completely normal, you will receive your results only by: Marland Kitchen MyChart Message (if you have MyChart) OR . A paper copy in the mail If you have any lab test that is abnormal or we need to change your treatment, we will call you to review the results.  Follow-Up: At [facility], you and your health needs are our priority.  As part of our continuing mission to provide you with exceptional heart care, we have created designated Provider Care Teams.  These Care Teams include your primary Cardiologist (physician) and Advanced Practice Providers (APPs -  Physician Assistants and Nurse Practitioners) who all work together to provide you with the care you need, when you need it.  We recommend signing up for the patient portal called "MyChart".  Sign up information is provided on this After Visit Summary.  MyChart is used to connect with patients for Virtual Visits (Telemedicine).  Patients are able to view  lab/test results, encounter notes, upcoming appointments, etc.  Non-urgent messages can be sent to your provider as well.   To learn more about what you can do with MyChart, go to [email or web address].    Your next appointment:   6 month(s)  The format for your next appointment:   In Person  Provider:   Oswaldo Milian, MD        Signed, Donato Heinz, MD  12/26/2019 9:46 AM    [place]

## 2019-12-26 ENCOUNTER — Ambulatory Visit: Payer: Medicare HMO | Admitting: Cardiology

## 2019-12-26 ENCOUNTER — Encounter: Payer: Self-pay | Admitting: Cardiology

## 2019-12-26 ENCOUNTER — Other Ambulatory Visit: Payer: Self-pay

## 2019-12-26 VITALS — BP 102/60 | HR 70 | Ht 73.0 in | Wt 199.4 lb

## 2019-12-26 DIAGNOSIS — E785 Hyperlipidemia, unspecified: Secondary | ICD-10-CM

## 2019-12-26 DIAGNOSIS — I480 Paroxysmal atrial fibrillation: Secondary | ICD-10-CM

## 2019-12-26 DIAGNOSIS — R0609 Other forms of dyspnea: Secondary | ICD-10-CM

## 2019-12-26 DIAGNOSIS — R0600 Dyspnea, unspecified: Secondary | ICD-10-CM | POA: Diagnosis not present

## 2019-12-26 DIAGNOSIS — I471 Supraventricular tachycardia: Secondary | ICD-10-CM

## 2019-12-26 LAB — LIPID PANEL
Chol/HDL Ratio: 2.8 ratio (ref 0.0–5.0)
Cholesterol, Total: 186 mg/dL (ref 100–199)
HDL: 67 mg/dL (ref >39)
LDL Chol Calc (NIH): 89 mg/dL (ref 0–99)
Triglycerides: 176 mg/dL — ABNORMAL HIGH (ref 0–149)
VLDL Cholesterol Cal: 30 mg/dL (ref 5–40)

## 2019-12-26 NOTE — Patient Instructions (Signed)
Medication Instructions:  Your physician recommends that you continue on your current medications as directed. Please refer to the Current Medication list given to you today.  *If you need a refill on your cardiac medications before your next appointment, please call your pharmacy*   Lab Work: Lipid today  If you have labs (blood work) drawn today and your tests are completely normal, you will receive your results only by: . MyChart Message (if you have MyChart) OR . A paper copy in the mail If you have any lab test that is abnormal or we need to change your treatment, we will call you to review the results.   Follow-Up: At [facility], you and your health needs are our priority.  As part of our continuing mission to provide you with exceptional heart care, we have created designated Provider Care Teams.  These Care Teams include your primary Cardiologist (physician) and Advanced Practice Providers (APPs -  Physician Assistants and Nurse Practitioners) who all work together to provide you with the care you need, when you need it.  We recommend signing up for the patient portal called "MyChart".  Sign up information is provided on this After Visit Summary.  MyChart is used to connect with patients for Virtual Visits (Telemedicine).  Patients are able to view lab/test results, encounter notes, upcoming appointments, etc.  Non-urgent messages can be sent to your provider as well.   To learn more about what you can do with MyChart, go to [email or web address].    Your next appointment:   6 month(s)  The format for your next appointment:   In Person  Provider:   Christopher Schumann, MD    

## 2020-01-02 MED ORDER — PRAVASTATIN SODIUM 20 MG PO TABS
20.0000 mg | ORAL_TABLET | Freq: Every evening | ORAL | 3 refills | Status: DC
Start: 2020-01-02 — End: 2020-01-15

## 2020-01-12 NOTE — Progress Notes (Addendum)
COVID Vaccine Completed: Date COVID Vaccine completed: COVID vaccine manufacturer: [place]   PCP - Addison Naegeli, MD Cardiologist - Gayla Doss, MD LOV 12-26-19  F/U 6 months Pulmonologist - LOV 12-15-19 f/u in 6 months  No Back Stimulators   Chest x-ray - 11-28-19 EKG - 12-26-19 Stress Test -  ECHO - 04-02-19 Cardiac Cath -   Sleep Study -  CPAP -   Fasting Blood Sugar -  Checks Blood Sugar _____ times a day  Blood Thinner Instructions: Aspirin Instructions: Per pt, no instructions given to hold  Last Dose:  ADL's w/o SOB  Anesthesia review:   Patient denies shortness of breath, fever, cough and chest pain at PAT appointment   Patient verbalized understanding of instructions that were given to them at the PAT appointment. Patient was also instructed that they will need to review over the PAT instructions again at home before surgery.

## 2020-01-12 NOTE — Patient Instructions (Addendum)
DUE TO COVID-19 ONLY ONE VISITOR IS ALLOWED TO COME WITH YOU AND STAY IN THE WAITING ROOM ONLY DURING PRE OP AND PROCEDURE DAY OF SURGERY. THE 1 VISITOR MAY VISIT WITH YOU AFTER SURGERY IN YOUR PRIVATE ROOM DURING VISITING HOURS ONLY!  YOU NEED TO HAVE A COVID 19 TEST ON 01-22-20 @ 9:55 AM, THIS TEST MUST BE DONE BEFORE SURGERY, COME  Thomas Dominguez, Thomas Dominguez , [phone].  ([place]) ONCE YOUR COVID TEST IS COMPLETED, PLEASE BEGIN THE QUARANTINE INSTRUCTIONS AS OUTLINED IN YOUR HANDOUT.                Thomas Dominguez  01/12/2020   Your procedure is scheduled on: 01-26-20   Report to [facility] Main  Entrance    Report to Admitting at 8:00 AM     Call this number if you have problems the morning of surgery [phone]    Remember: Do not eat food or drink liquids :After Midnight.     Take these medicines the morning of surgery with A SIP OF WATER:  Omeprazole (Prilosec), Venlafaxine XR (Effexor-XR), Propafenane (Rythomol), Tamsulosin (Flomax)  BRUSH YOUR TEETH MORNING OF SURGERY AND RINSE YOUR MOUTH OUT, NO CHEWING GUM CANDY OR MINTS.                                You may not have any metal on your body including hair pins and              piercings     Do not wear jewelry, cologne, lotions, powders or perfumes, deodorant                        Men may shave face and neck.   Do not bring valuables to the hospital. [place].  Contacts, dentures or bridgework may not be worn into surgery.       Patients discharged the day of surgery will not be allowed to drive home. IF YOU ARE HAVING SURGERY AND GOING HOME THE SAME DAY, YOU MUST HAVE AN ADULT TO DRIVE YOU HOME AND BE WITH YOU FOR 24 HOURS. YOU MAY GO HOME BY TAXI OR UBER OR ORTHERWISE, BUT AN ADULT MUST ACCOMPANY YOU HOME AND STAY WITH YOU FOR 24 HOURS.  Name and phone number of your driver:Thomas Dominguez [phone]  Special Instructions: N/A               Please read over the following fact sheets you were given: _____________________________________________________________________             [facility] - Preparing for Surgery Before surgery, you can play an important role.  Because skin is not sterile, your skin needs to be as free of germs as possible.  You can reduce the number of germs on your skin by washing with CHG (chlorahexidine gluconate) soap before surgery.  CHG is an antiseptic cleaner which kills germs and bonds with the skin to continue killing germs even after washing. Please DO NOT use if you have an allergy to CHG or antibacterial soaps.  If your skin becomes reddened/irritated stop using the CHG and inform your nurse when you arrive at Short Stay. Do not shave (including legs and underarms) for at least 48 hours prior to the first  CHG shower.  You may shave your face/neck. Please follow these instructions carefully:  1.  Shower with CHG Soap the night before surgery and the  morning of Surgery.  2.  If you choose to wash your hair, wash your hair first as usual with your  normal  shampoo.  3.  After you shampoo, rinse your hair and body thoroughly to remove the  shampoo.                           4.  Use CHG as you would any other liquid soap.  You can apply chg directly  to the skin and wash                       Gently with a scrungie or clean washcloth.  5.  Apply the CHG Soap to your body ONLY FROM THE NECK DOWN.   Do not use on face/ open                           Wound or open sores. Avoid contact with eyes, ears mouth and genitals (private parts).                       Wash face,  Genitals (private parts) with your normal soap.             6.  Wash thoroughly, paying special attention to the area where your surgery  will be performed.  7.  Thoroughly rinse your body with warm water from the neck down.  8.  DO NOT shower/wash with your normal soap after using and rinsing off  the CHG Soap.                9.   Pat yourself dry with a clean towel.            10.  Wear clean pajamas.            11.  Place clean sheets on your bed the night of your first shower and do not  sleep with pets. Day of Surgery : Do not apply any lotions/deodorants the morning of surgery.  Please wear clean clothes to the hospital/surgery center.  FAILURE TO FOLLOW THESE INSTRUCTIONS MAY RESULT IN THE CANCELLATION OF YOUR SURGERY PATIENT SIGNATURE_________________________________  NURSE SIGNATURE__________________________________  ________________________________________________________________________

## 2020-01-13 MED ORDER — PROPAFENONE HCL 225 MG PO TABS: ORAL_TABLET | ORAL | 3 refills | Status: DC

## 2020-01-15 ENCOUNTER — Encounter (HOSPITAL_COMMUNITY): Payer: Self-pay

## 2020-01-15 ENCOUNTER — Other Ambulatory Visit: Payer: Self-pay

## 2020-01-15 ENCOUNTER — Encounter (HOSPITAL_COMMUNITY)
Admission: RE | Admit: 2020-01-15 | Discharge: 2020-01-15 | Disposition: A | Discharge status: Home / Self Care | Payer: Medicare HMO | Source: Ambulatory Visit | Attending: Surgery | Admitting: Surgery

## 2020-01-15 DIAGNOSIS — Z01812 Encounter for preprocedural laboratory examination: Secondary | ICD-10-CM | POA: Diagnosis not present

## 2020-01-15 HISTORY — DX: Cardiac arrhythmia, unspecified: I49.9

## 2020-01-15 HISTORY — DX: Essential (primary) hypertension: I10

## 2020-01-15 LAB — CBC
HCT: 44.9 % (ref 39.0–52.0)
Hemoglobin: 15 g/dL (ref 13.0–17.0)
MCH: 33 pg (ref 26.0–34.0)
MCHC: 33.4 g/dL (ref 30.0–36.0)
MCV: 98.9 fL (ref 80.0–100.0)
Platelets: 171 10*3/uL (ref 150–400)
RBC: 4.54 MIL/uL (ref 4.22–5.81)
RDW: 12.5 % (ref 11.5–15.5)
WBC: 5.3 10*3/uL (ref 4.0–10.5)
nRBC: 0 % (ref 0.0–0.2)

## 2020-01-15 LAB — BASIC METABOLIC PANEL
Anion gap: 11 (ref 5–15)
BUN: 13 mg/dL (ref 8–23)
CO2: 26 mmol/L (ref 22–32)
Calcium: 9.6 mg/dL (ref 8.9–10.3)
Chloride: 102 mmol/L (ref 98–111)
Creatinine, Ser: 0.86 mg/dL (ref 0.61–1.24)
GFR calc Af Amer: >60 mL/min (ref >60)
GFR calc non Af Amer: >60 mL/min (ref >60)
Glucose, Bld: 117 mg/dL — ABNORMAL HIGH (ref 70–99)
Potassium: 4.5 mmol/L (ref 3.5–5.1)
Sodium: 139 mmol/L (ref 135–145)

## 2020-01-20 NOTE — Anesthesia Preprocedure Evaluation (Addendum)
Anesthesia Evaluation  Patient identified by MRN, date of birth, ID band Patient awake    Reviewed: Allergy & Precautions, NPO status , Patient's Chart, lab work & pertinent test results  Airway Mallampati: II  TM Distance: >3 FB     Dental   Pulmonary    breath sounds clear to auscultation       Cardiovascular hypertension, + dysrhythmias  Rhythm:Regular Rate:Normal     Neuro/Psych  Headaches, Depression    GI/Hepatic hiatal hernia, GERD  ,  Endo/Other  negative endocrine ROS  Renal/GU negative Renal ROS     Musculoskeletal  (+) Arthritis ,   Abdominal   Peds  Hematology   Anesthesia Other Findings   Reproductive/Obstetrics                            Anesthesia Physical Anesthesia Plan  ASA: III  Anesthesia Plan: General   Post-op Pain Management:    Induction: Intravenous  PONV Risk Score and Plan: 3 and Ondansetron, Dexamethasone and Midazolam  Airway Management Planned: Oral ETT  Additional Equipment:   Intra-op Plan:   Post-operative Plan: Extubation in OR  Informed Consent: I have reviewed the patients History and Physical, chart, labs and discussed the procedure including the risks, benefits and alternatives for the proposed anesthesia with the patient or authorized representative who has indicated his/her understanding and acceptance.     Dental advisory given  Plan Discussed with: CRNA and Anesthesiologist  Anesthesia Plan Comments: (See PAT note 01/15/2020, Konrad Felix, PA-C)      Anesthesia Quick Evaluation

## 2020-01-20 NOTE — Progress Notes (Signed)
Anesthesia Chart Review   Case: [id number] Date/Time: 01/26/20 0945   Procedure: RIGHT INFERIOR PARATHYROIDECTOMY (Right )   Anesthesia type: General   Pre-op diagnosis: PRIMARY HYPERPARATHYROIDISM   Location: [facility] ROOM 02 / [facility]   Surgeons: [name], MD      DISCUSSION:73 y.o. never smoker with h/o PAF (on propafenone and diltiazem, no anticoagulation at this time CHADS-VASc 1), GERD, multiple back surgeries with hardware in place L2-S1, primary hyperparathyroidism scheduled for above procedure 01/26/2020 with Dr. [name].   Pt last seen by cardiologist, Dr. Oswaldo Milian, 12/26/2019.  Per OV note pt denies chest pain, dysnpea and is able to walk his dog 1.5-2 miles per day without exertional sx.    Last seen by pulmonology 12/15/2019.  Per OV note PFT with pattern of restriction in full vital capacity, but normal lung volumes.  DOE resolved after resolution of PNA.    Anticipate pt can proceed with planned procedure barring acute status change.   VS: BP 108/63   Pulse 85   Temp 36.4 C (Oral)   Resp 16   Ht 6\' 1"  (1.854 m)   Wt 93 kg   SpO2 98%   BMI 27.05 kg/m   PROVIDERS: Benay Pike, MD is PCP   Oswaldo Milian, MD is Cardiologist  LABS: Labs reviewed: Acceptable for surgery. (all labs ordered are listed, but only abnormal results are displayed)  Labs Reviewed  BASIC METABOLIC PANEL - Abnormal; Notable for the following components:      Result Value   Glucose, Bld 117 (*)    All other components within normal limits  CBC     IMAGES:   EKG: 12/26/2019 Rate 70 bpm NSR  CV: Echo 04/02/2019 IMPRESSIONS    1. Left ventricular ejection fraction, by visual estimation, is 55 to  60%. The left ventricle has normal function. There is no left ventricular  hypertrophy.  2. Left ventricular diastolic Doppler parameters are consistent with  pseudonormalization pattern of LV diastolic filling.  3. Global right ventricle has normal systolic  function.The right  ventricular size is mildly enlarged. No increase in right ventricular wall  thickness.  4. Left atrial size was normal.  5. Right atrial size was normal.  6. The mitral valve is normal in structure. No evidence of mitral valve  regurgitation. No evidence of mitral stenosis.  7. The tricuspid valve is normal in structure. Tricuspid valve  regurgitation is trivial.  8. The aortic valve is normal in structure. Aortic valve regurgitation  was not visualized by color flow Doppler.  9. The pulmonic valve was normal in structure. Pulmonic valve  regurgitation is not visualized by color flow Doppler.  10. Normal pulmonary artery systolic pressure.  11. The atrial septum is grossly normal  Exercise Treadmill Stress Test 01/03/2018 CONCLUSIONS:  1. The patient exercised for 3 minutes and 50 seconds on a Bruce protocol achieving 6.60 METS and 84% of the MPHR.   2. Hypertensive Blood Pressure and Heart Rate Response to stress.   3. Normal symptomatic and ECG response to exercise.   Past Medical History:  Diagnosis Date  . Acid reflux disease 01/17/2019  . Atrial fibrillation ([place]) 01/17/2019  . BPH (benign prostatic hyperplasia) 01/17/2019  . Degenerative disc disease, lumbar 01/17/2019  . Depression, major, single episode, complete remission ([place]) 01/17/2019  . Diverticulosis 01/17/2019  . DVT (deep venous thrombosis) ([place]) 01/17/2019  . Dysrhythmia   . Esophageal cancer ([place])   . GERD (gastroesophageal reflux disease)   .  H/O small bowel obstruction 01/17/2019  . Hiatal hernia 01/17/2019  . Hypertension   . SBO (small bowel obstruction) ([place]) 11/20/2019    Past Surgical History:  Procedure Laterality Date  . ABDOMINAL HERNIA REPAIR  2012, 2014, 2017   ventral  . COLONOSCOPY  2019  . ESOPHAGECTOMY  1997   for esophageal cancer  . SPINAL FUSION  2018, 2020   L2 L5 in 2018, L5-S1 2020    MEDICATIONS: . aspirin EC 81 MG tablet  . budesonide-formoterol  (SYMBICORT) 80-4.5 MCG/ACT inhaler  . diltiazem (CARDIZEM) 120 MG tablet  . methocarbamol (ROBAXIN) 750 MG tablet  . omeprazole (PRILOSEC) 40 MG capsule  . propafenone (RYTHMOL) 225 MG tablet  . tamsulosin (FLOMAX) 0.4 MG CAPS capsule  . traMADol (ULTRAM) 50 MG tablet  . venlafaxine XR (EFFEXOR-XR) 150 MG 24 hr capsule   No current facility-administered medications for this encounter.     Konrad Felix, PA-C [facility] [phone]

## 2020-01-22 ENCOUNTER — Other Ambulatory Visit (HOSPITAL_COMMUNITY)
Admission: RE | Admit: 2020-01-22 | Discharge: 2020-01-22 | Disposition: A | Discharge status: Home / Self Care | Payer: Medicare HMO | Source: Ambulatory Visit | Attending: Surgery | Admitting: Surgery

## 2020-01-22 DIAGNOSIS — Z01812 Encounter for preprocedural laboratory examination: Secondary | ICD-10-CM | POA: Insufficient documentation

## 2020-01-22 DIAGNOSIS — Z20822 Contact with and (suspected) exposure to covid-19: Secondary | ICD-10-CM | POA: Insufficient documentation

## 2020-01-22 LAB — SARS CORONAVIRUS 2 (TAT 6-24 HRS): SARS Coronavirus 2: NEGATIVE

## 2020-01-25 ENCOUNTER — Encounter (HOSPITAL_COMMUNITY): Payer: Self-pay | Admitting: Surgery

## 2020-01-25 NOTE — H&P (Signed)
General Surgery [facility]  Thomas Dominguez DOB: 19-Dec-1945 Married / Language: English / Race: White Male   History of Present Illness  The patient is a 74 year old male who presents with primary hyperparathyroidism.  CHIEF COMPLAINT: primary hyperparathyroidism  Patient is referred by Dr. Herb Grays for surgical evaluation and management of suspected primary hyperparathyroidism. Patient was noted to have elevated serum calcium levels on repeated laboratory studies. Calcium range from 11.5-12.1. Subsequent PTH level was elevated at 128. No additional studies have been performed. Patient has been symptomatic with fatigue and muscle and joint aches and pains. He denies nephrolithiasis. He has not had a bone density scan. Patient does have a significant past surgical history including esophagectomy for carcinoma at [facility] in 1997. Patient also has atrial fibrillation. He takes aspirin as an anticoagulant. Patient has no prior history of other endocrine neoplasms. There is no family history of parathyroid disease. Patient is retired.   Past Surgical History  Foot Surgery  Left. Oral Surgery  Spinal Surgery - Lower Back  Vasectomy  Ventral / Umbilical Hernia Surgery  multiple  Diagnostic Studies History  Colonoscopy  1-5 years ago  Allergies  Mobic *ANALGESICS - ANTI-INFLAMMATORY*  Statins   Medication History  traMADol HCl (50MG  Tablet, Oral) Active. dilTIAZem HCl (120MG  Tablet, Oral) Active. Methocarbamol (750MG  Tablet, Oral) Active. Omeprazole (40MG  Capsule DR, Oral) Active. Propafenone HCl (225MG  Tablet, Oral) Active. Tamsulosin HCl (0.4MG  Capsule, Oral) Active. Sucralfate (1GM Tablet, Oral) Active. Venlafaxine HCl ER (150MG  Capsule ER 24HR, Oral) Active. Medications Reconciled  Social History Alcohol use  Occasional alcohol use. Caffeine use  Coffee, Tea. Tobacco use  Never smoker.  Family  History Respiratory Condition  Father.  Other Problems  Anxiety Disorder  Atrial Fibrillation  Back Pain  Cancer  Chest pain  Depression  Diverticulosis  Enlarged Prostate  Gastroesophageal Reflux Disease  Heart murmur  Hypercholesterolemia  Ventral Hernia Repair   Review of Systems  General Present- Fatigue and Weight Gain. Not Present- Appetite Loss, Chills, Fever, Night Sweats and Weight Loss. HEENT Present- Hearing Loss and Wears glasses/contact lenses. Not Present- Earache, Hoarseness, Nose Bleed, Oral Ulcers, Ringing in the Ears, Seasonal Allergies, Sinus Pain, Sore Throat, Visual Disturbances and Yellow Eyes. Respiratory Not Present- Bloody sputum, Chronic Cough, Difficulty Breathing, Snoring and Wheezing. Breast Not Present- Breast Mass, Breast Pain, Nipple Discharge and Skin Changes. Cardiovascular Present- Chest Pain, Rapid Heart Rate and Shortness of Breath. Not Present- Difficulty Breathing Lying Down, Leg Cramps, Palpitations and Swelling of Extremities. Gastrointestinal Present- Difficulty Swallowing, Indigestion and Nausea. Not Present- Abdominal Pain, Bloating, Bloody Stool, Change in Bowel Habits, Chronic diarrhea, Constipation, Excessive gas, Gets full quickly at meals, Hemorrhoids, Rectal Pain and Vomiting. Male Genitourinary Not Present- Blood in Urine, Change in Urinary Stream, Frequency, Impotence, Nocturia, Painful Urination, Urgency and Urine Leakage. Musculoskeletal Present- Back Pain, Joint Pain, Muscle Pain and Muscle Weakness. Not Present- Joint Stiffness and Swelling of Extremities. Neurological Present- Weakness. Not Present- Decreased Memory, Fainting, Headaches, Numbness, Seizures, Tingling, Tremor and Trouble walking. Psychiatric Present- Anxiety and Depression. Not Present- Bipolar, Change in Sleep Pattern, Fearful and Frequent crying. Endocrine Not Present- Cold Intolerance, Excessive Hunger, Hair Changes, Heat Intolerance, Hot flashes and  New Diabetes. Hematology Present- Blood Thinners. Not Present- Easy Bruising, Excessive bleeding, Gland problems, HIV and Persistent Infections.  Vitals  Weight: 203.38 lb Height: 732in Body Surface Area: 11.53 m Body Mass Index: 0.27 kg/m  Temp.: 1F  Pulse: 105 (Regular)  P.OX:  94% (Room air) BP: 112/64(Sitting, Left Arm, Standard)  Physical Exam  GENERAL APPEARANCE Development: normal Nutritional status: normal Gross deformities: none  SKIN Rash, lesions, ulcers: none Induration, erythema: none Nodules: none palpable  EYES Conjunctiva and lids: normal Pupils: equal and reactive Iris: normal bilaterally  EARS, NOSE, MOUTH, THROAT External ears: no lesion or deformity External nose: no lesion or deformity Hearing: grossly normal Due to Covid-19 pandemic, patient is wearing a mask.  NECK Symmetric: yes Trachea: midline Thyroid: no palpable nodules in the thyroid bed  CHEST Respiratory effort: normal Retraction or accessory muscle use: no Breath sounds: normal bilaterally Rales, rhonchi, wheeze: none  CARDIOVASCULAR Auscultation: regular rhythm, normal rate Murmurs: none Pulses: radial pulse 2+ palpable Lower extremity edema: mild bilateral  MUSCULOSKELETAL Station and gait: normal Digits and nails: no clubbing or cyanosis Muscle strength: grossly normal all extremities Range of motion: grossly normal all extremities Deformity: none  LYMPHATIC Cervical: none palpable Supraclavicular: none palpable  PSYCHIATRIC Oriented to person, place, and time: yes Mood and affect: normal for situation Judgment and insight: appropriate for situation    Assessment & Plan   PRIMARY HYPERPARATHYROIDISM (E21.0)  Follow Up - Call [facility] office after tests / studies doneto discuss further plans  Patient is referred by his primary care physician for surgical evaluation and recommendations regarding suspected primary hyperparathyroidism.  Patient  provided with a copy of "Parathyroid Surgery: Treatment for Your Parathyroid Gland Problem", published by [employer], 12 pages. Book reviewed and explained to patient during visit today.  Patient was noted to have elevated serum calcium levels and elevated intact PTH level. He has had issues with fatigue. He has also had some muscle and joint discomfort. We will continue with his evaluation to include a 25-hydroxy vitamin D level and a 24-hour urine collection for calcium. We will schedule [facility] with [facility] imaging for an ultrasound examination of the neck as well as a nuclear medicine parathyroid scan. Hopefully these studies will confirm the diagnosis and the imaging studies will allow for localization of the parathyroid adenoma. If these studies do not localize the adenoma, then we will consider performing a 4D CT scan with parathyroid protocol in hopes of identifying the location of the adenoma. Patient agrees to proceed with these studies.  We discussed parathyroidectomy. This would be done as an outpatient surgical procedure through a minimally invasive approach. We discussed the traditional 4 gland exploration. Hopefully that procedure will not be necessary.  Patient will undergo diagnostic studies and we will contact him with the results when they are available.  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 Recommend minimally invasive parathyroidectomy.  Called patient with results.  The risks and benefits of the procedure have been discussed at length with the patient.  The patient understands the proposed procedure, potential alternative treatments, and the course of recovery to be expected.  All of the patient's questions have been answered at this time.  The patient wishes to proceed with surgery.  [name], MD [facility] Office: [phone]

## 2020-01-26 ENCOUNTER — Ambulatory Visit (HOSPITAL_COMMUNITY): Payer: Medicare HMO | Admitting: Physician Assistant

## 2020-01-26 ENCOUNTER — Encounter (HOSPITAL_COMMUNITY): Payer: Self-pay | Admitting: Surgery

## 2020-01-26 ENCOUNTER — Ambulatory Visit (HOSPITAL_COMMUNITY)
Admission: RE | Admit: 2020-01-26 | Discharge: 2020-01-26 | Disposition: A | Discharge status: Home / Self Care | Payer: Medicare HMO | Source: Ambulatory Visit | Attending: Surgery | Admitting: Surgery

## 2020-01-26 ENCOUNTER — Other Ambulatory Visit: Payer: Self-pay

## 2020-01-26 ENCOUNTER — Ambulatory Visit (HOSPITAL_COMMUNITY): Payer: Medicare HMO | Admitting: Certified Registered Nurse Anesthetist

## 2020-01-26 ENCOUNTER — Encounter (HOSPITAL_COMMUNITY)
Admission: RE | Disposition: A | Discharge status: Home / Self Care | Payer: Self-pay | Source: Ambulatory Visit | Attending: Surgery

## 2020-01-26 DIAGNOSIS — Z79899 Other long term (current) drug therapy: Secondary | ICD-10-CM | POA: Insufficient documentation

## 2020-01-26 DIAGNOSIS — E210 Primary hyperparathyroidism: Secondary | ICD-10-CM | POA: Diagnosis not present

## 2020-01-26 DIAGNOSIS — F329 Major depressive disorder, single episode, unspecified: Secondary | ICD-10-CM | POA: Diagnosis not present

## 2020-01-26 DIAGNOSIS — F419 Anxiety disorder, unspecified: Secondary | ICD-10-CM | POA: Diagnosis not present

## 2020-01-26 DIAGNOSIS — D351 Benign neoplasm of parathyroid gland: Secondary | ICD-10-CM | POA: Insufficient documentation

## 2020-01-26 DIAGNOSIS — N400 Enlarged prostate without lower urinary tract symptoms: Secondary | ICD-10-CM | POA: Insufficient documentation

## 2020-01-26 DIAGNOSIS — I4891 Unspecified atrial fibrillation: Secondary | ICD-10-CM | POA: Diagnosis not present

## 2020-01-26 DIAGNOSIS — Z7982 Long term (current) use of aspirin: Secondary | ICD-10-CM | POA: Diagnosis not present

## 2020-01-26 DIAGNOSIS — I10 Essential (primary) hypertension: Secondary | ICD-10-CM | POA: Insufficient documentation

## 2020-01-26 DIAGNOSIS — E785 Hyperlipidemia, unspecified: Secondary | ICD-10-CM | POA: Diagnosis not present

## 2020-01-26 DIAGNOSIS — K219 Gastro-esophageal reflux disease without esophagitis: Secondary | ICD-10-CM | POA: Diagnosis not present

## 2020-01-26 DIAGNOSIS — K449 Diaphragmatic hernia without obstruction or gangrene: Secondary | ICD-10-CM | POA: Diagnosis not present

## 2020-01-26 HISTORY — PX: PARATHYROIDECTOMY: SHX19

## 2020-01-26 SURGERY — PARATHYROIDECTOMY
Anesthesia: General | Laterality: Right

## 2020-01-26 MED ORDER — PROPOFOL 10 MG/ML IV BOLUS
INTRAVENOUS | Status: DC | PRN
  Administered 2020-01-26: 160 mg via INTRAVENOUS

## 2020-01-26 MED ORDER — LACTATED RINGERS IV SOLN: INTRAVENOUS | Status: DC

## 2020-01-26 MED ORDER — LIDOCAINE 2% (20 MG/ML) 5 ML SYRINGE
INTRAMUSCULAR | Status: DC | PRN
  Administered 2020-01-26: 80 mg via INTRAVENOUS

## 2020-01-26 MED ORDER — HYDROMORPHONE HCL 1 MG/ML IJ SOLN
INTRAMUSCULAR | Status: AC
  Filled 2020-01-26: qty 1

## 2020-01-26 MED ORDER — HYDROMORPHONE HCL 1 MG/ML IJ SOLN
INTRAMUSCULAR | Status: AC
  Administered 2020-01-26: 0.5 mg via INTRAVENOUS
  Filled 2020-01-26: qty 1

## 2020-01-26 MED ORDER — LIDOCAINE 2% (20 MG/ML) 5 ML SYRINGE
INTRAMUSCULAR | Status: AC
  Filled 2020-01-26: qty 5

## 2020-01-26 MED ORDER — OXYCODONE HCL 5 MG/5ML PO SOLN: 5.0000 mg | Freq: Once | ORAL | Status: AC | PRN

## 2020-01-26 MED ORDER — OXYCODONE HCL 5 MG PO TABS
ORAL_TABLET | ORAL | Status: AC
  Filled 2020-01-26: qty 1

## 2020-01-26 MED ORDER — EPHEDRINE SULFATE-NACL 50-0.9 MG/10ML-% IV SOSY
PREFILLED_SYRINGE | INTRAVENOUS | Status: DC | PRN
  Administered 2020-01-26: 5 mg via INTRAVENOUS
  Administered 2020-01-26: 10 mg via INTRAVENOUS

## 2020-01-26 MED ORDER — EPHEDRINE 5 MG/ML INJ
INTRAVENOUS | Status: AC
  Filled 2020-01-26: qty 10

## 2020-01-26 MED ORDER — BUPIVACAINE HCL 0.25 % IJ SOLN
INTRAMUSCULAR | Status: DC | PRN
  Administered 2020-01-26: 10 mL

## 2020-01-26 MED ORDER — OXYCODONE HCL 5 MG PO TABS
5.0000 mg | ORAL_TABLET | Freq: Once | ORAL | Status: AC | PRN
  Administered 2020-01-26: 5 mg via ORAL

## 2020-01-26 MED ORDER — FENTANYL CITRATE (PF) 100 MCG/2ML IJ SOLN
INTRAMUSCULAR | Status: AC
  Filled 2020-01-26: qty 2

## 2020-01-26 MED ORDER — FENTANYL CITRATE (PF) 100 MCG/2ML IJ SOLN
INTRAMUSCULAR | Status: AC
  Administered 2020-01-26: 50 ug via INTRAVENOUS
  Filled 2020-01-26: qty 2

## 2020-01-26 MED ORDER — TRAMADOL HCL 50 MG PO TABS: 50.0000 mg | ORAL_TABLET | Freq: Four times a day (QID) | ORAL | 0 refills | Status: DC | PRN

## 2020-01-26 MED ORDER — 0.9 % SODIUM CHLORIDE (POUR BTL) OPTIME
TOPICAL | Status: DC | PRN
  Administered 2020-01-26: 1000 mL

## 2020-01-26 MED ORDER — BUPIVACAINE HCL 0.25 % IJ SOLN
INTRAMUSCULAR | Status: AC
  Filled 2020-01-26: qty 1

## 2020-01-26 MED ORDER — ROCURONIUM BROMIDE 10 MG/ML (PF) SYRINGE
PREFILLED_SYRINGE | INTRAVENOUS | Status: DC | PRN
  Administered 2020-01-26: 50 mg via INTRAVENOUS

## 2020-01-26 MED ORDER — HYDROMORPHONE HCL 1 MG/ML IJ SOLN
0.2500 mg | INTRAMUSCULAR | Status: DC | PRN
  Administered 2020-01-26 (×2): 0.5 mg via INTRAVENOUS

## 2020-01-26 MED ORDER — FENTANYL CITRATE (PF) 100 MCG/2ML IJ SOLN
25.0000 ug | INTRAMUSCULAR | Status: DC | PRN
  Administered 2020-01-26: 50 ug via INTRAVENOUS

## 2020-01-26 MED ORDER — ONDANSETRON HCL 4 MG/2ML IJ SOLN
INTRAMUSCULAR | Status: DC | PRN
  Administered 2020-01-26: 4 mg via INTRAVENOUS

## 2020-01-26 MED ORDER — ROCURONIUM BROMIDE 10 MG/ML (PF) SYRINGE
PREFILLED_SYRINGE | INTRAVENOUS | Status: AC
  Filled 2020-01-26: qty 10

## 2020-01-26 MED ORDER — SUGAMMADEX SODIUM 200 MG/2ML IV SOLN
INTRAVENOUS | Status: DC | PRN
  Administered 2020-01-26: 200 mg via INTRAVENOUS

## 2020-01-26 MED ORDER — DEXAMETHASONE SODIUM PHOSPHATE 10 MG/ML IJ SOLN
INTRAMUSCULAR | Status: DC | PRN
  Administered 2020-01-26: 10 mg via INTRAVENOUS

## 2020-01-26 MED ORDER — CEFAZOLIN SODIUM-DEXTROSE 2-4 GM/100ML-% IV SOLN
2.0000 g | INTRAVENOUS | Status: AC
  Administered 2020-01-26: 2 g via INTRAVENOUS
  Filled 2020-01-26: qty 100

## 2020-01-26 MED ORDER — CHLORHEXIDINE GLUCONATE 0.12 % MT SOLN
15.0000 mL | Freq: Once | OROMUCOSAL | Status: AC
  Administered 2020-01-26: 15 mL via OROMUCOSAL

## 2020-01-26 MED ORDER — ORAL CARE MOUTH RINSE: 15.0000 mL | Freq: Once | OROMUCOSAL | Status: AC

## 2020-01-26 MED ORDER — CHLORHEXIDINE GLUCONATE CLOTH 2 % EX PADS: 6.0000 | MEDICATED_PAD | Freq: Once | CUTANEOUS | Status: DC

## 2020-01-26 MED ORDER — PHENYLEPHRINE 40 MCG/ML (10ML) SYRINGE FOR IV PUSH (FOR BLOOD PRESSURE SUPPORT)
PREFILLED_SYRINGE | INTRAVENOUS | Status: AC
  Filled 2020-01-26: qty 10

## 2020-01-26 MED ORDER — ONDANSETRON HCL 4 MG/2ML IJ SOLN
INTRAMUSCULAR | Status: AC
  Filled 2020-01-26: qty 2

## 2020-01-26 MED ORDER — DEXAMETHASONE SODIUM PHOSPHATE 10 MG/ML IJ SOLN
INTRAMUSCULAR | Status: AC
  Filled 2020-01-26: qty 1

## 2020-01-26 MED ORDER — FENTANYL CITRATE (PF) 100 MCG/2ML IJ SOLN
INTRAMUSCULAR | Status: DC | PRN
  Administered 2020-01-26: 50 ug via INTRAVENOUS
  Administered 2020-01-26: 100 ug via INTRAVENOUS

## 2020-01-26 SURGICAL SUPPLY — 32 items
ADH SKN CLS APL DERMABOND .7 (GAUZE/BANDAGES/DRESSINGS) ×1
APL PRP STRL LF DISP 70% ISPRP (MISCELLANEOUS) ×1
ATTRACTOMAT 16X20 MAGNETIC DRP (DRAPES) ×3 IMPLANT
BLADE SURG 15 STRL LF DISP TIS (BLADE) ×1 IMPLANT
BLADE SURG 15 STRL SS (BLADE) ×3
CHLORAPREP W/TINT 26 (MISCELLANEOUS) ×3 IMPLANT
CLIP VESOCCLUDE MED 6/CT (CLIP) ×6 IMPLANT
CLIP VESOCCLUDE SM WIDE 6/CT (CLIP) ×6 IMPLANT
COVER SURGICAL LIGHT HANDLE (MISCELLANEOUS) ×3 IMPLANT
COVER WAND RF STERILE (DRAPES) ×3 IMPLANT
DERMABOND ADVANCED (GAUZE/BANDAGES/DRESSINGS) ×2
DERMABOND ADVANCED .7 DNX12 (GAUZE/BANDAGES/DRESSINGS) ×1 IMPLANT
DRAPE LAPAROTOMY T 98X78 PEDS (DRAPES) ×3 IMPLANT
ELECT REM PT RETURN 15FT ADLT (MISCELLANEOUS) ×3 IMPLANT
GAUZE 4X4 16PLY RFD (DISPOSABLE) ×3 IMPLANT
GLOVE SURG ORTHO 8.0 STRL STRW (GLOVE) ×3 IMPLANT
GOWN STRL REUS W/TWL XL LVL3 (GOWN DISPOSABLE) ×9 IMPLANT
HEMOSTAT SURGICEL 2X4 FIBR (HEMOSTASIS) ×3 IMPLANT
ILLUMINATOR WAVEGUIDE N/F (MISCELLANEOUS) IMPLANT
KIT BASIN OR (CUSTOM PROCEDURE TRAY) ×3 IMPLANT
KIT TURNOVER KIT A (KITS) IMPLANT
NEEDLE HYPO 25X1 1.5 SAFETY (NEEDLE) ×3 IMPLANT
PACK BASIC VI WITH GOWN DISP (CUSTOM PROCEDURE TRAY) ×3 IMPLANT
PENCIL SMOKE EVACUATOR (MISCELLANEOUS) ×3 IMPLANT
SUT MNCRL AB 4-0 PS2 18 (SUTURE) ×3 IMPLANT
SUT VIC AB 3-0 SH 18 (SUTURE) ×3 IMPLANT
SYR BULB IRRIG 60ML STRL (SYRINGE) ×3 IMPLANT
SYR CONTROL 10ML LL (SYRINGE) ×3 IMPLANT
TOWEL OR 17X26 10 PK STRL BLUE (TOWEL DISPOSABLE) ×3 IMPLANT
TOWEL OR NON WOVEN STRL DISP B (DISPOSABLE) ×3 IMPLANT
TUBING CONNECTING 10 (TUBING) ×2 IMPLANT
TUBING CONNECTING 10' (TUBING) ×1

## 2020-01-26 NOTE — Op Note (Signed)
OPERATIVE REPORT - PARATHYROIDECTOMY  Preoperative diagnosis: Primary hyperparathyroidism  Postop diagnosis: Same  Procedure: Right inferior minimally invasive parathyroidectomy  Surgeon:  [name], MD  Assistant:  Carlena Hurl, PA-C  Anesthesia: General endotracheal  Estimated blood loss: Minimal  Preparation: ChloraPrep  Indications: Patient is referred by Dr. Herb Grays for surgical evaluation and management of suspected primary hyperparathyroidism. Patient was noted to have elevated serum calcium levels on repeated laboratory studies. Calcium range from 11.5-12.1. Subsequent PTH level was elevated at 128. No additional studies have been performed. Patient has been symptomatic with fatigue and muscle and joint aches and pains. He denies nephrolithiasis. He has not had a bone density scan. Patient does have a significant past surgical history including esophagectomy for carcinoma at [facility] in 1997.  Nuclear med parathyroid scan and USN localize a right inferior adenoma.  Patient now comes to surgery for parathyroidectomy.  Procedure: The patient was prepared in the pre-operative holding area. The patient was brought to the operating room and placed in a supine position on the operating room table. Following administration of general anesthesia, the patient was positioned and then prepped and draped in the usual strict aseptic fashion. After ascertaining that an adequate level of anesthesia been achieved, a neck incision was made with a #15 blade. Dissection was carried through subcutaneous tissues and platysma. Hemostasis was obtained with the electrocautery. Skin flaps were developed circumferentially and a Weitlander retractor was placed for exposure.  Strap muscles were incised in the midline. Strap muscles were reflected laterally exposing the thyroid lobe. With gentle blunt dissection the thyroid lobe was mobilized.  Dissection was carried through  adipose tissue and an enlarged parathyroid gland was identified. It was gently mobilized. Vascular structures were divided between small ligaclips. Care was taken to avoid the recurrent laryngeal nerve which was immediately along side laterally and the esophagus medially. The parathyroid gland was completely excised. It was submitted to pathology where frozen section confirmed parathyroid tissue consistent with adenoma.  Neck was irrigated with warm saline and good hemostasis was noted. Fibrillar was placed in the operative field. Strap muscles were approximated in the midline with interrupted 3-0 Vicryl sutures. Platysma was closed with interrupted 3-0 Vicryl sutures. Marcaine was infiltrated circumferentially. Skin was closed with a running 4-0 Monocryl subcuticular suture. Wound was washed and dried and Dermabond was applied. Patient was awakened from anesthesia and brought to the recovery room. The patient tolerated the procedure well.   [name], MD [facility] Office: [phone]

## 2020-01-26 NOTE — Discharge Instructions (Addendum)
[facility]  THYROID & PARATHYROID SURGERY:  POST-OP INSTRUCTIONS  Always review your discharge instruction sheet from the facility where your surgery was performed.  A prescription for pain medication may be given to you upon discharge.  Take your pain medication as prescribed.  If narcotic pain medicine is not needed, then you may take acetaminophen (Tylenol) or ibuprofen (Advil) as needed.  Take your usually prescribed medications unless otherwise directed.  If you need a refill on your pain medication, please contact our office during regular business hours.  Prescriptions cannot be processed by our office after 5 pm or on weekends.  Start with a light diet upon arrival home, such as soup and crackers or toast.  Be sure to drink plenty of fluids daily.  Resume your normal diet the day after surgery.  Most patients will experience some swelling and bruising on the chest and neck area.  Ice packs will help.  Swelling and bruising can take several days to resolve.   It is common to experience some constipation after surgery.  Increasing fluid intake and taking a stool softener (Colace) will usually help or prevent this problem.  A mild laxative (Milk of Magnesia or Miralax) should be taken according to package directions if there has been no bowel movement after 48 hours.  You have steri-strips and a gauze dressing over your incision.  You may remove the gauze bandage on the second day after surgery, and you may shower at that time.  Leave your steri-strips (small skin tapes) in place directly over the incision.  These strips should remain on the skin for 5-7 days and then be removed.  You may get them wet in the shower and pat them dry.  You may resume regular (light) daily activities beginning the next day (such as daily self-care, walking, climbing stairs) gradually increasing activities as tolerated.  You may have sexual intercourse when it is comfortable.  Refrain from  any heavy lifting or straining until approved by your doctor.  You may drive when you no longer are taking prescription pain medication, you can comfortably wear a seatbelt, and you can safely maneuver your car and apply brakes.  You should see your doctor in the office for a follow-up appointment approximately three weeks after your surgery.  Make sure that you call for this appointment within a day or two after you arrive home to insure a convenient appointment time.  WHEN TO CALL YOUR DOCTOR: -- Fever greater than 101.5 -- Inability to urinate -- Nausea and/or vomiting - persistent -- Extreme swelling or bruising -- Continued bleeding from incision -- Increased pain, redness, or drainage from the incision -- Difficulty swallowing or breathing -- Muscle cramping or spasms -- Numbness or tingling in hands or around lips  The clinic staff is available to answer your questions during regular business hours.  Please don't hesitate to call and ask to speak to one of the nurses if you have concerns.  Todd Gerkin, MD [facility] Office: [phone] 

## 2020-01-26 NOTE — Interval H&P Note (Signed)
History and Physical Interval Note:  01/26/2020 9:32 AM  Thomas Dominguez  has presented today for surgery, with the diagnosis of PRIMARY HYPERPARATHYROIDISM.  The various methods of treatment have been discussed with the patient and family. After consideration of risks, benefits and other options for treatment, the patient has consented to    Procedure(s): RIGHT INFERIOR PARATHYROIDECTOMY (Right) as a surgical intervention.    The patient's history has been reviewed, patient examined, no change in status, stable for surgery.  I have reviewed the patient's chart and labs.  Questions were answered to the patient's satisfaction.    [name], MD [facility] Office: [place]

## 2020-01-26 NOTE — Anesthesia Procedure Notes (Signed)
Procedure Name: Intubation Date/Time: 01/26/2020 9:56 AM Performed by: Genelle Bal, CRNA Pre-anesthesia Checklist: Patient identified, Emergency Drugs available, Suction available and Patient being monitored Patient Re-evaluated:Patient Re-evaluated prior to induction Oxygen Delivery Method: Circle system utilized Preoxygenation: Pre-oxygenation with 100% oxygen Induction Type: IV induction Ventilation: Mask ventilation without difficulty Laryngoscope Size: Miller and 2 Grade View: Grade I Tube type: Oral Tube size: 7.0 mm Number of attempts: 1 Airway Equipment and Method: Stylet and Oral airway Placement Confirmation: ETT inserted through vocal cords under direct vision,  positive ETCO2 and breath sounds checked- equal and bilateral Secured at: 21 cm Tube secured with: Tape Dental Injury: Teeth and Oropharynx as per pre-operative assessment

## 2020-01-26 NOTE — Transfer of Care (Signed)
Immediate Anesthesia Transfer of Care Note  Patient: Thomas Dominguez. [name]  Procedure(s) Performed: RIGHT INFERIOR PARATHYROIDECTOMY (Right )  Patient Location: PACU  Anesthesia Type:General  Level of Consciousness: sedated  Airway & Oxygen Therapy: Patient Spontanous Breathing and Patient connected to face mask oxygen  Post-op Assessment: Report given to RN and Post -op Vital signs reviewed and stable  Post vital signs: Reviewed and stable  Last Vitals:  Vitals Value Taken Time  BP    Temp    Pulse 81 01/26/20 1056  Resp 12 01/26/20 1056  SpO2 99 % 01/26/20 1056  Vitals shown include unvalidated device data.  Last Pain:  Vitals:   01/26/20 0826  TempSrc:   PainSc: 0-No pain      Patients Stated Pain Goal: 4 (75/43/60 6770)  Complications: No complications documented.

## 2020-01-26 NOTE — Anesthesia Postprocedure Evaluation (Signed)
Anesthesia Post Note  Patient: Thomas Dominguez  Procedure(s) Performed: RIGHT INFERIOR PARATHYROIDECTOMY (Right )     Patient location during evaluation: PACU Anesthesia Type: General Level of consciousness: awake Pain management: pain level controlled Vital Signs Assessment: post-procedure vital signs reviewed and stable Respiratory status: spontaneous breathing Cardiovascular status: stable Postop Assessment: no apparent nausea or vomiting Anesthetic complications: no   No complications documented.  Last Vitals:  Vitals:   01/26/20 1215 01/26/20 1300  BP: (!) 148/89 (!) 148/96  Pulse: 75 86  Resp: 12 12  Temp: (!) 36.4 C 36.4 C  SpO2: 95% 91%    Last Pain:  Vitals:   01/26/20 1300  TempSrc:   PainSc: 3                  Sharrie Self

## 2020-01-27 ENCOUNTER — Other Ambulatory Visit: Payer: Self-pay

## 2020-01-27 ENCOUNTER — Encounter (HOSPITAL_COMMUNITY): Payer: Self-pay | Admitting: Surgery

## 2020-01-27 LAB — SURGICAL PATHOLOGY

## 2020-01-28 NOTE — Telephone Encounter (Signed)
Spoke with patient on the phone on 01/27/2020 and he confirmed that he did not need any refills for the Symbicort. He had contacted [facility] which had the refills to transfer them over to [facility]. Nothing further needed at this time.

## 2020-02-10 ENCOUNTER — Ambulatory Visit (INDEPENDENT_AMBULATORY_CARE_PROVIDER_SITE_OTHER): Payer: Medicare HMO

## 2020-02-10 ENCOUNTER — Other Ambulatory Visit: Payer: Self-pay

## 2020-02-10 DIAGNOSIS — R0989 Other specified symptoms and signs involving the circulatory and respiratory systems: Secondary | ICD-10-CM | POA: Diagnosis not present

## 2020-02-10 DIAGNOSIS — R05 Cough: Secondary | ICD-10-CM

## 2020-02-10 DIAGNOSIS — R059 Cough, unspecified: Secondary | ICD-10-CM

## 2020-02-10 DIAGNOSIS — R918 Other nonspecific abnormal finding of lung field: Secondary | ICD-10-CM | POA: Diagnosis not present

## 2020-02-10 DIAGNOSIS — K449 Diaphragmatic hernia without obstruction or gangrene: Secondary | ICD-10-CM | POA: Diagnosis not present

## 2020-02-10 NOTE — Telephone Encounter (Signed)
02/10/2020  Patient last completed a high-resolution CT chest on 11/30/2019.  Showing extensive irregular consolidation of subtly nodular airspace opacities.  Unchanged from prior exam on 09/22/2019.  Yes okay to place order for chest x-ray.  Can place that.  Patient having any fevers?  Any recent antibiotic use?  [name], FNP

## 2020-02-10 NOTE — Telephone Encounter (Signed)
cxr ordered.  Pt aware. Pt verified that he does not have any fever, has not recently been on any abx.   Forwarding back to [place] as FYI.  Pt will be coming to office this afternoon for cxr. Thanks!

## 2020-02-10 NOTE — Telephone Encounter (Signed)
Due to the nature of MyChart message I called pt.  States that he had a parathyroidectomy on 8/2, on 8/14 began experiencing increased SOB with exertion, sometimes prod cough with thick yellow/white/clear mucus worse qam.  Denies chest pain, fever, chills, nausea, vomiting, headache, loss of taste/smell.   Pt has been taking Mucinex BID to help with symptoms but has not made a difference in mucus.  Pt is also using Symbicort 80 2 puffs BID as regularly prescribed.  Pt wonders if he needs a CXR or CT chest to rule out PNA.  Pt did have HRCT 11/30/2019.   Aaron Edelman please advise on recommendations.  Thanks!

## 2020-02-10 NOTE — Telephone Encounter (Signed)
Noted. Thank you.   Thomas Dominguez

## 2020-02-11 ENCOUNTER — Other Ambulatory Visit: Payer: Self-pay | Admitting: Pulmonary Disease

## 2020-02-11 DIAGNOSIS — R0600 Dyspnea, unspecified: Secondary | ICD-10-CM

## 2020-02-11 DIAGNOSIS — E782 Mixed hyperlipidemia: Secondary | ICD-10-CM

## 2020-02-11 DIAGNOSIS — R0609 Other forms of dyspnea: Secondary | ICD-10-CM

## 2020-02-11 MED ORDER — AMOXICILLIN-POT CLAVULANATE 875-125 MG PO TABS
ORAL_TABLET | ORAL | 0 refills | Status: DC
Start: 2020-02-11 — End: 2020-03-17

## 2020-02-13 NOTE — Telephone Encounter (Signed)
Thomas Dominguez please advise on patient email.  Thanks!   Hello, I'm wondering with my current lung issue of some type of infection, should I be taking some sort of precautions when around family and others. Should I stay home or can I go out (although I'm not feeling well enought to go anywhere yet) I have received the covid vaccine x 2. I'm currently taking the augmentin as prescribed and waiting to hear about scheduling the CT.  Thanks, Thomas Dominguez

## 2020-02-13 NOTE — Telephone Encounter (Signed)
02/13/2020  I would recommend standard precautions.  Continue taking antibiotic as prescribed.  Obtain the CT as ordered.  Avoid large crowds, wear mask when in public and follow CDC guidelines.   I would limit exposure to large amounts of people or family or friends especially if they are acutely ill.  [name], FNP

## 2020-02-20 DIAGNOSIS — E210 Primary hyperparathyroidism: Secondary | ICD-10-CM | POA: Diagnosis not present

## 2020-02-20 DIAGNOSIS — E890 Postprocedural hypothyroidism: Secondary | ICD-10-CM | POA: Diagnosis not present

## 2020-02-22 ENCOUNTER — Other Ambulatory Visit: Payer: Self-pay | Admitting: Gastroenterology

## 2020-02-24 ENCOUNTER — Other Ambulatory Visit: Payer: Self-pay | Admitting: Family Medicine

## 2020-02-24 NOTE — Telephone Encounter (Signed)
mychart message received by pt stating that call dropped when trying to get CT scheduled. Routing to PCCs.

## 2020-02-26 ENCOUNTER — Encounter: Payer: Self-pay | Admitting: Internal Medicine

## 2020-03-09 DIAGNOSIS — E785 Hyperlipidemia, unspecified: Secondary | ICD-10-CM

## 2020-03-10 NOTE — Telephone Encounter (Signed)
Could we get him set up for calcium score? Thanks, Gerald Stabs

## 2020-03-11 ENCOUNTER — Ambulatory Visit: Payer: Medicare HMO | Admitting: Internal Medicine

## 2020-03-12 ENCOUNTER — Ambulatory Visit (INDEPENDENT_AMBULATORY_CARE_PROVIDER_SITE_OTHER)
Admission: RE | Admit: 2020-03-12 | Discharge: 2020-03-12 | Disposition: A | Discharge status: Home / Self Care | Payer: Medicare HMO | Source: Ambulatory Visit | Attending: Pulmonary Disease | Admitting: Pulmonary Disease

## 2020-03-12 ENCOUNTER — Other Ambulatory Visit: Payer: Self-pay

## 2020-03-12 ENCOUNTER — Ambulatory Visit (INDEPENDENT_AMBULATORY_CARE_PROVIDER_SITE_OTHER)
Admission: RE | Admit: 2020-03-12 | Discharge: 2020-03-12 | Disposition: A | Discharge status: Home / Self Care | Payer: Self-pay | Source: Ambulatory Visit | Attending: Cardiology | Admitting: Cardiology

## 2020-03-12 DIAGNOSIS — R918 Other nonspecific abnormal finding of lung field: Secondary | ICD-10-CM | POA: Diagnosis not present

## 2020-03-12 DIAGNOSIS — R0600 Dyspnea, unspecified: Secondary | ICD-10-CM | POA: Diagnosis not present

## 2020-03-12 DIAGNOSIS — R0609 Other forms of dyspnea: Secondary | ICD-10-CM

## 2020-03-12 DIAGNOSIS — E785 Hyperlipidemia, unspecified: Secondary | ICD-10-CM

## 2020-03-17 ENCOUNTER — Other Ambulatory Visit: Payer: Self-pay

## 2020-03-17 ENCOUNTER — Encounter: Payer: Self-pay | Admitting: Internal Medicine

## 2020-03-17 ENCOUNTER — Ambulatory Visit: Payer: Medicare HMO | Admitting: Internal Medicine

## 2020-03-17 VITALS — BP 100/66 | HR 105 | Temp 96.1°F | Ht 73.0 in | Wt 206.8 lb

## 2020-03-17 DIAGNOSIS — R0600 Dyspnea, unspecified: Secondary | ICD-10-CM

## 2020-03-17 DIAGNOSIS — Z23 Encounter for immunization: Secondary | ICD-10-CM | POA: Diagnosis not present

## 2020-03-17 DIAGNOSIS — R0609 Other forms of dyspnea: Secondary | ICD-10-CM

## 2020-03-17 MED ORDER — ALBUTEROL SULFATE HFA 108 (90 BASE) MCG/ACT IN AERS: 2.0000 | INHALATION_SPRAY | Freq: Four times a day (QID) | RESPIRATORY_TRACT | 5 refills | Status: DC | PRN

## 2020-03-17 NOTE — Progress Notes (Signed)
[name]    [id number]    09-Feb-1946  [facility], Garen Lah, MD Date of Appointment: 03/17/2020 Established Patient Visit  Chief complaint:   Chief Complaint  Patient presents with   Follow-up    more sob with exertion.  Dry cough in the evenings with lying down.  Chest pressure with/without exertion.  swelling in feet/ankles, intermittent.  Somes swelling under left armpit and right jaw.  purple areas on arms on skin.       HPI: Thomas Dominguez. Thomas Dominguez is a 74 y.o. 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 Initially saw me in March 2021. Had CT Chest with peribronchovasular opacities potentially consistent with infection. Was also started on   Interval Updates: Treated with 4 weeks augmentin twice.  Felt initially better after first course, not much improvement after the second. Saw Thomas Dominguez in follow up and had repeat CT Chest. Here for follow up on this today. Was started on symbicort which seems to have helped. Still having dyspnea, chest tightness.  Does have nocturnal dry cough which is attributed to his reflux s/p esophagectomy.  No mucus production. No fevers or chills. Thinks he might have felt enlarged cervical or axillary lymph node but this resolved after a day.   I have reviewed the patient's family social and past medical history and updated as appropriate.   Past Medical History:  Diagnosis Date   Acid reflux disease 01/17/2019   Atrial fibrillation ([place]) 01/17/2019   BPH (benign prostatic hyperplasia) 01/17/2019   Degenerative disc disease, lumbar 01/17/2019   Depression, major, single episode, complete remission ([place]) 01/17/2019   Diverticulosis 01/17/2019   DVT (deep venous thrombosis) ([place]) 01/17/2019   Dysrhythmia    Esophageal cancer ([place])    GERD (gastroesophageal reflux disease)    H/O small bowel obstruction 01/17/2019   Hiatal hernia  01/17/2019   Hypertension    SBO (small bowel obstruction) ([place]) 11/20/2019    Past Surgical History:  Procedure Laterality Date   ABDOMINAL HERNIA REPAIR  2012, 2014, 2017   ventral   COLONOSCOPY  2019   ESOPHAGECTOMY  1997   for esophageal cancer   PARATHYROIDECTOMY Right 01/26/2020   Procedure: RIGHT INFERIOR PARATHYROIDECTOMY;  Surgeon: Thomas Gemma, MD;  Location: [facility];  Service: General;  Laterality: Right;   SPINAL FUSION  2018, 2020   L2 L5 in 2018, L5-S1 2020    Family History  Problem Relation Age of Onset   Emphysema Father     Social History   Occupational History   Occupation: retired    Fish farm manager: [employer]  Tobacco Use   Smoking status: Never Smoker   Smokeless tobacco: Never Used  Scientific laboratory technician Use: Never used  Substance and Sexual Activity   Alcohol use: Yes    Alcohol/week: 7.0 standard drinks    Types: 7 Cans of beer per week   Drug use: Never   Sexual activity: Not on file     Physical Exam: Blood pressure 100/66, pulse (!) 105, temperature (!) 96.1 F (35.6 C), temperature source Temporal, height 6\' 1"  (1.854 m), weight 206 lb 12.8 oz (93.8 kg), SpO2 93 %.  Gen:      No acute distress Lungs:    No increased respiratory effort, symmetric chest wall excursion, clear to auscultation bilaterally, no wheezes or crackles CV:  Regular rate and rhythm; no murmurs, rubs, or gallops.  No pedal edema   Data Reviewed: Imaging: I have personally reviewed the CT Chest in sept 2021 which shows persistence of peribronchovascular GGOs and some tree in bud opacities.   PFTs:  PFT Results Latest Ref Rng & Units 12/12/2019  FVC-Pre L 3.06  FVC-Predicted Pre % 65  FVC-Post L 3.35  FVC-Predicted Post % 71  Pre FEV1/FVC % % 74  Post FEV1/FCV % % 78  FEV1-Pre L 2.25  FEV1-Predicted Pre % 66  FEV1-Post L 2.60  DLCO uncorrected ml/min/mmHg 20.05  DLCO UNC% % 74  DLCO corrected ml/min/mmHg 20.59  DLCO COR %Predicted % 76   DLVA Predicted % 98  TLC L 6.04  TLC % Predicted % 81  RV % Predicted % 102   I have personally reviewed the patient's PFTs and consistent with no airflow limitation, mild diffusion impairment. +BD response.  Labs:  Immunization status: Immunization History  Administered Date(s) Administered   Fluad Quad(high Dose 65+) 04/23/2019, 03/17/2020   Influenza Inj Mdck Quad Pf 07/04/2017, 06/11/2018   Influenza, Quadrivalent, Recombinant, Inj, Pf 04/21/2019   Influenza-Unspecified 04/28/2019   PFIZER SARS-COV-2 Vaccination 11/26/2019, 12/17/2019    Assessment:  Shortness of breath Dextrocardia - potentially acquired from his prior surgeries.  Barrett's Esophagus s/p esophagectomy  Abnormal CT Chest  Plan/Recommendations: Continue symbicort and prn albuterol Given his persistent symptoms, Differential includes sarcoidosis or atypical infection after reviewing his CT Chest. Will plan for bronchoscopy with TBBX to eliminate atypical infection. TBBX also helpful in assessing for sarcoidosis in the absence of mediastinal and hilar adenopathy.   I spent 48 minutes on 03/17/2020 in care of this patient including face to face time and non-face to face time spent charting, review of outside records, and coordination of care.   Return to Care: Return in about 3 months (around 06/16/2020).   [name], MD [facility] [place]

## 2020-03-17 NOTE — Patient Instructions (Addendum)
We will call you to schedule bronchoscopy.   Continue symbicort. Start albuterol.   Take the albuterol rescue inhaler every 4 to 6 hours as needed for wheezing or shortness of breath.You can also take it 15 minutes before exercise or exertional activity. Side effects include heart racing or pounding, jitters or anxiety. If you have a history of an irregular heart rhythm, it can make this worse. Can also give some patients a hard time sleeping.  To inhale the aerosol using an inhaler, follow these steps:  1. Remove the protective dust cap from the end of the mouthpiece. If the dust cap was not placed on the mouthpiece, check the mouthpiece for dirt or other objects. Be sure that the canister is fully and firmly inserted in the mouthpiece. 2. If you are using the inhaler for the first time or if you have not used the inhaler in more than 14 days, you will need to prime it. You may also need to prime the inhaler if it has been dropped. Ask your pharmacist or check the manufacturer's information if this happens. To prime the inhaler, shake it well and then press down on the canister 4 times to release 4 sprays into the air, away from your face. Be careful not to get albuterol in your eyes. 3. Shake the inhaler well. 4. Breathe out as completely as possible through your mouth. 4. Hold the canister with the mouthpiece on the bottom, facing you and the canister pointing upward. Place the open end of the mouthpiece into your mouth. Close your lips tightly around the mouthpiece. 6. Breathe in slowly and deeply through the mouthpiece.At the same time, press down once on the container to spray the medication into your mouth. 7. Try to hold your breath for 10 seconds. remove the inhaler, and breathe out slowly. 8. If you were told to use 2 puffs, wait 1 minute and then repeat steps 3-7. 9. Replace the protective cap on the inhaler. 10. Clean your inhaler regularly. Follow the manufacturer's directions  carefully and ask your doctor or pharmacist if you have any questions about cleaning your inhaler.  Check the back of the inhaler to keep track of the total number of doses left on the inhaler.

## 2020-03-19 ENCOUNTER — Telehealth: Payer: Self-pay | Admitting: Internal Medicine

## 2020-03-19 NOTE — Telephone Encounter (Signed)
Patient to be scheduled for bronchoscopy with TBBX and fluoroscopy. Sent to procedure pool.

## 2020-03-22 ENCOUNTER — Ambulatory Visit (INDEPENDENT_AMBULATORY_CARE_PROVIDER_SITE_OTHER): Payer: Medicare HMO | Admitting: Family Medicine

## 2020-03-22 ENCOUNTER — Other Ambulatory Visit: Payer: Self-pay

## 2020-03-22 ENCOUNTER — Encounter: Payer: Self-pay | Admitting: Family Medicine

## 2020-03-22 VITALS — BP 118/58 | HR 83 | Ht 73.0 in | Wt 209.0 lb

## 2020-03-22 DIAGNOSIS — Z23 Encounter for immunization: Secondary | ICD-10-CM

## 2020-03-22 DIAGNOSIS — Z0000 Encounter for general adult medical examination without abnormal findings: Secondary | ICD-10-CM

## 2020-03-22 MED ORDER — TETANUS-DIPHTH-ACELL PERTUSSIS 5-2.5-18.5 LF-MCG/0.5 IM SUSP: 0.5000 mL | Freq: Once | INTRAMUSCULAR | 0 refills | Status: AC

## 2020-03-22 NOTE — Patient Instructions (Addendum)
  Mr. Thomas Dominguez , Thank you for taking time to come for your Medicare Wellness Visit. I appreciate your ongoing commitment to your health goals. Please review the following plan we discussed and let me know if I can assist you in the future.  Please schedule an appointment with me after your cardiologist visit in November.    These are the goals we discussed: Limit or eliminate your juice intake.  You can replace orange or pineapple juice with the actual fruits themselves as a healthier option.   Have a great day,   Clemetine Marker, MD

## 2020-03-22 NOTE — Progress Notes (Signed)
Subjective:   Jori Moll L. Mansel is a 74 y.o. male who presents for Medicare Annual/Subsequent preventive examination.  Patient states his dyspnea returned in the spring.  He is currently being worked up by his pulmonologist for possible sarcoidosis.    For his back pain the patient takes his tramadol approximately 3 times a week.   The patient is currently retired.  His exercise consists of daily walks and yard maintenance.  He uses a self propelled walk along mower to mow the yard.    The patient has obtained hearing aids since moving to [place].    A typical breakfast consists of eggs twice a week, cereal, r a sandwich.  Lunch is usually sandwiches, orange juice, pineapple juice.   Dinner is usually chicken.  He will have beef or pork once a week.  zuchini and potatoes.  He will eat an apple and banana daily.     Goal, weight loss.  Lack of exercise.    Review of Systems:  Positive for abdominal pain, occasional loss of balance and occasional orthostasis.  Otherwise negative.        Objective:    Vitals: BP (!) 118/58   Pulse 83   Ht 6\' 1"  (1.854 m)   Wt 209 lb (94.8 kg)   SpO2 98%   BMI 27.57 kg/m   Body mass index is 27.57 kg/m.  Gen: alert, oriented.   HEENT: moist oral mucosa.  PERRLA. EOMI CV: RRR. No murmurs Pulm: LCTAB.  GI: soft, nontender. Normal bowel sounds.  MSK: 5/5 strength b/l upper and lower extremities.  Neuro: CN 2-12 grossly intact.   Skin: no rashes.    Advanced Directives 03/22/2020 01/15/2020 11/20/2019  Does Patient Have a Medical Advance Directive? No No No  Would patient like information on creating a medical advance directive? No - Patient declined No - Patient declined Yes (ED - Information included in AVS)    Tobacco Social History   Tobacco Use  Smoking Status Never Smoker  Smokeless Tobacco Never Used     Counseling given: Not necessary   Clinical Intake:  Pre-visit preparation completed: Yes  Pain Score: 0-No pain         How often do you need to have someone help you when you read instructions, pamphlets, or other written materials from your doctor or pharmacy?: 1 - Never  Interpreter Needed?: No     Past Medical History:  Diagnosis Date  . Atrial fibrillation ([place]) 01/17/2019  . BPH (benign prostatic hyperplasia) 01/17/2019  . Degenerative disc disease, lumbar 01/17/2019  . Depression, major, single episode, complete remission ([place]) 01/17/2019  . Diverticulosis 01/17/2019  . Dysrhythmia   . Esophageal cancer ([place])   . GERD (gastroesophageal reflux disease)   . H/O small bowel obstruction 01/17/2019  . Hiatal hernia 01/17/2019  . Hypertension   . SBO (small bowel obstruction) ([place]) 11/20/2019   Past Surgical History:  Procedure Laterality Date  . ABDOMINAL HERNIA REPAIR  2012, 2014, 2017   ventral  . COLONOSCOPY  2019  . ESOPHAGECTOMY  1997   for esophageal cancer  . PARATHYROIDECTOMY Right 01/26/2020   Procedure: RIGHT INFERIOR PARATHYROIDECTOMY;  Surgeon: [name], MD;  Location: [facility];  Service: General;  Laterality: Right;  . SPINAL FUSION  2018, 2020   L2 L5 in 2018, L5-S1 2020   Family History  Problem Relation Age of Onset  . Emphysema Father        smoker  . GER disease Daughter   .  GER disease Son   . GER disease Mother   . Lung cancer Sister        never smoked   Social History   Socioeconomic History  . Marital status: Married    Spouse name: [name]  . Number of children: 2  . Years of education: Not on file  . Highest education level: Not on file  Occupational History  . Occupation: retired    Fish farm manager: [employer]  Tobacco Use  . Smoking status: Never Smoker  . Smokeless tobacco: Never Used  Vaping Use  . Vaping Use: Never used  Substance and Sexual Activity  . Alcohol use: Yes    Alcohol/week: 7.0 standard drinks    Types: 7 Cans of beer per week  . Drug use: Never  . Sexual activity: Not on file  Other Topics Concern  . Not on file  Social  History Narrative  . Not on file   Social Determinants of Health   Financial Resource Strain:   . Difficulty of Paying Living Expenses: Not on file  Food Insecurity: No Food Insecurity  . Worried About Charity fundraiser in the Last Year: Never true  . Ran Out of Food in the Last Year: Never true  Transportation Needs: No Transportation Needs  . Lack of Transportation (Medical): No  . Lack of Transportation (Non-Medical): No  Physical Activity:   . Days of Exercise per Week: Not on file  . Minutes of Exercise per Session: Not on file  Stress:   . Feeling of Stress : Not on file  Social Connections:   . Frequency of Communication with Friends and Family: Not on file  . Frequency of Social Gatherings with Friends and Family: Not on file  . Attends Religious Services: Not on file  . Active Member of Clubs or Organizations: Not on file  . Attends Archivist Meetings: Not on file  . Marital Status: Not on file    Outpatient Encounter Medications as of 03/22/2020  Medication Sig  . albuterol (VENTOLIN HFA) 108 (90 Base) MCG/ACT inhaler Inhale 2 puffs into the lungs every 6 (six) hours as needed.  Marland Kitchen aspirin EC 81 MG tablet Take 81 mg by mouth daily.  . budesonide-formoterol (SYMBICORT) 80-4.5 MCG/ACT inhaler Inhale 2 puffs into the lungs 2 (two) times daily.  Marland Kitchen diltiazem (CARDIZEM) 120 MG tablet TAKE 1 TABLET (120 MG TOTAL) BY MOUTH DAILY.  . methocarbamol (ROBAXIN) 750 MG tablet Take 1 tablet (750 mg total) by mouth 2 (two) times daily as needed for muscle spasms.  Marland Kitchen omeprazole (PRILOSEC) 40 MG capsule TAKE 1 CAPSULE TWICE DAILY  . propafenone (RYTHMOL) 225 MG tablet TAKE 1 TABLET (225 MG TOTAL) BY MOUTH EVERY 8 HOURS.  . tamsulosin (FLOMAX) 0.4 MG CAPS capsule TAKE 2 CAPSULES EVERY DAY (Patient taking differently: 0.4 mg in the morning and at bedtime. )  . [EXPIRED] Tdap (BOOSTRIX) 5-2.5-18.5 LF-MCG/0.5 injection Inject 0.5 mLs into the muscle once for 1 dose.  . traMADol  (ULTRAM) 50 MG tablet TAKE 1 TABLET BY MOUTH EVERY 12 HOURS AS NEEDED FOR MODERATE PAIN.  Marland Kitchen venlafaxine XR (EFFEXOR-XR) 150 MG 24 hr capsule TAKE 1 CAPSULE EVERY DAY WITH BREAKFAST (Patient taking differently: Take 150 mg by mouth daily with breakfast. )   No facility-administered encounter medications on file as of 03/22/2020.    Activities of Daily Living In your present state of health, do you have any difficulty performing the following activities: 01/15/2020 11/20/2019  Hearing? Darreld Mclean  N  Comment Bilateral Hearing Aids -  Vision? Y N  Difficulty concentrating or making decisions? N N  Walking or climbing stairs? N N  Dressing or bathing? N N  Doing errands, shopping? N N  Some recent data might be hidden    Patient Care Team: Benay Pike, MD as PCP - General (Family Medicine)   Assessment:   This is a routine wellness examination for [place]. Patient is generally in good health.  Was diagnosed with hyperparathyroidism earlier this year and has had successful parathyroidectomy of the adenoma with good results.  Is currently being worked up by pulmonology for dyspnea.  Sees cardiologist regularly.   Exercise Activities and Dietary recommendations    Goals    .  DIET - DECREASE SODA OR JUICE INTAKE (pt-stated)      Decrease or eliminate orange and pineapple juice.  Replace with equivalent fruit.        Fall Risk Fall Risk  11/28/2019 09/30/2019 09/02/2019 01/15/2019  Falls in the past year? 0 1 0 0  Number falls in past yr: 0 1 - -  Injury with Fall? - 0 - -  Follow up Falls evaluation completed Follow up appointment - -  Comment - MD informed - -   Is the patient's home free of loose throw rugs in walkways, pet beds, electrical cords, etc?   no      Grab bars in the bathroom? no      Handrails on the stairs?   yes      Adequate lighting?   yes   Depression Screen PHQ 2/9 Scores 03/22/2020 11/28/2019 09/30/2019 09/02/2019  PHQ - 2 Score 0 0 1 0  PHQ- 9 Score 5 - - -    Cognitive  Function        Immunization History  Administered Date(s) Administered  . Fluad Quad(high Dose 65+) 04/23/2019, 03/17/2020  . Influenza Inj Mdck Quad Pf 07/04/2017, 06/11/2018  . Influenza, Quadrivalent, Recombinant, Inj, Pf 04/21/2019  . Influenza-Unspecified 04/28/2019  . PFIZER SARS-COV-2 Vaccination 11/26/2019, 12/17/2019  . Pneumococcal Conjugate-13 03/22/2020  . Tdap 03/22/2020    Screening Tests Health Maintenance  Topic Date Due  . PNA vac Low Risk Adult (2 of 2 - PPSV23) 03/22/2021  . COLONOSCOPY  10/13/2027  . TETANUS/TDAP  03/22/2030  . INFLUENZA VACCINE  Completed  . COVID-19 Vaccine  Completed  . Hepatitis C Screening  Completed   Cancer Screenings: Lung: Low Dose CT Chest recommended if Age 70-80 years, 30 pack-year currently smoking OR have quit w/in 15years. Patient does not qualify. Colorectal: performed on 10/12/2017 by Dr. Shirlyn Goltz.  Recommended 10 year follow up.   Additional Screenings: none       Plan:    patient has goal of decreasing juice intake and dessert.  Pt will follow up with pcp after his cardiology visit in November.    I have personally reviewed and noted the following in the patient's chart:   . Medical and social history . Use of alcohol, tobacco or illicit drugs  . Current medications and supplements . Functional ability and status . Nutritional status . Physical activity . Advanced directives - declined.  Has discussed informally with wife.   . List of other physicians o Pulmonologist o Cardiologist - last went in June.  Q57months.   o Gen surg: no longer necessary o Neurosurgery:  Return as needed.     Marland Kitchen Hospitalizations, surgeries, and ER visits in previous 12 months o parathyroidectomy o Bowel  obstruction  . Vitals . Screenings to include cognitive, depression, and falls . Referrals and appointments  In addition, I have reviewed and discussed with patient certain preventive protocols, quality metrics, and best practice  recommendations. A written personalized care plan for preventive services as well as general preventive health recommendations were provided to patient.      Benay Pike, MD  03/24/2020

## 2020-03-24 ENCOUNTER — Encounter: Payer: Self-pay | Admitting: Family Medicine

## 2020-03-25 NOTE — Telephone Encounter (Signed)
email from patient after getting disconnected over phone  I received the my chart message to schedule bronchoscopy October 26-28. I was on the phone and started  to speak about scheduling when were disconnected. My breathing has gotten worse since I saw Dr. Shearon Stalls last week and I was hoping to get the bronchoscopy sooner than another 4 weeks. Dr. Shearon Stalls explained to me I can't be treated until she can make a diagnosis from the bronchoscopy test to determine what medicine to give.   Shortness of breath increasing even with rescue inhaler. More coughing. More chest tightness with coughing. Sometimes when I'm coughing I seel like I'm going to pass out.  Thank you, Thomas Dominguez   Dr. Shearon Stalls please advise.

## 2020-03-25 NOTE — Addendum Note (Signed)
Addended by: [name] on: 03/25/2020 04:11 PM   Modules accepted: Orders

## 2020-03-26 ENCOUNTER — Telehealth: Payer: Self-pay | Admitting: *Deleted

## 2020-03-26 DIAGNOSIS — J849 Interstitial pulmonary disease, unspecified: Secondary | ICD-10-CM

## 2020-03-26 NOTE — Telephone Encounter (Signed)
-----   Message from Ilona Sorrel sent at 03/26/2020  8:04 AM EDT ----- Regarding: Dr Shearon Stalls put this in under scheduled orders Order for Jb, [place]" [[id number]] Order #: [id number] Procedure: BRONCHOSCOPY    Please schedule the following: Diagnosis: Interstitial lung disease Procedure: bronchoscopy with transbronchial biospies Anesthesia: yes Do you need Fluro? yes Priority: routine Date: October 26-28 Time: anytime Location: [place] Does patient have OSA? no DM? no Or Latex allergy? no Medication Restriction: none Anticoagulate/Antiplatelet: stop aspirin if on this. Pre-op Labs Ordered: CBC, CMP, PT/INR, PTT Imaging request: none  (If, SuperDimension CT Chest, please have STAT courier sent to [place].) Please coordinate Pre-op COVID Testing

## 2020-03-26 NOTE — Telephone Encounter (Signed)
Dr Lamonte Sakai is at [facility] next week and Dr. Vaughan Browner the weekend after. I will ask if either of them can do a bronchoscopy sooner   Thank you,  ND    Dr. Shearon Stalls do you have an update on a sooner date, Patient is schedule. 04/20/20 at [place] and spoke with patient. Let them know their Bronch is scheduled for 04/20/20 with Dr. Shearon Stalls at [facility] [place].  Patient was instructed to arrive at hospital at 0600. They were instructed to bring someone with them as they will not be able to drive home from procedure. Patient instructed not to have anything to eat or drink after midnight. Patient needs to hold their blood thinner Aspirin prior to procedure.   Patient's covid screening is scheduled at [facility] location for 04/17/20 at 1000.  Patient voiced understanding, nothing further needed  Routing to ND as FYI

## 2020-03-26 NOTE — Telephone Encounter (Signed)
Spoke with patient on phone.  There are other phone messages open on patient.  Let him know I sent a message for Dr. Shearon Stalls and we are waiting her response. Closing message

## 2020-03-27 NOTE — Telephone Encounter (Signed)
Dr Lamonte Sakai can do it instead on Tuesday Wednesday or Thursday of next week if he prefers to do it then.

## 2020-03-30 ENCOUNTER — Encounter: Payer: Self-pay | Admitting: Gastroenterology

## 2020-03-30 ENCOUNTER — Ambulatory Visit (INDEPENDENT_AMBULATORY_CARE_PROVIDER_SITE_OTHER): Payer: Medicare HMO | Admitting: Gastroenterology

## 2020-03-30 VITALS — BP 100/60 | HR 68 | Ht 73.0 in | Wt 209.0 lb

## 2020-03-30 DIAGNOSIS — Z8719 Personal history of other diseases of the digestive system: Secondary | ICD-10-CM | POA: Diagnosis not present

## 2020-03-30 DIAGNOSIS — K219 Gastro-esophageal reflux disease without esophagitis: Secondary | ICD-10-CM | POA: Diagnosis not present

## 2020-03-30 DIAGNOSIS — K2270 Barrett's esophagus without dysplasia: Secondary | ICD-10-CM | POA: Diagnosis not present

## 2020-03-30 DIAGNOSIS — K449 Diaphragmatic hernia without obstruction or gangrene: Secondary | ICD-10-CM | POA: Diagnosis not present

## 2020-03-30 MED ORDER — FAMOTIDINE 20 MG PO TABS
20.0000 mg | ORAL_TABLET | Freq: Every day | ORAL | 2 refills | Status: DC
Start: 2020-03-30 — End: 2020-04-16

## 2020-03-30 NOTE — Patient Instructions (Addendum)
You have been scheduled for an endoscopy. Please follow written instructions given to you at your visit today. If you use inhalers (even only as needed), please bring them with you on the day of your procedure.   We will send Dexilant and Pepcid to your pharmacy  Use Gaviscon after meals as needed   If you are age 74 or older, your body mass index should be between 23-30. Your Body mass index is 27.57 kg/m. If this is out of the aforementioned range listed, please consider follow up with your Primary Care Provider.  If you are age 49 or younger, your body mass index should be between 19-25. Your Body mass index is 27.57 kg/m. If this is out of the aformentioned range listed, please consider follow up with your Primary Care Provider.     Gastroesophageal Reflux Disease, Adult Gastroesophageal reflux (GER) happens when acid from the stomach flows up into the tube that connects the mouth and the stomach (esophagus). Normally, food travels down the esophagus and stays in the stomach to be digested. However, when a person has GER, food and stomach acid sometimes move back up into the esophagus. If this becomes a more serious problem, the person may be diagnosed with a disease called gastroesophageal reflux disease (GERD). GERD occurs when the reflux:  Happens often.  Causes frequent or severe symptoms.  Causes problems such as damage to the esophagus. When stomach acid comes in contact with the esophagus, the acid may cause soreness (inflammation) in the esophagus. Over time, GERD may create small holes (ulcers) in the lining of the esophagus. What are the causes? This condition is caused by a problem with the muscle between the esophagus and the stomach (lower esophageal sphincter, or LES). Normally, the LES muscle closes after food passes through the esophagus to the stomach. When the LES is weakened or abnormal, it does not close properly, and that allows food and stomach acid to go back up  into the esophagus. The LES can be weakened by certain dietary substances, medicines, and medical conditions, including:  Tobacco use.  Pregnancy.  Having a hiatal hernia.  Alcohol use.  Certain foods and beverages, such as coffee, chocolate, onions, and peppermint. What increases the risk? You are more likely to develop this condition if you:  Have an increased body weight.  Have a connective tissue disorder.  Use NSAID medicines. What are the signs or symptoms? Symptoms of this condition include:  Heartburn.  Difficult or painful swallowing.  The feeling of having a lump in the throat.  Abitter taste in the mouth.  Bad breath.  Having a large amount of saliva.  Having an upset or bloated stomach.  Belching.  Chest pain. Different conditions can cause chest pain. Make sure you see your health care provider if you experience chest pain.  Shortness of breath or wheezing.  Ongoing (chronic) cough or a night-time cough.  Wearing away of tooth enamel.  Weight loss. How is this diagnosed? Your health care provider will take a medical history and perform a physical exam. To determine if you have mild or severe GERD, your health care provider may also monitor how you respond to treatment. You may also have tests, including:  A test to examine your stomach and esophagus with a small camera (endoscopy).  A test thatmeasures the acidity level in your esophagus.  A test thatmeasures how much pressure is on your esophagus.  A barium swallow or modified barium swallow test to show the shape,  size, and functioning of your esophagus. How is this treated? The goal of treatment is to help relieve your symptoms and to prevent complications. Treatment for this condition may vary depending on how severe your symptoms are. Your health care provider may recommend:  Changes to your diet.  Medicine.  Surgery. Follow these instructions at home: Eating and  drinking   Follow a diet as recommended by your health care provider. This may involve avoiding foods and drinks such as: ? Coffee and tea (with or without caffeine). ? Drinks that containalcohol. ? Energy drinks and sports drinks. ? Carbonated drinks or sodas. ? Chocolate and cocoa. ? Peppermint and mint flavorings. ? Garlic and onions. ? Horseradish. ? Spicy and acidic foods, including peppers, chili powder, curry powder, vinegar, hot sauces, and barbecue sauce. ? Citrus fruit juices and citrus fruits, such as oranges, lemons, and limes. ? Tomato-based foods, such as red sauce, chili, salsa, and pizza with red sauce. ? Fried and fatty foods, such as donuts, french fries, potato chips, and high-fat dressings. ? High-fat meats, such as hot dogs and fatty cuts of red and white meats, such as rib eye steak, sausage, ham, and bacon. ? High-fat dairy items, such as whole milk, butter, and cream cheese.  Eat small, frequent meals instead of large meals.  Avoid drinking large amounts of liquid with your meals.  Avoid eating meals during the 2-3 hours before bedtime.  Avoid lying down right after you eat.  Do not exercise right after you eat. Lifestyle   Do not use any products that contain nicotine or tobacco, such as cigarettes, e-cigarettes, and chewing tobacco. If you need help quitting, ask your health care provider.  Try to reduce your stress by using methods such as yoga or meditation. If you need help reducing stress, ask your health care provider.  If you are overweight, reduce your weight to an amount that is healthy for you. Ask your health care provider for guidance about a safe weight loss goal. General instructions  Pay attention to any changes in your symptoms.  Take over-the-counter and prescription medicines only as told by your health care provider. Do not take aspirin, ibuprofen, or other NSAIDs unless your health care provider told you to do so.  Wear  loose-fitting clothing. Do not wear anything tight around your waist that causes pressure on your abdomen.  Raise (elevate) the head of your bed about 6 inches (15 cm).  Avoid bending over if this makes your symptoms worse.  Keep all follow-up visits as told by your health care provider. This is important. Contact a health care provider if:  You have: ? New symptoms. ? Unexplained weight loss. ? Difficulty swallowing or it hurts to swallow. ? Wheezing or a persistent cough. ? A hoarse voice.  Your symptoms do not improve with treatment. Get help right away if you:  Have pain in your arms, neck, jaw, teeth, or back.  Feel sweaty, dizzy, or light-headed.  Have chest pain or shortness of breath.  Vomit and your vomit looks like blood or coffee grounds.  Faint.  Have stool that is bloody or black.  Cannot swallow, drink, or eat. Summary  Gastroesophageal reflux happens when acid from the stomach flows up into the esophagus. GERD is a disease in which the reflux happens often, causes frequent or severe symptoms, or causes problems such as damage to the esophagus.  Treatment for this condition may vary depending on how severe your symptoms are. Your health care  provider may recommend diet and lifestyle changes, medicine, or surgery.  Contact a health care provider if you have new or worsening symptoms.  Take over-the-counter and prescription medicines only as told by your health care provider. Do not take aspirin, ibuprofen, or other NSAIDs unless your health care provider told you to do so.  Keep all follow-up visits as told by your health care provider. This is important. This information is not intended to replace advice given to you by your health care provider. Make sure you discuss any questions you have with your health care provider. Document Revised: 12/19/2017 Document Reviewed: 12/19/2017 Elsevier Patient Education  [place].   I appreciate the   opportunity to care for you  Thank You   Harl Bowie , MD

## 2020-03-30 NOTE — Telephone Encounter (Signed)
ATC patient unable to leave voicemail phone rang 15 times will try again later

## 2020-03-30 NOTE — Telephone Encounter (Signed)
Did you guys try to reach this pt?

## 2020-03-30 NOTE — Progress Notes (Signed)
[place]    [id number]    04/10/1946  Primary Care Physician:Olson, Garen Lah, MD  Referring Physician: Benay Pike, MD 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  Denies any dysphagia or odynophagia.  He was hospitalized with small bowel obstruction in May 2021, spontaneously resolved with bowel rest and NG tube suction.  This was fifth hospitalization in the past few years with SBO. His bowel habits are regular, he is taking Benefiber/operative fiber daily.  Denies any constipation, diarrhea, melena or blood per rectum.  He is currently undergoing pulmonary work-up for abnormal CT chest, is scheduled to undergo bronchoscopy later this month  CT cardiac coronary March 12, 2020: Cardiac coronary calcium score 0  CT chest high-resolution March 12, 2020: Widespread areas of thickening of peribronchovascular interstitium with architectural distortion including nodular and masslike opacities, surrounding groundglass attenuation and micronodularity stable compared to March 2021.   CT abdomen pelvis Nov 20, 2019: Mid small bowel obstruction likely due to adhesions.  Airspace disease in bilateral lungs worrisome for aspiration  04/02/2019: LVEF 55-60%  Colonoscopy 10/03/2017 Dr. Shirlyn Goltz (f/u diverticulitis)- severe pancolonic diverticulosis, tortuosity & spasm  EGD 03/2015 Dr. Shirlyn Goltz- changes of prior esophagectomy and gastric pull-through, normal mucosa throughout  EGD 03/2011 Dr. Shirlyn Goltz- surgically shortened anastomosis at 21 cm, no Barrett's changes seen. Fair amount of solid food  retention. Gastric food retention obscuring pylorus. Very angulated atrium without stenosis, probably contributing to emptying difficulties. Because of coughing difficulties did not pursue duodenal intubation  EGD 10/2010 Dr. Shirlyn Goltz- gastric food retention obscuring pylorus  CLN 09/12/2012 Dr. Shirlyn Goltz- pancolonic diverticulosis, less than ideal prep, internal hemorrhoids  CLN 2004- severe left-sided diverticulosis  RECENT GI IMAGING STUDIES:  AXR 05/2018- no free air identified, no dilated bowel loops  CTAP 05/2018 SMALL BOWEL OBSTRUCTION. TRANSITION POINT IS NEAR A SMALL BOWEL ANASTOMOSIS IN THE DISTAL SMALL BOWEL CENTRAL ABDOMEN IN THE REGION OF THE CENTRAL MESENTERY. EXACT TRANSITION POINT IS DIFFICULT TO IDENTIFY. EFFUSIONS CANNOT BE EXCLUDED. NO DEFINITIVE FOCAL MASS. NO ADDITIONAL SIGNIFICANT CHANGE.  PET scan 07/2017 NO EVIDENCE OF HYPERMETABOLIC MALIGNANCY IN THE CHEST, ABDOMEN OR PELVIS. HYPERMETABOLIC ACTIVITY NEAR THE SMALL BOWEL ANASTOMOSIS HAS THE APPEARANCE OF PHYSIOLOGIC INTRALUMINAL BOWEL ACTIVITY WITH NO PERCEIVED MASS SEEN ON TODAY'S EXAM  PREVIOUS ABDOMINAL SURGERIES: Ventral hernia repair, paraesophageal hernia repair (2013), esophagectomy (1997)  CANCER HISTORY: Esophageal cancer s/p esophagectomy 1997 at [facility]. No family history of colon cancer.  Barium swallow 08/28/96 Post op anastomotic stricture s/p esophagogastrectomy. 21mm barium tablet did not traverse.  Outpatient Encounter Medications as of 03/30/2020  Medication Sig  . albuterol (VENTOLIN HFA) 108 (90 Base) MCG/ACT inhaler Inhale 2 puffs into the lungs every 6 (six) hours as needed.  Marland Kitchen aspirin EC 81 MG tablet Take 81 mg by mouth daily.  . budesonide-formoterol (SYMBICORT) 80-4.5 MCG/ACT inhaler Inhale 2 puffs into the lungs 2 (two) times daily.  Marland Kitchen diltiazem (CARDIZEM) 120 MG tablet TAKE 1 TABLET (120 MG TOTAL) BY MOUTH DAILY.  . methocarbamol (ROBAXIN) 750 MG tablet Take 1 tablet (750 mg total) by mouth  2 (two) times daily as needed for muscle spasms.  Marland Kitchen  omeprazole (PRILOSEC) 40 MG capsule TAKE 1 CAPSULE TWICE DAILY  . propafenone (RYTHMOL) 225 MG tablet TAKE 1 TABLET (225 MG TOTAL) BY MOUTH EVERY 8 HOURS.  . tamsulosin (FLOMAX) 0.4 MG CAPS capsule TAKE 2 CAPSULES EVERY DAY (Patient taking differently: 0.4 mg in the morning and at bedtime. )  . traMADol (ULTRAM) 50 MG tablet TAKE 1 TABLET BY MOUTH EVERY 12 HOURS AS NEEDED FOR MODERATE PAIN.  Marland Kitchen venlafaxine XR (EFFEXOR-XR) 150 MG 24 hr capsule TAKE 1 CAPSULE EVERY DAY WITH BREAKFAST (Patient taking differently: Take 150 mg by mouth daily with breakfast. )   No facility-administered encounter medications on file as of 03/30/2020.    Allergies as of 03/30/2020 - Review Complete 03/30/2020  Allergen Reaction Noted  . Mobic [meloxicam] Other (See Comments) 01/17/2019  . Statins Other (See Comments) 01/17/2019    Past Medical History:  Diagnosis Date  . Atrial fibrillation ([place]) 01/17/2019  . BPH (benign prostatic hyperplasia) 01/17/2019  . Degenerative disc disease, lumbar 01/17/2019  . Depression, major, single episode, complete remission ([place]) 01/17/2019  . Diverticulosis 01/17/2019  . Dysrhythmia   . Esophageal cancer ([place])   . GERD (gastroesophageal reflux disease)   . H/O small bowel obstruction 01/17/2019  . Hiatal hernia 01/17/2019  . Hypertension   . SBO (small bowel obstruction) ([place]) 11/20/2019    Past Surgical History:  Procedure Laterality Date  . ABDOMINAL HERNIA REPAIR  2012, 2014, 2017   ventral  . COLONOSCOPY  2019  . ESOPHAGECTOMY  1997   for esophageal cancer  . PARATHYROIDECTOMY Right 01/26/2020   Procedure: RIGHT INFERIOR PARATHYROIDECTOMY;  Surgeon: [name], MD;  Location: [facility];  Service: General;  Laterality: Right;  . SPINAL FUSION  2018, 2020   L2 L5 in 2018, L5-S1 2020    Family History  Problem Relation Age of Onset  . Emphysema Father        smoker  . GER disease Daughter   . GER disease Son   .  GER disease Mother   . Lung cancer Sister        never smoked    Social History   Socioeconomic History  . Marital status: Married    Spouse name: [name]  . Number of children: 2  . Years of education: Not on file  . Highest education level: Not on file  Occupational History  . Occupation: retired    Fish farm manager: [employer]  Tobacco Use  . Smoking status: Never Smoker  . Smokeless tobacco: Never Used  Vaping Use  . Vaping Use: Never used  Substance and Sexual Activity  . Alcohol use: Yes    Alcohol/week: 7.0 standard drinks    Types: 7 Cans of beer per week  . Drug use: Never  . Sexual activity: Not on file  Other Topics Concern  . Not on file  Social History Narrative  . Not on file   Social Determinants of Health   Financial Resource Strain:   . Difficulty of Paying Living Expenses: Not on file  Food Insecurity: No Food Insecurity  . Worried About Charity fundraiser in the Last Year: Never true  . Ran Out of Food in the Last Year: Never true  Transportation Needs: No Transportation Needs  . Lack of Transportation (Medical): No  . Lack of Transportation (Non-Medical): No  Physical Activity:   . Days of Exercise per Week: Not on file  . Minutes of Exercise per Session: Not on file  Stress:   .  Feeling of Stress : Not on file  Social Connections:   . Frequency of Communication with Friends and Family: Not on file  . Frequency of Social Gatherings with Friends and Family: Not on file  . Attends Religious Services: Not on file  . Active Member of Clubs or Organizations: Not on file  . Attends Archivist Meetings: Not on file  . Marital Status: Not on file  Intimate Partner Violence:   . Fear of Current or Ex-Partner: Not on file  . Emotionally Abused: Not on file  . Physically Abused: Not on file  . Sexually Abused: Not on file      Review of systems: All other review of systems negative except as mentioned in the HPI.   Physical  Exam: Vitals:   03/30/20 1000  BP: 100/60  Pulse: 68   Body mass index is 27.57 kg/m. Gen:      No acute distress HEENT:  sclera anicteric Neuro: alert and oriented x 3 Psych: normal mood and affect  Data Reviewed:  Reviewed labs, radiology imaging, old records and pertinent past GI work up   Assessment and Plan/Recommendations:  74 year old very pleasant gentleman with history of chronic GERD, Barrett's esophagus, esophageal carcinoma s/p resection with gastric pull-through, hiatal and ventral hernia repair with recurrent hernia involving colon and pancreas through the postoperative hiatus, recurrent small bowel obstructions requiring hospitalizations secondary to adhesions  GERD: Persistent breakthrough reflux symptoms despite twice daily omeprazole Switch to Dexilant 60 mg daily, patient was provided samples, he will call us back to let us know if symptoms are improving Add Pepcid 20 mg daily at bedtime as needed Gaviscon after meals as needed for postprandial breakthrough reflux symptoms  Bilateral lung groundglass opacities possibly secondary to chronic aspiration, is currently undergoing work-up by pulmonary and is scheduled to undergo bronchoscopy  Discussed antireflux measures in detail with patient  History of esophageal cancer and Barrett's esophagus, last EGD 2016.  Will schedule for surveillance EGD The risks and benefits as well as alternatives of endoscopic procedure(s) have been discussed and reviewed. All questions answered. The patient agrees to proceed.  History of diverticulitis April 2020, he has pancolonic diverticulosis.  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 The patient was provided an opportunity to ask questions and all were answered. The patient agreed with the plan and demonstrated an understanding of the instructions.  Damaris Hippo , MD    CC: Benay Pike, MD

## 2020-03-30 NOTE — Telephone Encounter (Signed)
Looks like it was Ander Purpura I will forward this to her

## 2020-04-02 ENCOUNTER — Other Ambulatory Visit: Payer: Medicare HMO

## 2020-04-02 LAB — COMPREHENSIVE METABOLIC PANEL
ALT: 18 U/L (ref 0–53)
AST: 16 U/L (ref 0–37)
Albumin: 4 g/dL (ref 3.5–5.2)
Alkaline Phosphatase: 120 U/L — ABNORMAL HIGH (ref 39–117)
BUN: 11 mg/dL (ref 6–23)
CO2: 30 mEq/L (ref 19–32)
Calcium: 9.2 mg/dL (ref 8.4–10.5)
Chloride: 104 mEq/L (ref 96–112)
Creatinine, Ser: 1.03 mg/dL (ref 0.40–1.50)
GFR: 71.16 mL/min (ref >60.00)
Glucose, Bld: 92 mg/dL (ref 70–99)
Potassium: 4.3 mEq/L (ref 3.5–5.1)
Sodium: 139 mEq/L (ref 135–145)
Total Bilirubin: 0.3 mg/dL (ref 0.2–1.2)
Total Protein: 6.9 g/dL (ref 6.0–8.3)

## 2020-04-02 LAB — CBC WITH DIFFERENTIAL/PLATELET
Basophils Absolute: 0.1 10*3/uL (ref 0.0–0.1)
Basophils Relative: 2.4 % (ref 0.0–3.0)
Eosinophils Absolute: 0.2 10*3/uL (ref 0.0–0.7)
Eosinophils Relative: 3.5 % (ref 0.0–5.0)
HCT: 44.3 % (ref 39.0–52.0)
Hemoglobin: 14.8 g/dL (ref 13.0–17.0)
Lymphocytes Relative: 24.1 % (ref 12.0–46.0)
Lymphs Abs: 1.4 10*3/uL (ref 0.7–4.0)
MCHC: 33.5 g/dL (ref 30.0–36.0)
MCV: 98.6 fl (ref 78.0–100.0)
Monocytes Absolute: 0.7 10*3/uL (ref 0.1–1.0)
Monocytes Relative: 11.8 % (ref 3.0–12.0)
Neutro Abs: 3.5 10*3/uL (ref 1.4–7.7)
Neutrophils Relative %: 58.2 % (ref 43.0–77.0)
Platelets: 210 10*3/uL (ref 150.0–400.0)
RBC: 4.49 Mil/uL (ref 4.22–5.81)
RDW: 13.7 % (ref 11.5–15.5)
WBC: 6 10*3/uL (ref 4.0–10.5)

## 2020-04-02 LAB — PROTIME-INR
INR: 0.9 ratio (ref 0.8–1.0)
Prothrombin Time: 10.5 s (ref 9.6–13.1)

## 2020-04-02 LAB — APTT: aPTT: 29.8 s (ref 23.4–32.7)

## 2020-04-02 NOTE — Telephone Encounter (Signed)
Called and spoke with patient. Let them know their Bronch is scheduled for 04/08/20 with Dr. Vaughan Browner at [facility] at 1115.  Patient was instructed to arrive at hospital at 0930. They were instructed to bring someone with them as they will not be able to drive home from procedure. Patient instructed not to have anything to eat or drink after midnight. Patient needs to hold their blood thinner Aspriin prior to procedure.   Patient's covid screening is scheduled at [facility] location for 04/05/20 at 1000.  Patient voiced understanding, nothing further needed  Routing to Dr. Vaughan Browner and Dr. Shearon Stalls as Juluis Rainier

## 2020-04-05 ENCOUNTER — Encounter (HOSPITAL_COMMUNITY): Payer: Self-pay | Admitting: Pulmonary Disease

## 2020-04-05 ENCOUNTER — Other Ambulatory Visit: Payer: Self-pay

## 2020-04-05 ENCOUNTER — Other Ambulatory Visit (HOSPITAL_COMMUNITY)
Admission: RE | Admit: 2020-04-05 | Discharge: 2020-04-05 | Disposition: A | Discharge status: Home / Self Care | Payer: Medicare HMO | Source: Ambulatory Visit | Attending: Pulmonary Disease | Admitting: Pulmonary Disease

## 2020-04-05 DIAGNOSIS — Z01812 Encounter for preprocedural laboratory examination: Secondary | ICD-10-CM | POA: Insufficient documentation

## 2020-04-05 DIAGNOSIS — J939 Pneumothorax, unspecified: Secondary | ICD-10-CM | POA: Diagnosis not present

## 2020-04-05 DIAGNOSIS — I517 Cardiomegaly: Secondary | ICD-10-CM | POA: Diagnosis not present

## 2020-04-05 DIAGNOSIS — N400 Enlarged prostate without lower urinary tract symptoms: Secondary | ICD-10-CM | POA: Diagnosis present

## 2020-04-05 DIAGNOSIS — Z8501 Personal history of malignant neoplasm of esophagus: Secondary | ICD-10-CM | POA: Diagnosis not present

## 2020-04-05 DIAGNOSIS — Z981 Arthrodesis status: Secondary | ICD-10-CM | POA: Diagnosis not present

## 2020-04-05 DIAGNOSIS — Z4682 Encounter for fitting and adjustment of non-vascular catheter: Secondary | ICD-10-CM | POA: Diagnosis not present

## 2020-04-05 DIAGNOSIS — Z20822 Contact with and (suspected) exposure to covid-19: Secondary | ICD-10-CM | POA: Insufficient documentation

## 2020-04-05 DIAGNOSIS — J95811 Postprocedural pneumothorax: Secondary | ICD-10-CM | POA: Diagnosis present

## 2020-04-05 DIAGNOSIS — J90 Pleural effusion, not elsewhere classified: Secondary | ICD-10-CM | POA: Diagnosis not present

## 2020-04-05 DIAGNOSIS — Z825 Family history of asthma and other chronic lower respiratory diseases: Secondary | ICD-10-CM | POA: Diagnosis not present

## 2020-04-05 DIAGNOSIS — J9811 Atelectasis: Secondary | ICD-10-CM | POA: Diagnosis not present

## 2020-04-05 DIAGNOSIS — Z9889 Other specified postprocedural states: Secondary | ICD-10-CM | POA: Diagnosis not present

## 2020-04-05 DIAGNOSIS — I10 Essential (primary) hypertension: Secondary | ICD-10-CM | POA: Diagnosis present

## 2020-04-05 DIAGNOSIS — K449 Diaphragmatic hernia without obstruction or gangrene: Secondary | ICD-10-CM | POA: Diagnosis not present

## 2020-04-05 DIAGNOSIS — Z79899 Other long term (current) drug therapy: Secondary | ICD-10-CM | POA: Diagnosis not present

## 2020-04-05 DIAGNOSIS — D869 Sarcoidosis, unspecified: Secondary | ICD-10-CM | POA: Diagnosis not present

## 2020-04-05 DIAGNOSIS — J849 Interstitial pulmonary disease, unspecified: Secondary | ICD-10-CM | POA: Diagnosis not present

## 2020-04-05 DIAGNOSIS — J439 Emphysema, unspecified: Secondary | ICD-10-CM | POA: Diagnosis not present

## 2020-04-05 DIAGNOSIS — Z7982 Long term (current) use of aspirin: Secondary | ICD-10-CM | POA: Diagnosis not present

## 2020-04-05 DIAGNOSIS — I4891 Unspecified atrial fibrillation: Secondary | ICD-10-CM | POA: Diagnosis present

## 2020-04-05 DIAGNOSIS — J9383 Other pneumothorax: Secondary | ICD-10-CM | POA: Diagnosis not present

## 2020-04-05 DIAGNOSIS — J8410 Pulmonary fibrosis, unspecified: Secondary | ICD-10-CM | POA: Diagnosis not present

## 2020-04-05 DIAGNOSIS — Z9689 Presence of other specified functional implants: Secondary | ICD-10-CM | POA: Diagnosis not present

## 2020-04-05 DIAGNOSIS — R918 Other nonspecific abnormal finding of lung field: Secondary | ICD-10-CM | POA: Diagnosis present

## 2020-04-05 DIAGNOSIS — Z7951 Long term (current) use of inhaled steroids: Secondary | ICD-10-CM | POA: Diagnosis not present

## 2020-04-05 DIAGNOSIS — R911 Solitary pulmonary nodule: Secondary | ICD-10-CM | POA: Diagnosis not present

## 2020-04-05 DIAGNOSIS — Z888 Allergy status to other drugs, medicaments and biological substances status: Secondary | ICD-10-CM | POA: Diagnosis not present

## 2020-04-05 DIAGNOSIS — K219 Gastro-esophageal reflux disease without esophagitis: Secondary | ICD-10-CM | POA: Diagnosis present

## 2020-04-05 LAB — SARS CORONAVIRUS 2 (TAT 6-24 HRS): SARS Coronavirus 2: NEGATIVE

## 2020-04-05 NOTE — Progress Notes (Signed)
Attempted to obtain medical history via telephone, unable to reach at this time. I left a voicemail to return pre surgical testing department's phone call.  

## 2020-04-06 NOTE — Telephone Encounter (Signed)
See previous note, procedure moved up

## 2020-04-08 ENCOUNTER — Ambulatory Visit (HOSPITAL_COMMUNITY): Payer: Medicare HMO | Admitting: Certified Registered Nurse Anesthetist

## 2020-04-08 ENCOUNTER — Inpatient Hospital Stay (HOSPITAL_COMMUNITY)
Admission: RE | Admit: 2020-04-08 | Discharge: 2020-04-09 | DRG: 201 | Disposition: A | Discharge status: Home / Self Care | Payer: Medicare HMO | Attending: Pulmonary Disease | Admitting: Pulmonary Disease

## 2020-04-08 ENCOUNTER — Encounter (HOSPITAL_COMMUNITY)
Admission: RE | Disposition: A | Discharge status: Home / Self Care | Payer: Self-pay | Source: Home / Self Care | Attending: Pulmonary Disease

## 2020-04-08 ENCOUNTER — Inpatient Hospital Stay (HOSPITAL_COMMUNITY): Payer: Medicare HMO

## 2020-04-08 ENCOUNTER — Encounter (HOSPITAL_COMMUNITY): Payer: Self-pay | Admitting: Pulmonary Disease

## 2020-04-08 ENCOUNTER — Ambulatory Visit (HOSPITAL_COMMUNITY): Payer: Medicare HMO

## 2020-04-08 DIAGNOSIS — I4891 Unspecified atrial fibrillation: Secondary | ICD-10-CM | POA: Diagnosis present

## 2020-04-08 DIAGNOSIS — J9383 Other pneumothorax: Secondary | ICD-10-CM | POA: Diagnosis not present

## 2020-04-08 DIAGNOSIS — Z981 Arthrodesis status: Secondary | ICD-10-CM | POA: Diagnosis not present

## 2020-04-08 DIAGNOSIS — Z8501 Personal history of malignant neoplasm of esophagus: Secondary | ICD-10-CM

## 2020-04-08 DIAGNOSIS — J939 Pneumothorax, unspecified: Secondary | ICD-10-CM

## 2020-04-08 DIAGNOSIS — Z79899 Other long term (current) drug therapy: Secondary | ICD-10-CM | POA: Diagnosis not present

## 2020-04-08 DIAGNOSIS — Z825 Family history of asthma and other chronic lower respiratory diseases: Secondary | ICD-10-CM | POA: Diagnosis not present

## 2020-04-08 DIAGNOSIS — D869 Sarcoidosis, unspecified: Secondary | ICD-10-CM | POA: Diagnosis not present

## 2020-04-08 DIAGNOSIS — Z7982 Long term (current) use of aspirin: Secondary | ICD-10-CM | POA: Diagnosis not present

## 2020-04-08 DIAGNOSIS — J9811 Atelectasis: Secondary | ICD-10-CM | POA: Diagnosis not present

## 2020-04-08 DIAGNOSIS — R918 Other nonspecific abnormal finding of lung field: Secondary | ICD-10-CM | POA: Diagnosis present

## 2020-04-08 DIAGNOSIS — Z888 Allergy status to other drugs, medicaments and biological substances status: Secondary | ICD-10-CM | POA: Diagnosis not present

## 2020-04-08 DIAGNOSIS — K449 Diaphragmatic hernia without obstruction or gangrene: Secondary | ICD-10-CM | POA: Diagnosis not present

## 2020-04-08 DIAGNOSIS — J95811 Postprocedural pneumothorax: Principal | ICD-10-CM | POA: Diagnosis present

## 2020-04-08 DIAGNOSIS — Z9889 Other specified postprocedural states: Secondary | ICD-10-CM

## 2020-04-08 DIAGNOSIS — J849 Interstitial pulmonary disease, unspecified: Secondary | ICD-10-CM | POA: Diagnosis not present

## 2020-04-08 DIAGNOSIS — Z7951 Long term (current) use of inhaled steroids: Secondary | ICD-10-CM

## 2020-04-08 DIAGNOSIS — K219 Gastro-esophageal reflux disease without esophagitis: Secondary | ICD-10-CM | POA: Diagnosis present

## 2020-04-08 DIAGNOSIS — Z20822 Contact with and (suspected) exposure to covid-19: Secondary | ICD-10-CM | POA: Diagnosis present

## 2020-04-08 DIAGNOSIS — Z9689 Presence of other specified functional implants: Secondary | ICD-10-CM

## 2020-04-08 DIAGNOSIS — R911 Solitary pulmonary nodule: Secondary | ICD-10-CM

## 2020-04-08 DIAGNOSIS — N400 Enlarged prostate without lower urinary tract symptoms: Secondary | ICD-10-CM | POA: Diagnosis present

## 2020-04-08 DIAGNOSIS — I10 Essential (primary) hypertension: Secondary | ICD-10-CM | POA: Diagnosis present

## 2020-04-08 HISTORY — DX: Presence of spectacles and contact lenses: Z97.3

## 2020-04-08 HISTORY — DX: Pneumothorax, unspecified: J93.9

## 2020-04-08 HISTORY — PX: BRONCHIAL WASHINGS: SHX5105

## 2020-04-08 HISTORY — PX: BRONCHIAL BIOPSY: SHX5109

## 2020-04-08 HISTORY — PX: BRONCHIAL BRUSHINGS: SHX5108

## 2020-04-08 HISTORY — DX: Other pneumothorax: J93.83

## 2020-04-08 HISTORY — PX: VIDEO BRONCHOSCOPY: SHX5072

## 2020-04-08 LAB — CBC
HCT: 44.9 % (ref 39.0–52.0)
Hemoglobin: 15.2 g/dL (ref 13.0–17.0)
MCH: 33 pg (ref 26.0–34.0)
MCHC: 33.9 g/dL (ref 30.0–36.0)
MCV: 97.6 fL (ref 80.0–100.0)
Platelets: 201 10*3/uL (ref 150–400)
RBC: 4.6 MIL/uL (ref 4.22–5.81)
RDW: 13 % (ref 11.5–15.5)
WBC: 8.6 10*3/uL (ref 4.0–10.5)
nRBC: 0 % (ref 0.0–0.2)

## 2020-04-08 LAB — BODY FLUID CELL COUNT WITH DIFFERENTIAL
Lymphs, Fluid: 41 %
Monocyte-Macrophage-Serous Fluid: 57 % (ref 50–90)
Neutrophil Count, Fluid: 2 % (ref 0–25)
Total Nucleated Cell Count, Fluid: 85 cu mm (ref 0–1000)

## 2020-04-08 LAB — MRSA PCR SCREENING: MRSA by PCR: NEGATIVE

## 2020-04-08 LAB — PNEUMOCYSTIS JIROVECI SMEAR BY DFA: Pneumocystis jiroveci Ag: NEGATIVE

## 2020-04-08 LAB — CREATININE, SERUM
Creatinine, Ser: 1.09 mg/dL (ref 0.61–1.24)
GFR, Estimated: >60 mL/min (ref >60)

## 2020-04-08 SURGERY — BRONCHOSCOPY, WITH FLUOROSCOPY
Anesthesia: General

## 2020-04-08 MED ORDER — PHENYLEPHRINE 40 MCG/ML (10ML) SYRINGE FOR IV PUSH (FOR BLOOD PRESSURE SUPPORT)
PREFILLED_SYRINGE | INTRAVENOUS | Status: DC | PRN
  Administered 2020-04-08 (×4): 120 ug via INTRAVENOUS

## 2020-04-08 MED ORDER — ENOXAPARIN SODIUM 40 MG/0.4ML ~~LOC~~ SOLN
40.0000 mg | Freq: Every day | SUBCUTANEOUS | Status: DC
  Administered 2020-04-08: 40 mg via SUBCUTANEOUS
  Filled 2020-04-08: qty 0.4

## 2020-04-08 MED ORDER — ALBUTEROL SULFATE (2.5 MG/3ML) 0.083% IN NEBU
INHALATION_SOLUTION | RESPIRATORY_TRACT | Status: AC
  Filled 2020-04-08: qty 3

## 2020-04-08 MED ORDER — LIDOCAINE HCL 2 % IJ SOLN
INTRAMUSCULAR | Status: AC
  Filled 2020-04-08: qty 20

## 2020-04-08 MED ORDER — LIDOCAINE HCL 1 % IJ SOLN
INTRAMUSCULAR | Status: AC
  Filled 2020-04-08: qty 1

## 2020-04-08 MED ORDER — LACTATED RINGERS IV SOLN: INTRAVENOUS | Status: DC

## 2020-04-08 MED ORDER — FENTANYL CITRATE (PF) 100 MCG/2ML IJ SOLN
INTRAMUSCULAR | Status: AC
Start: 2020-04-08 — End: ?
  Filled 2020-04-08: qty 2

## 2020-04-08 MED ORDER — DILTIAZEM HCL 60 MG PO TABS
120.0000 mg | ORAL_TABLET | Freq: Every day | ORAL | Status: DC
  Administered 2020-04-08 – 2020-04-09 (×2): 120 mg via ORAL
  Filled 2020-04-08 (×2): qty 2

## 2020-04-08 MED ORDER — ROCURONIUM BROMIDE 10 MG/ML (PF) SYRINGE
PREFILLED_SYRINGE | INTRAVENOUS | Status: DC | PRN
  Administered 2020-04-08: 30 mg via INTRAVENOUS
  Administered 2020-04-08: 20 mg via INTRAVENOUS

## 2020-04-08 MED ORDER — POLYETHYLENE GLYCOL 3350 17 G PO PACK: 17.0000 g | PACK | Freq: Every day | ORAL | Status: DC | PRN

## 2020-04-08 MED ORDER — PROPOFOL 10 MG/ML IV BOLUS
INTRAVENOUS | Status: AC
  Filled 2020-04-08: qty 20

## 2020-04-08 MED ORDER — CHLORHEXIDINE GLUCONATE CLOTH 2 % EX PADS
6.0000 | MEDICATED_PAD | Freq: Every day | CUTANEOUS | Status: DC
  Administered 2020-04-08: 6 via TOPICAL

## 2020-04-08 MED ORDER — ACETAMINOPHEN 325 MG PO TABS
650.0000 mg | ORAL_TABLET | ORAL | Status: DC | PRN
  Administered 2020-04-08 – 2020-04-09 (×2): 650 mg via ORAL
  Filled 2020-04-08 (×2): qty 2

## 2020-04-08 MED ORDER — IBUPROFEN 200 MG PO TABS
600.0000 mg | ORAL_TABLET | ORAL | Status: DC | PRN
  Administered 2020-04-08 – 2020-04-09 (×3): 600 mg via ORAL
  Filled 2020-04-08 (×3): qty 3

## 2020-04-08 MED ORDER — FENTANYL CITRATE (PF) 100 MCG/2ML IJ SOLN
INTRAMUSCULAR | Status: DC | PRN
Start: 2020-04-08 — End: 2020-04-08
  Administered 2020-04-08 (×2): 50 ug via INTRAVENOUS

## 2020-04-08 MED ORDER — PANTOPRAZOLE SODIUM 40 MG PO TBEC
40.0000 mg | DELAYED_RELEASE_TABLET | Freq: Two times a day (BID) | ORAL | Status: DC
  Administered 2020-04-08 – 2020-04-09 (×2): 40 mg via ORAL
  Filled 2020-04-08 (×2): qty 1

## 2020-04-08 MED ORDER — LIDOCAINE 2% (20 MG/ML) 5 ML SYRINGE
INTRAMUSCULAR | Status: DC | PRN
  Administered 2020-04-08: 100 mg via INTRAVENOUS

## 2020-04-08 MED ORDER — MIDAZOLAM HCL 5 MG/5ML IJ SOLN
INTRAMUSCULAR | Status: DC | PRN
  Administered 2020-04-08: 2 mg via INTRAVENOUS

## 2020-04-08 MED ORDER — MORPHINE SULFATE (PF) 2 MG/ML IV SOLN
INTRAVENOUS | Status: AC
  Administered 2020-04-08: 2 mg via INTRAVENOUS
  Filled 2020-04-08: qty 1

## 2020-04-08 MED ORDER — DOCUSATE SODIUM 100 MG PO CAPS: 100.0000 mg | ORAL_CAPSULE | Freq: Two times a day (BID) | ORAL | Status: DC | PRN

## 2020-04-08 MED ORDER — HYDROMORPHONE HCL 1 MG/ML IJ SOLN
1.0000 mg | Freq: Once | INTRAMUSCULAR | Status: AC
  Administered 2020-04-08: 1 mg via INTRAVENOUS
  Filled 2020-04-08: qty 1

## 2020-04-08 MED ORDER — DEXAMETHASONE SODIUM PHOSPHATE 4 MG/ML IJ SOLN
INTRAMUSCULAR | Status: DC | PRN
  Administered 2020-04-08: 5 mg via INTRAVENOUS

## 2020-04-08 MED ORDER — PROPOFOL 500 MG/50ML IV EMUL
INTRAVENOUS | Status: DC | PRN
  Administered 2020-04-08: 125 ug/kg/min via INTRAVENOUS

## 2020-04-08 MED ORDER — SUGAMMADEX SODIUM 200 MG/2ML IV SOLN
INTRAVENOUS | Status: DC | PRN
  Administered 2020-04-08: 300 mg via INTRAVENOUS

## 2020-04-08 MED ORDER — SUCCINYLCHOLINE CHLORIDE 200 MG/10ML IV SOSY
PREFILLED_SYRINGE | INTRAVENOUS | Status: DC | PRN
  Administered 2020-04-08: 120 mg via INTRAVENOUS

## 2020-04-08 MED ORDER — TAMSULOSIN HCL 0.4 MG PO CAPS
0.4000 mg | ORAL_CAPSULE | Freq: Every day | ORAL | Status: DC
  Administered 2020-04-08 – 2020-04-09 (×2): 0.4 mg via ORAL
  Filled 2020-04-08 (×3): qty 1

## 2020-04-08 MED ORDER — PROPAFENONE HCL 225 MG PO TABS
225.0000 mg | ORAL_TABLET | Freq: Three times a day (TID) | ORAL | Status: DC
  Administered 2020-04-08 – 2020-04-09 (×4): 225 mg via ORAL
  Filled 2020-04-08 (×4): qty 1

## 2020-04-08 MED ORDER — ONDANSETRON HCL 4 MG/2ML IJ SOLN
INTRAMUSCULAR | Status: DC | PRN
  Administered 2020-04-08: 4 mg via INTRAVENOUS

## 2020-04-08 MED ORDER — FAMOTIDINE 20 MG PO TABS
20.0000 mg | ORAL_TABLET | Freq: Every day | ORAL | Status: DC
  Administered 2020-04-08: 20 mg via ORAL
  Filled 2020-04-08: qty 1

## 2020-04-08 MED ORDER — EPHEDRINE SULFATE-NACL 50-0.9 MG/10ML-% IV SOSY
PREFILLED_SYRINGE | INTRAVENOUS | Status: DC | PRN
  Administered 2020-04-08: 5 mg via INTRAVENOUS

## 2020-04-08 MED ORDER — ALBUTEROL SULFATE (2.5 MG/3ML) 0.083% IN NEBU
2.5000 mg | INHALATION_SOLUTION | RESPIRATORY_TRACT | Status: DC | PRN
  Administered 2020-04-08: 2.5 mg via RESPIRATORY_TRACT

## 2020-04-08 MED ORDER — MIDAZOLAM HCL 2 MG/2ML IJ SOLN
INTRAMUSCULAR | Status: AC
  Filled 2020-04-08: qty 2

## 2020-04-08 MED ORDER — MORPHINE SULFATE (PF) 2 MG/ML IV SOLN
2.0000 mg | INTRAVENOUS | Status: DC | PRN
  Administered 2020-04-08 – 2020-04-09 (×3): 2 mg via INTRAVENOUS
  Filled 2020-04-08 (×3): qty 1

## 2020-04-08 MED ORDER — PROPOFOL 10 MG/ML IV BOLUS
INTRAVENOUS | Status: DC | PRN
  Administered 2020-04-08: 200 mg via INTRAVENOUS

## 2020-04-08 MED ORDER — LIDOCAINE HCL 1 % IJ SOLN
INTRAMUSCULAR | Status: AC
  Filled 2020-04-08: qty 20

## 2020-04-08 NOTE — Plan of Care (Signed)
  Problem: Clinical Measurements: Goal: Respiratory complications will improve Outcome: Progressing   

## 2020-04-08 NOTE — Op Note (Signed)
[facility] Cardiopulmonary Patient Name: Thomas Dominguez Procedure Date: 04/08/2020 MRN: [id number] Attending MD: Marshell Garfinkel , MD Date of Birth: [birth year] CSN: [id number] Age: 74 Admit Type: Outpatient Ethnicity: Not Hispanic or Latino Procedure:             Bronchoscopy Indications:           Abnormal CT scan of chest Providers:             Marshell Garfinkel, MD, Doristine Johns, RN, Wynonia Sours, RN, Cletis Athens, Technician Referring MD:           Medicines:             General Anesthesia Complications:         No immediate complications Estimated Blood Loss:  Estimated blood loss: none. Procedure:      Pre-Anesthesia Assessment:      - A History and Physical has been performed. Patient meds and allergies       have been reviewed. The risks and benefits of the procedure and the       sedation options and risks were discussed with the patient. All       questions were answered and informed consent was obtained. Patient       identification and proposed procedure were verified prior to the       procedure by the physician in the pre-procedure area. Mental Status       Examination: alert and oriented. Airway Examination: normal       oropharyngeal airway. Respiratory Examination: clear to auscultation. CV       Examination: RRR, no murmurs, no S3 or S4. ASA Grade Assessment: II - A       patient with mild systemic disease. After reviewing the risks and       benefits, the patient was deemed in satisfactory condition to undergo       the procedure. The anesthesia plan was to use general anesthesia.       Immediately prior to administration of medications, the patient was       re-assessed for adequacy to receive sedatives. The heart rate,       respiratory rate, oxygen saturations, blood pressure, adequacy of       pulmonary ventilation, and response to care were monitored throughout       the procedure. The physical status of the patient  was re-assessed after       the procedure.      After obtaining informed consent, the bronchoscope was passed under       direct vision. Throughout the procedure, the patient's blood pressure,       pulse, and oxygen saturations were monitored continuously. the [id number]       ([id number]) Olympus therapeutic bronchoscope was introduced through the       mouth, via the endotracheal tube and advanced to the tracheobronchial       tree of both lungs. Findings:      The endotracheal tube is in good position. The visualized portion of the       trachea is of normal caliber. The carina is sharp. The tracheobronchial       tree was examined to at least the first subsegmental level. Bronchial       mucosa and anatomy are normal; there are no endobronchial lesions, and  no secretions.      Bronchoalveolar lavage was performed in the RML medial segment (B5) of       the lung and sent for cell count, bacterial culture, viral smears &       culture, and fungal & AFB analysis and cytology. 180 mL of fluid were       instilled. 80 mL were returned. The return was cloudy. There were no       mucoid plugs in the return fluid. Multiple specimens were obtained and       pooled into one specimen, which was sent for analysis.      Fluoroscopy guided transbronchial brushings of an area of infiltration       were obtained in the apical segment of the right upper lobe, in the       lateral segment of the right middle lobe and in the lateral basal       segment of the right lower lobe with a cytology brush and sent for       routine cytology. Five samples were obtained. Transbronchial brushing       technique was selected because the sampling site was not accessible       using standard endoscopic (bronchoscopic) techniques.      Transbronchial biopsies of an area of infiltration were performed in the       posterior segment of the right upper lobe, in the anterior segment of       the right upper lobe, in  the lateral segment of the right middle lobe,       in the anterior basal segment of the right lower lobe and in the lateral       basal segment of the right lower lobe using alligator forceps and sent       for histopathology examination. The procedure was guided by fluoroscopy.       Transbronchial biopsy technique was selected because the sampling site       was not visible endoscopically. Six biopsy passes were performed. Six       biopsy samples were obtained. Impression:      - Abnormal CT scan of chest      - The airway examination was normal.      - Bronchoalveolar lavage was performed.      - Transbronchial brushings were obtained.      - Transbronchial lung biopsies were performed. Moderate Sedation:      General Anesthesia Recommendation:      - Await BAL, biopsy, brushing, culture and cytology results. Procedure Code(s):      --- Professional ---      [id number], Bronchoscopy, rigid or flexible, including fluoroscopic guidance,       when performed; with transbronchial lung biopsy(s), single lobe      42353, Bronchoscopy, rigid or flexible, including fluoroscopic guidance,       when performed; with bronchial alveolar lavage      31623, Bronchoscopy, rigid or flexible, including fluoroscopic guidance,       when performed; with brushing or protected brushings      [id number], Bronchoscopy, rigid or flexible, including fluoroscopic guidance,       when performed; with transbronchial lung biopsy(s), each additional lobe       (List separately in addition to code for primary procedure)      15400, Bronchoscopy, rigid or flexible, including fluoroscopic guidance,       when performed; with transbronchial lung biopsy(s), each additional  lobe       (List separately in addition to code for primary procedure) Diagnosis Code(s):      --- Professional ---      R93.89, Abnormal findings on diagnostic imaging of other specified body       structures CPT copyright 2019 American Medical  Association. All rights reserved. The codes documented in this report are preliminary and upon coder review may  be revised to meet current compliance requirements. Marshell Garfinkel, MD 04/08/2020 1:33:45 PM Number of Addenda: 0 Scope In: Scope Out:

## 2020-04-08 NOTE — Transfer of Care (Signed)
Immediate Anesthesia Transfer of Care Note  Patient: Thomas Dominguez. Mudgett  Procedure(s) Performed: VIDEO BRONCHOSCOPY WITH FLUORO (N/A ) BRONCHIAL BIOPSIES BRONCHIAL BRUSHINGS BRONCHIAL WASHINGS  Patient Location: Endoscopy Unit  Anesthesia Type:General  Level of Consciousness: awake and patient cooperative  Airway & Oxygen Therapy: Patient Spontanous Breathing and Patient connected to face mask  Post-op Assessment: Report given to RN and Post -op Vital signs reviewed and stable  Post vital signs: Reviewed and stable  Last Vitals:  Vitals Value Taken Time  BP    Temp    Pulse 89 04/08/20 1228  Resp 19 04/08/20 1228  SpO2 93 % 04/08/20 1228  Vitals shown include unvalidated device data.  Last Pain:  Vitals:   04/08/20 1035  TempSrc: Oral  PainSc: 0-No pain         Complications: No complications documented.

## 2020-04-08 NOTE — Progress Notes (Signed)
[place] Progress Note Patient Name: Thomas Dominguez. Swetz DOB: April 28, 1946 MRN: [id number]   Date of Service  04/08/2020  HPI/Events of Note  Patient has a history of severe GERD and past esophageal cancer, he takes an unusual combination of Omeprazole 40 mg bid and Pepcid 20 mg daily which I verified by reviewing his home medication list, there is a listed prescribing physician .  eICU Interventions  Medications ordered for him to take while hospitalized.        Kerry Kass Leiland Mihelich 04/08/2020, 8:56 PM

## 2020-04-08 NOTE — H&P (Signed)
NAME:  Thomas Dominguez, MRN:  [id number], DOB:  1946-04-02, LOS: 0 ADMISSION DATE:  04/08/2020, CONSULTATION DATE: 04/08/2020 REFERRING MD:  Bernita Raisin MD, CHIEF COMPLAINT:  04/08/2020   Brief History   74 year old undergoing work-up for possible sarcoidosis.  Developed large right pneumothorax post bronchoscopic biopsy.  Admitted for chest tube placement  Past Medical History    has a past medical history of Atrial fibrillation ([place]) (01/17/2019), BPH (benign prostatic hyperplasia) (01/17/2019), Degenerative disc disease, lumbar (01/17/2019), Depression, major, single episode, complete remission ([place]) (01/17/2019), Diverticulosis (01/17/2019), Dysrhythmia, Esophageal cancer ([place]), GERD (gastroesophageal reflux disease), H/O small bowel obstruction (01/17/2019), Hiatal hernia (01/17/2019), Hypertension, Pneumonia (11/2019), SBO (small bowel obstruction) ([place]) (11/20/2019), and Wears glasses.  Significant Hospital Events   10/14 bronchoscopy complicated by pneumothorax.  Admit for chest tube  Consults:    Procedures:  Right chest tube placement 10/14  Significant Diagnostic Tests:  Chest x-ray 10/14-interval development of large right pneumothorax.  Micro Data:    Antimicrobials:    Interim history/subjective:    Objective   Blood pressure (!) 93/49, pulse 84, temperature (!) 95.9 F (35.5 C), resp. rate 11, height 6\' 1"  (1.854 m), weight 94.8 kg, SpO2 94 %.        Intake/Output Summary (Last 24 hours) at 04/08/2020 1336 Last data filed at 04/08/2020 1223 Gross per 24 hour  Intake 400 ml  Output 0 ml  Net 400 ml   Filed Weights   04/05/20 1408 04/08/20 1035  Weight: 94.8 kg 94.8 kg    Examination: Blood pressure (!) 101/46, pulse 88, temperature (!) 95.9 F (35.5 C), resp. rate (!) 25, height 6\' 1"  (1.854 m), weight 94.8 kg, SpO2 99 %. Gen:      No acute distress HEENT:  EOMI, sclera anicteric Neck:     No masses; no thyromegaly Lungs:    Diminished breath sounds on  the right CV:         Regular rate and rhythm; no murmurs Abd:      + bowel sounds; soft, non-tender; no palpable masses, no distension Ext:    No edema; adequate peripheral perfusion Skin:      Warm and dry; no rash Neuro: alert and oriented x 3 Psych: normal mood and affect  Resolved Hospital Problem list     Assessment & Plan:  Right pneumothorax Chest tube placement -20 cm suction Follow chest x-ray  Lung nodules Work-up in progress for suspected sarcoidosis Follow bronch results.  Best practice:  Diet: Regular Pain/Anxiety/Delirium protocol (if indicated): NA VAP protocol (if indicated): NA DVT prophylaxis: Lovenox GI prophylaxis: na Glucose control: na Mobility: bed Code Status: full Family Communication: Patient and family updated Disposition: Stepdown  Labs   CBC: Recent Labs  Lab 04/02/20 1240  WBC 6.0  NEUTROABS 3.5  HGB 14.8  HCT 44.3  MCV 98.6  PLT 709.6    Basic Metabolic Panel: Recent Labs  Lab 04/02/20 1240  NA 139  K 4.3  CL 104  CO2 30  GLUCOSE 92  BUN 11  CREATININE 1.03  CALCIUM 9.2   GFR: Estimated Creatinine Clearance: 71.1 mL/min (by C-G formula based on SCr of 1.03 mg/dL). Recent Labs  Lab 04/02/20 1240  WBC 6.0    Liver Function Tests: Recent Labs  Lab 04/02/20 1240  AST 16  ALT 18  ALKPHOS 120*  BILITOT 0.3  PROT 6.9  ALBUMIN 4.0   No results for input(s): LIPASE, AMYLASE in the last 168 hours. No results for input(s): AMMONIA  in the last 168 hours.  ABG No results found for: PHART, PCO2ART, PO2ART, HCO3, TCO2, ACIDBASEDEF, O2SAT   Coagulation Profile: Recent Labs  Lab 04/02/20 1240  INR 0.9    Cardiac Enzymes: No results for input(s): CKTOTAL, CKMB, CKMBINDEX, TROPONINI in the last 168 hours.  HbA1C: No results found for: HGBA1C  CBG: No results for input(s): GLUCAP in the last 168 hours.  Review of Systems:   REVIEW OF SYSTEMS:   All negative; except for those that are bolded, which  indicate positives.  Constitutional: weight loss, weight gain, night sweats, fevers, chills, fatigue, weakness.  HEENT: headaches, sore throat, sneezing, nasal congestion, post nasal drip, difficulty swallowing, tooth/dental problems, visual complaints, visual changes, ear aches. Neuro: difficulty with speech, weakness, numbness, ataxia. CV:  chest pain, orthopnea, PND, swelling in lower extremities, dizziness, palpitations, syncope.  Resp: cough, hemoptysis, dyspnea, wheezing. GI: heartburn, indigestion, abdominal pain, nausea, vomiting, diarrhea, constipation, change in bowel habits, loss of appetite, hematemesis, melena, hematochezia.  GU: dysuria, change in color of urine, urgency or frequency, flank pain, hematuria. MSK: joint pain or swelling, decreased range of motion. Psych: change in mood or affect, depression, anxiety, suicidal ideations, homicidal ideations. Skin: rash, itching, bruising.  Past Medical History  He,  has a past medical history of Atrial fibrillation ([place]) (01/17/2019), BPH (benign prostatic hyperplasia) (01/17/2019), Degenerative disc disease, lumbar (01/17/2019), Depression, major, single episode, complete remission ([place]) (01/17/2019), Diverticulosis (01/17/2019), Dysrhythmia, Esophageal cancer ([place]), GERD (gastroesophageal reflux disease), H/O small bowel obstruction (01/17/2019), Hiatal hernia (01/17/2019), Hypertension, Pneumonia (11/2019), SBO (small bowel obstruction) ([place]) (11/20/2019), and Wears glasses.   Surgical History    Past Surgical History:  Procedure Laterality Date  . ABDOMINAL HERNIA REPAIR  2012, 2014, 2017   ventral  . COLONOSCOPY  2019  . ESOPHAGECTOMY  1997   for esophageal cancer  . PARATHYROIDECTOMY Right 01/26/2020   Procedure: RIGHT INFERIOR PARATHYROIDECTOMY;  Surgeon: [name], MD;  Location: [facility] ORS;  Service: General;  Laterality: Right;  . SPINAL FUSION  2018, 2020   L2 L5 in 2018, L5-S1 2020     Social History   reports that he has  never smoked. He has never used smokeless tobacco. He reports current alcohol use of about 7.0 standard drinks of alcohol per week. He reports that he does not use drugs.   Family History   His family history includes Emphysema in his father; GER disease in his daughter, mother, and son; Lung cancer in his sister.   Allergies Allergies  Allergen Reactions  . Mobic [Meloxicam] Other (See Comments)    SOB and muscle pain  . Statins Other (See Comments)    Muscle pain     Home Medications  Prior to Admission medications   Medication Sig Start Date End Date Taking? Authorizing Provider  albuterol (VENTOLIN HFA) 108 (90 Base) MCG/ACT inhaler Inhale 2 puffs into the lungs every 6 (six) hours as needed. Patient taking differently: Inhale 2 puffs into the lungs every 6 (six) hours as needed for wheezing or shortness of breath.  03/17/20  Yes Spero Geralds, MD  aspirin EC 81 MG tablet Take 81 mg by mouth daily.   Yes [provider]  budesonide-formoterol (SYMBICORT) 80-4.5 MCG/ACT inhaler Inhale 2 puffs into the lungs 2 (two) times daily. 12/16/19  Yes Lauraine Rinne, NP  diltiazem (CARDIZEM) 120 MG tablet TAKE 1 TABLET (120 MG TOTAL) BY MOUTH DAILY. 12/19/19  Yes Benay Pike, MD  famotidine (PEPCID) 20 MG tablet Take 1  tablet (20 mg total) by mouth at bedtime. 03/30/20  Yes Nandigam, [name], MD  methocarbamol (ROBAXIN) 750 MG tablet Take 1 tablet (750 mg total) by mouth 2 (two) times daily as needed for muscle spasms. 09/02/19  Yes Benay Pike, MD  omeprazole (PRILOSEC) 40 MG capsule TAKE 1 CAPSULE TWICE DAILY Patient taking differently: Take 40 mg by mouth in the morning and at bedtime.  02/23/20  Yes Nandigam, [name], MD  propafenone (RYTHMOL) 225 MG tablet TAKE 1 TABLET (225 MG TOTAL) BY MOUTH EVERY 8 HOURS. Patient taking differently: Take 225 mg by mouth every 8 (eight) hours.  01/13/20  Yes Donato Heinz, MD  tamsulosin (FLOMAX) 0.4 MG CAPS capsule TAKE 2 CAPSULES  EVERY DAY Patient taking differently: Take 0.4 mg by mouth in the morning and at bedtime.  08/21/19  Yes Benay Pike, MD  traMADol (ULTRAM) 50 MG tablet TAKE 1 TABLET BY MOUTH EVERY 12 HOURS AS NEEDED FOR MODERATE PAIN. Patient taking differently: Take 50 mg by mouth every 12 (twelve) hours as needed for moderate pain.  02/25/20  Yes Benay Pike, MD  venlafaxine XR (EFFEXOR-XR) 150 MG 24 hr capsule TAKE 1 CAPSULE EVERY DAY WITH BREAKFAST Patient taking differently: Take 150 mg by mouth daily with breakfast.  11/18/19  Yes Benay Pike, MD     Critical care time: NA    Marshell Garfinkel MD [facility] Please see [email or web address] for pager details.  04/08/2020, 2:36 PM

## 2020-04-08 NOTE — Procedures (Signed)
Insertion of Chest Tube Procedure Note  Weylin L. Tagliaferro  [id number]  1946/05/03  Date:04/08/20  Time:3:31 PM    Provider Performing: Marshell Garfinkel   Procedure: Chest Tube Insertion (84536)  Indication(s) Pneumothorax  Consent Risks of the procedure as well as the alternatives and risks of each were explained to the patient and/or caregiver.  Consent for the procedure was obtained and is signed in the bedside chart  Anesthesia Topical only with 1% lidocaine    Time Out Verified patient identification, verified procedure, site/side was marked, verified correct patient position, special equipment/implants available, medications/allergies/relevant history reviewed, required imaging and test results available.   Sterile Technique Maximal sterile technique including full sterile barrier drape, hand hygiene, sterile gown, sterile gloves, mask, hair covering, sterile ultrasound probe cover (if used).   Procedure Description Ultrasound used to identify appropriate pleural anatomy for placement and overlying skin marked. Area of placement cleaned and draped in sterile fashion.  A pigtail pleural catheter was placed into the right pleural space using Seldinger technique. Appropriate return of air was obtained.  The tube was connected to atrium and placed on -20 cm H2O wall suction.   Complications/Tolerance None; patient tolerated the procedure well. Chest X-ray is ordered to verify placement.   EBL Minimal  Specimen(s) none   Ezmeralda Stefanick MD [facility] Please see [email or web address] for pager details.  04/08/2020, 3:31 PM

## 2020-04-08 NOTE — Discharge Instructions (Signed)
Lung Biopsy, Care After This sheet gives you information about how to care for yourself after your procedure. Your health care provider may also give you more specific instructions depending on the type of biopsy you had. If you have problems or questions, contact your health care provider. What can I expect after the procedure? After the procedure, it is common to have: Flexible Bronchoscopy, Care After This sheet gives you information about how to care for yourself after your test. Your doctor may also give you more specific instructions. If you have problems or questions, contact your doctor. Follow these instructions at home: Eating and drinking Do not eat or drink anything (not even water) for 2 hours after your test, or until your numbing medicine (local anesthetic) wears off. When your numbness is gone and your cough and gag reflexes have come back, you may: Eat only soft foods. Slowly drink liquids. The day after the test, go back to your normal diet. Driving Do not drive for 24 hours if you were given a medicine to help you relax (sedative). Do not drive or use heavy machinery while taking prescription pain medicine. General instructions  Take over-the-counter and prescription medicines only as told by your doctor. Return to your normal activities as told. Ask what activities are safe for you. Do not use any products that have nicotine or tobacco in them. This includes cigarettes and e-cigarettes. If you need help quitting, ask your doctor. Keep all follow-up visits as told by your doctor. This is important. It is very important if you had a tissue sample (biopsy) taken. Get help right away if: You have shortness of breath that gets worse. You get light-headed. You feel like you are going to pass out (faint). You have chest pain. You cough up: More than a little blood. More blood than before. Summary Do not eat or drink anything (not even water) for 2 hours after your test, or  until your numbing medicine wears off. Do not use cigarettes. Do not use e-cigarettes. Get help right away if you have chest pain. This information is not intended to replace advice given to you by your health care provider. Make sure you discuss any questions you have with your health care provider. Document Revised: 05/25/2017 Document Reviewed: 06/30/2016 Elsevier Patient Education  [place].   A cough.  A sore throat.  Pain where a needle, bronchoscope, or incision was used to collect a biopsy sample (biopsy site). Follow these instructions at home: Medicines  Take over-the-counter and prescription medicines only as told by your health care provider.  Do not drink alcohol if your health care provider tells you not to drink.  Ask your health care provider if the medicine prescribed to you: ? Requires you to avoid driving or using heavy machinery. ? Can cause constipation. You may need to take these actions to prevent or treat constipation:  Drink enough fluid to keep your urine pale yellow.  Take over-the-counter or prescription medicines.  Eat foods that are high in fiber, such as beans, whole grains, and fresh fruits and vegetables.  Limit foods that are high in fat and processed sugars, such as fried or sweet foods.  Do not drive for 24 hours if you were given a sedative. Biopsy site care   Follow instructions from your health care provider about how to take care of your biopsy site. Make sure you: ? Wash your hands with soap and water before and after you change your bandage (dressing). If  soap and water are not available, use hand sanitizer. ? Change your dressing as told by your health care provider. ? Leave stitches (sutures), skin glue, or adhesive strips in place. These skin closures may need to stay in place for 2 weeks or longer. If adhesive strip edges start to loosen and curl up, you may trim the loose edges. Do not remove adhesive strips completely  unless your health care provider tells you to do that.  Do not take baths, swim, or use a hot tub until your health care provider approves. Ask your health care provider if you may take showers. You may only be allowed to take sponge baths.  Check your biopsy site every day for signs of infection. Check for: ? Redness, swelling, or more pain. ? Fluid or blood. ? Warmth. ? Pus or a bad smell. General instructions  Return to your normal activities as told by your health care provider. Ask your health care provider what activities are safe for you.  It is up to you to get the results of your procedure. Ask your health care provider, or the department that is doing the procedure, when your results will be ready.  Keep all follow-up visits as told by your health care provider. This is important. Contact a health care provider if:  You have a fever.  You have redness, swelling, or more pain around your biopsy site.  You have fluid or blood coming from your biopsy site.  Your biopsy site feels warm to the touch.  You have pus or a bad smell coming from your biopsy site.  You have pain that does not get better with medicine. Get help right away if:  You cough up blood.  You have trouble breathing.  You have chest pain.  You lose consciousness. Summary  After the procedure, it is common to have a sore throat and a cough.  Return to your normal activities as told by your health care provider. Ask your health care provider what activities are safe for you.  Take over-the-counter and prescription medicines only as told by your health care provider.  Report any unusual symptoms to your health care provider. This information is not intended to replace advice given to you by your health care provider. Make sure you discuss any questions you have with your health care provider. Document Revised: 07/17/2018 Document Reviewed: 07/11/2016 Elsevier Patient Education  [place].

## 2020-04-08 NOTE — Anesthesia Preprocedure Evaluation (Signed)
Anesthesia Evaluation  Patient identified by MRN, date of birth, ID band Patient awake    Reviewed: Allergy & Precautions, NPO status , Patient's Chart, lab work & pertinent test results  History of Anesthesia Complications Negative for: history of anesthetic complications  Airway Mallampati: I  TM Distance: >3 FB Neck ROM: Full    Dental  (+) Teeth Intact   Pulmonary neg pulmonary ROS,    Pulmonary exam normal        Cardiovascular hypertension, Normal cardiovascular exam+ dysrhythmias Atrial Fibrillation      Neuro/Psych negative neurological ROS  negative psych ROS   GI/Hepatic Neg liver ROS, hiatal hernia, GERD  ,H/o esophageal cancer s/p esophagectomy (1997)   Endo/Other  Primary hyperparathyroidism s/p parathyroidectomy  Renal/GU negative Renal ROS  negative genitourinary   Musculoskeletal negative musculoskeletal ROS (+)   Abdominal   Peds  Hematology negative hematology ROS (+)   Anesthesia Other Findings   Reproductive/Obstetrics                             Anesthesia Physical Anesthesia Plan  ASA: III  Anesthesia Plan: General   Post-op Pain Management:    Induction: Intravenous, Rapid sequence and Cricoid pressure planned  PONV Risk Score and Plan: 2 and Ondansetron, Dexamethasone, Treatment may vary due to age or medical condition and Midazolam  Airway Management Planned: Oral ETT  Additional Equipment: None  Intra-op Plan:   Post-operative Plan: Extubation in OR  Informed Consent: I have reviewed the patients History and Physical, chart, labs and discussed the procedure including the risks, benefits and alternatives for the proposed anesthesia with the patient or authorized representative who has indicated his/her understanding and acceptance.     Dental advisory given  Plan Discussed with:   Anesthesia Plan Comments:         Anesthesia Quick  Evaluation

## 2020-04-08 NOTE — Anesthesia Procedure Notes (Signed)
Procedure Name: Intubation Date/Time: 04/08/2020 11:22 AM Performed by: Claudia Desanctis, CRNA Pre-anesthesia Checklist: Patient identified, Emergency Drugs available, Suction available and Patient being monitored Patient Re-evaluated:Patient Re-evaluated prior to induction Oxygen Delivery Method: Circle system utilized Preoxygenation: Pre-oxygenation with 100% oxygen Induction Type: IV induction, Rapid sequence and Cricoid Pressure applied Laryngoscope Size: 2 and Miller Grade View: Grade I Tube type: Oral Tube size: 8.5 mm Number of attempts: 1 Airway Equipment and Method: Stylet Placement Confirmation: ETT inserted through vocal cords under direct vision,  positive ETCO2 and breath sounds checked- equal and bilateral Secured at: 22 cm Tube secured with: Tape Dental Injury: Teeth and Oropharynx as per pre-operative assessment

## 2020-04-08 NOTE — Anesthesia Postprocedure Evaluation (Signed)
Anesthesia Post Note  Patient: Thomas Dominguez  Procedure(s) Performed: VIDEO BRONCHOSCOPY WITH FLUORO (N/A ) BRONCHIAL BIOPSIES BRONCHIAL BRUSHINGS BRONCHIAL WASHINGS     Patient location during evaluation: Endoscopy Anesthesia Type: General Level of consciousness: awake and alert Pain management: pain level controlled Vital Signs Assessment: post-procedure vital signs reviewed and stable Respiratory status: spontaneous breathing, nonlabored ventilation and respiratory function stable Cardiovascular status: blood pressure returned to baseline and stable Postop Assessment: no apparent nausea or vomiting Anesthetic complications: no   No complications documented.  Last Vitals:  Vitals:   04/08/20 1300 04/08/20 1310  BP: (!) 94/45 (!) 101/47  Pulse: 82 84  Resp: 14 16  Temp:    SpO2: 98% 95%    Last Pain:  Vitals:   04/08/20 1310  TempSrc:   PainSc: [place]

## 2020-04-09 ENCOUNTER — Inpatient Hospital Stay (HOSPITAL_COMMUNITY): Payer: Medicare HMO

## 2020-04-09 ENCOUNTER — Telehealth: Payer: Self-pay | Admitting: Pulmonary Disease

## 2020-04-09 LAB — ACID FAST SMEAR (AFB, MYCOBACTERIA): Acid Fast Smear: NEGATIVE

## 2020-04-09 LAB — BASIC METABOLIC PANEL
Anion gap: 10 (ref 5–15)
BUN: 17 mg/dL (ref 8–23)
CO2: 25 mmol/L (ref 22–32)
Calcium: 8.9 mg/dL (ref 8.9–10.3)
Chloride: 102 mmol/L (ref 98–111)
Creatinine, Ser: 1.02 mg/dL (ref 0.61–1.24)
GFR, Estimated: >60 mL/min (ref >60)
Glucose, Bld: 125 mg/dL — ABNORMAL HIGH (ref 70–99)
Potassium: 4.5 mmol/L (ref 3.5–5.1)
Sodium: 137 mmol/L (ref 135–145)

## 2020-04-09 LAB — CYTOLOGY - NON PAP

## 2020-04-09 LAB — CBC
HCT: 41.9 % (ref 39.0–52.0)
Hemoglobin: 14.1 g/dL (ref 13.0–17.0)
MCH: 32.8 pg (ref 26.0–34.0)
MCHC: 33.7 g/dL (ref 30.0–36.0)
MCV: 97.4 fL (ref 80.0–100.0)
Platelets: 184 10*3/uL (ref 150–400)
RBC: 4.3 MIL/uL (ref 4.22–5.81)
RDW: 13.1 % (ref 11.5–15.5)
WBC: 9.3 10*3/uL (ref 4.0–10.5)
nRBC: 0 % (ref 0.0–0.2)

## 2020-04-09 LAB — SURGICAL PATHOLOGY

## 2020-04-09 NOTE — TOC Initial Note (Signed)
Transition of Care (TOC) - Initial/Assessment Note    Patient Details  Name: Thomas Dominguez. Berkovich MRN: [id number] Date of Birth: 12/24/1945  Transition of Care [facility]) CM/SW Contact:    Leeroy Cha, RN Phone Number: 04/09/2020, 1:06 PM  Clinical Narrative:                 74 year old undergoing work-up for possible sarcoidosis.  Developed large right pneumothorax post bronchoscopic biopsy.  Admitted for chest tube placement following for progression and toc needs  Expected Discharge Plan: [place] Barriers to Discharge: Barriers Unresolved (comment) (chest tube)   Patient Goals and CMS Choice Patient states their goals for this hospitalization and ongoing recovery are:: to go home CMS Medicare.gov Compare Post Acute Care list provided to:: Patient    Expected Discharge Plan and Services Expected Discharge Plan: [place]   Discharge Planning Services: CM Consult   Living arrangements for the past 2 months: Single Family Home Expected Discharge Date: 04/08/20                                    Prior Living Arrangements/Services Living arrangements for the past 2 months: Single Family Home Lives with:: Spouse Patient language and need for interpreter reviewed:: Yes Do you feel safe going back to the place where you live?: Yes      Need for Family Participation in Patient Care: Yes (Comment) Care giver support system in place?: Yes (comment)   Criminal Activity/Legal Involvement Pertinent to Current Situation/Hospitalization: No - Comment as needed  Activities of Daily Living Home Assistive Devices/Equipment: Eyeglasses, Hearing aid (bilateral hearing aides) ADL Screening (condition at time of admission) Patient's cognitive ability adequate to safely complete daily activities?: Yes Is the patient deaf or have difficulty hearing?: Yes (wears 2 hearing aides) Does the patient have difficulty seeing, even when wearing  glasses/contacts?: No Does the patient have difficulty concentrating, remembering, or making decisions?: No Patient able to express need for assistance with ADLs?: Yes Does the patient have difficulty dressing or bathing?: No Independently performs ADLs?: Yes (appropriate for developmental age) Does the patient have difficulty walking or climbing stairs?: Yes (gets sob) Weakness of Legs: Both Weakness of Arms/Hands: Both  Permission Sought/Granted                  Emotional Assessment Appearance:: Appears stated age Attitude/Demeanor/Rapport: Engaged Affect (typically observed): Calm Orientation: : Oriented to Place, Oriented to Self, Oriented to  Time, Oriented to Situation Alcohol / Substance Use: Not Applicable Psych Involvement: No (comment)  Admission diagnosis:  Pneumothorax [J93.9] Acute pneumothorax [J93.83] Patient Active Problem List   Diagnosis Date Noted  . Pneumothorax 04/08/2020  . Acute pneumothorax 04/08/2020  . Sarcoidosis   . 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 SBO (small bowel obstruction) ([place]) 11/20/2019  . Acute ataxia 09/30/2019  . 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  . Primary hyperparathyroidism ([place]) 09/30/2019  . Dyspnea on exertion 09/05/2019  . Hyperlipidemia 09/05/2019  . Leg swelling 01/17/2019  . Atrial fibrillation ([place]) 01/17/2019  . Acid reflux disease 01/17/2019  . H/O small bowel obstruction 01/17/2019  . Hiatal hernia 01/17/2019  . Depression, major, single episode, complete remission ([place]) 01/17/2019  . BPH (benign prostatic hyperplasia) 01/17/2019  . Degenerative disc disease, lumbar 01/17/2019  . Diverticulosis 01/17/2019   PCP:  Benay Pike,  MD Pharmacy:   [id number] - , [place] - [address] [place] [address] Phone: [phone] Fax: [phone]  [facility] -  [place] [place] [address] Phone: [phone] Fax: [phone]     Social Determinants of Health (SDOH) Interventions    Readmission Risk Interventions No flowsheet data found.

## 2020-04-09 NOTE — Discharge Summary (Signed)
Physician Discharge Summary  Patient ID: Thomas Dominguez. Rockett MRN: [id number] DOB/AGE: 1946-02-24 74 y.o.  Admit date: 04/08/2020 Discharge date: 04/09/2020    Discharge Diagnoses:  Iatrogenic Right Pneumothorax Pulmonary Nodules                                                                      DISCHARGE PLAN BY DIAGNOSIS     Iatrogenic Right Pneumothorax Pulmonary Nodules   Discharge Plan: -follow up in pulmonary clinic with follow up CXR  -follow up on pathology in office  -patient instructed if he experiences new shortness of breath or chest pain to report to the ER for evaluation  -patient instructed to keep chest tube site covered until well healed, clean with soap and water / pat dry, ok to use a band aide to cover site if dressing falls off.   -resume all prior home medications, no changes to regimen                   DISCHARGE SUMMARY   74 y/o M who presented to [facility] on 10/15 for planned bronchoscopy to evaluate for sarcoidosis.  The patient underwent bronchoscopy as planned.  Post procedure CXR showed a large right iatrogenic pneumothorax.  He was admitted for further care.  Right chest tube was placed to decompress the pneumothorax.  On 10/15 follow up CXR showed resolution of pneumothorax and a loop of the pigtail catheter that was extra-thoracic.  Given resolution and chest tube findings, decision was made to remove the chest tube.  Follow up CXR post chest tube removal showed complete resolution of pneumothorax.  The patient made rapid improvement in clinical status and due this was discharged earlier than expected. He was medically cleared for discharge on 10/15 with plans as above.         SIGNIFICANT DIAGNOSTIC STUDIES PCXR 10/14 >> greater than 50% large right pneumothorax without mediastinal shift    MICRO DATA  AFB 10/14 >>  PJP DFA 10/14 >>  Sputum Culture 10/14 >>  MRSA PCR 10/14 >>   TUBES / LINES R Chest Tube 10/14 >>  10/15   Discharge Exam: General:  Adult male sitting up in chair in NAD HEENT: MM pink/moist, no jvd, anicteric  Neuro: AAOx4, speech clear, MAE CV: s1s2 rrr, no m/r/g PULM: non-labored on RA, lungs bilaterally clear with good air entry  GI: soft, bsx4 active  Extremities: warm/dry, no edema  Skin: no rashes or lesions  Vitals:   04/09/20 0830 04/09/20 1013 04/09/20 1210 04/09/20 1220  BP:  (!) 100/50  (!) 150/106  Pulse: 98 85  86  Resp: _0 Temp:   97.9 F (36.6 C)   TempSrc:   Oral   SpO2: 92% 98%  94%  Weight:      Height:         Discharge Labs  BMET Recent Labs  Lab 04/08/20 1655 04/09/20 0248  NA  --  137  K  --  4.5  CL  --  102  CO2  --  25  GLUCOSE  --  125*  BUN  --  17  CREATININE 1.09 1.02  CALCIUM  --  8.9    CBC Recent Labs  Lab 04/08/20 1655 04/09/20 0248  HGB 15.2 14.1  HCT 44.9 41.9  WBC 8.6 9.3  PLT 201 184    Anti-Coagulation No results for input(s): INR in the last 168 hours.   Allergies as of 04/09/2020      Reactions   Mobic [meloxicam] Other (See Comments)   SOB and muscle pain   Statins Other (See Comments)   Muscle pain      Medication List    TAKE these medications   albuterol 108 (90 Base) MCG/ACT inhaler Commonly known as: VENTOLIN HFA Inhale 2 puffs into the lungs every 6 (six) hours as needed. What changed: reasons to take this   aspirin EC 81 MG tablet Take 81 mg by mouth daily.   budesonide-formoterol 80-4.5 MCG/ACT inhaler Commonly known as: Symbicort Inhale 2 puffs into the lungs 2 (two) times daily.   diltiazem 120 MG tablet Commonly known as: CARDIZEM TAKE 1 TABLET (120 MG TOTAL) BY MOUTH DAILY.   famotidine 20 MG tablet Commonly known as: Pepcid Take 1 tablet (20 mg total) by mouth at bedtime.   methocarbamol 750 MG tablet Commonly known as: ROBAXIN Take 1 tablet (750 mg total) by mouth 2 (two) times daily as needed for muscle spasms.   omeprazole 40 MG capsule Commonly known  as: PRILOSEC TAKE 1 CAPSULE TWICE DAILY What changed: when to take this   propafenone 225 MG tablet Commonly known as: RYTHMOL TAKE 1 TABLET (225 MG TOTAL) BY MOUTH EVERY 8 HOURS. What changed:   how much to take  how to take this  when to take this  additional instructions   tamsulosin 0.4 MG Caps capsule Commonly known as: FLOMAX TAKE 2 CAPSULES EVERY DAY What changed:   how much to take  when to take this   traMADol 50 MG tablet Commonly known as: ULTRAM TAKE 1 TABLET BY MOUTH EVERY 12 HOURS AS NEEDED FOR MODERATE PAIN. What changed: See the new instructions.   venlafaxine XR 150 MG 24 hr capsule Commonly known as: EFFEXOR-XR TAKE 1 CAPSULE EVERY DAY WITH BREAKFAST What changed: See the new instructions.         Disposition: Home.  No new home health needs identified prior to discharge.   Discharged Condition: Thomas Dominguez has met maximum benefit of inpatient care and is medically stable and cleared for discharge.  Patient is pending follow up as above.      Time spent on disposition:  Greater than 35 minutes     Signed: Noe Gens, NP-C [place] [facility] Pgr: [phone] Office: [phone]

## 2020-04-09 NOTE — Progress Notes (Signed)
   NAME:  Thomas Dominguez, MRN:  [id number], DOB:  06/03/1946, LOS: 1 ADMISSION DATE:  04/08/2020, CONSULTATION DATE: 04/08/2020 REFERRING MD:  Bernita Raisin MD, CHIEF COMPLAINT:  04/08/2020   Brief History   74 year old undergoing work-up for possible sarcoidosis.  Developed large right pneumothorax post bronchoscopic biopsy.  Admitted for chest tube placement  Past Medical History    has a past medical history of Atrial fibrillation ([place]) (01/17/2019), BPH (benign prostatic hyperplasia) (01/17/2019), Degenerative disc disease, lumbar (01/17/2019), Depression, major, single episode, complete remission ([place]) (01/17/2019), Diverticulosis (01/17/2019), Dysrhythmia, Esophageal cancer ([place]), GERD (gastroesophageal reflux disease), H/O small bowel obstruction (01/17/2019), Hiatal hernia (01/17/2019), Hypertension, Pneumonia (11/2019), SBO (small bowel obstruction) ([place]) (11/20/2019), and Wears glasses.  Significant Hospital Events   10/14 bronchoscopy complicated by pneumothorax.  Admit for chest tube  Consults:    Procedures:  Right chest tube placement 10/14  Significant Diagnostic Tests:  Chest x-ray 10/14-interval development of large right pneumothorax. Chest x-ray 10/15-chest tube in position.  Resolution of pneumothorax I have reviewed the images personally.  Micro Data:    Antimicrobials:    Interim history/subjective:   Complains of some pain at the chest tube insertion.  Otherwise had an okay night.  Objective   Blood pressure (!) 109/43, pulse 79, temperature 98.2 F (36.8 C), temperature source Oral, resp. rate 14, height 6\' 1"  (1.854 m), weight 94 kg, SpO2 97 %.        Intake/Output Summary (Last 24 hours) at 04/09/2020 0811 Last data filed at 04/09/2020 0500 Gross per 24 hour  Intake 890 ml  Output 805 ml  Net 85 ml   Filed Weights   04/05/20 1408 04/08/20 1035 04/09/20 0500  Weight: 94.8 kg 94.8 kg 94 kg    Examination: Gen:      No acute distress HEENT:  EOMI,  sclera anicteric Neck:     No masses; no thyromegaly Lungs:    Clear to auscultation bilaterally; normal respiratory effort CV:         Regular rate and rhythm; no murmurs Abd:      + bowel sounds; soft, non-tender; no palpable masses, no distension Ext:    No edema; adequate peripheral perfusion Skin:      Warm and dry; no rash Neuro: alert and oriented x 3 Psych: normal mood and affect  Resolved Hospital Problem list     Assessment & Plan:  Right pneumothorax Does not have air leak.  Placed to waterseal and repeat chest x-ray.  If no recurrence of pneumothorax then will remove chest tube and follow  Lung nodules Work-up in progress for suspected sarcoidosis Follow bronch results.  Best practice:  Diet: Regular Pain/Anxiety/Delirium protocol (if indicated): NA VAP protocol (if indicated): NA DVT prophylaxis: Lovenox GI prophylaxis: na Glucose control: na Mobility: bed Code Status: full Family Communication: Patient and family updated Disposition: Stepdown  Critical care time: NA   Marshell Garfinkel MD [place] [facility] Please see [email or web address] for pager details.  04/09/2020, 8:11 AM

## 2020-04-09 NOTE — Telephone Encounter (Signed)
Appt for HFU with Mannam scheduled for 04/29/20 at 9:15 am.

## 2020-04-09 NOTE — Telephone Encounter (Signed)
Patient is being discharge from hospital.  Will you please arrange for hospital follow up appt with Dr. Vaughan Browner in 1-2 weeks.     Thank you! Noe Gens, MSN, NP-C, AGACNP-BC  Pulmonary & Critical Care 04/09/2020, 11:23 AM   Please see [email or web address] for pager details.

## 2020-04-10 LAB — CULTURE, RESPIRATORY W GRAM STAIN: Culture: NO GROWTH

## 2020-04-11 ENCOUNTER — Encounter: Payer: Self-pay | Admitting: Family Medicine

## 2020-04-12 ENCOUNTER — Encounter (HOSPITAL_COMMUNITY): Payer: Self-pay | Admitting: Pulmonary Disease

## 2020-04-13 ENCOUNTER — Other Ambulatory Visit: Payer: Self-pay | Admitting: Pulmonary Disease

## 2020-04-13 DIAGNOSIS — J849 Interstitial pulmonary disease, unspecified: Secondary | ICD-10-CM

## 2020-04-15 ENCOUNTER — Other Ambulatory Visit: Payer: Medicare HMO

## 2020-04-15 ENCOUNTER — Other Ambulatory Visit: Payer: Self-pay | Admitting: Gastroenterology

## 2020-04-15 DIAGNOSIS — J849 Interstitial pulmonary disease, unspecified: Secondary | ICD-10-CM

## 2020-04-17 ENCOUNTER — Other Ambulatory Visit (HOSPITAL_COMMUNITY): Payer: Medicare HMO

## 2020-04-19 NOTE — Telephone Encounter (Signed)
Dr Shearon Stalls, please see pt email:  I had an appointment last week with Dr. Stevphen Rochester, my gastro Dr.  I shared with her my ongoing lung issues.  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

## 2020-04-20 ENCOUNTER — Telehealth: Payer: Self-pay | Admitting: Internal Medicine

## 2020-04-20 ENCOUNTER — Other Ambulatory Visit: Payer: Self-pay | Admitting: Family Medicine

## 2020-04-20 NOTE — Telephone Encounter (Signed)
Dr. Shearon Stalls, The patient wanted you to know that he dropped off the ILD paperwork.  Thank you.

## 2020-04-21 LAB — HYPERSENSITIVITY PNUEMONITIS PROFILE
ASPERGILLUS FUMIGATUS: NEGATIVE
Faenia retivirgula: NEGATIVE
Pigeon Serum: NEGATIVE
S. VIRIDIS: NEGATIVE
T. CANDIDUS: NEGATIVE
T. VULGARIS: NEGATIVE

## 2020-04-21 LAB — ANGIOTENSIN CONVERTING ENZYME: Angiotensin-Converting Enzyme: 67 U/L (ref 9–67)

## 2020-04-29 ENCOUNTER — Inpatient Hospital Stay: Payer: Medicare HMO | Admitting: Pulmonary Disease

## 2020-05-03 ENCOUNTER — Telehealth: Payer: Self-pay | Admitting: Gastroenterology

## 2020-05-03 NOTE — Telephone Encounter (Signed)
Pt is wanting to update the nurse regarding a procedure, 10-14 his lung deflated some during a procedure (biopsy).

## 2020-05-03 NOTE — Telephone Encounter (Signed)
Patient had a bronchoscopy. He developed a pneumothorax and stayed overnight in the hospital. He reports he feels he is back to his baseline, breathing as well as before the bronchoscopy. The EGD is presently scheduled for 05/07/20. Does this need to be pushed out further?

## 2020-05-03 NOTE — Telephone Encounter (Signed)
I will check with his pulmonologist and see if we need to reschedule the procedure.

## 2020-05-04 ENCOUNTER — Ambulatory Visit: Payer: Self-pay | Admitting: Pulmonary Disease

## 2020-05-04 DIAGNOSIS — J849 Interstitial pulmonary disease, unspecified: Secondary | ICD-10-CM | POA: Diagnosis not present

## 2020-05-04 NOTE — Telephone Encounter (Signed)
Patient advised. He will be here as planned on 05/07/20.

## 2020-05-04 NOTE — Telephone Encounter (Signed)
Discussed with Pulmonary, we can proceed with EGD as planned in [place]. His lung functions is back at baseline. Thanks

## 2020-05-07 ENCOUNTER — Ambulatory Visit (AMBULATORY_SURGERY_CENTER): Payer: Medicare HMO | Admitting: Gastroenterology

## 2020-05-07 ENCOUNTER — Encounter: Payer: Self-pay | Admitting: Gastroenterology

## 2020-05-07 ENCOUNTER — Other Ambulatory Visit: Payer: Self-pay

## 2020-05-07 VITALS — BP 104/60 | HR 75 | Temp 97.1°F | Resp 9 | Ht 73.0 in | Wt 209.0 lb

## 2020-05-07 DIAGNOSIS — K2270 Barrett's esophagus without dysplasia: Secondary | ICD-10-CM

## 2020-05-07 DIAGNOSIS — K219 Gastro-esophageal reflux disease without esophagitis: Secondary | ICD-10-CM | POA: Diagnosis not present

## 2020-05-07 LAB — FUNGAL ORGANISM REFLEX

## 2020-05-07 LAB — FUNGUS CULTURE RESULT

## 2020-05-07 LAB — FUNGUS CULTURE WITH STAIN

## 2020-05-07 MED ORDER — SODIUM CHLORIDE 0.9 % IV SOLN: 500.0000 mL | Freq: Once | INTRAVENOUS | Status: DC

## 2020-05-07 MED ORDER — METOCLOPRAMIDE HCL 5 MG PO TABS: 5.0000 mg | ORAL_TABLET | Freq: Four times a day (QID) | ORAL | 0 refills | Status: DC

## 2020-05-07 NOTE — Op Note (Signed)
[facility] Patient Name: Thomas Dominguez Procedure Date: 05/07/2020 10:07 AM MRN: [id number] Endoscopist: Mauri Pole , MD Age: 74 Referring MD:  Date of Birth: 04-Mar-1946 Gender: Male Account #: [id number] Procedure:                Upper GI endoscopy Indications:              Dysphagia, Surveillance for malignancy due to                            personal history of Barrett's esophagus,                            Surveillance for malignancy due to personal history                            of esophageal cancer Procedure:                Pre-Anesthesia Assessment:                           - Prior to the procedure, a History and Physical                            was performed, and patient medications and                            allergies were reviewed. The patient's tolerance of                            previous anesthesia was also reviewed. The risks                            and benefits of the procedure and the sedation                            options and risks were discussed with the patient.                            All questions were answered, and informed consent                            was obtained. Prior Anticoagulants: The patient has                            taken no previous anticoagulant or antiplatelet                            agents. ASA Grade Assessment: III - A patient with                            severe systemic disease. After reviewing the risks                            and benefits, the patient was deemed in  satisfactory condition to undergo the procedure.                           After obtaining informed consent, the endoscope was                            passed under direct vision. Throughout the                            procedure, the patient's blood pressure, pulse, and                            oxygen saturations were monitored continuously. The                            Endoscope  was introduced through the mouth, and                            advanced to the prepyloric region, stomach. The                            upper GI endoscopy was accomplished without                            difficulty. The patient tolerated the procedure                            well. Scope In: Scope Out: Findings:                 An esophago-gastric anastomosis was found in the                            middle third of the esophagus, 20cm from incissors.                            No evidence of recurrent Barrett's esophagus.                            Regular Z-line at esophago-gastric anastomosis.                            Retained secretions in the esophagus with no                            motility or visible peristalsis.                           A large amount of a phytobezoar was found in the                            entire examined stomach. Scope could not be                            extended into the duodenum. Cannot exclude gastric  outlet obstruction. Complications:            No immediate complications. Estimated Blood Loss:     Estimated blood loss: none. Impression:               - An esophago-gastric anastomosis was found.                           - A large amount of a phytobezoar in the stomach.                           - No specimens collected. Recommendation:           - Patient has a contact number available for                            emergencies. The signs and symptoms of potential                            delayed complications were discussed with the                            patient. Return to normal activities tomorrow.                            Written discharge instructions were provided to the                            patient.                           - Gastroparesis diet. Liquid diet today and advance                            to soft diet as tolerated. Small meals                           - Continue  present medications.                           - Reglan 5mg  before meals and bedtime as needed X                            120 tabs                           - Follow an antireflux regimen.                           - Schedule upper GI series to exclude gastric                            outlet obstruction                           - Repeat EGD next available appointment in 3-4 weeks Mauri Pole, MD 05/07/2020 10:29:03 AM This report has been  signed electronically.

## 2020-05-07 NOTE — Progress Notes (Signed)
pt tolerated well. VSS. awake and to recovery. Report given to RN. Bite block inserted and removed without truma. 

## 2020-05-07 NOTE — Patient Instructions (Signed)
YOU HAD AN ENDOSCOPIC PROCEDURE TODAY AT THE [facility]:   Refer to the procedure report that was given to you for any specific questions about what was found during the examination.  If the procedure report does not answer your questions, please call your gastroenterologist to clarify.  If you requested that your care partner not be given the details of your procedure findings, then the procedure report has been included in a sealed envelope for you to review at your convenience later.  YOU SHOULD EXPECT: Some feelings of bloating in the abdomen. Passage of more gas than usual.  Walking can help get rid of the air that was put into your GI tract during the procedure and reduce the bloating. If you had a lower endoscopy (such as a colonoscopy or flexible sigmoidoscopy) you may notice spotting of blood in your stool or on the toilet paper. If you underwent a bowel prep for your procedure, you may not have a normal bowel movement for a few days.  Please Note:  You might notice some irritation and congestion in your nose or some drainage.  This is from the oxygen used during your procedure.  There is no need for concern and it should clear up in a day or so.  SYMPTOMS TO REPORT IMMEDIATELY:    Following upper endoscopy (EGD)  Vomiting of blood or coffee ground material  New chest pain or pain under the shoulder blades  Painful or persistently difficult swallowing  New shortness of breath  Fever of 100F or higher  Black, tarry-looking stools  For urgent or emergent issues, a gastroenterologist can be reached at any hour by calling [phone]. Do not use MyChart messaging for urgent concerns.    DIET:  We do recommend a small meal at first, but then you may proceed to your regular diet.  Drink plenty of fluids but you should avoid alcoholic beverages for 24 hours.  ACTIVITY:  You should plan to take it easy for the rest of today and you should NOT DRIVE or use heavy machinery  until tomorrow (because of the sedation medicines used during the test).    FOLLOW UP: Our staff will call the number listed on your records 48-72 hours following your procedure to check on you and address any questions or concerns that you may have regarding the information given to you following your procedure. If we do not reach you, we will leave a message.  We will attempt to reach you two times.  During this call, we will ask if you have developed any symptoms of COVID 19. If you develop any symptoms (ie: fever, flu-like symptoms, shortness of breath, cough etc.) before then, please call [phone].  If you test positive for Covid 19 in the 2 weeks post procedure, please call and report this information to us.    If any biopsies were taken you will be contacted by phone or by letter within the next 1-3 weeks.  Please call us at [phone] if you have not heard about the biopsies in 3 weeks.    SIGNATURES/CONFIDENTIALITY: You and/or your care partner have signed paperwork which will be entered into your electronic medical record.  These signatures attest to the fact that that the information above on your After Visit Summary has been reviewed and is understood.  Full responsibility of the confidentiality of this discharge information lies with you and/or your care-partner. 

## 2020-05-07 NOTE — Progress Notes (Signed)
VS- Arman Bogus RN

## 2020-05-10 ENCOUNTER — Encounter: Payer: Self-pay | Admitting: Internal Medicine

## 2020-05-10 ENCOUNTER — Other Ambulatory Visit: Payer: Self-pay

## 2020-05-10 ENCOUNTER — Ambulatory Visit: Payer: Medicare HMO | Admitting: Internal Medicine

## 2020-05-10 DIAGNOSIS — K219 Gastro-esophageal reflux disease without esophagitis: Secondary | ICD-10-CM | POA: Diagnosis not present

## 2020-05-10 DIAGNOSIS — J690 Pneumonitis due to inhalation of food and vomit: Secondary | ICD-10-CM

## 2020-05-10 NOTE — Patient Instructions (Addendum)
The patient should have follow up scheduled with myself in 4 months.   Can stop taking the symbicort and monitor your response to breathing.  Continue taking albuterol.

## 2020-05-10 NOTE — Progress Notes (Addendum)
   Interstitial Lung Disease Multidisciplinary Conference   Thomas Dominguez    MRN [id number]    DOB Feb 10, 1946  Primary Care Physician:Olson, Garen Lah, MD  Referring Physician: Dr [name] MD  Time of Conference: 7.30am- 8.30am Date of conference: 05/04/2020 Location of Conference: -  Virtual  Participating Pulmonary: Dr. 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  No lymphadenopathy.  No gradient noted  - What is the final conclusion per 2018 ATS/Fleischner Criteria -Alternate  Pathology discussion of biopsy : Bronchoscopy 04/08/2020 BAL with 41% lymphocytes Transbronchial biopsies with multinucleated giant cells.  Right middle lobe shows an area that may indicate a vague poorly formed granuloma but is not convincing. Calcifications noted in the areas of inflammation.   PFTs:  12/12/2019-normal with no restriction or diffusion impairment   MDD Impression/Recs:  The patient has increased lymphocytes on BAL but the CT scan is not suggestive of hypersensitivity pneumonitis.  There are no exposures noted on ILD questionnaire.  Hypersensitivity pneumonitis not excluded per ATS criteria.  CT scan findings are suggestive of sarcoid but review of path is indeterminate. Path shows no clear evidence of granuloma though multinucleated giant cells in the right middle and right lower lobe and a poorly formed collection of inflammatory cells noted in the right middle lobe are noted.  These changes are nonspecific and presence of  calcification in areas of lung inflammation suggests retained minute foreign body and favors aspiration in the right clinical setting.   Time Spent in preparation and discussion:  > 30 min    SIGNATURE   Almadelia Looman MD [facility] Please see [email or web address] for pager details.   05/10/2020, 5:03 AM 4:43 AM 05/10/2020  References: Diagnosis of Hypersensitivity Pneumonitis in Adults. An Official ATS/JRS/ALAT Clinical Practice Guideline. Ragu G et al, [place] Aug 1;202(3):e36-e69.

## 2020-05-10 NOTE — Progress Notes (Signed)
Thomas Dominguez    [id number]    [birth year]  Primary Care Physician:Olson, Garen Lah, MD Date of Appointment: 05/10/2020 Established Patient Visit  Chief complaint:   Chief Complaint  Patient presents with  . Hospitalization Follow-up    doing much better today.  still having some SHOB with activity    HPI: Thomas Dominguez is a 74 y.o. gentleman who presented initially for dyspnea evaluation.  Concern for ILD  Interval Updates: Has had bronchoscopy and EGD. Had postprocedural pneumothorax, now resolved.   Presented case in MDD ILD conference. Felt to be secondary to recurrent aspiration from uncontrolled GERD.  Has been started on reglan and is getting evaluated for gastric outlet obstruction. By starting reglan and sleeping upright at night (has motorized bed,) has improvement in nocturnal symptoms. Symbicort is down to once a day because he was having trouble sleeping at night. He isn't sure if its helping. Albuterol he takes once a day usually before exercise and no other prn uses. Still has dyspnea walking up hills and heavy exercise.   ILD questionnaire reviewed - he has previous exposure to farms and gardening. Otherwise no occupational hazards.   I have reviewed the patient's family social and past medical history and updated as appropriate.   Past Medical History:  Diagnosis Date  . Atrial fibrillation ([place]) 01/17/2019  . BPH (benign prostatic hyperplasia) 01/17/2019  . Degenerative disc disease, lumbar 01/17/2019  . Depression, major, single episode, complete remission ([place]) 01/17/2019  . Diverticulosis 01/17/2019  . Dysrhythmia   . Esophageal cancer ([place])   . GERD (gastroesophageal reflux disease)   . H/O small bowel obstruction 01/17/2019  . Hiatal hernia 01/17/2019  . Hypertension   . Pneumonia 11/2019  . SBO (small bowel obstruction) ([place]) 11/20/2019  . Wears glasses     Past Surgical History:  Procedure Laterality Date  . ABDOMINAL HERNIA REPAIR   2012, 2014, 2017   ventral  . BRONCHIAL BIOPSY  04/08/2020   Procedure: BRONCHIAL BIOPSIES;  Surgeon: Marshell Garfinkel, MD;  Location: [facility];  Service: Cardiopulmonary;;  . BRONCHIAL BRUSHINGS  04/08/2020   Procedure: BRONCHIAL BRUSHINGS;  Surgeon: Marshell Garfinkel, MD;  Location: [facility];  Service: Cardiopulmonary;;  . BRONCHIAL WASHINGS  04/08/2020   Procedure: BRONCHIAL WASHINGS;  Surgeon: Marshell Garfinkel, MD;  Location: [facility];  Service: Cardiopulmonary;;  . COLONOSCOPY  2019  . ESOPHAGECTOMY  1997   for esophageal cancer  . PARATHYROIDECTOMY Right 01/26/2020   Procedure: RIGHT INFERIOR PARATHYROIDECTOMY;  Surgeon: [name], MD;  Location: [facility];  Service: General;  Laterality: Right;  . SPINAL FUSION  2018, 2020   L2 L5 in 2018, L5-S1 2020  . VIDEO BRONCHOSCOPY N/A 04/08/2020   Procedure: VIDEO BRONCHOSCOPY WITH FLUORO;  Surgeon: Marshell Garfinkel, MD;  Location: [facility];  Service: Cardiopulmonary;  Laterality: N/A;    Family History  Problem Relation Age of Onset  . Emphysema Father        smoker  . GER disease Daughter   . GER disease Son   . GER disease Mother   . Lung cancer Sister        never smoked  . Colon cancer Neg Hx   . Stomach cancer Neg Hx     Social History   Occupational History  . Occupation: retired    Fish farm manager: [employer]  Tobacco Use  . Smoking status: Never Smoker  . Smokeless tobacco: Never Used  Vaping Use  . Vaping  Use: Never used  Substance and Sexual Activity  . Alcohol use: Yes    Alcohol/week: 7.0 standard drinks    Types: 7 Cans of beer per week  . Drug use: Never  . Sexual activity: Not on file     Physical Exam: Blood pressure 130/72, pulse 90, temperature 97.6 F (36.4 C), temperature source Tympanic, weight 210 lb 8 oz (95.5 kg), SpO2 99 %.  Gen:      No acute distress Lungs:    No increased respiratory effort, symmetric chest wall excursion, clear to auscultation bilaterally, no wheezes or  crackles CV:         Regular rate and rhythm; no murmurs, rubs, or gallops.  No pedal edema   Data Reviewed: Imaging: I have personally reviewed the CT Chest from September 2021 which shows some peribronchovascular opacities and micronodules with surrounding ground glass.   PFTs:  PFT Results Latest Ref Rng & Units 12/12/2019  FVC-Pre L 3.06  FVC-Predicted Pre % 65  FVC-Post L 3.35  FVC-Predicted Post % 71  Pre FEV1/FVC % % 74  Post FEV1/FCV % % 78  FEV1-Pre L 2.25  FEV1-Predicted Pre % 66  FEV1-Post L 2.60  DLCO uncorrected ml/min/mmHg 20.05  DLCO UNC% % 74  DLCO corrected ml/min/mmHg 20.59  DLCO COR %Predicted % 76  DLVA Predicted % 98  TLC L 6.04  TLC % Predicted % 81  RV % Predicted % 102   I have personally reviewed the patient's PFTs and they are normal with +BD response  Labs: HP panel negative Pathology reviewed from 04/08/2020 A. LUNG, RUL, BIOPSY:  - Benign unremarkable bronchiolar mucosa and lung parenchyma  - Negative for granulomas or increased eosinophils   B. LUNG, RML, BIOPSY:  - Benign lung parenchyma with a poorly formed nonnecrotizing granuloma  with multinucleated giant cells. See comment   C. LUNG, RLL, BIOPSY:  - Benign bronchiolar mucosa and lung parenchyma with a multinucleated  giant cell. See comment   Immunization status: Immunization History  Administered Date(s) Administered  . Fluad Quad(high Dose 65+) 04/23/2019, 03/17/2020  . Influenza Inj Mdck Quad Pf 07/04/2017, 06/11/2018  . Influenza, Quadrivalent, Recombinant, Inj, Pf 04/21/2019  . Influenza-Unspecified 04/28/2019  . PFIZER SARS-COV-2 Vaccination 11/26/2019, 12/17/2019  . Pneumococcal Conjugate-13 03/22/2020  . Tdap 03/22/2020    Assessment:  Dyspnea on Exertion Chronic Aspiration  Plan/Recommendations: Extensive GI work up noted and plan in place to pursue further imaging to rule out gastric outlet obstruction. Agree with Reglan Stop symbicort - likely this is not  a steroid responsive pulmonary process Continue as needed albuterol  Return to Care: Return in about 4 months (around 09/07/2020).  [name], MD Pulmonary and [place]

## 2020-05-11 ENCOUNTER — Telehealth: Payer: Self-pay

## 2020-05-11 ENCOUNTER — Other Ambulatory Visit: Payer: Self-pay

## 2020-05-11 DIAGNOSIS — T189XXA Foreign body of alimentary tract, part unspecified, initial encounter: Secondary | ICD-10-CM

## 2020-05-11 DIAGNOSIS — K56609 Unspecified intestinal obstruction, unspecified as to partial versus complete obstruction: Secondary | ICD-10-CM

## 2020-05-11 NOTE — Telephone Encounter (Signed)
  Follow up Call-  Call back number 05/07/2020  Post procedure Call Back phone  # [phone]  Permission to leave phone message Yes     Patient questions:  Do you have a fever, pain , or abdominal swelling? No. Pain Score  0 *  Have you tolerated food without any problems? Yes.    Have you been able to return to your normal activities? Yes.    Do you have any questions about your discharge instructions: Diet   No. Medications  No. Follow up visit  No.  Do you have questions or concerns about your Care? No.  Actions: * If pain score is 4 or above: No action needed, pain <4.  Have you developed a fever since your procedure? no 2.   Have you had an respiratory symptoms (SOB or cough) since your procedure? no  3.   Have you tested positive for COVID 19 since your procedure no  4.   Have you had any family members/close contacts diagnosed with the COVID 19 since your procedure?  no   If yes to any of these questions please route to Joylene John, RN and Joella Prince, RN

## 2020-05-11 NOTE — Progress Notes (Signed)
error 

## 2020-05-11 NOTE — Telephone Encounter (Signed)
Spoke with the patient. Instructed for the upper Gi series on 05/17/20 at [facility] 10:00 am. Eartha Inch at 9:45 am. NPO p MN. Moved the repeat EGD to 06/17/20 at 10:00 am. Patient will keep the Pre-Visit as it is on 06/09/20.

## 2020-05-17 ENCOUNTER — Other Ambulatory Visit: Payer: Self-pay

## 2020-05-17 ENCOUNTER — Ambulatory Visit (HOSPITAL_COMMUNITY)
Admission: RE | Admit: 2020-05-17 | Discharge: 2020-05-17 | Disposition: A | Discharge status: Home / Self Care | Payer: Medicare HMO | Source: Ambulatory Visit | Attending: Gastroenterology | Admitting: Gastroenterology

## 2020-05-17 DIAGNOSIS — K56609 Unspecified intestinal obstruction, unspecified as to partial versus complete obstruction: Secondary | ICD-10-CM | POA: Diagnosis not present

## 2020-05-17 DIAGNOSIS — K219 Gastro-esophageal reflux disease without esophagitis: Secondary | ICD-10-CM | POA: Diagnosis not present

## 2020-05-17 DIAGNOSIS — T189XXA Foreign body of alimentary tract, part unspecified, initial encounter: Secondary | ICD-10-CM | POA: Insufficient documentation

## 2020-05-18 ENCOUNTER — Ambulatory Visit: Payer: Medicare HMO | Admitting: Cardiology

## 2020-05-18 ENCOUNTER — Encounter: Payer: Self-pay | Admitting: *Deleted

## 2020-05-18 ENCOUNTER — Encounter: Payer: Self-pay | Admitting: Cardiology

## 2020-05-18 VITALS — BP 104/62 | HR 80 | Ht 73.0 in | Wt 213.8 lb

## 2020-05-18 DIAGNOSIS — I471 Supraventricular tachycardia: Secondary | ICD-10-CM | POA: Diagnosis not present

## 2020-05-18 DIAGNOSIS — I480 Paroxysmal atrial fibrillation: Secondary | ICD-10-CM | POA: Diagnosis not present

## 2020-05-18 DIAGNOSIS — R0609 Other forms of dyspnea: Secondary | ICD-10-CM

## 2020-05-18 DIAGNOSIS — R600 Localized edema: Secondary | ICD-10-CM | POA: Diagnosis not present

## 2020-05-18 DIAGNOSIS — R0600 Dyspnea, unspecified: Secondary | ICD-10-CM | POA: Diagnosis not present

## 2020-05-18 DIAGNOSIS — E785 Hyperlipidemia, unspecified: Secondary | ICD-10-CM

## 2020-05-18 MED ORDER — APIXABAN 5 MG PO TABS: 5.0000 mg | ORAL_TABLET | Freq: Two times a day (BID) | ORAL | 3 refills | Status: DC

## 2020-05-18 MED ORDER — APIXABAN 5 MG PO TABS
5.0000 mg | ORAL_TABLET | Freq: Two times a day (BID) | ORAL | 3 refills | Status: DC
Start: 2020-05-18 — End: 2021-02-24

## 2020-05-18 MED ORDER — APIXABAN 5 MG PO TABS
5.0000 mg | ORAL_TABLET | Freq: Two times a day (BID) | ORAL | 0 refills | Status: DC
Start: 2020-05-18 — End: 2020-05-18

## 2020-05-18 NOTE — Progress Notes (Signed)
Cardiology Office Note:    Date:  05/18/2020   ID:  Thomas Dominguez, Thomas Dominguez 05/11/1946, MRN [id number]  PCP:  Thomas Pike, MD  Cardiologist:  No primary care provider on file.  Electrophysiologist:  None   Referring MD: Thomas Pike, MD   Chief complaint: AF  History of Present Illness:    Thomas Dominguez is a 74 y.o. male with a hx of atrial fibrillation, esophageal cancer status post esophagectomy, BPH, depression, degenerative disc disease who presents for follow-up.  He was referred by Dr. Owens Dominguez for evaluation of atrial fibrillation, initially seen on 03/18/2019.  He reports that he was diagnosed with atrial fibrillation in 2013.  He was previously seeing a cardiologist in [place], [place].  He had been alternating stress test and echo every other year.  Has been on propafenone and diltiazem since 2013.  States that he always feels it when he goes into AF.  Will occur every 3-4 days, can last up to 1 hour.  Feels short of breath when in AF.  Had back surgery in June 2020, has been improving.  States that walks 1.5 miles per day.  Has been having dyspnea with exertion but able to complete his walks.  He reports that he checks his BP at home, has always been normal.   TTE 04/02/2019 showed EF 55 to 37%, grade 2 diastolic dysfunction, normal RV function, no significant valvular disease.  14-day Zio patch showed no recurrent atrial fibrillation but did show 20 episodes of SVT, longest lasting 11 seconds.  Triggered events corresponded to sinus rhythm and rare PVCs.  Calcium score 0 on 03/12/2020.  Since last clinic visit, underwent bronchospy in October which was complicated by pneumothorax.  Biopsy was negative. 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  He denies any palpitations, chest pain, dyspnea, syncope.  Does report some lightheadedness with standing.  Has not felt like he has had any recurrence of his atrial  fibrillation.   Past Medical History:  Diagnosis Date  . Atrial fibrillation ([place]) 01/17/2019  . BPH (benign prostatic hyperplasia) 01/17/2019  . Degenerative disc disease, lumbar 01/17/2019  . Depression, major, single episode, complete remission ([place]) 01/17/2019  . Diverticulosis 01/17/2019  . Dysrhythmia   . Esophageal cancer ([place])   . GERD (gastroesophageal reflux disease)   . H/O small bowel obstruction 01/17/2019  . Hiatal hernia 01/17/2019  . Hypertension   . Pneumonia 11/2019  . SBO (small bowel obstruction) ([place]) 11/20/2019  . Wears glasses     Past Surgical History:  Procedure Laterality Date  . ABDOMINAL HERNIA REPAIR  2012, 2014, 2017   ventral  . BRONCHIAL BIOPSY  04/08/2020   Procedure: BRONCHIAL BIOPSIES;  Surgeon: Thomas Garfinkel, MD;  Location: [facility];  Service: Cardiopulmonary;;  . BRONCHIAL BRUSHINGS  04/08/2020   Procedure: BRONCHIAL BRUSHINGS;  Surgeon: Thomas Garfinkel, MD;  Location: [facility];  Service: Cardiopulmonary;;  . BRONCHIAL WASHINGS  04/08/2020   Procedure: BRONCHIAL WASHINGS;  Surgeon: Thomas Garfinkel, MD;  Location: [facility];  Service: Cardiopulmonary;;  . COLONOSCOPY  2019  . ESOPHAGECTOMY  1997   for esophageal cancer  . PARATHYROIDECTOMY Right 01/26/2020   Procedure: RIGHT INFERIOR PARATHYROIDECTOMY;  Surgeon: [name], MD;  Location: [facility];  Service: General;  Laterality: Right;  . SPINAL FUSION  2018, 2020   L2 L5 in 2018, L5-S1 2020  . VIDEO BRONCHOSCOPY N/A 04/08/2020   Procedure: VIDEO BRONCHOSCOPY WITH FLUORO;  Surgeon: Thomas Garfinkel, MD;  Location: [facility];  Service: Cardiopulmonary;  Laterality: N/A;    Current Medications: Current Meds  Medication Sig  . albuterol (VENTOLIN HFA) 108 (90 Base) MCG/ACT inhaler Inhale 2 puffs into the lungs every 6 (six) hours as needed. (Patient taking differently: Inhale 2 puffs into the lungs every 6 (six) hours as needed for wheezing or shortness of breath. )  . diltiazem  (CARDIZEM) 120 MG tablet TAKE 1 TABLET (120 MG TOTAL) BY MOUTH DAILY.  . famotidine (PEPCID) 20 MG tablet TAKE 1 TABLET EVERY DAY AT BEDTIME  . methocarbamol (ROBAXIN) 750 MG tablet TAKE 1 TABLET TWICE DAILY AS NEEDED FOR MUSCLE SPASM(S)  . metoCLOPramide (REGLAN) 5 MG tablet Take 1 tablet (5 mg total) by mouth 4 (four) times daily. 5 mg before 3 meals and at bedtime, as needed  . omeprazole (PRILOSEC) 40 MG capsule TAKE 1 CAPSULE TWICE DAILY (Patient taking differently: Take 40 mg by mouth in the morning and at bedtime. )  . propafenone (RYTHMOL) 225 MG tablet TAKE 1 TABLET (225 MG TOTAL) BY MOUTH EVERY 8 HOURS. (Patient taking differently: Take 225 mg by mouth every 8 (eight) hours. )  . tamsulosin (FLOMAX) 0.4 MG CAPS capsule TAKE 2 CAPSULES EVERY DAY (Patient taking differently: Take 0.4 mg by mouth in the morning and at bedtime. )  . traMADol (ULTRAM) 50 MG tablet TAKE 1 TABLET BY MOUTH EVERY 12 HOURS AS NEEDED FOR MODERATE PAIN. (Patient taking differently: Take 50 mg by mouth every 12 (twelve) hours as needed for moderate pain. )  . venlafaxine XR (EFFEXOR-XR) 150 MG 24 hr capsule TAKE 1 CAPSULE EVERY DAY WITH BREAKFAST (Patient taking differently: Take 150 mg by mouth daily with breakfast. )  . [DISCONTINUED] aspirin EC 81 MG tablet Take 81 mg by mouth daily.     Allergies:   Mobic [meloxicam] and Statins   Social History   Socioeconomic History  . Marital status: Married    Spouse name: Thomas Dominguez  . Number of children: 2  . Years of education: Not on file  . Highest education level: Not on file  Occupational History  . Occupation: retired    Fish farm manager: [employer]  Tobacco Use  . Smoking status: Never Smoker  . Smokeless tobacco: Never Used  Vaping Use  . Vaping Use: Never used  Substance and Sexual Activity  . Alcohol use: Yes    Alcohol/week: 7.0 standard drinks    Types: 7 Cans of beer per week  . Drug use: Never  . Sexual activity: Not on file  Other Topics Concern  .  Not on file  Social History Narrative  . Not on file   Social Determinants of Health   Financial Resource Strain:   . Difficulty of Paying Living Expenses: Not on file  Food Insecurity: No Food Insecurity  . Worried About Charity fundraiser in the Last Year: Never true  . Ran Out of Food in the Last Year: Never true  Transportation Needs: No Transportation Needs  . Lack of Transportation (Medical): No  . Lack of Transportation (Non-Medical): No  Physical Activity:   . Days of Exercise per Week: Not on file  . Minutes of Exercise per Session: Not on file  Stress:   . Feeling of Stress : Not on file  Social Connections:   . Frequency of Communication with Friends and Family: Not on file  . Frequency of Social Gatherings with Friends and Family: Not on file  .  Attends Religious Services: Not on file  . Active Member of Clubs or Organizations: Not on file  . Attends Archivist Meetings: Not on file  . Marital Status: Not on file     Family History: The patient's family history includes Emphysema in his father; GER disease in his daughter, mother, and son; Lung cancer in his sister. There is no history of Colon cancer or Stomach cancer.  ROS:   Please see the history of present illness.     All other systems reviewed and are negative.  EKGs/Labs/Other Studies Reviewed:    The following studies were reviewed today:   EKG:  EKG is ordered today.  The ekg ordered 05/18/20 demonstrates NSR, rate 70, no ST/T changes  Cardiac monitor 04/22/19:  No atrial fibrillation seen. 20 episodes of SVT, longest lasting 11 seconds.   13 days of data recorded on Zio monitor. Patient had a min HR of 59 bpm, max HR of 174 bpm, and avg HR of 87 bpm. Predominant underlying rhythm was Sinus Rhythm. No VT, atrial fibrillation, high degree block, or pauses noted. Isolated atrial and ventricular ectopy was rare (<1%). 20 episodes of of SVT, longest lasting 11 seconds (rate 144 bpm); fastest  was rate 174 bpm, lasting 12 beats.There were 8 triggered events, which corresponded to sinus rhythm and rare PVCs. No significant arrhythmias detected.  TTE 04/02/19:  1. Left ventricular ejection fraction, by visual estimation, is 55 to 60%. The left ventricle has normal function. There is no left ventricular hypertrophy.  2. Left ventricular diastolic Doppler parameters are consistent with pseudonormalization pattern of LV diastolic filling.  3. Global right ventricle has normal systolic function.The right ventricular size is mildly enlarged. No increase in right ventricular wall thickness.  4. Left atrial size was normal.  5. Right atrial size was normal.  6. The mitral valve is normal in structure. No evidence of mitral valve regurgitation. No evidence of mitral stenosis.  7. The tricuspid valve is normal in structure. Tricuspid valve regurgitation is trivial.  8. The aortic valve is normal in structure. Aortic valve regurgitation was not visualized by color flow Doppler.  9. The pulmonic valve was normal in structure. Pulmonic valve regurgitation is not visualized by color flow Doppler. 10. Normal pulmonary artery systolic pressure. 11. The atrial septum is grossly normal.  Recent Labs: 06/10/2019: BNP 20.7 04/02/2020: ALT 18 04/09/2020: BUN 17; Creatinine, Ser 1.02; Hemoglobin 14.1; Platelets 184; Potassium 4.5; Sodium 137  Recent Lipid Panel    Component Value Date/Time   CHOL 186 12/26/2019 0946   TRIG 176 (H) 12/26/2019 0946   HDL 67 12/26/2019 0946   CHOLHDL 2.8 12/26/2019 0946   LDLCALC 89 12/26/2019 0946    Physical Exam:    VS:  BP 104/62 (BP Location: Left Arm, Patient Position: Sitting)   Pulse 80   Ht 6\' 1"  (1.854 m)   Wt 213 lb 12.8 oz (97 kg)   SpO2 96%   BMI 28.21 kg/m     Wt Readings from Last 3 Encounters:  05/18/20 213 lb 12.8 oz (97 kg)  05/10/20 210 lb 8 oz (95.5 kg)  05/07/20 209 lb (94.8 kg)     GEN:  Well nourished, well developed in no acute  distress HEENT: Normal NECK: No JVD; No carotid bruits LYMPHATICS: No lymphadenopathy CARDIAC: RRR, no murmurs, rubs, gallops RESPIRATORY:  Clear to auscultation without rales, wheezing or rhonchi  ABDOMEN: Soft, non-tender, non-distended MUSCULOSKELETAL: 1+ LE edema; No deformity  SKIN: Warm and  dry NEUROLOGIC:  Alert and oriented x 3 PSYCHIATRIC:  Normal affect   ASSESSMENT:    1. PAF (paroxysmal atrial fibrillation) ([place])   2. DOE (dyspnea on exertion)   3. Lower extremity edema   4. Hyperlipidemia, unspecified hyperlipidemia type   5. SVT (supraventricular tachycardia) ([facility])    PLAN:     Atrial fibrillation: paroxysmal, on propafenone 225 mg 3 times daily and diltiazem 120 mg daily.  CHADS-VASc 1 given age.  Cardiac monitor 04/22/2019 shows no evidence of recurrent A. fib TTE on 04/02/2019 showed normal biventricular function, no significant valvular disease - Discussed with patient that with CHADS-VASc 1 could consider anticoagulation.  In addition, he will be turning 75 next year and CHA2DS2-VASc will be 2, and anticoagulation would be recommended.  He is agreeable to starting Eliquis 5 mg twice daily - Continue propafenone and diltiazem  LE edema: Unclear cause.  Will check BNP.  If elevated, will recheck echocardiogram.  Check CMP to evaluate for hypoalbuminemia.  SVT: Having intermittent episodes on monitor, longest lasting up to 11 seconds.  Reports palpitations have resolved.  Continue diltiazem and propafenone as above  Dyspnea: Following with pulmonology, CT chest 11/30/2019 with findings consistent with sequela of prior inflammation.  PFTs on 12/12/2019 with findings suggestive of restriction but normal lung volumes.  Diastolic dysfunction could be contributing, though BNP checked at last clinic visit and was unremarkable (21).  Underwent bronchoscopy with biopsy, which was unremarkable.  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  He reports dyspnea has improved  Hyperlipidemia: LDL 101 on 06/10/2019.  Started on atorvastatin but unable to tolerate due to myalgias.  Calcium score 0 on 03/12/2020.  Can hold off on statin.  RTC in 6 months  Medication Adjustments/Labs and Tests Ordered: Current medicines are reviewed at length with the patient today.  Concerns regarding medicines are outlined above.  Orders Placed This Encounter  Procedures  . Comprehensive metabolic panel  . CBC  . Brain natriuretic peptide  . EKG 12-Lead   Meds ordered this encounter  Medications  . DISCONTD: apixaban (ELIQUIS) 5 MG TABS tablet    Sig: Take 1 tablet (5 mg total) by mouth 2 (two) times daily.    Dispense:  60 tablet    Refill:  3  . DISCONTD: apixaban (ELIQUIS) 5 MG TABS tablet    Sig: Take 1 tablet (5 mg total) by mouth 2 (two) times daily.    Dispense:  60 tablet    Refill:  0  . apixaban (ELIQUIS) 5 MG TABS tablet    Sig: Take 1 tablet (5 mg total) by mouth 2 (two) times daily.    Dispense:  180 tablet    Refill:  3    D/c previous rx-updated to 90 day supply    Patient Instructions  Medication Instructions:  START Eliquis 5 mg two times daily  *If you need a refill on your cardiac medications before your next appointment, please call your pharmacy*   Lab Work: CMET, CBC, BNP today  If you have labs (blood work) drawn today and your tests are completely normal, you will receive your results only by: Marland Kitchen MyChart Message (if you have MyChart) OR . A paper copy in the mail If you have any lab test that is abnormal or we need to change your treatment, we will call you to review the results.   Follow-Up: At [facility], you and your health needs are  our priority.  As part of our continuing mission to provide you with exceptional heart care, we have created designated Provider Care Teams.  These Care Teams include your primary Cardiologist (physician) and Advanced Practice  Providers (APPs -  Physician Assistants and Nurse Practitioners) who all work together to provide you with the care you need, when you need it.  We recommend signing up for the patient portal called "MyChart".  Sign up information is provided on this After Visit Summary.  MyChart is used to connect with patients for Virtual Visits (Telemedicine).  Patients are able to view lab/test results, encounter notes, upcoming appointments, etc.  Non-urgent messages can be sent to your provider as well.   To learn more about what you can do with MyChart, go to [email or web address].    Your next appointment:   6 month(s)  The format for your next appointment:   In Person  Provider:   Oswaldo Milian, MD       Signed, Donato Heinz, MD  05/18/2020 4:54 PM    [place]

## 2020-05-18 NOTE — Patient Instructions (Signed)
Medication Instructions:  START Eliquis 5 mg two times daily  *If you need a refill on your cardiac medications before your next appointment, please call your pharmacy*   Lab Work: CMET, CBC, BNP today  If you have labs (blood work) drawn today and your tests are completely normal, you will receive your results only by: Marland Kitchen MyChart Message (if you have MyChart) OR . A paper copy in the mail If you have any lab test that is abnormal or we need to change your treatment, we will call you to review the results.   Follow-Up: At [facility], you and your health needs are our priority.  As part of our continuing mission to provide you with exceptional heart care, we have created designated Provider Care Teams.  These Care Teams include your primary Cardiologist (physician) and Advanced Practice Providers (APPs -  Physician Assistants and Nurse Practitioners) who all work together to provide you with the care you need, when you need it.  We recommend signing up for the patient portal called "MyChart".  Sign up information is provided on this After Visit Summary.  MyChart is used to connect with patients for Virtual Visits (Telemedicine).  Patients are able to view lab/test results, encounter notes, upcoming appointments, etc.  Non-urgent messages can be sent to your provider as well.   To learn more about what you can do with MyChart, go to [email or web address].    Your next appointment:   6 month(s)  The format for your next appointment:   In Person  Provider:   Oswaldo Milian, MD

## 2020-05-19 LAB — COMPREHENSIVE METABOLIC PANEL
ALT: 25 IU/L (ref 0–44)
AST: 22 IU/L (ref 0–40)
Albumin/Globulin Ratio: 1.7 (ref 1.2–2.2)
Albumin: 4.3 g/dL (ref 3.7–4.7)
Alkaline Phosphatase: 132 IU/L — ABNORMAL HIGH (ref 44–121)
BUN/Creatinine Ratio: 14 (ref 10–24)
BUN: 12 mg/dL (ref 8–27)
Bilirubin Total: 0.3 mg/dL (ref 0.0–1.2)
CO2: 24 mmol/L (ref 20–29)
Calcium: 9.2 mg/dL (ref 8.6–10.2)
Chloride: 100 mmol/L (ref 96–106)
Creatinine, Ser: 0.86 mg/dL (ref 0.76–1.27)
GFR calc Af Amer: 99 mL/min/{1.73_m2} (ref >59)
GFR calc non Af Amer: 85 mL/min/{1.73_m2} (ref >59)
Globulin, Total: 2.6 g/dL (ref 1.5–4.5)
Glucose: 99 mg/dL (ref 65–99)
Potassium: 4.6 mmol/L (ref 3.5–5.2)
Sodium: 138 mmol/L (ref 134–144)
Total Protein: 6.9 g/dL (ref 6.0–8.5)

## 2020-05-19 LAB — CBC
Hematocrit: 44.2 % (ref 37.5–51.0)
Hemoglobin: 15.6 g/dL (ref 13.0–17.7)
MCH: 32.8 pg (ref 26.6–33.0)
MCHC: 35.3 g/dL (ref 31.5–35.7)
MCV: 93 fL (ref 79–97)
Platelets: 208 10*3/uL (ref 150–450)
RBC: 4.75 x10E6/uL (ref 4.14–5.80)
RDW: 12.2 % (ref 11.6–15.4)
WBC: 5.8 10*3/uL (ref 3.4–10.8)

## 2020-05-19 LAB — BRAIN NATRIURETIC PEPTIDE: BNP: 43.1 pg/mL (ref 0.0–100.0)

## 2020-05-22 LAB — ACID FAST CULTURE WITH REFLEXED SENSITIVITIES (MYCOBACTERIA): Acid Fast Culture: NEGATIVE

## 2020-05-26 ENCOUNTER — Other Ambulatory Visit: Payer: Self-pay | Admitting: Family Medicine

## 2020-05-31 ENCOUNTER — Other Ambulatory Visit: Payer: Self-pay

## 2020-05-31 DIAGNOSIS — Z9889 Other specified postprocedural states: Secondary | ICD-10-CM

## 2020-05-31 DIAGNOSIS — K56609 Unspecified intestinal obstruction, unspecified as to partial versus complete obstruction: Secondary | ICD-10-CM

## 2020-05-31 DIAGNOSIS — Z9049 Acquired absence of other specified parts of digestive tract: Secondary | ICD-10-CM

## 2020-06-02 ENCOUNTER — Telehealth: Payer: Self-pay

## 2020-06-02 NOTE — Telephone Encounter (Addendum)
[facility] Pre-operative Risk Assessment     Request for surgical clearance:     Endoscopy Procedure  What type of surgery is being performed?   EGD When is this surgery scheduled?     06/17/20  What type of clearance is required ?   Pharmacy  Are there any medications that need to be held prior to surgery and how long? Eliquis to be held for 2 days prior  Practice name and name of physician performing surgery?      [facility]  What is your office phone and fax number?      Phone- [phone]  [phone]  Anesthesia type (None, local, MAC, general) ?       MAC

## 2020-06-02 NOTE — Telephone Encounter (Signed)
I have sent a pharmacy clearance request to the Cardiologist. He was not on Eliquis when this was scheduled. Glad you saw this. I do not see what would make this be a hospital case. What are you seeing?

## 2020-06-02 NOTE — Telephone Encounter (Signed)
Pharmacy, can you please comment on how long Eliquis should be held for upcoming EGD?  Thank you!

## 2020-06-02 NOTE — Telephone Encounter (Signed)
Has the request for the Eliquis hold been sent to this patient's MD?  Should this repeat EGD be scheduled at the hospital (per the patient's last MyChart message)-please obtain clarification???

## 2020-06-02 NOTE — Telephone Encounter (Signed)
   Primary Cardiologist: Dr. Gardiner Rhyme  Chart reviewed as part of pre-operative protocol coverage. Patient has EGD scheduled for 06/17/2020 and we were asked to give our recommendations for holding Eliquis.   Per Pharmacy and office protocol, patient can hold Eliquis for 2 days prior to procedure. This should be restarted as soon as able following procedure.   I will route this recommendation to the requesting party via Epic fax function and remove from pre-op pool.  Please call with questions.  Darreld Mclean, PA-C 06/02/2020, 3:23 PM

## 2020-06-02 NOTE — Telephone Encounter (Signed)
Patient with diagnosis of ATRIAL FIBRILLATION on ELIQUIS for anticoagulation.    Procedure: EGD Date of procedure: 06/17/2020    CHA2DS2-VASc Score = 2  This indicates a 2.2% annual risk of stroke. The patient's score is based upon: CHF History: 0 HTN History: 1 Diabetes History: 0 Stroke History: 0 Vascular Disease History: 0 Age Score: 1 Gender Score: 0   CrCl >100MG /ML Platelet count = 208  Per office protocol, patient can hold ELIQUIS for 2 days prior to procedure.

## 2020-06-03 ENCOUNTER — Other Ambulatory Visit: Payer: Self-pay

## 2020-06-03 DIAGNOSIS — Z9049 Acquired absence of other specified parts of digestive tract: Secondary | ICD-10-CM

## 2020-06-03 DIAGNOSIS — Z9889 Other specified postprocedural states: Secondary | ICD-10-CM

## 2020-06-03 DIAGNOSIS — K2270 Barrett's esophagus without dysplasia: Secondary | ICD-10-CM

## 2020-06-03 NOTE — Telephone Encounter (Signed)
See "patient message" on 05/17/2020. Patient states that Dr.Nangiam told him the EGD should be done at the hospital.

## 2020-06-03 NOTE — Telephone Encounter (Signed)
Everything has been moved.

## 2020-06-04 ENCOUNTER — Institutional Professional Consult (permissible substitution) (INDEPENDENT_AMBULATORY_CARE_PROVIDER_SITE_OTHER): Payer: Medicare HMO | Admitting: Thoracic Surgery (Cardiothoracic Vascular Surgery)

## 2020-06-04 ENCOUNTER — Encounter: Payer: Self-pay | Admitting: *Deleted

## 2020-06-04 ENCOUNTER — Other Ambulatory Visit: Payer: Self-pay

## 2020-06-04 ENCOUNTER — Encounter: Payer: Self-pay | Admitting: Thoracic Surgery (Cardiothoracic Vascular Surgery)

## 2020-06-04 VITALS — BP 104/69 | HR 91 | Resp 20 | Ht 73.0 in | Wt 209.0 lb

## 2020-06-04 DIAGNOSIS — K449 Diaphragmatic hernia without obstruction or gangrene: Secondary | ICD-10-CM | POA: Diagnosis not present

## 2020-06-04 NOTE — Progress Notes (Signed)
[address]       [place] [address]             [id number]                    Jatinder L. [place] Record #[id number] Date of Birth: Dec 15, 1945  Referring: Mauri Pole, MD Primary Care: Benay Pike, MD Primary Cardiologist: No primary care provider on file.  Chief Complaint:    Chief Complaint  Patient presents with  . Hiatal Hernia    Surgical consult, Upper GI 05/07/20, HX of esophageal cancer    History of Present Illness:    Thomas Dominguez 74 y.o. male referred for evaluation of a recurrent hiatal hernia.  He originally underwent an esophagectomy at [facility] in 1997 for an early stage esophageal cancer.  He subsequently underwent a paraesophageal hernia repair at [facility] in 2014.  Per his report they originally attempted a robotic assisted paraesophageal hernia repair, but encountered a bowel injury upon entry, and converted to an open paraesophageal hernia repair.  Additionally he has had a retroperitoneal exposure for spinal surgery on the left side, and a ventral hernia repair with mesh on 2 separate occasions.  In regards to his symptoms, he mostly complains of worsening dyspnea.  He is followed by pulmonologist for concerns for interstitial lung disease, which is thought to be due to aspiration pneumonia.  In regards to his digestive symptoms, over the last several years she is only been able to eat small meals he has very slow passage of food through his gastric conduit.  He is unable to eat after 4 PM due to concern for regurgitation, and occasionally notes retained food 2 days after a meal.  He underwent an esophagram which demonstrated very slow transit through his stomach.  He states that his symptoms have gotten much worse over the last year.   Despite having these symptoms, he has been gaining weight over the past several months.  Zubrod Score: At the time of surgery this patient's most appropriate activity status/level  should be described as: [x]     0    Normal activity, no symptoms []     1    Restricted in physical strenuous activity but ambulatory, able to do out light work []     2    Ambulatory and capable of self care, unable to do work activities, up and about               >50 % of waking hours                              []     3    Only limited self care, in bed greater than 50% of waking hours []     4    Completely disabled, no self care, confined to bed or chair []     5    Moribund   Past Medical History:  Diagnosis Date  . Atrial fibrillation ([place]) 01/17/2019  . BPH (benign prostatic hyperplasia) 01/17/2019  . Degenerative disc disease, lumbar 01/17/2019  . Depression, major, single episode, complete remission ([place]) 01/17/2019  . Diverticulosis 01/17/2019  . Dysrhythmia   . Esophageal cancer ([place])   . GERD (gastroesophageal reflux disease)   . H/O small bowel obstruction 01/17/2019  . Hiatal hernia 01/17/2019  . Hypertension   . Pneumonia 11/2019  . SBO (small bowel  obstruction) ([place]) 11/20/2019  . Wears glasses     Past Surgical History:  Procedure Laterality Date  . ABDOMINAL HERNIA REPAIR  2012, 2014, 2017   ventral  . BRONCHIAL BIOPSY  04/08/2020   Procedure: BRONCHIAL BIOPSIES;  Surgeon: Marshell Garfinkel, MD;  Location: [facility];  Service: Cardiopulmonary;;  . BRONCHIAL BRUSHINGS  04/08/2020   Procedure: BRONCHIAL BRUSHINGS;  Surgeon: Marshell Garfinkel, MD;  Location: [facility];  Service: Cardiopulmonary;;  . BRONCHIAL WASHINGS  04/08/2020   Procedure: BRONCHIAL WASHINGS;  Surgeon: Marshell Garfinkel, MD;  Location: [facility];  Service: Cardiopulmonary;;  . COLONOSCOPY  2019  . ESOPHAGECTOMY  1997   for esophageal cancer  . PARATHYROIDECTOMY Right 01/26/2020   Procedure: RIGHT INFERIOR PARATHYROIDECTOMY;  Surgeon: [name], MD;  Location: [facility];  Service: General;  Laterality: Right;  . SPINAL FUSION  2018, 2020   L2 L5 in 2018, L5-S1 2020  . VIDEO BRONCHOSCOPY N/A  04/08/2020   Procedure: VIDEO BRONCHOSCOPY WITH FLUORO;  Surgeon: Marshell Garfinkel, MD;  Location: [facility];  Service: Cardiopulmonary;  Laterality: N/A;    Family History  Problem Relation Age of Onset  . Emphysema Father        smoker  . GER disease Daughter   . GER disease Son   . GER disease Mother   . Lung cancer Sister        never smoked  . Colon cancer Neg Hx   . Stomach cancer Neg Hx      Social History   Tobacco Use  Smoking Status Never Smoker  Smokeless Tobacco Never Used    Social History   Substance and Sexual Activity  Alcohol Use Yes  . Alcohol/week: 7.0 standard drinks  . Types: 7 Cans of beer per week     Allergies  Allergen Reactions  . Mobic [Meloxicam] Other (See Comments)    SOB and muscle pain  . Statins Other (See Comments)    Muscle pain    Current Outpatient Medications  Medication Sig Dispense Refill  . albuterol (VENTOLIN HFA) 108 (90 Base) MCG/ACT inhaler Inhale 2 puffs into the lungs every 6 (six) hours as needed. (Patient taking differently: Inhale 2 puffs into the lungs every 6 (six) hours as needed for wheezing or shortness of breath.) 18 g 5  . apixaban (ELIQUIS) 5 MG TABS tablet Take 1 tablet (5 mg total) by mouth 2 (two) times daily. 180 tablet 3  . diltiazem (CARDIZEM) 120 MG tablet TAKE 1 TABLET (120 MG TOTAL) BY MOUTH DAILY. 90 tablet 3  . famotidine (PEPCID) 20 MG tablet TAKE 1 TABLET EVERY DAY AT BEDTIME 90 tablet 3  . methocarbamol (ROBAXIN) 750 MG tablet TAKE 1 TABLET TWICE DAILY AS NEEDED FOR MUSCLE SPASM(S) 60 tablet 4  . omeprazole (PRILOSEC) 40 MG capsule TAKE 1 CAPSULE TWICE DAILY (Patient taking differently: Take 40 mg by mouth in the morning and at bedtime.) 180 capsule 3  . propafenone (RYTHMOL) 225 MG tablet TAKE 1 TABLET (225 MG TOTAL) BY MOUTH EVERY 8 HOURS. (Patient taking differently: Take 225 mg by mouth every 8 (eight) hours.) 270 tablet 3  . tamsulosin (FLOMAX) 0.4 MG CAPS capsule Take 1 capsule (0.4 mg  total) by mouth in the morning and at bedtime. 180 capsule 3  . traMADol (ULTRAM) 50 MG tablet TAKE 1 TABLET BY MOUTH EVERY 12 HOURS AS NEEDED FOR MODERATE PAIN. (Patient taking differently: Take 50 mg by mouth every 12 (twelve) hours as needed for moderate pain.) 60 tablet 2  .  venlafaxine XR (EFFEXOR-XR) 150 MG 24 hr capsule TAKE 1 CAPSULE EVERY DAY WITH BREAKFAST (Patient taking differently: Take 150 mg by mouth daily with breakfast.) 90 capsule 3   No current facility-administered medications for this visit.    Review of Systems  Constitutional: Negative.  Negative for weight loss.  Respiratory: Positive for shortness of breath.   Cardiovascular: Negative.   Gastrointestinal: Positive for abdominal pain, nausea and vomiting.  Neurological: Negative.      PHYSICAL EXAMINATION: BP 104/69   Pulse 91   Resp 20   Ht 6\' 1"  (1.854 m)   Wt 209 lb (94.8 kg)   SpO2 96% Comment: RA with mask on  BMI 27.57 kg/m  Physical Exam Constitutional:      General: He is not in acute distress.    Appearance: Normal appearance. He is normal weight. He is not ill-appearing.  Eyes:     Extraocular Movements: Extraocular movements intact.  Cardiovascular:     Rate and Rhythm: Normal rate.  Pulmonary:     Effort: Pulmonary effort is normal. No respiratory distress.  Abdominal:    Musculoskeletal:     Cervical back: Normal range of motion.  Neurological:     Mental Status: He is alert.     Diagnostic Studies & Laboratory data:    CT Scan: Personally reviewed his CT scan from March 12, 2020.  He does have a very large hiatal hernia with some portions of the large bowel and pancreas.  He has several groundglass opacities in his lung.  EGD/EUS: - An esophago-gastric anastomosis was found in the middle third of the esophagus, 20cm from incissors. No evidence of recurrent Barrett's esophagus. Regular Z-line at esophagogastric anastomosis. Retained secretions in the esophagus with no  motility or visible peristalsis. Findings: - A large amount of a phytobezoar was found in the entire examined stomach. Scope could not be extended into the duodenum. Cannot exclude gastric outlet obstruction.      I have independently reviewed the above radiology studies  and reviewed the findings with the patient.   Recent Lab Findings: Lab Results  Component Value Date   WBC 5.8 05/18/2020   HGB 15.6 05/18/2020   HCT 44.2 05/18/2020   PLT 208 05/18/2020   GLUCOSE 99 05/18/2020   CHOL 186 12/26/2019   TRIG 176 (H) 12/26/2019   HDL 67 12/26/2019   LDLCALC 89 12/26/2019   ALT 25 05/18/2020   AST 22 05/18/2020   NA 138 05/18/2020   K 4.6 05/18/2020   CL 100 05/18/2020   CREATININE 0.86 05/18/2020   BUN 12 05/18/2020   CO2 24 05/18/2020   INR 0.9 04/02/2020      Assessment / Plan:   74 year old male with a history of esophagectomy 1997 for early stage esophageal cancer, paraesophageal hernia repair in 2014, ventral hernia repair x2, who is also on Eliquis for atrial fibrillation.  Personally reviewed the images and discussed potential options with the patient.  He has a very complicated past surgical history in addition to having recurrent small bowel obstructions requiring admission.  I am concerned that due to to the scar tissue from all of his previous operations, and his current medical problems he would be a very high risk patient for surgical repair of his hiatal hernia.  Will need to obtain records from his hiatal hernia repair in regards to what was done previously, but he likely will require a very complex repair, and reconstruction of his hiatus.    I  spent 40 minutes with  the patient face to face and greater then 50% of the time was spent in counseling and coordination of care.    Lajuana Matte 06/04/2020 4:41 PM

## 2020-06-07 ENCOUNTER — Telehealth: Payer: Self-pay

## 2020-06-07 NOTE — Telephone Encounter (Signed)
Contacted the patient. He is in agreement with the plan of care.  I left a message for Alyssa, the RN of Dr Kipp Brood. Inquiring if I may be of any assistance in the referral process to Dr Erie Noe.

## 2020-06-07 NOTE — Telephone Encounter (Signed)
-----   Message from Mauri Pole, MD sent at 06/07/2020  9:52 AM EST ----- Thank you Harrell for reviewing the case and letting me know.  Margarette Asal ----- Message ----- From: Lajuana Matte, MD Sent: 06/04/2020   4:59 PM EST To: Mauri Pole, MD  Dr. Silverio Decamp,  Thank you for sending Mr. Habermann.  He has a very interesting and complex problem.  Given his extensive past surgical history and his hiatus, as well as a multiple ventral hernia repairs I think that he may be best served with a referral to [facility].  I have spoken with Dr. Erie Noe, and he has agreed to see him in follow-up if that is okay with you.  My concern is that in addition to a complex hiatal hernia repair, he likely will also require an abdominal wall reconstruction.  Thanks again for the consult, please let me know your thoughts, Tera Mater

## 2020-06-07 NOTE — Telephone Encounter (Signed)
Referral is being handled by Dr Kipp Brood and his staff.

## 2020-06-08 ENCOUNTER — Other Ambulatory Visit (HOSPITAL_COMMUNITY)
Admission: RE | Admit: 2020-06-08 | Discharge: 2020-06-08 | Disposition: A | Discharge status: Home / Self Care | Payer: Medicare HMO | Source: Ambulatory Visit | Attending: Gastroenterology | Admitting: Gastroenterology

## 2020-06-08 DIAGNOSIS — Z01812 Encounter for preprocedural laboratory examination: Secondary | ICD-10-CM | POA: Insufficient documentation

## 2020-06-08 DIAGNOSIS — Z20822 Contact with and (suspected) exposure to covid-19: Secondary | ICD-10-CM | POA: Diagnosis not present

## 2020-06-08 LAB — SARS CORONAVIRUS 2 (TAT 6-24 HRS): SARS Coronavirus 2: NEGATIVE

## 2020-06-09 ENCOUNTER — Other Ambulatory Visit: Payer: Self-pay

## 2020-06-10 ENCOUNTER — Encounter: Payer: Self-pay | Admitting: Internal Medicine

## 2020-06-10 ENCOUNTER — Other Ambulatory Visit: Payer: Self-pay

## 2020-06-10 DIAGNOSIS — K2270 Barrett's esophagus without dysplasia: Secondary | ICD-10-CM

## 2020-06-10 DIAGNOSIS — K219 Gastro-esophageal reflux disease without esophagitis: Secondary | ICD-10-CM

## 2020-06-11 ENCOUNTER — Other Ambulatory Visit: Payer: Self-pay

## 2020-06-11 ENCOUNTER — Ambulatory Visit (HOSPITAL_COMMUNITY)
Admission: RE | Admit: 2020-06-11 | Discharge: 2020-06-11 | Disposition: A | Discharge status: Home / Self Care | Payer: Medicare HMO | Attending: Gastroenterology | Admitting: Gastroenterology

## 2020-06-11 ENCOUNTER — Encounter (HOSPITAL_COMMUNITY)
Admission: RE | Disposition: A | Discharge status: Home / Self Care | Payer: Self-pay | Source: Home / Self Care | Attending: Gastroenterology

## 2020-06-11 ENCOUNTER — Telehealth: Payer: Self-pay

## 2020-06-11 ENCOUNTER — Encounter (HOSPITAL_COMMUNITY): Payer: Self-pay | Admitting: Gastroenterology

## 2020-06-11 ENCOUNTER — Ambulatory Visit (HOSPITAL_COMMUNITY): Payer: Medicare HMO | Admitting: Anesthesiology

## 2020-06-11 DIAGNOSIS — Z801 Family history of malignant neoplasm of trachea, bronchus and lung: Secondary | ICD-10-CM | POA: Diagnosis not present

## 2020-06-11 DIAGNOSIS — I10 Essential (primary) hypertension: Secondary | ICD-10-CM | POA: Insufficient documentation

## 2020-06-11 DIAGNOSIS — Z9049 Acquired absence of other specified parts of digestive tract: Secondary | ICD-10-CM | POA: Diagnosis not present

## 2020-06-11 DIAGNOSIS — Z79899 Other long term (current) drug therapy: Secondary | ICD-10-CM | POA: Diagnosis not present

## 2020-06-11 DIAGNOSIS — Z8379 Family history of other diseases of the digestive system: Secondary | ICD-10-CM | POA: Insufficient documentation

## 2020-06-11 DIAGNOSIS — R1013 Epigastric pain: Secondary | ICD-10-CM | POA: Insufficient documentation

## 2020-06-11 DIAGNOSIS — E785 Hyperlipidemia, unspecified: Secondary | ICD-10-CM | POA: Diagnosis not present

## 2020-06-11 DIAGNOSIS — K2270 Barrett's esophagus without dysplasia: Secondary | ICD-10-CM

## 2020-06-11 DIAGNOSIS — K449 Diaphragmatic hernia without obstruction or gangrene: Secondary | ICD-10-CM | POA: Diagnosis not present

## 2020-06-11 DIAGNOSIS — K3189 Other diseases of stomach and duodenum: Secondary | ICD-10-CM | POA: Diagnosis not present

## 2020-06-11 DIAGNOSIS — Z791 Long term (current) use of non-steroidal anti-inflammatories (NSAID): Secondary | ICD-10-CM | POA: Insufficient documentation

## 2020-06-11 DIAGNOSIS — Z7901 Long term (current) use of anticoagulants: Secondary | ICD-10-CM | POA: Diagnosis not present

## 2020-06-11 DIAGNOSIS — Z825 Family history of asthma and other chronic lower respiratory diseases: Secondary | ICD-10-CM | POA: Diagnosis not present

## 2020-06-11 DIAGNOSIS — Z8501 Personal history of malignant neoplasm of esophagus: Secondary | ICD-10-CM | POA: Diagnosis not present

## 2020-06-11 DIAGNOSIS — K311 Adult hypertrophic pyloric stenosis: Secondary | ICD-10-CM | POA: Diagnosis not present

## 2020-06-11 DIAGNOSIS — Z888 Allergy status to other drugs, medicaments and biological substances status: Secondary | ICD-10-CM | POA: Insufficient documentation

## 2020-06-11 DIAGNOSIS — K219 Gastro-esophageal reflux disease without esophagitis: Secondary | ICD-10-CM | POA: Insufficient documentation

## 2020-06-11 DIAGNOSIS — Z9889 Other specified postprocedural states: Secondary | ICD-10-CM | POA: Diagnosis not present

## 2020-06-11 DIAGNOSIS — I4891 Unspecified atrial fibrillation: Secondary | ICD-10-CM | POA: Insufficient documentation

## 2020-06-11 HISTORY — PX: ESOPHAGOGASTRODUODENOSCOPY (EGD) WITH PROPOFOL: SHX5813

## 2020-06-11 SURGERY — ESOPHAGOGASTRODUODENOSCOPY (EGD) WITH PROPOFOL
Anesthesia: Monitor Anesthesia Care

## 2020-06-11 MED ORDER — LIDOCAINE 2% (20 MG/ML) 5 ML SYRINGE
INTRAMUSCULAR | Status: DC | PRN
  Administered 2020-06-11: 40 mg via INTRAVENOUS

## 2020-06-11 MED ORDER — SODIUM CHLORIDE 0.9 % IV SOLN: INTRAVENOUS | Status: DC

## 2020-06-11 MED ORDER — ONDANSETRON HCL 4 MG/2ML IJ SOLN
INTRAMUSCULAR | Status: DC | PRN
  Administered 2020-06-11: 4 mg via INTRAVENOUS

## 2020-06-11 MED ORDER — PROPOFOL 10 MG/ML IV BOLUS
INTRAVENOUS | Status: DC | PRN
  Administered 2020-06-11 (×7): 20 mg via INTRAVENOUS

## 2020-06-11 MED ORDER — DEXAMETHASONE SODIUM PHOSPHATE 10 MG/ML IJ SOLN
INTRAMUSCULAR | Status: DC | PRN
  Administered 2020-06-11: 4 mg via INTRAVENOUS

## 2020-06-11 MED ORDER — LACTATED RINGERS IV SOLN: INTRAVENOUS | Status: DC | PRN

## 2020-06-11 SURGICAL SUPPLY — 14 items

## 2020-06-11 NOTE — Anesthesia Procedure Notes (Signed)
Procedure Name: MAC Date/Time: 06/11/2020 1:50 PM Performed by: Cynda Familia, CRNA Pre-anesthesia Checklist: Patient identified, Emergency Drugs available, Suction available, Patient being monitored and Timeout performed Patient Re-evaluated:Patient Re-evaluated prior to induction Oxygen Delivery Method: Simple face mask Placement Confirmation: positive ETCO2 and breath sounds checked- equal and bilateral Dental Injury: Teeth and Oropharynx as per pre-operative assessment

## 2020-06-11 NOTE — Anesthesia Postprocedure Evaluation (Signed)
Anesthesia Post Note  Patient: Thomas Dominguez. [name]  Procedure(s) Performed: ESOPHAGOGASTRODUODENOSCOPY (EGD) WITH PROPOFOL (N/A )     Patient location during evaluation: PACU Anesthesia Type: MAC Level of consciousness: awake and alert Pain management: pain level controlled Vital Signs Assessment: post-procedure vital signs reviewed and stable Respiratory status: spontaneous breathing, nonlabored ventilation, respiratory function stable and patient connected to nasal cannula oxygen Cardiovascular status: stable and blood pressure returned to baseline Postop Assessment: no apparent nausea or vomiting Anesthetic complications: no   No complications documented.  Last Vitals:  Vitals:   06/11/20 1441 06/11/20 1450  BP: 109/60 124/73  Pulse: 68 74  Resp: 11 14  Temp:    SpO2: 98% 96%    Last Pain: There were no vitals filed for this visit.               [name] DANIEL

## 2020-06-11 NOTE — Op Note (Signed)
[name] Procedure Date: 06/11/2020 MRN: [id number] Attending MD: Mauri Pole , MD Date of Birth: 06/13/1946 CSN: [id number] Age: 74 Admit Type: Outpatient Procedure:                Upper GI endoscopy Indications:              Epigastric abdominal pain, Abnormal UGI series,                            Large hiatal hernia, gastric outlet obstruction Providers:                Mauri Pole, MD, Cleda Daub, RN, Elspeth Cho Tech., Technician, Glenis Smoker, CRNA Referring MD:              Medicines:                Monitored Anesthesia Care Complications:            No immediate complications. Estimated Blood Loss:     Estimated blood loss: none. Procedure:                Pre-Anesthesia Assessment:                           - Prior to the procedure, a History and Physical                            was performed, and patient medications and                            allergies were reviewed. The patient's tolerance of                            previous anesthesia was also reviewed. The risks                            and benefits of the procedure and the sedation                            options and risks were discussed with the patient.                            All questions were answered, and informed consent                            was obtained. Prior Anticoagulants: The patient                            last took Eliquis (apixaban) 2 days prior to the                            procedure. ASA Grade Assessment: III - A patient  with severe systemic disease. After reviewing the                            risks and benefits, the patient was deemed in                            satisfactory condition to undergo the procedure.                           After obtaining informed consent, the endoscope was                            passed under direct vision.  Throughout the                            procedure, the patient's blood pressure, pulse, and                            oxygen saturations were monitored continuously. The                            GIF-H190 ([id number]) Olympus gastroscope was                            introduced through the mouth, and advanced to the                            second part of duodenum. The upper GI endoscopy was                            accomplished without difficulty. The patient                            tolerated the procedure well. Scope In: Scope Out: Findings:      An esophago-gastric anastomosis was found at the esophageal anastomosis       at 20cm from incissors.      It was difficult to intubate /traverse through the pylorus due to       deformed anaatomy but mucosal lesions, stricture or ulceration.      The cardia and gastric fundus were normal on retroflexion.      Retained moderate fluid was found in the stomach, suctioned through       scope.      Normal mucosa was found in the stomach.      The examined duodenum was normal. Impression:               - An esophago-gastric anastomosis was found.                           - Acquired deformity in the pylorus.                           - Retained gastric fluid.                           -  Normal mucosa was found in the stomach.                           - Normal examined duodenum.                           - No specimens collected. Moderate Sedation:      Not Applicable - Patient had care per Anesthesia. Recommendation:           - Patient has a contact number available for                            emergencies. The signs and symptoms of potential                            delayed complications were discussed with the                            patient. Return to normal activities tomorrow.                            Written discharge instructions were provided to the                            patient.                           -  Resume previous diet.                           - Continue present medications.                           - Resume Eliquis (apixaban) at prior dose today.                            Refer to managing physician for further adjustment                            of therapy.                           - Follow up with CT surgery for hernia repair and                            to relieve the gastric outlet obstruction Procedure Code(s):        --- Professional ---                           [phone], Esophagogastroduodenoscopy, flexible,                            transoral; diagnostic, including collection of                            specimen(s) by brushing or washing, when performed                            (  separate procedure) Diagnosis Code(s):        --- Professional ---                           [id number], Other specified postprocedural states                           K31.89, Other diseases of stomach and duodenum                           R10.13, Epigastric pain                           R93.3, Abnormal findings on diagnostic imaging of                            other parts of digestive tract CPT copyright 2019 American Medical Association. All rights reserved. The codes documented in this report are preliminary and upon coder review may  be revised to meet current compliance requirements. Mauri Pole, MD 06/11/2020 2:36:21 PM This report has been signed electronically. Number of Addenda: 0

## 2020-06-11 NOTE — Discharge Instructions (Signed)
YOU HAD AN ENDOSCOPIC PROCEDURE TODAY AT THE [facility]:   Refer to the procedure report that was given to you for any specific questions about what was found during the examination.  If the procedure report does not answer your questions, please call your gastroenterologist to clarify.  If you requested that your care partner not be given the details of your procedure findings, then the procedure report has been included in a sealed envelope for you to review at your convenience later.  YOU SHOULD EXPECT: Some feelings of bloating in the abdomen. Passage of more gas than usual.  Walking can help get rid of the air that was put into your GI tract during the procedure and reduce the bloating. If you had a lower endoscopy (such as a colonoscopy or flexible sigmoidoscopy) you may notice spotting of blood in your stool or on the toilet paper. If you underwent a bowel prep for your procedure, you may not have a normal bowel movement for a few days.  Please Note:  You might notice some irritation and congestion in your nose or some drainage.  This is from the oxygen used during your procedure.  There is no need for concern and it should clear up in a day or so.  SYMPTOMS TO REPORT IMMEDIATELY:    Following upper endoscopy (EGD)  Vomiting of blood or coffee ground material  New chest pain or pain under the shoulder blades  Painful or persistently difficult swallowing  New shortness of breath  Fever of 100F or higher  Black, tarry-looking stools  For urgent or emergent issues, a gastroenterologist can be reached at any hour by calling [phone]. Do not use MyChart messaging for urgent concerns.    DIET:  We do recommend a small meal at first, but then you may proceed to your regular diet.  Drink plenty of fluids but you should avoid alcoholic beverages for 24 hours.  ACTIVITY:  You should plan to take it easy for the rest of today and you should NOT DRIVE or use heavy machinery  until tomorrow (because of the sedation medicines used during the test).    FOLLOW UP: Our staff will call the number listed on your records 48-72 hours following your procedure to check on you and address any questions or concerns that you may have regarding the information given to you following your procedure. If we do not reach you, we will leave a message.  We will attempt to reach you two times.  During this call, we will ask if you have developed any symptoms of COVID 19. If you develop any symptoms (ie: fever, flu-like symptoms, shortness of breath, cough etc.) before then, please call [phone].  If you test positive for Covid 19 in the 2 weeks post procedure, please call and report this information to us.    If any biopsies were taken you will be contacted by phone or by letter within the next 1-3 weeks.  Please call us at [phone] if you have not heard about the biopsies in 3 weeks.    SIGNATURES/CONFIDENTIALITY: You and/or your care partner have signed paperwork which will be entered into your electronic medical record.  These signatures attest to the fact that that the information above on your After Visit Summary has been reviewed and is understood.  Full responsibility of the confidentiality of this discharge information lies with you and/or your care-partner. 

## 2020-06-11 NOTE — Anesthesia Preprocedure Evaluation (Addendum)
Anesthesia Evaluation  Patient identified by MRN, date of birth, ID band Patient awake    Reviewed: Allergy & Precautions, NPO status , Patient's Chart, lab work & pertinent test results  History of Anesthesia Complications Negative for: history of anesthetic complications  Airway Mallampati: II  TM Distance: >3 FB     Dental no notable dental hx. (+) Dental Advisory Given   Pulmonary neg pulmonary ROS,    Pulmonary exam normal        Cardiovascular hypertension, Pt. on medications Normal cardiovascular exam+ dysrhythmias Atrial Fibrillation      Neuro/Psych PSYCHIATRIC DISORDERS Depression negative neurological ROS     GI/Hepatic Neg liver ROS, hiatal hernia, GERD  ,H/o esophageal cancer s/p esophagectomy (1997)   Endo/Other  Primary hyperparathyroidism s/p parathyroidectomy  Renal/GU negative Renal ROS  negative genitourinary   Musculoskeletal negative musculoskeletal ROS (+)   Abdominal   Peds  Hematology negative hematology ROS (+)   Anesthesia Other Findings   Reproductive/Obstetrics                            Anesthesia Physical  Anesthesia Plan  ASA: III  Anesthesia Plan: MAC   Post-op Pain Management:    Induction:   PONV Risk Score and Plan: 2 and Ondansetron and Dexamethasone  Airway Management Planned: Natural Airway and Simple Face Mask  Additional Equipment: None  Intra-op Plan:   Post-operative Plan:   Informed Consent: I have reviewed the patients History and Physical, chart, labs and discussed the procedure including the risks, benefits and alternatives for the proposed anesthesia with the patient or authorized representative who has indicated his/her understanding and acceptance.     Dental advisory given  Plan Discussed with: Anesthesiologist and CRNA  Anesthesia Plan Comments:        Anesthesia Quick Evaluation

## 2020-06-11 NOTE — H&P (Signed)
[facility] History and Physical   Primary Care Physician:  Benay Pike, MD   Reason for Procedure:  Abdominal pain, gastric outlet obstruction Plan:    EGD     HPI: Thomas Dominguez is a 74 y.o. male with large hiatal hernia and partial gastric outlet obstruction here for EGD to exclude any mucosal lesions or obstruction.  The risks and benefits as well as alternatives of endoscopic procedure(s) have been discussed and reviewed. All questions answered. The patient agrees to proceed.    Past Medical History:  Diagnosis Date  . Atrial fibrillation ([name]) 01/17/2019  . BPH (benign prostatic hyperplasia) 01/17/2019  . Degenerative disc disease, lumbar 01/17/2019  . Depression, major, single episode, complete remission ([name]) 01/17/2019  . Diverticulosis 01/17/2019  . Dysrhythmia   . Esophageal cancer ([name])   . GERD (gastroesophageal reflux disease)   . H/O small bowel obstruction 01/17/2019  . Hiatal hernia 01/17/2019  . Hypertension   . Pneumonia 11/2019  . SBO (small bowel obstruction) ([name]) 11/20/2019  . Wears glasses     Past Surgical History:  Procedure Laterality Date  . ABDOMINAL HERNIA REPAIR  2012, 2014, 2017   ventral  . BRONCHIAL BIOPSY  04/08/2020   Procedure: BRONCHIAL BIOPSIES;  Surgeon: Marshell Garfinkel, MD;  Location: [facility];  Service: Cardiopulmonary;;  . BRONCHIAL BRUSHINGS  04/08/2020   Procedure: BRONCHIAL BRUSHINGS;  Surgeon: Marshell Garfinkel, MD;  Location: [facility];  Service: Cardiopulmonary;;  . BRONCHIAL WASHINGS  04/08/2020   Procedure: BRONCHIAL WASHINGS;  Surgeon: Marshell Garfinkel, MD;  [facility]: [facility];  Service: Cardiopulmonary;;  . COLONOSCOPY  2019  . ESOPHAGECTOMY  1997   for esophageal cancer  . PARATHYROIDECTOMY Right 01/26/2020   Procedure: RIGHT INFERIOR PARATHYROIDECTOMY;  Surgeon: [name], MD;  Location: [facility];  Service: General;  Laterality: Right;  . SPINAL FUSION  2018, 2020   L2 L5 in 2018, L5-S1 2020   . VIDEO BRONCHOSCOPY N/A 04/08/2020   Procedure: VIDEO BRONCHOSCOPY WITH FLUORO;  Surgeon: Marshell Garfinkel, MD;  [facility];  Service: Cardiopulmonary;  Laterality: N/A;    Prior to Admission medications   Medication Sig Start Date End Date Taking? Authorizing Provider  albuterol (VENTOLIN HFA) 108 (90 Base) MCG/ACT inhaler Inhale 2 puffs into the lungs every 6 (six) hours as needed. Patient taking differently: Inhale 2 puffs into the lungs every 6 (six) hours as needed for wheezing or shortness of breath. 03/17/20  Yes Spero Geralds, MD  apixaban (ELIQUIS) 5 MG TABS tablet Take 1 tablet (5 mg total) by mouth 2 (two) times daily. 05/18/20  Yes Donato Heinz, MD  diltiazem (CARDIZEM) 120 MG tablet TAKE 1 TABLET (120 MG TOTAL) BY MOUTH DAILY. 12/19/19  Yes Benay Pike, MD  famotidine (PEPCID) 20 MG tablet TAKE 1 TABLET EVERY DAY AT BEDTIME Patient taking differently: Take 20 mg by mouth at bedtime. 04/16/20  Yes Tatsuya Okray, [name], MD  methocarbamol (ROBAXIN) 750 MG tablet TAKE 1 TABLET TWICE DAILY AS NEEDED FOR MUSCLE SPASM(S) Patient taking differently: Take 750 mg by mouth every 6 (six) hours as needed for muscle spasms. 04/21/20  Yes Benay Pike, MD  omeprazole (PRILOSEC) 40 MG capsule TAKE 1 CAPSULE TWICE DAILY Patient taking differently: Take 40 mg by mouth in the morning and at bedtime. 02/23/20  Yes [name], [name], MD  propafenone (RYTHMOL) 225 MG tablet TAKE 1 TABLET (225 MG TOTAL) BY MOUTH EVERY 8 HOURS. Patient taking differently: Take 225 mg by mouth 3 (  three) times daily. 01/13/20  Yes Donato Heinz, MD  tamsulosin (FLOMAX) 0.4 MG CAPS capsule Take 1 capsule (0.4 mg total) by mouth in the morning and at bedtime. 05/27/20  Yes Benay Pike, MD  traMADol (ULTRAM) 50 MG tablet TAKE 1 TABLET BY MOUTH EVERY 12 HOURS AS NEEDED FOR MODERATE PAIN. Patient taking differently: Take 50 mg by mouth every 12 (twelve) hours as needed for moderate pain.  02/25/20  Yes Benay Pike, MD  venlafaxine XR (EFFEXOR-XR) 150 MG 24 hr capsule TAKE 1 CAPSULE EVERY DAY WITH BREAKFAST Patient taking differently: Take 150 mg by mouth daily with breakfast. 11/18/19  Yes Benay Pike, MD    Current Facility-Administered Medications  Medication Dose Route Frequency Provider Last Rate Last Admin  . 0.9 %  sodium chloride infusion   Intravenous Continuous Mauri Pole, MD        Allergies as of 06/03/2020 - Review Complete 05/18/2020  Allergen Reaction Noted  . Mobic [meloxicam] Other (See Comments) 01/17/2019  . Statins Other (See Comments) 01/17/2019    Family History  Problem Relation Age of Onset  . Emphysema Father        smoker  . GER disease Daughter   . GER disease Son   . GER disease Mother   . Lung cancer Sister        never smoked  . Colon cancer Neg Hx   . Stomach cancer Neg Hx     Social History   Socioeconomic History  . Marital status: Married    Spouse name: [name]  . Number of children: 2  . Years of education: Not on file  . Highest education level: Not on file  Occupational History  . Occupation: retired    Fish farm manager: [employer]  Tobacco Use  . Smoking status: Never Smoker  . Smokeless tobacco: Never Used  Vaping Use  . Vaping Use: Never used  Substance and Sexual Activity  . Alcohol use: Yes    Alcohol/week: 7.0 standard drinks    Types: 7 Cans of beer per week  . Drug use: Never  . Sexual activity: Not on file  Other Topics Concern  . Not on file  Social History Narrative  . Not on file   Social Determinants of Health   Financial Resource Strain: Not on file  Food Insecurity: No Food Insecurity  . Worried About Charity fundraiser in the Last Year: Never true  . Ran Out of Food in the Last Year: Never true  Transportation Needs: No Transportation Needs  . Lack of Transportation (Medical): No  . Lack of Transportation (Non-Medical): No  Physical Activity: Not on file  Stress: Not on file   Social Connections: Not on file  Intimate Partner Violence: Not on file    Review of Systems:  All other review of systems negative except as mentioned in the HPI.  Physical Exam: Vital signs in last 24 hours: Temp:  [97.9 F (36.6 C)] 97.9 F (36.6 C) (12/17 1246) Pulse Rate:  [78] 78 (12/17 1246) Resp:  [12] 12 (12/17 1246) BP: (133)/(87) 133/87 (12/17 1246) SpO2:  [99 %] 99 % (12/17 1246) Weight:  [94.8 kg] 94.8 kg (12/17 1246)   General:   Alert, NAD Lungs:  Clear .   Heart:  Regular rate and rhythm Abdomen:  Soft, nontender and nondistended. Neuro/Psych:  Alert and cooperative. Normal mood and affect. A and O x 3   K. Denzil Magnuson , MD [phone]

## 2020-06-11 NOTE — Transfer of Care (Signed)
Immediate Anesthesia Transfer of Care Note  Patient: Thomas Dominguez  Procedure(s) Performed: ESOPHAGOGASTRODUODENOSCOPY (EGD) WITH PROPOFOL (N/A )  Patient Location: PACU and Endoscopy Unit  Anesthesia Type:MAC  Level of Consciousness: awake and alert   Airway & Oxygen Therapy: Patient Spontanous Breathing and Patient connected to face mask oxygen  Post-op Assessment: Report given to RN and Post -op Vital signs reviewed and stable  Post vital signs: Reviewed and stable  Last Vitals:  Vitals Value Taken Time  BP    Temp    Pulse    Resp    SpO2      Last Pain: There were no vitals filed for this visit.       Complications: No complications documented.

## 2020-06-11 NOTE — Telephone Encounter (Signed)
Office note, procedure notes and imaging faxed to Dr Richrd Prime for referral. Confirmed received.

## 2020-06-12 ENCOUNTER — Other Ambulatory Visit: Payer: Self-pay | Admitting: Family Medicine

## 2020-06-13 ENCOUNTER — Encounter (HOSPITAL_COMMUNITY): Payer: Self-pay | Admitting: Gastroenterology

## 2020-06-15 ENCOUNTER — Other Ambulatory Visit: Payer: Self-pay | Admitting: Family Medicine

## 2020-06-15 MED ORDER — TRAMADOL HCL 50 MG PO TABS: ORAL_TABLET | ORAL | 2 refills | Status: DC

## 2020-06-15 NOTE — Progress Notes (Signed)
Refilled for Dr Jeannine Kitten as his Imprivada is not working

## 2020-06-17 ENCOUNTER — Encounter: Payer: Medicare HMO | Admitting: Gastroenterology

## 2020-06-17 NOTE — Telephone Encounter (Signed)
Dr. Shearon Stalls please see message from patient  Just keeping your office up to date on my appointments. Dr. Silverio Decamp refered me to Dr. Kipp Brood then both Drs referred me to Dr. Erie Noe at [facility]. All in reference to my hiatal hernia causing breathing and digestion problems. I have appointments on Feb. 11, for pulmonary function tests, barium swallow, pre op anesthesia Dr. And Dr. Erie Noe. Thomas Dominguez

## 2020-06-27 ENCOUNTER — Other Ambulatory Visit: Payer: Self-pay | Admitting: Gastroenterology

## 2020-06-30 ENCOUNTER — Encounter: Payer: Medicare HMO | Admitting: Gastroenterology

## 2020-07-15 DIAGNOSIS — L281 Prurigo nodularis: Secondary | ICD-10-CM | POA: Diagnosis not present

## 2020-07-15 DIAGNOSIS — D1801 Hemangioma of skin and subcutaneous tissue: Secondary | ICD-10-CM | POA: Diagnosis not present

## 2020-07-15 DIAGNOSIS — D225 Melanocytic nevi of trunk: Secondary | ICD-10-CM | POA: Diagnosis not present

## 2020-07-15 DIAGNOSIS — L570 Actinic keratosis: Secondary | ICD-10-CM | POA: Diagnosis not present

## 2020-07-21 DIAGNOSIS — H43813 Vitreous degeneration, bilateral: Secondary | ICD-10-CM | POA: Diagnosis not present

## 2020-07-21 DIAGNOSIS — H2513 Age-related nuclear cataract, bilateral: Secondary | ICD-10-CM | POA: Diagnosis not present

## 2020-07-27 DIAGNOSIS — C439 Malignant melanoma of skin, unspecified: Secondary | ICD-10-CM

## 2020-07-27 HISTORY — DX: Malignant melanoma of skin, unspecified: C43.9

## 2020-08-06 ENCOUNTER — Telehealth: Payer: Self-pay | Admitting: Internal Medicine

## 2020-08-06 DIAGNOSIS — J849 Interstitial pulmonary disease, unspecified: Secondary | ICD-10-CM | POA: Diagnosis not present

## 2020-08-06 DIAGNOSIS — K449 Diaphragmatic hernia without obstruction or gangrene: Secondary | ICD-10-CM | POA: Diagnosis not present

## 2020-08-06 DIAGNOSIS — I4891 Unspecified atrial fibrillation: Secondary | ICD-10-CM | POA: Diagnosis not present

## 2020-08-06 DIAGNOSIS — Z7901 Long term (current) use of anticoagulants: Secondary | ICD-10-CM | POA: Diagnosis not present

## 2020-08-06 DIAGNOSIS — R0609 Other forms of dyspnea: Secondary | ICD-10-CM | POA: Diagnosis not present

## 2020-08-06 DIAGNOSIS — I700 Atherosclerosis of aorta: Secondary | ICD-10-CM | POA: Diagnosis not present

## 2020-08-06 DIAGNOSIS — Z9049 Acquired absence of other specified parts of digestive tract: Secondary | ICD-10-CM | POA: Diagnosis not present

## 2020-08-06 DIAGNOSIS — K439 Ventral hernia without obstruction or gangrene: Secondary | ICD-10-CM | POA: Diagnosis not present

## 2020-08-06 DIAGNOSIS — K219 Gastro-esophageal reflux disease without esophagitis: Secondary | ICD-10-CM | POA: Diagnosis not present

## 2020-08-06 DIAGNOSIS — Z8501 Personal history of malignant neoplasm of esophagus: Secondary | ICD-10-CM | POA: Diagnosis not present

## 2020-08-06 DIAGNOSIS — T182XXA Foreign body in stomach, initial encounter: Secondary | ICD-10-CM | POA: Diagnosis not present

## 2020-08-06 NOTE — Telephone Encounter (Signed)
I have spoken to [name] with Dr. Colin Ina office, whom is requesting location of CT that was preformed on 03/12/2020. Thomas Dominguez is going to contact [name] and request that they share imaging via sharepoint. Nothing further needed at this time.

## 2020-08-26 DIAGNOSIS — T17908D Unspecified foreign body in respiratory tract, part unspecified causing other injury, subsequent encounter: Secondary | ICD-10-CM | POA: Insufficient documentation

## 2020-08-26 DIAGNOSIS — Z9889 Other specified postprocedural states: Secondary | ICD-10-CM | POA: Diagnosis not present

## 2020-08-26 DIAGNOSIS — I480 Paroxysmal atrial fibrillation: Secondary | ICD-10-CM | POA: Diagnosis not present

## 2020-08-26 DIAGNOSIS — C155 Malignant neoplasm of lower third of esophagus: Secondary | ICD-10-CM | POA: Diagnosis not present

## 2020-08-26 DIAGNOSIS — K449 Diaphragmatic hernia without obstruction or gangrene: Secondary | ICD-10-CM | POA: Diagnosis not present

## 2020-08-26 DIAGNOSIS — K219 Gastro-esophageal reflux disease without esophagitis: Secondary | ICD-10-CM | POA: Diagnosis not present

## 2020-08-26 DIAGNOSIS — J849 Interstitial pulmonary disease, unspecified: Secondary | ICD-10-CM | POA: Diagnosis not present

## 2020-08-26 DIAGNOSIS — I491 Atrial premature depolarization: Secondary | ICD-10-CM | POA: Diagnosis not present

## 2020-08-26 DIAGNOSIS — Z9049 Acquired absence of other specified parts of digestive tract: Secondary | ICD-10-CM | POA: Diagnosis not present

## 2020-08-30 DIAGNOSIS — K311 Adult hypertrophic pyloric stenosis: Secondary | ICD-10-CM | POA: Diagnosis not present

## 2020-08-30 DIAGNOSIS — Z8501 Personal history of malignant neoplasm of esophagus: Secondary | ICD-10-CM | POA: Diagnosis not present

## 2020-08-30 DIAGNOSIS — J849 Interstitial pulmonary disease, unspecified: Secondary | ICD-10-CM | POA: Diagnosis not present

## 2020-08-30 DIAGNOSIS — Z9049 Acquired absence of other specified parts of digestive tract: Secondary | ICD-10-CM | POA: Diagnosis not present

## 2020-08-30 DIAGNOSIS — N400 Enlarged prostate without lower urinary tract symptoms: Secondary | ICD-10-CM | POA: Diagnosis not present

## 2020-08-30 DIAGNOSIS — I480 Paroxysmal atrial fibrillation: Secondary | ICD-10-CM | POA: Diagnosis not present

## 2020-08-30 DIAGNOSIS — K219 Gastro-esophageal reflux disease without esophagitis: Secondary | ICD-10-CM | POA: Diagnosis not present

## 2020-08-30 DIAGNOSIS — Z8719 Personal history of other diseases of the digestive system: Secondary | ICD-10-CM | POA: Diagnosis not present

## 2020-09-20 ENCOUNTER — Encounter: Payer: Self-pay | Admitting: Internal Medicine

## 2020-09-20 ENCOUNTER — Other Ambulatory Visit: Payer: Self-pay

## 2020-09-20 ENCOUNTER — Ambulatory Visit: Payer: Medicare HMO | Admitting: Internal Medicine

## 2020-09-20 VITALS — BP 128/80 | HR 84 | Temp 97.2°F | Ht 72.0 in | Wt 212.8 lb

## 2020-09-20 DIAGNOSIS — J690 Pneumonitis due to inhalation of food and vomit: Secondary | ICD-10-CM | POA: Diagnosis not present

## 2020-09-20 DIAGNOSIS — R0602 Shortness of breath: Secondary | ICD-10-CM | POA: Diagnosis not present

## 2020-09-20 NOTE — Progress Notes (Signed)
[facility]    [id number]    December 23, 1945  [facility], Garen Lah, MD Date of Appointment: 09/20/2020 Established Patient Visit  Chief complaint:   Chief Complaint  Patient presents with  . Follow-up    SOB improved     HPI: Thomas Dominguez is a 75 y.o. gentleman who presented initially for dyspnea evaluation.  Concern for ILD, now felt to be secondary to chronic aspiration.   Interval Updates: He is three weeks s/p pyloric stenosis dilation. His symptoms of delayed food transit passage are much improved. He doesn't feel as much pressure in his chest after eating.Nocturnal symptoms are also improved. He has stopped symbicort. He feels that his breathing is improved. He has also stopped using albuterol - keeps it with him for long exertion but hasn't had to use it.   I was able to review his PFTs done at [place] last month and they show stable FVC and lung function.   I have reviewed the patient's family social and past medical history and updated as appropriate.   Past Medical History:  Diagnosis Date  . Atrial fibrillation ([place]) 01/17/2019  . BPH (benign prostatic hyperplasia) 01/17/2019  . Degenerative disc disease, lumbar 01/17/2019  . Depression, major, single episode, complete remission ([place]) 01/17/2019  . Diverticulosis 01/17/2019  . Dysrhythmia   . Esophageal cancer ([place])   . GERD (gastroesophageal reflux disease)   . H/O small bowel obstruction 01/17/2019  . Hiatal hernia 01/17/2019  . Hypertension   . Pneumonia 11/2019  . SBO (small bowel obstruction) ([place]) 11/20/2019  . Wears glasses     Past Surgical History:  Procedure Laterality Date  . ABDOMINAL HERNIA REPAIR  2012, 2014, 2017   ventral  . BRONCHIAL BIOPSY  04/08/2020   Procedure: BRONCHIAL BIOPSIES;  Surgeon: Marshell Garfinkel, MD;  [facility];  Service: Cardiopulmonary;;  . BRONCHIAL BRUSHINGS  04/08/2020   Procedure: BRONCHIAL BRUSHINGS;  Surgeon: Marshell Garfinkel, MD;   [facility];  Service: Cardiopulmonary;;  . BRONCHIAL WASHINGS  04/08/2020   Procedure: BRONCHIAL WASHINGS;  Surgeon: Marshell Garfinkel, MD;  [facility];  Service: Cardiopulmonary;;  . COLONOSCOPY  2019  . ESOPHAGECTOMY  1997   for esophageal cancer  . ESOPHAGOGASTRODUODENOSCOPY (EGD) WITH PROPOFOL N/A 06/11/2020   Procedure: ESOPHAGOGASTRODUODENOSCOPY (EGD) WITH PROPOFOL;  Surgeon: Mauri Pole, MD;  [facility];  Service: Endoscopy;  Laterality: N/A;  . PARATHYROIDECTOMY Right 01/26/2020   Procedure: RIGHT INFERIOR PARATHYROIDECTOMY;  Surgeon: [name], MD;  [facility];  Service: General;  Laterality: Right;  . SPINAL FUSION  2018, 2020   L2 L5 in 2018, L5-S1 2020  . VIDEO BRONCHOSCOPY N/A 04/08/2020   Procedure: VIDEO BRONCHOSCOPY WITH FLUORO;  Surgeon: Marshell Garfinkel, MD;  [facility];  Service: Cardiopulmonary;  Laterality: N/A;    Family History  Problem Relation Age of Onset  . Emphysema Father        smoker  . GER disease Daughter   . GER disease Son   . GER disease Mother   . Lung cancer Sister        never smoked  . Colon cancer Neg Hx   . Stomach cancer Neg Hx     Social History   Occupational History  . Occupation: retired    Fish farm manager: [employer]  Tobacco Use  . Smoking status: Never Smoker  . Smokeless tobacco: Never Used  Vaping Use  . Vaping Use: Never used  Substance and Sexual Activity  . Alcohol use: Yes    Alcohol/week: 7.0 standard drinks    Types: 7 Cans of beer per week  . Drug use: Never  . Sexual activity: Not on file     Physical Exam: Blood pressure 128/80, pulse 84, temperature (!) 97.2 F (36.2 C), temperature source Temporal, height 6' (1.829 m), weight 212 lb 12.8 oz (96.5 kg), SpO2 96 %.  Gen:      No acute distress Lungs:    Clear to auscultation bilaterally, no wheezes or crackles CV:         RRR, no mrg   Data Reviewed: Imaging: I have personally reviewed the CT  Chest from September 2021 which shows some peribronchovascular opacities and micronodules with surrounding ground glass, improved from previous study  PFTs:  PFT Results Latest Ref Rng & Units 12/12/2019  FVC-Pre L 3.06  FVC-Predicted Pre % 65  FVC-Post L 3.35  FVC-Predicted Post % 71  Pre FEV1/FVC % % 74  Post FEV1/FCV % % 78  FEV1-Pre L 2.25  FEV1-Predicted Pre % 66  FEV1-Post L 2.60  DLCO uncorrected ml/min/mmHg 20.05  DLCO UNC% % 74  DLCO corrected ml/min/mmHg 20.59  DLCO COR %Predicted % 76  DLVA Predicted % 98  TLC L 6.04  TLC % Predicted % 81  RV % Predicted % 102   I have personally reviewed the patient's PFTs performed at [facility] in Feb 2022 - they show stability, without worsening.   Labs: HP panel negative Pathology reviewed from 04/08/2020 A. LUNG, RUL, BIOPSY:  - Benign unremarkable bronchiolar mucosa and lung parenchyma  - Negative for granulomas or increased eosinophils   B. LUNG, RML, BIOPSY:  - Benign lung parenchyma with a poorly formed nonnecrotizing granuloma  with multinucleated giant cells. See comment   C. LUNG, RLL, BIOPSY:  - Benign bronchiolar mucosa and lung parenchyma with a multinucleated  giant cell. See comment   Immunization status: Immunization History  Administered Date(s) Administered  . Fluad Quad(high Dose 65+) 04/23/2019, 03/17/2020  . Influenza Inj Mdck Quad Pf 07/04/2017, 06/11/2018  . Influenza, Quadrivalent, Recombinant, Inj, Pf 04/21/2019  . Influenza-Unspecified 04/28/2019  . PFIZER(Purple Top)SARS-COV-2 Vaccination 11/26/2019, 12/17/2019, 06/09/2020  . Pneumococcal Conjugate-13 03/22/2020  . Tdap 03/22/2020    Assessment:  Dyspnea on Exertion, improved Chronic Aspiration, improved Atrial Fibrillation on Eliquis   Plan/Recommendations: Improved symptoms, ok to continue off inhalers, fine to continue albuterol as needed for exertional dyspnea. Would like to repeat lung function testing in 6 months to document  stability and repeating imaging at that time as well to ensure resolution of the microaspiration changes.  He will let us know if the symptoms of dyspnea worsen or change.  Return to Care: Return in about 6 months (around 03/23/2021).  [name], MD Pulmonary and [place]

## 2020-09-20 NOTE — Patient Instructions (Signed)
The patient should have follow up scheduled with myself in 6 months.   Prior to next visit patient should have: Full set of PFTs - 1 hour, in 6 months, appt after  CT scan in 6 months prior to appointment

## 2020-09-20 NOTE — Telephone Encounter (Signed)
If no symptoms can plan on appointment in May, but if BP dropping below 100 and feeling symptomatic with it such as lightheadedness would schedule sooner

## 2020-09-28 ENCOUNTER — Telehealth: Payer: Self-pay | Admitting: *Deleted

## 2020-09-28 NOTE — Telephone Encounter (Signed)
Patient assistance forms faxed to [facility]

## 2020-10-01 NOTE — Telephone Encounter (Signed)
Received fax from [employer]-patient approved.

## 2020-11-01 ENCOUNTER — Other Ambulatory Visit: Payer: Self-pay | Admitting: Family Medicine

## 2020-11-15 ENCOUNTER — Other Ambulatory Visit: Payer: Self-pay | Admitting: Family Medicine

## 2020-11-23 ENCOUNTER — Ambulatory Visit: Payer: Medicare HMO | Admitting: Cardiology

## 2020-11-25 ENCOUNTER — Other Ambulatory Visit: Payer: Self-pay

## 2020-11-25 MED ORDER — ONDANSETRON 4 MG PO TBDP: 4.0000 mg | ORAL_TABLET | Freq: Every day | ORAL | 0 refills | Status: DC | PRN

## 2020-12-11 ENCOUNTER — Other Ambulatory Visit: Payer: Self-pay | Admitting: Family Medicine

## 2020-12-15 ENCOUNTER — Other Ambulatory Visit: Payer: Self-pay | Admitting: Gastroenterology

## 2020-12-25 DIAGNOSIS — I480 Paroxysmal atrial fibrillation: Secondary | ICD-10-CM

## 2020-12-25 DIAGNOSIS — R42 Dizziness and giddiness: Secondary | ICD-10-CM

## 2020-12-26 ENCOUNTER — Other Ambulatory Visit: Payer: Self-pay | Admitting: Family Medicine

## 2020-12-29 ENCOUNTER — Encounter: Payer: Self-pay | Admitting: Student

## 2020-12-29 MED ORDER — METOPROLOL TARTRATE 25 MG PO TABS: 12.5000 mg | ORAL_TABLET | Freq: Two times a day (BID) | ORAL | 0 refills | Status: DC

## 2020-12-29 NOTE — Telephone Encounter (Signed)
Yes sounds reasonable to get an echo.  Would recommend stopping diltiazem given hypotension.  Given he is on propafenone, would ideally still need some AV nodal blocker, would start lopressor 12.5 mg BID.  Recommend he also see his PCP for work-up of his low BP before we see him later this month.

## 2021-01-02 ENCOUNTER — Emergency Department (HOSPITAL_COMMUNITY): Payer: Medicare HMO

## 2021-01-02 ENCOUNTER — Other Ambulatory Visit: Payer: Self-pay

## 2021-01-02 ENCOUNTER — Encounter (HOSPITAL_COMMUNITY): Payer: Self-pay | Admitting: Emergency Medicine

## 2021-01-02 ENCOUNTER — Inpatient Hospital Stay (HOSPITAL_COMMUNITY)
Admission: EM | Admit: 2021-01-02 | Discharge: 2021-01-04 | DRG: 390 | Disposition: A | Discharge status: Home / Self Care | Payer: Medicare HMO | Attending: Internal Medicine | Admitting: Internal Medicine

## 2021-01-02 DIAGNOSIS — Z20822 Contact with and (suspected) exposure to covid-19: Secondary | ICD-10-CM | POA: Diagnosis not present

## 2021-01-02 DIAGNOSIS — Z981 Arthrodesis status: Secondary | ICD-10-CM | POA: Diagnosis not present

## 2021-01-02 DIAGNOSIS — Z452 Encounter for adjustment and management of vascular access device: Secondary | ICD-10-CM | POA: Diagnosis not present

## 2021-01-02 DIAGNOSIS — R1084 Generalized abdominal pain: Secondary | ICD-10-CM | POA: Diagnosis not present

## 2021-01-02 DIAGNOSIS — K6389 Other specified diseases of intestine: Secondary | ICD-10-CM | POA: Diagnosis not present

## 2021-01-02 DIAGNOSIS — R5381 Other malaise: Secondary | ICD-10-CM | POA: Diagnosis not present

## 2021-01-02 DIAGNOSIS — E785 Hyperlipidemia, unspecified: Secondary | ICD-10-CM | POA: Diagnosis not present

## 2021-01-02 DIAGNOSIS — N400 Enlarged prostate without lower urinary tract symptoms: Secondary | ICD-10-CM | POA: Diagnosis not present

## 2021-01-02 DIAGNOSIS — R519 Headache, unspecified: Secondary | ICD-10-CM | POA: Diagnosis present

## 2021-01-02 DIAGNOSIS — R3911 Hesitancy of micturition: Secondary | ICD-10-CM | POA: Diagnosis not present

## 2021-01-02 DIAGNOSIS — Z4659 Encounter for fitting and adjustment of other gastrointestinal appliance and device: Secondary | ICD-10-CM

## 2021-01-02 DIAGNOSIS — Z7901 Long term (current) use of anticoagulants: Secondary | ICD-10-CM

## 2021-01-02 DIAGNOSIS — I10 Essential (primary) hypertension: Secondary | ICD-10-CM | POA: Diagnosis not present

## 2021-01-02 DIAGNOSIS — Z888 Allergy status to other drugs, medicaments and biological substances status: Secondary | ICD-10-CM

## 2021-01-02 DIAGNOSIS — K5939 Other megacolon: Secondary | ICD-10-CM | POA: Diagnosis not present

## 2021-01-02 DIAGNOSIS — I4891 Unspecified atrial fibrillation: Secondary | ICD-10-CM | POA: Diagnosis present

## 2021-01-02 DIAGNOSIS — K219 Gastro-esophageal reflux disease without esophagitis: Secondary | ICD-10-CM | POA: Diagnosis present

## 2021-01-02 DIAGNOSIS — Z4682 Encounter for fitting and adjustment of non-vascular catheter: Secondary | ICD-10-CM | POA: Diagnosis not present

## 2021-01-02 DIAGNOSIS — N401 Enlarged prostate with lower urinary tract symptoms: Secondary | ICD-10-CM | POA: Diagnosis not present

## 2021-01-02 DIAGNOSIS — I480 Paroxysmal atrial fibrillation: Secondary | ICD-10-CM | POA: Diagnosis not present

## 2021-01-02 DIAGNOSIS — K56609 Unspecified intestinal obstruction, unspecified as to partial versus complete obstruction: Secondary | ICD-10-CM | POA: Diagnosis not present

## 2021-01-02 DIAGNOSIS — Z8501 Personal history of malignant neoplasm of esophagus: Secondary | ICD-10-CM

## 2021-01-02 DIAGNOSIS — K449 Diaphragmatic hernia without obstruction or gangrene: Secondary | ICD-10-CM | POA: Diagnosis not present

## 2021-01-02 DIAGNOSIS — Z79899 Other long term (current) drug therapy: Secondary | ICD-10-CM | POA: Diagnosis not present

## 2021-01-02 DIAGNOSIS — Z886 Allergy status to analgesic agent status: Secondary | ICD-10-CM

## 2021-01-02 DIAGNOSIS — K5790 Diverticulosis of intestine, part unspecified, without perforation or abscess without bleeding: Secondary | ICD-10-CM | POA: Diagnosis not present

## 2021-01-02 DIAGNOSIS — K5669 Other partial intestinal obstruction: Secondary | ICD-10-CM | POA: Diagnosis not present

## 2021-01-02 DIAGNOSIS — R140 Abdominal distension (gaseous): Secondary | ICD-10-CM | POA: Diagnosis not present

## 2021-01-02 LAB — CBC
HCT: 48.3 % (ref 39.0–52.0)
Hemoglobin: 16.1 g/dL (ref 13.0–17.0)
MCH: 32.1 pg (ref 26.0–34.0)
MCHC: 33.3 g/dL (ref 30.0–36.0)
MCV: 96.4 fL (ref 80.0–100.0)
Platelets: 208 10*3/uL (ref 150–400)
RBC: 5.01 MIL/uL (ref 4.22–5.81)
RDW: 12.5 % (ref 11.5–15.5)
WBC: 9.5 10*3/uL (ref 4.0–10.5)
nRBC: 0 % (ref 0.0–0.2)

## 2021-01-02 LAB — COMPREHENSIVE METABOLIC PANEL
ALT: 30 U/L (ref 0–44)
AST: 23 U/L (ref 15–41)
Albumin: 3.4 g/dL — ABNORMAL LOW (ref 3.5–5.0)
Alkaline Phosphatase: 90 U/L (ref 38–126)
Anion gap: 10 (ref 5–15)
BUN: 10 mg/dL (ref 8–23)
CO2: 26 mmol/L (ref 22–32)
Calcium: 9.2 mg/dL (ref 8.9–10.3)
Chloride: 100 mmol/L (ref 98–111)
Creatinine, Ser: 0.98 mg/dL (ref 0.61–1.24)
GFR, Estimated: >60 mL/min (ref >60)
Glucose, Bld: 135 mg/dL — ABNORMAL HIGH (ref 70–99)
Potassium: 3.9 mmol/L (ref 3.5–5.1)
Sodium: 136 mmol/L (ref 135–145)
Total Bilirubin: 0.5 mg/dL (ref 0.3–1.2)
Total Protein: 6.4 g/dL — ABNORMAL LOW (ref 6.5–8.1)

## 2021-01-02 LAB — LIPASE, BLOOD: Lipase: 28 U/L (ref 11–51)

## 2021-01-02 MED ORDER — MORPHINE SULFATE (PF) 4 MG/ML IV SOLN
4.0000 mg | Freq: Once | INTRAVENOUS | Status: AC
Start: 2021-01-02 — End: 2021-01-02
  Administered 2021-01-02: 4 mg via INTRAVENOUS
  Filled 2021-01-02: qty 1

## 2021-01-02 MED ORDER — FENTANYL CITRATE (PF) 100 MCG/2ML IJ SOLN
50.0000 ug | Freq: Once | INTRAMUSCULAR | Status: AC
  Administered 2021-01-02: 50 ug via INTRAVENOUS
  Filled 2021-01-02: qty 2

## 2021-01-02 MED ORDER — ONDANSETRON HCL 4 MG/2ML IJ SOLN
4.0000 mg | Freq: Once | INTRAMUSCULAR | Status: AC
  Administered 2021-01-02: 4 mg via INTRAVENOUS
  Filled 2021-01-02: qty 2

## 2021-01-02 MED ORDER — FENTANYL CITRATE (PF) 100 MCG/2ML IJ SOLN
50.0000 ug | Freq: Once | INTRAMUSCULAR | Status: AC
Start: 2021-01-02 — End: 2021-01-02
  Administered 2021-01-02: 50 ug via INTRAVENOUS
  Filled 2021-01-02: qty 2

## 2021-01-02 MED ORDER — SODIUM CHLORIDE 0.9 % IV BOLUS
500.0000 mL | Freq: Once | INTRAVENOUS | Status: AC
Start: 2021-01-02 — End: 2021-01-02
  Administered 2021-01-02: 500 mL via INTRAVENOUS

## 2021-01-02 NOTE — ED Provider Notes (Signed)
[facility] EMERGENCY DEPARTMENT Provider Note   CSN: [id number] Arrival date & time: 01/02/21  1942     History Chief Complaint  Patient presents with   Abdominal Pain    Thomas Dominguez is a 75 y.o. male.  Patient is a 75 year old male with a history of atrial fibrillation on Eliquis, BPH, GERD, hypertension and prior small bowel obstructions who presents with abdominal pain and distention.  He reports that throughout today he developed some cramping in his abdomen with distention and increasing pain.  He has had some nausea and dry heaves.  He has a associated headache.  He had a bowel movement yesterday but has not had one today and has not been passing gas today.  He has had 5 prior small bowel obstructions he states that are from prior scar tissue.  He says his symptoms feel similar to his prior small bowel obstructions.  He states that they have always resolved with NG tube decompression and he has not had recurrent surgery for a small bowel obstruction.      Past Medical History:  Diagnosis Date   Atrial fibrillation ([place]) 01/17/2019   BPH (benign prostatic hyperplasia) 01/17/2019   Degenerative disc disease, lumbar 01/17/2019   Depression, major, single episode, complete remission ([place]) 01/17/2019   Diverticulosis 01/17/2019   Dysrhythmia    Esophageal cancer (HCC)    GERD (gastroesophageal reflux disease)    H/O small bowel obstruction 01/17/2019   Hiatal hernia 01/17/2019   Hypertension    Pneumonia 11/2019   SBO (small bowel obstruction) ([place]) 11/20/2019   Wears glasses     Patient Active Problem List   Diagnosis Date Noted   Pneumothorax 04/08/2020   Acute pneumothorax 04/08/2020   Sarcoidosis    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 11/22/2019   SBO (small bowel obstruction) ([place]) 11/20/2019   Acute ataxia 09/30/2019   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   Primary hyperparathyroidism ([place])  09/30/2019   Dyspnea on exertion 09/05/2019   Hyperlipidemia 09/05/2019   Leg swelling 01/17/2019   Atrial fibrillation ([place]) 01/17/2019   Acid reflux disease 01/17/2019   H/O small bowel obstruction 01/17/2019   Hiatal hernia 01/17/2019   Depression, major, single episode, complete remission ([place]) 01/17/2019   BPH (benign prostatic hyperplasia) 01/17/2019   Degenerative disc disease, lumbar 01/17/2019   Diverticulosis 01/17/2019    Past Surgical History:  Procedure Laterality Date   ABDOMINAL HERNIA REPAIR  2012, 2014, 2017   ventral   BRONCHIAL BIOPSY  04/08/2020   Procedure: BRONCHIAL BIOPSIES;  Surgeon: Marshell Garfinkel, MD;  Location: [facility];  Service: Cardiopulmonary;;   BRONCHIAL BRUSHINGS  04/08/2020   Procedure: BRONCHIAL BRUSHINGS;  Surgeon: Marshell Garfinkel, MD;  Location: [facility];  Service: Cardiopulmonary;;   BRONCHIAL WASHINGS  04/08/2020   Procedure: BRONCHIAL WASHINGS;  Surgeon: Marshell Garfinkel, MD;  Location: [facility];  Service: Cardiopulmonary;;   COLONOSCOPY  2019   ESOPHAGECTOMY  1997   for esophageal cancer   ESOPHAGOGASTRODUODENOSCOPY (EGD) WITH PROPOFOL N/A 06/11/2020   Procedure: ESOPHAGOGASTRODUODENOSCOPY (EGD) WITH PROPOFOL;  Surgeon: Mauri Pole, MD;  Location: [facility];  Service: Endoscopy;  Laterality: N/A;   PARATHYROIDECTOMY Right 01/26/2020   Procedure: RIGHT INFERIOR PARATHYROIDECTOMY;  Surgeon: [name], MD;  Location: [facility];  Service: General;  Laterality: Right;   SPINAL FUSION  2018, 2020   L2 L5 in 2018, L5-S1 2020   VIDEO BRONCHOSCOPY N/A 04/08/2020   Procedure: VIDEO BRONCHOSCOPY  WITH FLUORO;  Surgeon: Marshell Garfinkel, MD;  Location: [facility];  Service: Cardiopulmonary;  Laterality: N/A;       Family History  Problem Relation Age of Onset   Emphysema Father        smoker   GER disease Daughter    GER disease Son    GER disease Mother    Lung cancer Sister        never smoked   Colon cancer Neg Hx     Stomach cancer Neg Hx     Social History   Tobacco Use   Smoking status: Never   Smokeless tobacco: Never  Vaping Use   Vaping Use: Never used  Substance Use Topics   Alcohol use: Yes    Alcohol/week: 7.0 standard drinks    Types: 7 Cans of beer per week   Drug use: Never    Home Medications Prior to Admission medications   Medication Sig Start Date End Date Taking? Authorizing Provider  albuterol (VENTOLIN HFA) 108 (90 Base) MCG/ACT inhaler Inhale 2 puffs into the lungs every 6 (six) hours as needed. Patient taking differently: Inhale 2 puffs into the lungs every 6 (six) hours as needed for wheezing or shortness of breath. 03/17/20  Yes Spero Geralds, MD  apixaban (ELIQUIS) 5 MG TABS tablet Take 1 tablet (5 mg total) by mouth 2 (two) times daily. 05/18/20  Yes Donato Heinz, MD  famotidine (PEPCID) 20 MG tablet TAKE 1 TABLET BY MOUTH EVERYDAY AT BEDTIME Patient taking differently: Take 20 mg by mouth at bedtime. 06/28/20  Yes Nandigam, [name], MD  methocarbamol (ROBAXIN) 750 MG tablet TAKE 1 TABLET TWICE DAILY AS NEEDED FOR MUSCLE SPASM(S) Patient taking differently: Take 750 mg by mouth 2 (two) times daily as needed for muscle spasms. 12/13/20  Yes Benay Pike, MD  metoprolol tartrate (LOPRESSOR) 25 MG tablet Take 0.5 tablets (12.5 mg total) by mouth 2 (two) times daily. 12/29/20  Yes Donato Heinz, MD  Multiple Vitamins-Minerals (CENTRUM SILVER 50+MEN) TABS Take 1 tablet by mouth daily.   Yes [provider]  omeprazole (PRILOSEC) 40 MG capsule TAKE 1 CAPSULE TWICE DAILY Patient taking differently: Take 40 mg by mouth in the morning and at bedtime. 12/15/20  Yes Nandigam, [name], MD  ondansetron (ZOFRAN ODT) 4 MG disintegrating tablet Take 1 tablet (4 mg total) by mouth daily as needed for nausea or vomiting. 11/25/20  Yes Nandigam, [name], MD  propafenone (RYTHMOL) 225 MG tablet TAKE 1 TABLET (225 MG TOTAL) BY MOUTH EVERY 8 HOURS. Patient  taking differently: Take 225 mg by mouth 3 (three) times daily. 01/13/20  Yes Donato Heinz, MD  tamsulosin (FLOMAX) 0.4 MG CAPS capsule Take 1 capsule (0.4 mg total) by mouth in the morning and at bedtime. 05/27/20  Yes Benay Pike, MD  traMADol (ULTRAM) 50 MG tablet TAKE 1 TABLET BY MOUTH EVERY 12 HOURS AS NEEDED FOR MODERATE PAIN. Patient taking differently: Take 50 mg by mouth every 12 (twelve) hours as needed for moderate pain. 11/02/20  Yes Benay Pike, MD  venlafaxine XR (EFFEXOR-XR) 150 MG 24 hr capsule TAKE 1 CAPSULE EVERY DAY WITH BREAKFAST Patient taking differently: Take 150 mg by mouth daily with breakfast. 11/16/20  Yes Benay Pike, MD  diltiazem [facility]) 120 MG 24 hr capsule Take 120 mg by mouth daily. Patient not taking: No sig reported 11/19/18   [provider]    Allergies    Mobic [meloxicam] and  Statins  Review of Systems   Review of Systems  Constitutional:  Negative for chills, diaphoresis, fatigue and fever.  HENT:  Negative for congestion, rhinorrhea and sneezing.   Eyes: Negative.   Respiratory:  Negative for cough, chest tightness and shortness of breath.   Cardiovascular:  Negative for chest pain and leg swelling.  Gastrointestinal:  Positive for abdominal distention, abdominal pain, nausea and vomiting. Negative for blood in stool and diarrhea.  Genitourinary:  Negative for difficulty urinating, flank pain, frequency and hematuria.  Musculoskeletal:  Negative for arthralgias and back pain.  Skin:  Negative for rash.  Neurological:  Negative for dizziness, speech difficulty, weakness, numbness and headaches.   Physical Exam Updated Vital Signs BP 135/89   Pulse 63   Temp 98.1 F (36.7 C) (Rectal)   Resp 16   Ht 6' (1.829 m)   Wt 96.2 kg   SpO2 98%   BMI 28.75 kg/m   Physical Exam Constitutional:      Appearance: He is well-developed.  HENT:     Head: Normocephalic and atraumatic.  Eyes:     Pupils: Pupils are equal,  round, and reactive to light.  Cardiovascular:     Rate and Rhythm: Normal rate and regular rhythm.     Heart sounds: Normal heart sounds.  Pulmonary:     Effort: Pulmonary effort is normal. No respiratory distress.     Breath sounds: Normal breath sounds. No wheezing or rales.  Chest:     Chest wall: No tenderness.  Abdominal:     General: Bowel sounds are normal. There is distension.     Palpations: Abdomen is soft.     Tenderness: There is generalized abdominal tenderness. There is no guarding or rebound.     Comments: Tympanic to percussion  Musculoskeletal:        General: Normal range of motion.     Cervical back: Normal range of motion and neck supple.  Lymphadenopathy:     Cervical: No cervical adenopathy.  Skin:    General: Skin is warm and dry.     Findings: No rash.  Neurological:     Mental Status: He is alert and oriented to person, place, and time.    ED Results / Procedures / Treatments   Labs (all labs ordered are listed, but only abnormal results are displayed) Labs Reviewed  COMPREHENSIVE METABOLIC PANEL - Abnormal; Notable for the following components:      Result Value   Glucose, Bld 135 (*)    Total Protein 6.4 (*)    Albumin 3.4 (*)    All other components within normal limits  LIPASE, BLOOD  CBC  URINALYSIS, ROUTINE W REFLEX MICROSCOPIC    EKG EKG Interpretation  Date/Time:  Sunday January 02 2021 19:57:04 EDT Ventricular Rate:  61 PR Interval:  184 QRS Duration: 76 QT Interval:  392 QTC Calculation: 394 R Axis:   74 Text Interpretation: Normal sinus rhythm Normal ECG No old tracing to compare Confirmed by Malvin Johns [phone]) on 01/02/2021 9:47:44 PM  Radiology CT Abdomen Pelvis Wo Contrast  Result Date: 01/02/2021 CLINICAL DATA:  Bowel obstruction suspected EXAM: CT ABDOMEN AND PELVIS WITHOUT CONTRAST TECHNIQUE: Multidetector CT imaging of the abdomen and pelvis was performed following the standard protocol without IV contrast.  COMPARISON:  Radiograph 01/02/2021, upper GI 05/17/2020, CT chest 03/12/2020, CT abdomen pelvis 11/20/2019 FINDINGS: Lower chest: Redemonstration of some irregular regions of peribronchovascular consolidation and ground-glass opacity in the lungs in a very similar distribution to  prior exam. Large hiatal hernia is again noted containing much of the stomach as well as portion of the transverse colon. Normal heart size. No pericardial effusion. Hepatobiliary: Stable appearance of a fluid attenuation structure in the posterior right lobe liver measuring up to 2.5 cm, probable benign cyst. No concerning focal liver lesion. Smooth liver surface contour. Normal gallbladder and biliary tree. Pancreas: Partial fatty replacement of the pancreas displaced into the large hiatal hernia as seen previously. No pancreatic ductal dilatation or surrounding inflammatory changes. Spleen: Normal in size. No concerning splenic lesions. Adrenals/Urinary Tract: Normal adrenals. Mild bilateral perinephric stranding is unchanged from comparison prior. No concerning focal renal lesion. No urolithiasis or hydronephrosis. Mild bladder wall thickening with slight indentation of the bladder base by an enlarged prostate. Stomach/Bowel: Large hiatal hernia containing fluid, some mild wall thickening is nonspecific. Duodenum with a normal course across the midline abdomen to the ligament of Treitz. Air and fluid distention of much of the proximal small bowel to the level of some fecalized contents at a small bowel-small bowel anastomosis in the left lower quadrant. More distal small bowel/terminal ileum beyond this point is relatively decompressed. A normal appendix is visualized. Portion of the distal transverse colon contained within the large hiatal hernia sac. Scattered colonic diverticula without focal inflammation to suggest diverticulitis. Segmental thickening of the sigmoid colon without convincing acute inflammatory change at this time,  can reflect sequela of prior diverticular inflammation. No extraluminal gas or organized collection. Vascular/Lymphatic: No significant vascular findings are present. No enlarged abdominal or pelvic lymph nodes. Reproductive: Prostatomegaly. Few eccentric calcifications of the prostate. Seminal vesicles are unremarkable. Other: Large hiatal hernia with postsurgical changes suggesting prior attempted surgical repair. Additional postsurgical changes from prior ventral hernia repair. Bilateral fat containing inguinal hernias. No bowel containing hernias. No abdominopelvic free air or fluid. Musculoskeletal: Prior L2-S1 PLIF with unchanged appearance of the hardware including subsidence of the S1 superior endplate. Background of diffuse multilevel degenerative changes in the spine with dextrocurvature of the thoracolumbar junction and gradual lumbar levocurvature, apex L3. Additional degenerative changes in the hips and pelvis. IMPRESSION: Appearance suggesting a possible partial or early developing small bowel obstruction with possible transition towards the site of small bowel anastomosis in the left lower quadrant where fecalized contents may reflect a small bowel feces sign. More distal segments of small bowel are decompressed beyond this level. Large hiatal hernia containing much of the stomach, portion of the distal transverse colon, and pancreas. No resulting obstruction or complication is seen at this level acutely. Evidence of prior surgical change at the diaphragmatic hiatus. Segmental thickening of the sigmoid but without additional features of active diverticular inflammation at this time, could reflect sequela of prior inflammatory change versus a chronic or smoldering inflammatory process such as segmental colitis associated with diverticulosis. Correlate with clinical symptoms and consider outpatient evaluation with direct visualization if not recently performed. Mild bladder wall thickening with enlarged  prostate. Could reflect sequela of chronic outlet obstruction though should correlate with urinalysis to exclude cystitis. Irregular regions of peribronchovascular consolidation and ground-glass in the lung bases which could reflect a systemic process such as sarcoidosis or sequela of chronic aspiration particularly given the large hiatal hernia. Overall, appearance is not significantly changed from comparison prior HRCT imaging. Electronically Signed   By: Lovena Le M.D.   On: 01/02/2021 22:55   DG ABD ACUTE 2+V W 1V CHEST  Result Date: 01/02/2021 CLINICAL DATA:  Small bowel obstruction suspected EXAM: DG ABDOMEN ACUTE WITH  1 VIEW CHEST COMPARISON:  May 17, 2020 FINDINGS: There is no evidence of dilated bowel loops or free intraperitoneal air. No radiopaque calculi or other significant radiographic abnormality is seen. Heart size and mediastinal contours are within normal limits. Hernia repair tacks overlie the abdomen. Prior lumbosacral fusion. Bibasilar atelectasis. IMPRESSION: Nonobstructive bowel gas.  No acute cardiopulmonary disease. Electronically Signed   By: Dahlia Bailiff MD   On: 01/02/2021 20:44    Procedures Procedures   Medications Ordered in ED Medications  sodium chloride 0.9 % bolus 500 mL (0 mLs Intravenous Stopped 01/02/21 2225)  fentaNYL (SUBLIMAZE) injection 50 mcg (50 mcg Intravenous Given 01/02/21 2015)  ondansetron (ZOFRAN) injection 4 mg (4 mg Intravenous Given 01/02/21 2015)  fentaNYL (SUBLIMAZE) injection 50 mcg (50 mcg Intravenous Given 01/02/21 2222)  ondansetron (ZOFRAN) injection 4 mg (4 mg Intravenous Given 01/02/21 2222)  morphine 4 MG/ML injection 4 mg (4 mg Intravenous Given 01/02/21 2330)    ED Course  I have reviewed the triage vital signs and the nursing notes.  Pertinent labs & imaging results that were available during my care of the patient were reviewed by me and considered in my medical decision making (see chart for details).    MDM  Rules/Calculators/A&P                          Patient is a 75 year old male who presents with abdominal bloating and nausea/dry heaves.  He has had a history of multiple small bowel obstructions in the past.  He has not required surgery in the past.  He abdominal series did not show any definite obstruction.  CT scan was performed which does show early/developing small bowel obstruction.  NG tube was placed.  Labs are nonconcerning.  Will consult unassigned medicine for admission.  I spoke with Dr. Linda Hedges who will admit the pt. Final Clinical Impression(s) / ED Diagnoses Final diagnoses:  SBO (small bowel obstruction) ([place])    Rx / DC Orders ED Discharge Orders     None        Malvin Johns, MD 01/02/21 2332

## 2021-01-02 NOTE — ED Notes (Signed)
Unable to obtain temperature.

## 2021-01-02 NOTE — ED Triage Notes (Signed)
Pt presents to ED via [facility]. c/o acute onset of abdominal pain today along with nausea dry heaves, headache, and upper abdominal tenderness. Extremely diaphoretic and labored breathing in triage. Hx bowel obstruction and surgeries. BM yesterday, normal. Denies CP

## 2021-01-03 ENCOUNTER — Inpatient Hospital Stay (HOSPITAL_COMMUNITY): Payer: Medicare HMO

## 2021-01-03 DIAGNOSIS — I4891 Unspecified atrial fibrillation: Secondary | ICD-10-CM | POA: Diagnosis present

## 2021-01-03 DIAGNOSIS — R519 Headache, unspecified: Secondary | ICD-10-CM | POA: Diagnosis present

## 2021-01-03 DIAGNOSIS — N401 Enlarged prostate with lower urinary tract symptoms: Secondary | ICD-10-CM

## 2021-01-03 DIAGNOSIS — K56609 Unspecified intestinal obstruction, unspecified as to partial versus complete obstruction: Principal | ICD-10-CM

## 2021-01-03 DIAGNOSIS — Z20822 Contact with and (suspected) exposure to covid-19: Secondary | ICD-10-CM | POA: Diagnosis present

## 2021-01-03 DIAGNOSIS — Z79899 Other long term (current) drug therapy: Secondary | ICD-10-CM | POA: Diagnosis not present

## 2021-01-03 DIAGNOSIS — Z8501 Personal history of malignant neoplasm of esophagus: Secondary | ICD-10-CM | POA: Diagnosis not present

## 2021-01-03 DIAGNOSIS — N400 Enlarged prostate without lower urinary tract symptoms: Secondary | ICD-10-CM | POA: Diagnosis present

## 2021-01-03 DIAGNOSIS — K219 Gastro-esophageal reflux disease without esophagitis: Secondary | ICD-10-CM | POA: Diagnosis present

## 2021-01-03 DIAGNOSIS — I480 Paroxysmal atrial fibrillation: Secondary | ICD-10-CM | POA: Diagnosis not present

## 2021-01-03 DIAGNOSIS — Z7901 Long term (current) use of anticoagulants: Secondary | ICD-10-CM | POA: Diagnosis not present

## 2021-01-03 DIAGNOSIS — R3911 Hesitancy of micturition: Secondary | ICD-10-CM | POA: Diagnosis not present

## 2021-01-03 DIAGNOSIS — Z886 Allergy status to analgesic agent status: Secondary | ICD-10-CM | POA: Diagnosis not present

## 2021-01-03 DIAGNOSIS — E785 Hyperlipidemia, unspecified: Secondary | ICD-10-CM | POA: Diagnosis present

## 2021-01-03 DIAGNOSIS — I10 Essential (primary) hypertension: Secondary | ICD-10-CM | POA: Diagnosis present

## 2021-01-03 DIAGNOSIS — Z981 Arthrodesis status: Secondary | ICD-10-CM | POA: Diagnosis not present

## 2021-01-03 DIAGNOSIS — Z888 Allergy status to other drugs, medicaments and biological substances status: Secondary | ICD-10-CM | POA: Diagnosis not present

## 2021-01-03 LAB — URINALYSIS, ROUTINE W REFLEX MICROSCOPIC
Bilirubin Urine: NEGATIVE
Glucose, UA: NEGATIVE mg/dL
Hgb urine dipstick: NEGATIVE
Ketones, ur: 5 mg/dL — AB
Leukocytes,Ua: NEGATIVE
Nitrite: NEGATIVE
Protein, ur: NEGATIVE mg/dL
Specific Gravity, Urine: 1.026 (ref 1.005–1.030)
pH: 6 (ref 5.0–8.0)

## 2021-01-03 LAB — SARS CORONAVIRUS 2 (TAT 6-24 HRS): SARS Coronavirus 2: NEGATIVE

## 2021-01-03 MED ORDER — KETOROLAC TROMETHAMINE 15 MG/ML IJ SOLN
15.0000 mg | Freq: Four times a day (QID) | INTRAMUSCULAR | Status: DC | PRN
  Administered 2021-01-03 (×2): 15 mg via INTRAVENOUS
  Filled 2021-01-03 (×2): qty 1

## 2021-01-03 MED ORDER — SODIUM CHLORIDE 0.9 % IV SOLN: INTRAVENOUS | Status: DC

## 2021-01-03 MED ORDER — ALBUTEROL SULFATE (2.5 MG/3ML) 0.083% IN NEBU: 2.5000 mg | INHALATION_SOLUTION | Freq: Four times a day (QID) | RESPIRATORY_TRACT | Status: DC | PRN

## 2021-01-03 MED ORDER — APIXABAN 5 MG PO TABS: 5.0000 mg | ORAL_TABLET | Freq: Two times a day (BID) | ORAL | Status: DC

## 2021-01-03 MED ORDER — PROPAFENONE HCL 225 MG PO TABS
225.0000 mg | ORAL_TABLET | Freq: Three times a day (TID) | ORAL | Status: DC
  Filled 2021-01-03: qty 1

## 2021-01-03 MED ORDER — ONDANSETRON 4 MG PO TBDP
4.0000 mg | ORAL_TABLET | Freq: Every day | ORAL | Status: DC | PRN
  Administered 2021-01-03 (×2): 4 mg via ORAL
  Filled 2021-01-03 (×2): qty 1

## 2021-01-03 MED ORDER — VENLAFAXINE HCL ER 150 MG PO CP24
150.0000 mg | ORAL_CAPSULE | Freq: Every day | ORAL | Status: DC
  Filled 2021-01-03: qty 1

## 2021-01-03 MED ORDER — PROPAFENONE HCL 225 MG PO TABS
225.0000 mg | ORAL_TABLET | Freq: Three times a day (TID) | ORAL | Status: DC
  Administered 2021-01-04: 225 mg via ORAL
  Filled 2021-01-03: qty 1

## 2021-01-03 MED ORDER — METOPROLOL TARTRATE 12.5 MG HALF TABLET
12.5000 mg | ORAL_TABLET | Freq: Two times a day (BID) | ORAL | Status: DC
  Administered 2021-01-04: 12.5 mg via ORAL
  Filled 2021-01-03: qty 1

## 2021-01-03 MED ORDER — VENLAFAXINE HCL ER 75 MG PO CP24
150.0000 mg | ORAL_CAPSULE | Freq: Every day | ORAL | Status: DC
  Administered 2021-01-04: 150 mg via ORAL
  Filled 2021-01-03: qty 2

## 2021-01-03 MED ORDER — DIATRIZOATE MEGLUMINE & SODIUM 66-10 % PO SOLN
90.0000 mL | Freq: Once | ORAL | Status: AC
  Administered 2021-01-03: 90 mL via NASOGASTRIC
  Filled 2021-01-03: qty 90

## 2021-01-03 MED ORDER — ACETAMINOPHEN 325 MG PO TABS
650.0000 mg | ORAL_TABLET | Freq: Four times a day (QID) | ORAL | Status: DC | PRN
  Administered 2021-01-03: 650 mg via ORAL
  Filled 2021-01-03: qty 2

## 2021-01-03 MED ORDER — TAMSULOSIN HCL 0.4 MG PO CAPS
0.4000 mg | ORAL_CAPSULE | Freq: Every day | ORAL | Status: DC
  Administered 2021-01-04: 0.4 mg via ORAL
  Filled 2021-01-03: qty 1

## 2021-01-03 MED ORDER — ENOXAPARIN SODIUM 40 MG/0.4ML IJ SOSY
40.0000 mg | PREFILLED_SYRINGE | INTRAMUSCULAR | Status: DC
  Administered 2021-01-04: 40 mg via SUBCUTANEOUS
  Filled 2021-01-03: qty 0.4

## 2021-01-03 MED ORDER — METOPROLOL TARTRATE 25 MG PO TABS
12.5000 mg | ORAL_TABLET | Freq: Two times a day (BID) | ORAL | Status: DC
  Administered 2021-01-03: 12.5 mg via ORAL
  Filled 2021-01-03 (×2): qty 1

## 2021-01-03 MED ORDER — TAMSULOSIN HCL 0.4 MG PO CAPS
0.4000 mg | ORAL_CAPSULE | Freq: Every day | ORAL | Status: DC
  Filled 2021-01-03: qty 1

## 2021-01-03 NOTE — Progress Notes (Signed)
  PROGRESS NOTE    Thomas Dominguez  BOE:[id number] DOB: 03-12-1946 DOA: 01/02/2021  PCP: Wells Guiles, DO    LOS - 0    Patient admitted after midnight with recurrent small bowel obstruction.  Interval subjective: Notified by ED RN this AM that NG canister emptied at 3am was brown contents, now with red blood draining. When I rounded on patient, NG tube clamped, no red blood in tubing.  Pt reported it seemed to resolve.  He is passing gas already which he states typically does not happen until days 2 or 3.  Exam: Awake, alert and oriented x3, no acute distress.  Abdomen is soft and nontender with bowel sounds in 3 of 4 quadrants (not appreciated in the right upper quadrant) heart sounds regular rate and rhythm, lungs clear bilaterally and normal respiratory effort on room air.  No peripheral edema   Active Problems:   Atrial fibrillation (HCC)   BPH (benign prostatic hyperplasia)   Hyperlipidemia   SBO (small bowel obstruction) ([place])    I have reviewed the full H&P by Dr. Linda Hedges in detail, and I agree with the assessment and plan as outlined therein. In addition: --Hold Lovenox given blood seen in NGT output --Follow-up general surgery recommendations  No Charge    Ezekiel Slocumb, DO [facility]   If 7PM-7AM, please contact night-coverage [email or web address] 01/03/2021, 7:30 AM

## 2021-01-03 NOTE — Consult Note (Signed)
Reason for Consult:sbo Referring Physician: Dr Fae Pippin L. [name] is an 75 y.o. male.  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 Remains NED.  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 During one of these with robot he had small bowel injury that required resection and is source of his anastomosis. He has had several sbo magnaged conservatively over past years.  Last was last May and was treated here.  He had normal  bm satruday and then developed abdominal pain, dry heaves, nausea yesterday. He has had some flatus this am.  His abodminal pain is better.  He has ng in place and functional . We were asked by medicine to see him.   Ct shows likely obstruction at level of sb anastomosis.  Has large hh.   Wbc was normal.      Past Medical History:  Diagnosis Date   Atrial fibrillation ([place]) 01/17/2019   BPH (benign prostatic hyperplasia) 01/17/2019   Degenerative disc disease, lumbar 01/17/2019   Depression, major, single episode, complete remission ([place]) 01/17/2019   Diverticulosis 01/17/2019   Dysrhythmia    Esophageal cancer ([facility])    GERD (gastroesophageal reflux disease)    H/O small bowel obstruction 01/17/2019   Hiatal hernia 01/17/2019   Hypertension    Pneumonia 11/2019   SBO (small bowel obstruction) ([place]) 11/20/2019   Wears glasses     Past Surgical History:  Procedure Laterality Date   ABDOMINAL HERNIA REPAIR  2012, 2014, 2017   ventral   BRONCHIAL BIOPSY  04/08/2020   Procedure: BRONCHIAL BIOPSIES;  Surgeon: Marshell Garfinkel, MD;  Location: [facility];  Service: Cardiopulmonary;;   BRONCHIAL BRUSHINGS  04/08/2020   Procedure: BRONCHIAL BRUSHINGS;  Surgeon: Marshell Garfinkel, MD;  Location: [facility];  Service: Cardiopulmonary;;   BRONCHIAL WASHINGS  04/08/2020   Procedure: BRONCHIAL WASHINGS;  Surgeon: Marshell Garfinkel, MD;  Location: [facility];  Service:  Cardiopulmonary;;   COLONOSCOPY  2019   ESOPHAGECTOMY  1997   for esophageal cancer   ESOPHAGOGASTRODUODENOSCOPY (EGD) WITH PROPOFOL N/A 06/11/2020   Procedure: ESOPHAGOGASTRODUODENOSCOPY (EGD) WITH PROPOFOL;  Surgeon: Mauri Pole, MD;  Location: [facility];  Service: Endoscopy;  Laterality: N/A;   PARATHYROIDECTOMY Right 01/26/2020   Procedure: RIGHT INFERIOR PARATHYROIDECTOMY;  Surgeon: [name], MD;  Location: [facility];  Service: General;  Laterality: Right;   SPINAL FUSION  2018, 2020   L2 L5 in 2018, L5-S1 2020   VIDEO BRONCHOSCOPY N/A 04/08/2020   Procedure: VIDEO BRONCHOSCOPY WITH FLUORO;  Surgeon: Marshell Garfinkel, MD;  Location: [facility];  Service: Cardiopulmonary;  Laterality: N/A;    Family History  Problem Relation Age of Onset   Emphysema Father        smoker   GER disease Daughter    GER disease Son    GER disease Mother    Lung cancer Sister        never smoked   Colon cancer Neg Hx    Stomach cancer Neg Hx     Social History:  reports that he has never smoked. He has never used smokeless tobacco. He reports current alcohol use of about 7.0 standard drinks of alcohol per week. He reports that he does not use drugs.  Allergies:  Allergies  Allergen Reactions   Mobic [Meloxicam] Other (See Comments)    SOB and muscle pain   Statins Other (See Comments)    Muscle pain  Medications: I have reviewed the patient's current medications.  Results for orders placed or performed during the hospital encounter of 01/02/21 (from the past 48 hour(s))  Lipase, blood     Status: None   Collection Time: 01/02/21  7:58 PM  Result Value Ref Range   Lipase 28 11 - 51 U/L    Comment: Performed at [facility], [place] [address], [place], [place] [address]  Comprehensive metabolic panel     Status: Abnormal   Collection Time: 01/02/21  7:58 PM  Result Value Ref Range   Sodium 136 135 - 145 mmol/L   Potassium 3.9 3.5 - 5.1 mmol/L   Chloride 100 98 - 111  mmol/L   CO2 26 22 - 32 mmol/L   Glucose, Bld 135 (H) 70 - 99 mg/dL    Comment: Glucose reference range applies only to samples taken after fasting for at least 8 hours.   BUN 10 8 - 23 mg/dL   Creatinine, Ser 0.98 0.61 - 1.24 mg/dL   Calcium 9.2 8.9 - 10.3 mg/dL   Total Protein 6.4 (L) 6.5 - 8.1 g/dL   Albumin 3.4 (L) 3.5 - 5.0 g/dL   AST 23 15 - 41 U/L   ALT 30 0 - 44 U/L   Alkaline Phosphatase 90 38 - 126 U/L   Total Bilirubin 0.5 0.3 - 1.2 mg/dL   GFR, Estimated >60 >60 mL/min    Comment: (NOTE) Calculated using the CKD-EPI Creatinine Equation (2021)    Anion gap 10 5 - 15    Comment: Performed at [facility], [place] [address] [place] [address]  CBC     Status: None   Collection Time: 01/02/21  7:58 PM  Result Value Ref Range   WBC 9.5 4.0 - 10.5 K/uL   RBC 5.01 4.22 - 5.81 MIL/uL   Hemoglobin 16.1 13.0 - 17.0 g/dL   HCT 48.3 39.0 - 52.0 %   MCV 96.4 80.0 - 100.0 fL   MCH 32.1 26.0 - 34.0 pg   MCHC 33.3 30.0 - 36.0 g/dL   RDW 12.5 11.5 - 15.5 %   Platelets 208 150 - 400 K/uL   nRBC 0.0 0.0 - 0.2 %    Comment: Performed at [facility], [place] [address], [place], [place] [address]  SARS CORONAVIRUS 2 (TAT 6-24 HRS) Nasopharyngeal Nasopharyngeal Swab     Status: None   Collection Time: 01/02/21 11:46 PM   Specimen: Nasopharyngeal Swab  Result Value Ref Range   SARS Coronavirus 2 NEGATIVE NEGATIVE    Comment: (NOTE) SARS-CoV-2 target nucleic acids are NOT DETECTED.  The SARS-CoV-2 RNA is generally detectable in upper and lower respiratory specimens during the acute phase of infection. Negative results do not preclude SARS-CoV-2 infection, do not rule out co-infections with other pathogens, and should not be used as the sole basis for treatment or other patient management decisions. Negative results must be combined with clinical observations, patient history, and epidemiological information. The expected result is Negative.  Fact Sheet for  Patients: SugarRoll.be  Fact Sheet for Healthcare Providers: https://www.woods-mathews.com/  This test is not yet approved or cleared by the [place] FDA and  has been authorized for detection and/or diagnosis of SARS-CoV-2 by FDA under an Emergency Use Authorization (EUA). This EUA will remain  in effect (meaning this test can be used) for the duration of the COVID-19 declaration under Se ction 564(b)(1) of the Act, 21 U.S.C. section 360bbb-3(b)(1), unless the authorization  is terminated or revoked sooner.  Performed at [facility], [place] [address] [place], [place] [address]     CT Abdomen Pelvis Wo Contrast  Result Date: 01/02/2021 CLINICAL DATA:  Bowel obstruction suspected EXAM: CT ABDOMEN AND PELVIS WITHOUT CONTRAST TECHNIQUE: Multidetector CT imaging of the abdomen and pelvis was performed following the standard protocol without IV contrast. COMPARISON:  Radiograph 01/02/2021, upper GI 05/17/2020, CT chest 03/12/2020, CT abdomen pelvis 11/20/2019 FINDINGS: Lower chest: Redemonstration of some irregular regions of peribronchovascular consolidation and ground-glass opacity in the lungs in a very similar distribution to prior exam. Large hiatal hernia is again noted containing much of the stomach as well as portion of the transverse colon. Normal heart size. No pericardial effusion. Hepatobiliary: Stable appearance of a fluid attenuation structure in the posterior right lobe liver measuring up to 2.5 cm, probable benign cyst. No concerning focal liver lesion. Smooth liver surface contour. Normal gallbladder and biliary tree. Pancreas: Partial fatty replacement of the pancreas displaced into the large hiatal hernia as seen previously. No pancreatic ductal dilatation or surrounding inflammatory changes. Spleen: Normal in size. No concerning splenic lesions. Adrenals/Urinary Tract: Normal adrenals. Mild bilateral perinephric stranding is  unchanged from comparison prior. No concerning focal renal lesion. No urolithiasis or hydronephrosis. Mild bladder wall thickening with slight indentation of the bladder base by an enlarged prostate. Stomach/Bowel: Large hiatal hernia containing fluid, some mild wall thickening is nonspecific. Duodenum with a normal course across the midline abdomen to the ligament of Treitz. Air and fluid distention of much of the proximal small bowel to the level of some fecalized contents at a small bowel-small bowel anastomosis in the left lower quadrant. More distal small bowel/terminal ileum beyond this point is relatively decompressed. A normal appendix is visualized. Portion of the distal transverse colon contained within the large hiatal hernia sac. Scattered colonic diverticula without focal inflammation to suggest diverticulitis. Segmental thickening of the sigmoid colon without convincing acute inflammatory change at this time, can reflect sequela of prior diverticular inflammation. No extraluminal gas or organized collection. Vascular/Lymphatic: No significant vascular findings are present. No enlarged abdominal or pelvic lymph nodes. Reproductive: Prostatomegaly. Few eccentric calcifications of the prostate. Seminal vesicles are unremarkable. Other: Large hiatal hernia with postsurgical changes suggesting prior attempted surgical repair. Additional postsurgical changes from prior ventral hernia repair. Bilateral fat containing inguinal hernias. No bowel containing hernias. No abdominopelvic free air or fluid. Musculoskeletal: Prior L2-S1 PLIF with unchanged appearance of the hardware including subsidence of the S1 superior endplate. Background of diffuse multilevel degenerative changes in the spine with dextrocurvature of the thoracolumbar junction and gradual lumbar levocurvature, apex L3. Additional degenerative changes in the hips and pelvis. IMPRESSION: Appearance suggesting a possible partial or early developing  small bowel obstruction with possible transition towards the site of small bowel anastomosis in the left lower quadrant where fecalized contents may reflect a small bowel feces sign. More distal segments of small bowel are decompressed beyond this level. Large hiatal hernia containing much of the stomach, portion of the distal transverse colon, and pancreas. No resulting obstruction or complication is seen at this level acutely. Evidence of prior surgical change at the diaphragmatic hiatus. Segmental thickening of the sigmoid but without additional features of active diverticular inflammation at this time, could reflect sequela of prior inflammatory change versus a chronic or smoldering inflammatory process such as segmental colitis associated with diverticulosis. Correlate with clinical symptoms and consider outpatient evaluation with direct visualization if not recently performed. Mild bladder wall  thickening with enlarged prostate. Could reflect sequela of chronic outlet obstruction though should correlate with urinalysis to exclude cystitis. Irregular regions of peribronchovascular consolidation and ground-glass in the lung bases which could reflect a systemic process such as sarcoidosis or sequela of chronic aspiration particularly given the large hiatal hernia. Overall, appearance is not significantly changed from comparison prior HRCT imaging. Electronically Signed   By: Lovena Le M.D.   On: 01/02/2021 22:55   DG ABD ACUTE 2+V W 1V CHEST  Result Date: 01/02/2021 CLINICAL DATA:  Small bowel obstruction suspected EXAM: DG ABDOMEN ACUTE WITH 1 VIEW CHEST COMPARISON:  May 17, 2020 FINDINGS: There is no evidence of dilated bowel loops or free intraperitoneal air. No radiopaque calculi or other significant radiographic abnormality is seen. Heart size and mediastinal contours are within normal limits. Hernia repair tacks overlie the abdomen. Prior lumbosacral fusion. Bibasilar atelectasis. IMPRESSION:  Nonobstructive bowel gas.  No acute cardiopulmonary disease. Electronically Signed   By: Dahlia Bailiff MD   On: 01/02/2021 20:44   DG Abd Portable 1V  Result Date: 01/03/2021 CLINICAL DATA:  Check gastric catheter placement EXAM: PORTABLE ABDOMEN - 1 VIEW COMPARISON:  Film from earlier in the same day. FINDINGS: Gastric catheter is been placed and extends over the lower aspect of the right lung within the gastric pull-through seen on recent CT examination. This is not within the right right-sided bronchial tree. No obstructive changes are noted. IMPRESSION: Gastric catheter within gastric pull-through overlying the lower right chest. Electronically Signed   By: Inez Catalina M.D.   On: 01/03/2021 00:06    Review of Systems  Gastrointestinal:  Positive for abdominal distention, abdominal pain, nausea and vomiting.  Blood pressure 116/80, pulse 77, temperature 98.4 F (36.9 C), temperature source Oral, resp. rate 12, height 6' (1.829 m), weight 96.2 kg, SpO2 97 %. Physical Exam Constitutional:      Appearance: He is well-developed.  HENT:     Head: Normocephalic and atraumatic.     Mouth/Throat:     Mouth: Mucous membranes are moist.  Eyes:     General: No scleral icterus. Cardiovascular:     Rate and Rhythm: Normal rate and regular rhythm.  Pulmonary:     Effort: Pulmonary effort is normal.     Breath sounds: Normal breath sounds.  Abdominal:     General: A surgical scar is present. Bowel sounds are decreased.     Palpations: Abdomen is soft.     Hernia: No hernia is present.  Skin:    General: Skin is warm and dry.  Neurological:     General: No focal deficit present.     Mental Status: He is alert.  Psychiatric:        Mood and Affect: Mood normal.        Behavior: Behavior normal.    Assessment/Plan: SBO -likely adhesive given history, has resolved before -plan protocol, ng tube/contrast -with flatus already hopefully resolves quickly.  Thomas Dominguez 01/03/2021, 8:27  AM

## 2021-01-03 NOTE — ED Notes (Signed)
Paged Transport for hospital bed

## 2021-01-03 NOTE — ED Notes (Signed)
MD paged regarding pt's request for PRN pain medication

## 2021-01-03 NOTE — H&P (Signed)
History and Physical    Thomas Dominguez NFA:[id number] DOB: August 03, 1945 DOA: 01/02/2021  PCP: Wells Guiles, DO (Confirm with patient/family/NH records and if not entered, this has to be entered at [facility] point of entry) Patient coming from: home  I have personally briefly reviewed patient's old medical records in [address]  Chief Complaint: abdominal pain with bloating similar to previous SBO episodes  HPI: Thomas Dominguez is a 75 y.o. male with medical history significant of atrial fibrillation on Eliquis, BPH, GERD, hypertension and prior small bowel obstructions who presents with abdominal pain and distention.  He reports that throughout today he developed some cramping in his abdomen with distention and increasing pain.  He has had some nausea and dry heaves.  He has a associated headache.  He had a bowel movement yesterday but has not had one today and has not been passing gas today.  He has had 5 prior small bowel obstructions he states that are from prior scar tissue.  He says his symptoms feel similar to his prior small bowel obstructions.  He states that they have always resolved with NG tube decompression and he has not had recurrent surgery for a small bowel obstruction. (  ED Course: 98.1   127/74  HR 71  RR 14. EDP exam notable for abdominal distention, tenderness, tympanitic to percussion. Lab Cmet nl except glucose 135 CBC nl. CT - early vs developing SBO withpossible transition at site of small bowel anastomosis. Patient given pain medication. NG placed and run to low suction. TRH called to admit for continued management.  Review of Systems: As per HPI otherwise 10 point review of systems negative.    Past Medical History:  Diagnosis Date   Atrial fibrillation ([place]) 01/17/2019   BPH (benign prostatic hyperplasia) 01/17/2019   Degenerative disc disease, lumbar 01/17/2019   Depression, major, single episode, complete remission ([place]) 01/17/2019   Diverticulosis 01/17/2019    Dysrhythmia    Esophageal cancer (HCC)    GERD (gastroesophageal reflux disease)    H/O small bowel obstruction 01/17/2019   Hiatal hernia 01/17/2019   Hypertension    Pneumonia 11/2019   SBO (small bowel obstruction) ([place]) 11/20/2019   Wears glasses     Past Surgical History:  Procedure Laterality Date   ABDOMINAL HERNIA REPAIR  2012, 2014, 2017   ventral   BRONCHIAL BIOPSY  04/08/2020   Procedure: BRONCHIAL BIOPSIES;  Surgeon: Marshell Garfinkel, MD;  Location: [facility];  Service: Cardiopulmonary;;   BRONCHIAL BRUSHINGS  04/08/2020   Procedure: BRONCHIAL BRUSHINGS;  Surgeon: Marshell Garfinkel, MD;  Location: [facility];  Service: Cardiopulmonary;;   BRONCHIAL WASHINGS  04/08/2020   Procedure: BRONCHIAL WASHINGS;  Surgeon: Marshell Garfinkel, MD;  Location: [facility];  Service: Cardiopulmonary;;   COLONOSCOPY  2019   ESOPHAGECTOMY  1997   for esophageal cancer   ESOPHAGOGASTRODUODENOSCOPY (EGD) WITH PROPOFOL N/A 06/11/2020   Procedure: ESOPHAGOGASTRODUODENOSCOPY (EGD) WITH PROPOFOL;  Surgeon: Mauri Pole, MD;  Location: [facility];  Service: Endoscopy;  Laterality: N/A;   PARATHYROIDECTOMY Right 01/26/2020   Procedure: RIGHT INFERIOR PARATHYROIDECTOMY;  Surgeon: [name], MD;  Location: [facility];  Service: General;  Laterality: Right;   SPINAL FUSION  2018, 2020   L2 L5 in 2018, L5-S1 2020   VIDEO BRONCHOSCOPY N/A 04/08/2020   Procedure: VIDEO BRONCHOSCOPY WITH FLUORO;  Surgeon: Marshell Garfinkel, MD;  Location: [facility];  Service: Cardiopulmonary;  Laterality: N/A;    Soc Hx - married 42 years. Wife in good health but  just had THR. He has 1 son, 1 daughter. 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    reports that he has never smoked. He has never used smokeless tobacco. He reports current alcohol use of about 7.0 standard drinks of alcohol per week. He reports that  he does not use drugs.  Allergies  Allergen Reactions   Mobic [Meloxicam] Other (See Comments)    SOB and muscle pain   Statins Other (See Comments)    Muscle pain    Family History  Problem Relation Age of Onset   Emphysema Father        smoker   GER disease Daughter    GER disease Son    GER disease Mother    Lung cancer Sister        never smoked   Colon cancer Neg Hx    Stomach cancer Neg Hx      Prior to Admission medications   Medication Sig Start Date End Date Taking? Authorizing Provider  albuterol (VENTOLIN HFA) 108 (90 Base) MCG/ACT inhaler Inhale 2 puffs into the lungs every 6 (six) hours as needed. Patient taking differently: Inhale 2 puffs into the lungs every 6 (six) hours as needed for wheezing or shortness of breath. 03/17/20  Yes Spero Geralds, MD  apixaban (ELIQUIS) 5 MG TABS tablet Take 1 tablet (5 mg total) by mouth 2 (two) times daily. 05/18/20  Yes Donato Heinz, MD  famotidine (PEPCID) 20 MG tablet TAKE 1 TABLET BY MOUTH EVERYDAY AT BEDTIME Patient taking differently: Take 20 mg by mouth at bedtime. 06/28/20  Yes Nandigam, [name], MD  methocarbamol (ROBAXIN) 750 MG tablet TAKE 1 TABLET TWICE DAILY AS NEEDED FOR MUSCLE SPASM(S) Patient taking differently: Take 750 mg by mouth 2 (two) times daily as needed for muscle spasms. 12/13/20  Yes Benay Pike, MD  metoprolol tartrate (LOPRESSOR) 25 MG tablet Take 0.5 tablets (12.5 mg total) by mouth 2 (two) times daily. 12/29/20  Yes Donato Heinz, MD  Multiple Vitamins-Minerals (CENTRUM SILVER 50+MEN) TABS Take 1 tablet by mouth daily.   Yes [provider]  omeprazole (PRILOSEC) 40 MG capsule TAKE 1 CAPSULE TWICE DAILY Patient taking differently: Take 40 mg by mouth in the morning and at bedtime. 12/15/20  Yes Nandigam, [name], MD  ondansetron (ZOFRAN ODT) 4 MG disintegrating tablet Take 1 tablet (4 mg total) by mouth daily as needed for nausea or vomiting. 11/25/20  Yes Nandigam,  [name], MD  propafenone (RYTHMOL) 225 MG tablet TAKE 1 TABLET (225 MG TOTAL) BY MOUTH EVERY 8 HOURS. Patient taking differently: Take 225 mg by mouth 3 (three) times daily. 01/13/20  Yes Donato Heinz, MD  tamsulosin (FLOMAX) 0.4 MG CAPS capsule Take 1 capsule (0.4 mg total) by mouth in the morning and at bedtime. 05/27/20  Yes Benay Pike, MD  traMADol (ULTRAM) 50 MG tablet TAKE 1 TABLET BY MOUTH EVERY 12 HOURS AS NEEDED FOR MODERATE PAIN. Patient taking differently: Take 50 mg by mouth every 12 (twelve) hours as needed for moderate pain. 11/02/20  Yes Benay Pike, MD  venlafaxine XR (EFFEXOR-XR) 150 MG 24 hr capsule TAKE 1 CAPSULE EVERY DAY WITH BREAKFAST Patient taking differently: Take 150 mg by mouth daily with breakfast. 11/16/20  Yes Benay Pike, MD  diltiazem [facility]) 120 MG 24 hr capsule Take 120 mg by mouth daily. Patient  not taking: No sig reported 11/19/18   [provider]    Physical Exam: Vitals:   01/02/21 2300 01/02/21 2330 01/02/21 2345 01/03/21 0015  BP: 135/89 108/62 107/72 127/74  Pulse: 63 63 67 71  Resp: 16 17 12 14   Temp:      TempSrc:      SpO2: 98% 98% 97% 96%  Weight:      Height:         Vitals:   01/02/21 2300 01/02/21 2330 01/02/21 2345 01/03/21 0015  BP: 135/89 108/62 107/72 127/74  Pulse: 63 63 67 71  Resp: 16 17 12 14   Temp:      TempSrc:      SpO2: 98% 98% 97% 96%  Weight:      Height:       General:  WNWD man in mild discomfort. Eyes: PERRL, lids and conjunctivae normal ENMT: Mucous membranes are moist. Posterior pharynx clear of any exudate or lesions.Normal dentition.  Neck: normal, supple, no masses, no thyromegaly Respiratory: clear to auscultation bilaterally, no wheezing, no crackles. Normal respiratory effort. No accessory muscle use.  Cardiovascular: Regular rate and rhythm, no murmurs / rubs / gallops. No extremity edema. 2+ pedal pulses. No carotid bruits.  Abdomen: Distended, absent BS, firm,  generalized tenderness to light palpation. Very tender to percussion. Mild guarding.  Musculoskeletal: no clubbing / cyanosis. No joint deformity upper and lower extremities. Good ROM, no contractures. Normal muscle tone.  Skin: no rashes, lesions, ulcers. No induration Neurologic: CN 2-12 grossly intact. Sensation intact, DTR normal. Strength 5/5 in all 4.  Psychiatric: Normal judgment and insight. Alert and oriented x 3. Normal mood.     Labs on Admission: I have personally reviewed following labs and imaging studies  CBC: Recent Labs  Lab 01/02/21 1958  WBC 9.5  HGB 16.1  HCT 48.3  MCV 96.4  PLT 253   Basic Metabolic Panel: Recent Labs  Lab 01/02/21 1958  NA 136  K 3.9  CL 100  CO2 26  GLUCOSE 135*  BUN 10  CREATININE 0.98  CALCIUM 9.2   GFR: Estimated Creatinine Clearance: 79.5 mL/min (by C-G formula based on SCr of 0.98 mg/dL). Liver Function Tests: Recent Labs  Lab 01/02/21 1958  AST 23  ALT 30  ALKPHOS 90  BILITOT 0.5  PROT 6.4*  ALBUMIN 3.4*   Recent Labs  Lab 01/02/21 1958  LIPASE 28   No results for input(s): AMMONIA in the last 168 hours. Coagulation Profile: No results for input(s): INR, PROTIME in the last 168 hours. Cardiac Enzymes: No results for input(s): CKTOTAL, CKMB, CKMBINDEX, TROPONINI in the last 168 hours. BNP (last 3 results) No results for input(s): PROBNP in the last 8760 hours. HbA1C: No results for input(s): HGBA1C in the last 72 hours. CBG: No results for input(s): GLUCAP in the last 168 hours. Lipid Profile: No results for input(s): CHOL, HDL, LDLCALC, TRIG, CHOLHDL, LDLDIRECT in the last 72 hours. Thyroid Function Tests: No results for input(s): TSH, T4TOTAL, FREET4, T3FREE, THYROIDAB in the last 72 hours. Anemia Panel: No results for input(s): VITAMINB12, FOLATE, FERRITIN, TIBC, IRON, RETICCTPCT in the last 72 hours. Urine analysis: No results found for: COLORURINE, APPEARANCEUR, LABSPEC, [place], GLUCOSEU, HGBUR,  BILIRUBINUR, KETONESUR, PROTEINUR, UROBILINOGEN, NITRITE, LEUKOCYTESUR  Radiological Exams on Admission: CT Abdomen Pelvis Wo Contrast  Result Date: 01/02/2021 CLINICAL DATA:  Bowel obstruction suspected EXAM: CT ABDOMEN AND PELVIS WITHOUT CONTRAST TECHNIQUE: Multidetector CT imaging of the abdomen and pelvis was performed following the  standard protocol without IV contrast. COMPARISON:  Radiograph 01/02/2021, upper GI 05/17/2020, CT chest 03/12/2020, CT abdomen pelvis 11/20/2019 FINDINGS: Lower chest: Redemonstration of some irregular regions of peribronchovascular consolidation and ground-glass opacity in the lungs in a very similar distribution to prior exam. Large hiatal hernia is again noted containing much of the stomach as well as portion of the transverse colon. Normal heart size. No pericardial effusion. Hepatobiliary: Stable appearance of a fluid attenuation structure in the posterior right lobe liver measuring up to 2.5 cm, probable benign cyst. No concerning focal liver lesion. Smooth liver surface contour. Normal gallbladder and biliary tree. Pancreas: Partial fatty replacement of the pancreas displaced into the large hiatal hernia as seen previously. No pancreatic ductal dilatation or surrounding inflammatory changes. Spleen: Normal in size. No concerning splenic lesions. Adrenals/Urinary Tract: Normal adrenals. Mild bilateral perinephric stranding is unchanged from comparison prior. No concerning focal renal lesion. No urolithiasis or hydronephrosis. Mild bladder wall thickening with slight indentation of the bladder base by an enlarged prostate. Stomach/Bowel: Large hiatal hernia containing fluid, some mild wall thickening is nonspecific. Duodenum with a normal course across the midline abdomen to the ligament of Treitz. Air and fluid distention of much of the proximal small bowel to the level of some fecalized contents at a small bowel-small bowel anastomosis in the left lower quadrant. More  distal small bowel/terminal ileum beyond this point is relatively decompressed. A normal appendix is visualized. Portion of the distal transverse colon contained within the large hiatal hernia sac. Scattered colonic diverticula without focal inflammation to suggest diverticulitis. Segmental thickening of the sigmoid colon without convincing acute inflammatory change at this time, can reflect sequela of prior diverticular inflammation. No extraluminal gas or organized collection. Vascular/Lymphatic: No significant vascular findings are present. No enlarged abdominal or pelvic lymph nodes. Reproductive: Prostatomegaly. Few eccentric calcifications of the prostate. Seminal vesicles are unremarkable. Other: Large hiatal hernia with postsurgical changes suggesting prior attempted surgical repair. Additional postsurgical changes from prior ventral hernia repair. Bilateral fat containing inguinal hernias. No bowel containing hernias. No abdominopelvic free air or fluid. Musculoskeletal: Prior L2-S1 PLIF with unchanged appearance of the hardware including subsidence of the S1 superior endplate. Background of diffuse multilevel degenerative changes in the spine with dextrocurvature of the thoracolumbar junction and gradual lumbar levocurvature, apex L3. Additional degenerative changes in the hips and pelvis. IMPRESSION: Appearance suggesting a possible partial or early developing small bowel obstruction with possible transition towards the site of small bowel anastomosis in the left lower quadrant where fecalized contents may reflect a small bowel feces sign. More distal segments of small bowel are decompressed beyond this level. Large hiatal hernia containing much of the stomach, portion of the distal transverse colon, and pancreas. No resulting obstruction or complication is seen at this level acutely. Evidence of prior surgical change at the diaphragmatic hiatus. Segmental thickening of the sigmoid but without additional  features of active diverticular inflammation at this time, could reflect sequela of prior inflammatory change versus a chronic or smoldering inflammatory process such as segmental colitis associated with diverticulosis. Correlate with clinical symptoms and consider outpatient evaluation with direct visualization if not recently performed. Mild bladder wall thickening with enlarged prostate. Could reflect sequela of chronic outlet obstruction though should correlate with urinalysis to exclude cystitis. Irregular regions of peribronchovascular consolidation and ground-glass in the lung bases which could reflect a systemic process such as sarcoidosis or sequela of chronic aspiration particularly given the large hiatal hernia. Overall, appearance is not significantly changed from comparison  prior HRCT imaging. Electronically Signed   By: Lovena Le M.D.   On: 01/02/2021 22:55   DG ABD ACUTE 2+V W 1V CHEST  Result Date: 01/02/2021 CLINICAL DATA:  Small bowel obstruction suspected EXAM: DG ABDOMEN ACUTE WITH 1 VIEW CHEST COMPARISON:  May 17, 2020 FINDINGS: There is no evidence of dilated bowel loops or free intraperitoneal air. No radiopaque calculi or other significant radiographic abnormality is seen. Heart size and mediastinal contours are within normal limits. Hernia repair tacks overlie the abdomen. Prior lumbosacral fusion. Bibasilar atelectasis. IMPRESSION: Nonobstructive bowel gas.  No acute cardiopulmonary disease. Electronically Signed   By: Dahlia Bailiff MD   On: 01/02/2021 20:44   DG Abd Portable 1V  Result Date: 01/03/2021 CLINICAL DATA:  Check gastric catheter placement EXAM: PORTABLE ABDOMEN - 1 VIEW COMPARISON:  Film from earlier in the same day. FINDINGS: Gastric catheter is been placed and extends over the lower aspect of the right lung within the gastric pull-through seen on recent CT examination. This is not within the right right-sided bronchial tree. No obstructive changes are noted.  IMPRESSION: Gastric catheter within gastric pull-through overlying the lower right chest. Electronically Signed   By: Inez Catalina M.D.   On: 01/03/2021 00:06    EKG: Independently reviewed. NSR, no acute changes  Assessment/Plan Active Problems:   SBO (small bowel obstruction) (HCC)   Atrial fibrillation (HCC)   BPH (benign prostatic hyperplasia)   Hyperlipidemia  SBO - patient with 5 prior episodes of SBO all of which resolved spontaneously. His original injury was by [employer] error of nicking small bowel leading to resection and anasotomosis. Now with recurrence.  Plan NG to low suction  F/u KUP at noon  NPO w/ sips with meds  Hold Eliquis  General surgery consult  Ketorolac IV for pain q 6  Zofran ODT for nausea  2. A. Fib - in sinus rhythm.CHADSvASC2 was 1 - will hold eliquis and use SQ lovenox  3. BPH - continue home meds.  DVT prophylaxis: Lovenox  Code Status: full code  Family Communication: patient preferred his wife not be disturbed at this hour.  Disposition Plan: home when stable  Consults called: GS - secure chat message to Dr. Bobbye Morton - on call-for consult later today  Admission status: inpatient    Adella Hare MD [facility] Pager [phone]  If 7PM-7AM, please contact night-coverage [email or web address] Password [facility]  01/03/2021, 1:27 AM

## 2021-01-04 ENCOUNTER — Inpatient Hospital Stay (HOSPITAL_COMMUNITY): Payer: Medicare HMO

## 2021-01-04 MED ORDER — OMEPRAZOLE 40 MG PO CPDR
40.0000 mg | DELAYED_RELEASE_CAPSULE | Freq: Two times a day (BID) | ORAL | Status: DC
Start: 2021-01-04 — End: 2021-12-19

## 2021-01-04 MED ORDER — FAMOTIDINE 20 MG PO TABS: 20.0000 mg | ORAL_TABLET | Freq: Every day | ORAL | Status: DC

## 2021-01-04 NOTE — Progress Notes (Signed)
Patient was discharged to home with instructions and spouse accompanied patient.

## 2021-01-04 NOTE — Progress Notes (Addendum)
Progress Note     Subjective: CC: walked over 11 laps in hall yesterday. Continues to pass flatus. No nausea or emesis. No BM. Abdominal pain improved but still present in LLQ  Objective: Vital signs in last 24 hours: Temp:  [98 F (36.7 C)-98.1 F (36.7 C)] 98 F (36.7 C) (07/12 0616) Pulse Rate:  [78-92] 78 (07/12 0616) Resp:  [10-18] 18 (07/12 0616) BP: (103-132)/(68-83) 132/82 (07/12 0616) SpO2:  [93 %-99 %] 96 % (07/12 0616) Last BM Date: 01/01/21  Intake/Output from previous day: 07/11 0701 - 07/12 0700 In: 1230.8 [I.V.:1230.8] Out: 1400 [Urine:450; Emesis/NG output:950] Intake/Output this shift: No intake/output data recorded.  PE: General: pleasant, WD, male who is laying in bed in NAD HEENT: head is normocephalic, atraumatic. Mouth is pink and moist Heart: Palpable radial and pedal pulses bilaterally Lungs: Respiratory effort nonlabored Abd: soft, ND, +BS, Mild TTP over LLQ.  Well healed midline incisional scar MS: all 4 extremities are symmetrical with no cyanosis, clubbing, or edema. Skin: warm and dry with no masses, lesions, or rashes Psych: A&Ox3 with an appropriate affect.    Lab Results:  Recent Labs    01/02/21 1958  WBC 9.5  HGB 16.1  HCT 48.3  PLT 208   BMET Recent Labs    01/02/21 1958  NA 136  K 3.9  CL 100  CO2 26  GLUCOSE 135*  BUN 10  CREATININE 0.98  CALCIUM 9.2   PT/INR No results for input(s): LABPROT, INR in the last 72 hours. CMP     Component Value Date/Time   NA 136 01/02/2021 1958   NA 138 05/18/2020 1253   K 3.9 01/02/2021 1958   CL 100 01/02/2021 1958   CO2 26 01/02/2021 1958   GLUCOSE 135 (H) 01/02/2021 1958   BUN 10 01/02/2021 1958   BUN 12 05/18/2020 1253   CREATININE 0.98 01/02/2021 1958   CALCIUM 9.2 01/02/2021 1958   PROT 6.4 (L) 01/02/2021 1958   PROT 6.9 05/18/2020 1253   ALBUMIN 3.4 (L) 01/02/2021 1958   ALBUMIN 4.3 05/18/2020 1253   AST 23 01/02/2021 1958   ALT 30 01/02/2021 1958   ALKPHOS  90 01/02/2021 1958   BILITOT 0.5 01/02/2021 1958   BILITOT 0.3 05/18/2020 1253   GFRNONAA >60 01/02/2021 1958   GFRAA 99 05/18/2020 1253   Lipase     Component Value Date/Time   LIPASE 28 01/02/2021 1958       Studies/Results: CT Abdomen Pelvis Wo Contrast  Result Date: 01/02/2021 CLINICAL DATA:  Bowel obstruction suspected EXAM: CT ABDOMEN AND PELVIS WITHOUT CONTRAST TECHNIQUE: Multidetector CT imaging of the abdomen and pelvis was performed following the standard protocol without IV contrast. COMPARISON:  Radiograph 01/02/2021, upper GI 05/17/2020, CT chest 03/12/2020, CT abdomen pelvis 11/20/2019 FINDINGS: Lower chest: Redemonstration of some irregular regions of peribronchovascular consolidation and ground-glass opacity in the lungs in a very similar distribution to prior exam. Large hiatal hernia is again noted containing much of the stomach as well as portion of the transverse colon. Normal heart size. No pericardial effusion. Hepatobiliary: Stable appearance of a fluid attenuation structure in the posterior right lobe liver measuring up to 2.5 cm, probable benign cyst. No concerning focal liver lesion. Smooth liver surface contour. Normal gallbladder and biliary tree. Pancreas: Partial fatty replacement of the pancreas displaced into the large hiatal hernia as seen previously. No pancreatic ductal dilatation or surrounding inflammatory changes. Spleen: Normal in size. No concerning splenic lesions. Adrenals/Urinary Tract: Normal adrenals. Mild  bilateral perinephric stranding is unchanged from comparison prior. No concerning focal renal lesion. No urolithiasis or hydronephrosis. Mild bladder wall thickening with slight indentation of the bladder base by an enlarged prostate. Stomach/Bowel: Large hiatal hernia containing fluid, some mild wall thickening is nonspecific. Duodenum with a normal course across the midline abdomen to the ligament of Treitz. Air and fluid distention of much of the  proximal small bowel to the level of some fecalized contents at a small bowel-small bowel anastomosis in the left lower quadrant. More distal small bowel/terminal ileum beyond this point is relatively decompressed. A normal appendix is visualized. Portion of the distal transverse colon contained within the large hiatal hernia sac. Scattered colonic diverticula without focal inflammation to suggest diverticulitis. Segmental thickening of the sigmoid colon without convincing acute inflammatory change at this time, can reflect sequela of prior diverticular inflammation. No extraluminal gas or organized collection. Vascular/Lymphatic: No significant vascular findings are present. No enlarged abdominal or pelvic lymph nodes. Reproductive: Prostatomegaly. Few eccentric calcifications of the prostate. Seminal vesicles are unremarkable. Other: Large hiatal hernia with postsurgical changes suggesting prior attempted surgical repair. Additional postsurgical changes from prior ventral hernia repair. Bilateral fat containing inguinal hernias. No bowel containing hernias. No abdominopelvic free air or fluid. Musculoskeletal: Prior L2-S1 PLIF with unchanged appearance of the hardware including subsidence of the S1 superior endplate. Background of diffuse multilevel degenerative changes in the spine with dextrocurvature of the thoracolumbar junction and gradual lumbar levocurvature, apex L3. Additional degenerative changes in the hips and pelvis. IMPRESSION: Appearance suggesting a possible partial or early developing small bowel obstruction with possible transition towards the site of small bowel anastomosis in the left lower quadrant where fecalized contents may reflect a small bowel feces sign. More distal segments of small bowel are decompressed beyond this level. Large hiatal hernia containing much of the stomach, portion of the distal transverse colon, and pancreas. No resulting obstruction or complication is seen at this  level acutely. Evidence of prior surgical change at the diaphragmatic hiatus. Segmental thickening of the sigmoid but without additional features of active diverticular inflammation at this time, could reflect sequela of prior inflammatory change versus a chronic or smoldering inflammatory process such as segmental colitis associated with diverticulosis. Correlate with clinical symptoms and consider outpatient evaluation with direct visualization if not recently performed. Mild bladder wall thickening with enlarged prostate. Could reflect sequela of chronic outlet obstruction though should correlate with urinalysis to exclude cystitis. Irregular regions of peribronchovascular consolidation and ground-glass in the lung bases which could reflect a systemic process such as sarcoidosis or sequela of chronic aspiration particularly given the large hiatal hernia. Overall, appearance is not significantly changed from comparison prior HRCT imaging. Electronically Signed   By: Lovena Le M.D.   On: 01/02/2021 22:55   Abd 1 View (KUB)  Result Date: 01/03/2021 CLINICAL DATA:  Evaluate contrast location. EXAM: ABDOMEN - 1 VIEW COMPARISON:  01/02/21. FINDINGS: Enteric contrast material and tip of the NG tube is in the intrathoracic stomach, right of midline in the projection of the right lower chest. Dilated loops of small bowel are again noted within the lower abdomen. Gas is noted within the colon. IMPRESSION: 1. Enteric contrast material and tip of the NG tube is in the intrathoracic stomach. 2. Persistent dilated loops of small bowel which may reflect sequelae of partial small bowel obstruction. Electronically Signed   By: Kerby Moors M.D.   On: 01/03/2021 12:54   DG ABD ACUTE 2+V W 1V CHEST  Result  Date: 01/02/2021 CLINICAL DATA:  Small bowel obstruction suspected EXAM: DG ABDOMEN ACUTE WITH 1 VIEW CHEST COMPARISON:  May 17, 2020 FINDINGS: There is no evidence of dilated bowel loops or free intraperitoneal  air. No radiopaque calculi or other significant radiographic abnormality is seen. Heart size and mediastinal contours are within normal limits. Hernia repair tacks overlie the abdomen. Prior lumbosacral fusion. Bibasilar atelectasis. IMPRESSION: Nonobstructive bowel gas.  No acute cardiopulmonary disease. Electronically Signed   By: Dahlia Bailiff MD   On: 01/02/2021 20:44   DG Abd Portable 1V  Result Date: 01/03/2021 CLINICAL DATA:  Check gastric catheter placement EXAM: PORTABLE ABDOMEN - 1 VIEW COMPARISON:  Film from earlier in the same day. FINDINGS: Gastric catheter is been placed and extends over the lower aspect of the right lung within the gastric pull-through seen on recent CT examination. This is not within the right right-sided bronchial tree. No obstructive changes are noted. IMPRESSION: Gastric catheter within gastric pull-through overlying the lower right chest. Electronically Signed   By: Inez Catalina M.D.   On: 01/03/2021 00:06    Anti-infectives: Anti-infectives (From admission, onward)    None        Assessment/Plan  SBO -likely adhesive given history, has resolved before - xray this am with read pending - on personal review there is contrast in colon - NG output 950 mL - light clear bilious - NGT out this am and start on FLD - ambulate  If tolerates diet today could be ready for discharge as early as this afternoon from surgical perspective  FEN: FLD ID: none VTE: [id number]    LOS: 1 day    Winferd Humphrey, [facility] 01/04/2021, 7:45 AM Please see Amion for pager number during day hours 7:00am-4:30pm

## 2021-01-04 NOTE — Discharge Summary (Signed)
Physician Discharge Summary  Thomas Lope. Beranek GNF:[id number] DOB: 26-Jan-1946 DOA: 01/02/2021  PCP: Wells Guiles, DO  Admit date: 01/02/2021 Discharge date: 01/04/2021  Admitted From: home Disposition:  home  Recommendations for Outpatient Follow-up:  Follow up with PCP in 1-2 weeks Please obtain BMP/CBC in one week   Home Health: no  Equipment/Devices: none  Discharge Condition: stable  CODE STATUS: full  Diet recommendation: Soft, advance as tolerated    Discharge Diagnoses: Active Problems:   Atrial fibrillation (HCC)   BPH (benign prostatic hyperplasia)   Hyperlipidemia   SBO (small bowel obstruction) (HCC)    Summary of HPI and Hospital Course:  Per HH&P by Dr. Linda Hedges: "Thomas Dominguez is a 75 y.o. male with medical history significant of atrial fibrillation on Eliquis, BPH, GERD, hypertension and prior small bowel obstructions who presents with abdominal pain and distention.  He reports that throughout today he developed some cramping in his abdomen with distention and increasing pain.  He has had some nausea and dry heaves.  He has a associated headache.  He had a bowel movement yesterday but has not had one today and has not been passing gas today.  He has had 5 prior small bowel obstructions he states that are from prior scar tissue.  He says his symptoms feel similar to his prior small bowel obstructions.  He states that they have always resolved with NG tube decompression and he has not had recurrent surgery for a small bowel obstruction. (   ED Course: 98.1   127/74  HR 71  RR 14. EDP exam notable for abdominal distention, tenderness, tympanitic to percussion. Lab Cmet nl except glucose 135 CBC nl. CT - early vs developing SBO withpossible transition at site of small bowel anastomosis. Patient given pain medication. NG placed and run to low suction. TRH called to admit for continued management."  General surgery consulted.  SBO protocol instituted with gastrograffin  challenge.  Contrast was present in the colon on repeat xray this morning.  Abdomen soft non-tender and non-distended.  Patient had notable improvement, more rapidly than expected.  Passing gas, tolerating advancement of diet, and had a bowel movement this afternoon.  He is clinically improved and stable for discharge home.    Discharge Instructions   Discharge Instructions     Call MD for:  extreme fatigue   Complete by: As directed    Call MD for:  persistant dizziness or light-headedness   Complete by: As directed    Call MD for:  persistant nausea and vomiting   Complete by: As directed    Call MD for:  severe uncontrolled pain   Complete by: As directed    Call MD for:  temperature >100.4   Complete by: As directed    Diet - low sodium heart healthy   Complete by: As directed    Increase activity slowly   Complete by: As directed       Allergies as of 01/04/2021       Reactions   Mobic [meloxicam] Other (See Comments)   SOB and muscle pain   Statins Other (See Comments)   Muscle pain        Medication List     STOP taking these medications    diltiazem 120 MG 24 hr capsule Commonly known as: TIAZAC       TAKE these medications    albuterol 108 (90 Base) MCG/ACT inhaler Commonly known as: VENTOLIN HFA Inhale 2 puffs into the lungs every  6 (six) hours as needed. What changed: reasons to take this   apixaban 5 MG Tabs tablet Commonly known as: Eliquis Take 1 tablet (5 mg total) by mouth 2 (two) times daily.   Centrum Silver 50+Men Tabs Take 1 tablet by mouth daily.   famotidine 20 MG tablet Commonly known as: PEPCID Take 1 tablet (20 mg total) by mouth at bedtime. What changed: See the new instructions.   methocarbamol 750 MG tablet Commonly known as: ROBAXIN TAKE 1 TABLET TWICE DAILY AS NEEDED FOR MUSCLE SPASM(S) What changed: See the new instructions.   metoprolol tartrate 25 MG tablet Commonly known as: LOPRESSOR Take 0.5 tablets (12.5 mg  total) by mouth 2 (two) times daily.   omeprazole 40 MG capsule Commonly known as: PRILOSEC Take 1 capsule (40 mg total) by mouth in the morning and at bedtime.   ondansetron 4 MG disintegrating tablet Commonly known as: Zofran ODT Take 1 tablet (4 mg total) by mouth daily as needed for nausea or vomiting.   propafenone 225 MG tablet Commonly known as: RYTHMOL TAKE 1 TABLET (225 MG TOTAL) BY MOUTH EVERY 8 HOURS. What changed:  how much to take how to take this when to take this additional instructions   tamsulosin 0.4 MG Caps capsule Commonly known as: FLOMAX Take 1 capsule (0.4 mg total) by mouth in the morning and at bedtime.   traMADol 50 MG tablet Commonly known as: ULTRAM TAKE 1 TABLET BY MOUTH EVERY 12 HOURS AS NEEDED FOR MODERATE PAIN. What changed: See the new instructions.   venlafaxine XR 150 MG 24 hr capsule Commonly known as: EFFEXOR-XR TAKE 1 CAPSULE EVERY DAY WITH BREAKFAST What changed: See the new instructions.        Follow-up Information     [facility] Follow up.   Specialty: General Surgery Why: As needed for abdominal pain, nausea, vomitting Contact information: [address] [place] [address] [phone]                Allergies  Allergen Reactions   Mobic [Meloxicam] Other (See Comments)    SOB and muscle pain   Statins Other (See Comments)    Muscle pain     If you experience worsening of your admission symptoms, develop shortness of breath, life threatening emergency, suicidal or homicidal thoughts you must seek medical attention immediately by calling 911 or calling your MD immediately  if symptoms less severe.    Please note   You were cared for by a hospitalist during your hospital stay. If you have any questions about your discharge medications or the care you received while you were in the hospital after you are discharged, you can call the unit and asked to speak with the hospitalist on  call if the hospitalist that took care of you is not available. Once you are discharged, your primary care physician will handle any further medical issues. Please note that NO REFILLS for any discharge medications will be authorized once you are discharged, as it is imperative that you return to your primary care physician (or establish a relationship with a primary care physician if you do not have one) for your aftercare needs so that they can reassess your need for medications and monitor your lab values.   Consultations: General surgery    Procedures/Studies: CT Abdomen Pelvis Wo Contrast  Result Date: 01/02/2021 CLINICAL DATA:  Bowel obstruction suspected EXAM: CT ABDOMEN AND PELVIS WITHOUT CONTRAST TECHNIQUE: Multidetector CT imaging of the  abdomen and pelvis was performed following the standard protocol without IV contrast. COMPARISON:  Radiograph 01/02/2021, upper GI 05/17/2020, CT chest 03/12/2020, CT abdomen pelvis 11/20/2019 FINDINGS: Lower chest: Redemonstration of some irregular regions of peribronchovascular consolidation and ground-glass opacity in the lungs in a very similar distribution to prior exam. Large hiatal hernia is again noted containing much of the stomach as well as portion of the transverse colon. Normal heart size. No pericardial effusion. Hepatobiliary: Stable appearance of a fluid attenuation structure in the posterior right lobe liver measuring up to 2.5 cm, probable benign cyst. No concerning focal liver lesion. Smooth liver surface contour. Normal gallbladder and biliary tree. Pancreas: Partial fatty replacement of the pancreas displaced into the large hiatal hernia as seen previously. No pancreatic ductal dilatation or surrounding inflammatory changes. Spleen: Normal in size. No concerning splenic lesions. Adrenals/Urinary Tract: Normal adrenals. Mild bilateral perinephric stranding is unchanged from comparison prior. No concerning focal renal lesion. No urolithiasis or  hydronephrosis. Mild bladder wall thickening with slight indentation of the bladder base by an enlarged prostate. Stomach/Bowel: Large hiatal hernia containing fluid, some mild wall thickening is nonspecific. Duodenum with a normal course across the midline abdomen to the ligament of Treitz. Air and fluid distention of much of the proximal small bowel to the level of some fecalized contents at a small bowel-small bowel anastomosis in the left lower quadrant. More distal small bowel/terminal ileum beyond this point is relatively decompressed. A normal appendix is visualized. Portion of the distal transverse colon contained within the large hiatal hernia sac. Scattered colonic diverticula without focal inflammation to suggest diverticulitis. Segmental thickening of the sigmoid colon without convincing acute inflammatory change at this time, can reflect sequela of prior diverticular inflammation. No extraluminal gas or organized collection. Vascular/Lymphatic: No significant vascular findings are present. No enlarged abdominal or pelvic lymph nodes. Reproductive: Prostatomegaly. Few eccentric calcifications of the prostate. Seminal vesicles are unremarkable. Other: Large hiatal hernia with postsurgical changes suggesting prior attempted surgical repair. Additional postsurgical changes from prior ventral hernia repair. Bilateral fat containing inguinal hernias. No bowel containing hernias. No abdominopelvic free air or fluid. Musculoskeletal: Prior L2-S1 PLIF with unchanged appearance of the hardware including subsidence of the S1 superior endplate. Background of diffuse multilevel degenerative changes in the spine with dextrocurvature of the thoracolumbar junction and gradual lumbar levocurvature, apex L3. Additional degenerative changes in the hips and pelvis. IMPRESSION: Appearance suggesting a possible partial or early developing small bowel obstruction with possible transition towards the site of small bowel  anastomosis in the left lower quadrant where fecalized contents may reflect a small bowel feces sign. More distal segments of small bowel are decompressed beyond this level. Large hiatal hernia containing much of the stomach, portion of the distal transverse colon, and pancreas. No resulting obstruction or complication is seen at this level acutely. Evidence of prior surgical change at the diaphragmatic hiatus. Segmental thickening of the sigmoid but without additional features of active diverticular inflammation at this time, could reflect sequela of prior inflammatory change versus a chronic or smoldering inflammatory process such as segmental colitis associated with diverticulosis. Correlate with clinical symptoms and consider outpatient evaluation with direct visualization if not recently performed. Mild bladder wall thickening with enlarged prostate. Could reflect sequela of chronic outlet obstruction though should correlate with urinalysis to exclude cystitis. Irregular regions of peribronchovascular consolidation and ground-glass in the lung bases which could reflect a systemic process such as sarcoidosis or sequela of chronic aspiration particularly given the large hiatal hernia. Overall,  appearance is not significantly changed from comparison prior HRCT imaging. Electronically Signed   By: Lovena Le M.D.   On: 01/02/2021 22:55   Abd 1 View (KUB)  Result Date: 01/03/2021 CLINICAL DATA:  Evaluate contrast location. EXAM: ABDOMEN - 1 VIEW COMPARISON:  01/02/21. FINDINGS: Enteric contrast material and tip of the NG tube is in the intrathoracic stomach, right of midline in the projection of the right lower chest. Dilated loops of small bowel are again noted within the lower abdomen. Gas is noted within the colon. IMPRESSION: 1. Enteric contrast material and tip of the NG tube is in the intrathoracic stomach. 2. Persistent dilated loops of small bowel which may reflect sequelae of partial small bowel  obstruction. Electronically Signed   By: Kerby Moors M.D.   On: 01/03/2021 12:54   DG ABD ACUTE 2+V W 1V CHEST  Result Date: 01/02/2021 CLINICAL DATA:  Small bowel obstruction suspected EXAM: DG ABDOMEN ACUTE WITH 1 VIEW CHEST COMPARISON:  May 17, 2020 FINDINGS: There is no evidence of dilated bowel loops or free intraperitoneal air. No radiopaque calculi or other significant radiographic abnormality is seen. Heart size and mediastinal contours are within normal limits. Hernia repair tacks overlie the abdomen. Prior lumbosacral fusion. Bibasilar atelectasis. IMPRESSION: Nonobstructive bowel gas.  No acute cardiopulmonary disease. Electronically Signed   By: Dahlia Bailiff MD   On: 01/02/2021 20:44   DG Abd Portable 1V  Result Date: 01/04/2021 CLINICAL DATA:  Small bowel obstruction EXAM: PORTABLE ABDOMEN - 1 VIEW COMPARISON:  Portable exam 0000 hours compared to 01/03/2021 FINDINGS: Single loop of mildly dilated small bowel in the LEFT upper quadrant. Oral contrast has progressed through small bowel into colon. No additional bowel dilatation or bowel wall thickening. Bones demineralized with postoperative changes of lumbar spine fusion. Prior ventral hernia repair. IMPRESSION: Single mildly dilated loop of small bowel in LEFT upper quadrant, little changed. Oral contrast has progressed through to the colon. Electronically Signed   By: Lavonia Dana M.D.   On: 01/04/2021 10:58   DG Abd Portable 1V  Result Date: 01/03/2021 CLINICAL DATA:  Check gastric catheter placement EXAM: PORTABLE ABDOMEN - 1 VIEW COMPARISON:  Film from earlier in the same day. FINDINGS: Gastric catheter is been placed and extends over the lower aspect of the right lung within the gastric pull-through seen on recent CT examination. This is not within the right right-sided bronchial tree. No obstructive changes are noted. IMPRESSION: Gastric catheter within gastric pull-through overlying the lower right chest. Electronically  Signed   By: Inez Catalina M.D.   On: 01/03/2021 00:06    NG Tube Insertion, Removal   Subjective: Pt feeling better. No BM yet when seen on AM rounds, but tolerated full liquid diet.  Passing gas.  No abdominal pain, N/V or other issues.    Discharge Exam: Vitals:   01/04/21 0903 01/04/21 1202  BP: 137/80 128/83  Pulse: 80 94  Resp: 19   Temp: 98.8 F (37.1 C) 99.5 F (37.5 C)  SpO2: 98% 94%   Vitals:   01/04/21 0015 01/04/21 0616 01/04/21 0903 01/04/21 1202  BP: 130/68 132/82 137/80 128/83  Pulse: 79 78 80 94  Resp: 18 18 19    Temp: 98.1 F (36.7 C) 98 F (36.7 C) 98.8 F (37.1 C) 99.5 F (37.5 C)  TempSrc: Oral Oral Oral Oral  SpO2: 95% 96% 98% 94%  Weight:      Height:        General: Pt is alert,  awake, not in acute distress Cardiovascular: RRR, no edema Respiratory: normal respiratory effort, on room air Abdominal: Soft, NT, ND, bowel sounds + Extremities: moves all, normal tone    The results of significant diagnostics from this hospitalization (including imaging, microbiology, ancillary and laboratory) are listed below for reference.     Microbiology: Recent Results (from the past 240 hour(s))  SARS CORONAVIRUS 2 (TAT 6-24 HRS) Nasopharyngeal Nasopharyngeal Swab     Status: None   Collection Time: 01/02/21 11:46 PM   Specimen: Nasopharyngeal Swab  Result Value Ref Range Status   SARS Coronavirus 2 NEGATIVE NEGATIVE Final    Comment: (NOTE) SARS-CoV-2 target nucleic acids are NOT DETECTED.  The SARS-CoV-2 RNA is generally detectable in upper and lower respiratory specimens during the acute phase of infection. Negative results do not preclude SARS-CoV-2 infection, do not rule out co-infections with other pathogens, and should not be used as the sole basis for treatment or other patient management decisions. Negative results must be combined with clinical observations, patient history, and epidemiological information. The expected result is  Negative.  Fact Sheet for Patients: SugarRoll.be  Fact Sheet for Healthcare Providers: https://www.woods-mathews.com/  This test is not yet approved or cleared by the [place] FDA and  has been authorized for detection and/or diagnosis of SARS-CoV-2 by FDA under an Emergency Use Authorization (EUA). This EUA will remain  in effect (meaning this test can be used) for the duration of the COVID-19 declaration under Se ction 564(b)(1) of the Act, 21 U.S.C. section 360bbb-3(b)(1), unless the authorization is terminated or revoked sooner.  Performed at [facility], [place] [address], [place], [place] [address]      Labs: BNP (last 3 results) Recent Labs    05/18/20 1253  BNP 83.3   Basic Metabolic Panel: Recent Labs  Lab 01/02/21 1958  NA 136  K 3.9  CL 100  CO2 26  GLUCOSE 135*  BUN 10  CREATININE 0.98  CALCIUM 9.2   Liver Function Tests: Recent Labs  Lab 01/02/21 1958  AST 23  ALT 30  ALKPHOS 90  BILITOT 0.5  PROT 6.4*  ALBUMIN 3.4*   Recent Labs  Lab 01/02/21 1958  LIPASE 28   No results for input(s): AMMONIA in the last 168 hours. CBC: Recent Labs  Lab 01/02/21 1958  WBC 9.5  HGB 16.1  HCT 48.3  MCV 96.4  PLT 208   Cardiac Enzymes: No results for input(s): CKTOTAL, CKMB, CKMBINDEX, TROPONINI in the last 168 hours. BNP: Invalid input(s): POCBNP CBG: No results for input(s): GLUCAP in the last 168 hours. D-Dimer No results for input(s): DDIMER in the last 72 hours. Hgb A1c No results for input(s): HGBA1C in the last 72 hours. Lipid Profile No results for input(s): CHOL, HDL, LDLCALC, TRIG, CHOLHDL, LDLDIRECT in the last 72 hours. Thyroid function studies No results for input(s): TSH, T4TOTAL, T3FREE, THYROIDAB in the last 72 hours.  Invalid input(s): FREET3 Anemia work up No results for input(s): VITAMINB12, FOLATE, FERRITIN, TIBC, IRON, RETICCTPCT in the last 72 hours. Urinalysis     Component Value Date/Time   COLORURINE AMBER (A) 01/03/2021 1156   APPEARANCEUR HAZY (A) 01/03/2021 1156   LABSPEC 1.026 01/03/2021 1156   PHURINE 6.0 01/03/2021 1156   GLUCOSEU NEGATIVE 01/03/2021 1156   HGBUR NEGATIVE 01/03/2021 1156   BILIRUBINUR NEGATIVE 01/03/2021 1156   KETONESUR 5 (A) 01/03/2021 1156   PROTEINUR NEGATIVE 01/03/2021 1156   NITRITE NEGATIVE 01/03/2021 [place] 01/03/2021 1156  Sepsis Labs Invalid input(s): PROCALCITONIN,  WBC,  LACTICIDVEN Microbiology Recent Results (from the past 240 hour(s))  SARS CORONAVIRUS 2 (TAT 6-24 HRS) Nasopharyngeal Nasopharyngeal Swab     Status: None   Collection Time: 01/02/21 11:46 PM   Specimen: Nasopharyngeal Swab  Result Value Ref Range Status   SARS Coronavirus 2 NEGATIVE NEGATIVE Final    Comment: (NOTE) SARS-CoV-2 target nucleic acids are NOT DETECTED.  The SARS-CoV-2 RNA is generally detectable in upper and lower respiratory specimens during the acute phase of infection. Negative results do not preclude SARS-CoV-2 infection, do not rule out co-infections with other pathogens, and should not be used as the sole basis for treatment or other patient management decisions. Negative results must be combined with clinical observations, patient history, and epidemiological information. The expected result is Negative.  Fact Sheet for Patients: SugarRoll.be  Fact Sheet for Healthcare Providers: https://www.woods-mathews.com/  This test is not yet approved or cleared by the [place] FDA and  has been authorized for detection and/or diagnosis of SARS-CoV-2 by FDA under an Emergency Use Authorization (EUA). This EUA will remain  in effect (meaning this test can be used) for the duration of the COVID-19 declaration under Se ction 564(b)(1) of the Act, 21 U.S.C. section 360bbb-3(b)(1), unless the authorization is terminated or revoked sooner.  Performed at  [facility], [place] [address], [place], [place] [address]      Time coordinating discharge: Over 30 minutes  SIGNED:   Ezekiel Slocumb, DO [facility] 01/04/2021, 2:47 PM   If 7PM-7AM, please contact night-coverage [email or web address]

## 2021-01-07 ENCOUNTER — Ambulatory Visit: Payer: Medicare HMO | Admitting: Family Medicine

## 2021-01-14 ENCOUNTER — Other Ambulatory Visit: Payer: Self-pay

## 2021-01-14 ENCOUNTER — Ambulatory Visit (HOSPITAL_COMMUNITY): Payer: Medicare HMO | Attending: Cardiovascular Disease

## 2021-01-14 DIAGNOSIS — I480 Paroxysmal atrial fibrillation: Secondary | ICD-10-CM | POA: Diagnosis not present

## 2021-01-14 DIAGNOSIS — R42 Dizziness and giddiness: Secondary | ICD-10-CM | POA: Insufficient documentation

## 2021-01-14 LAB — ECHOCARDIOGRAM COMPLETE
Area-P 1/2: 2.21 cm2
S' Lateral: 2.5 cm

## 2021-01-14 NOTE — Progress Notes (Signed)
Cardiology Office Note:    Date:  01/17/2021   ID:  Thomas Dominguez, Thomas Dominguez 11-16-1945, MRN [id number]  PCP:  Wells Guiles, DO  Cardiologist:  None  Electrophysiologist:  None   Referring MD: Benay Pike, MD   Chief complaint: AF  History of Present Illness:    Thomas Dominguez is a 75 y.o. male with a hx of atrial fibrillation, esophageal cancer status post esophagectomy, BPH, depression, degenerative disc disease who presents for follow-up.  He was referred by Dr. Owens Shark for evaluation of atrial fibrillation, initially seen on 03/18/2019.  He reports that he was diagnosed with atrial fibrillation in 2013.  He was previously seeing a cardiologist in [place], [place].  He had been alternating stress test and echo every other year.  Has been on propafenone and diltiazem since 2013.  States that he always feels it when he goes into AF.  Will occur every 3-4 days, can last up to 1 hour.  Feels short of breath when in AF.  Had back surgery in June 2020, has been improving.  States that walks 1.5 miles per day.  Has been having dyspnea with exertion but able to complete his walks.  He reports that he checks his BP at home, has always been normal.   TTE 04/02/2019 showed EF 55 to 123456, grade 2 diastolic dysfunction, normal RV function, no significant valvular disease.  14-day Zio patch showed no recurrent atrial fibrillation but did show 20 episodes of SVT, longest lasting 11 seconds.  Triggered events corresponded to sinus rhythm and rare PVCs.  Calcium score 0 on 03/12/2020.  Underwent bronchospy in October [id number] which was complicated by pneumothorax.  Biopsy was negative. 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  He was admitted 7/10 through 01/04/2021 with small bowel obstruction.  He has had 5 prior episodes of small bowel obstruction, thought to be due to from prior scar tissue and resolves with NG tube decompression.  Since last  clinic visit, he admitted 01/02/2021- 01/04/2021 with small bowel obstruction. He's had 6 episodes of small bowel obstruction in the last 9 years. He experienced lightheadedness when lifting from his chair but denies any syncope, chest pains, blood in stool, palpitations, or shortness of breath. He hasn't experienced any afib episodes since 10 years ago.   Past Medical History:  Diagnosis Date   Atrial fibrillation ([place]) 01/17/2019   BPH (benign prostatic hyperplasia) 01/17/2019   Degenerative disc disease, lumbar 01/17/2019   Depression, major, single episode, complete remission ([place]) 01/17/2019   Diverticulosis 01/17/2019   Dysrhythmia    Esophageal cancer ([facility])    GERD (gastroesophageal reflux disease)    H/O small bowel obstruction 01/17/2019   Hiatal hernia 01/17/2019   Hypertension    Pneumonia 11/2019   SBO (small bowel obstruction) ([place]) 11/20/2019   Wears glasses     Past Surgical History:  Procedure Laterality Date   ABDOMINAL HERNIA REPAIR  2012, 2014, 2017   ventral   BRONCHIAL BIOPSY  04/08/2020   Procedure: BRONCHIAL BIOPSIES;  Surgeon: Marshell Garfinkel, MD;  Location: [facility];  Service: Cardiopulmonary;;   BRONCHIAL BRUSHINGS  04/08/2020   Procedure: BRONCHIAL BRUSHINGS;  Surgeon: Marshell Garfinkel, MD;  Location: [facility];  Service: Cardiopulmonary;;   BRONCHIAL WASHINGS  04/08/2020   Procedure: BRONCHIAL WASHINGS;  Surgeon: Marshell Garfinkel, MD;  Location: [facility];  Service: Cardiopulmonary;;   COLONOSCOPY  2019   ESOPHAGECTOMY  1997   for esophageal  cancer   ESOPHAGOGASTRODUODENOSCOPY (EGD) WITH PROPOFOL N/A 06/11/2020   Procedure: ESOPHAGOGASTRODUODENOSCOPY (EGD) WITH PROPOFOL;  Surgeon: Mauri Pole, MD;  Location: [facility];  Service: Endoscopy;  Laterality: N/A;   PARATHYROIDECTOMY Right 01/26/2020   Procedure: RIGHT INFERIOR PARATHYROIDECTOMY;  Surgeon: [name], MD;  Location: [facility];  Service: General;  Laterality: Right;   SPINAL FUSION   2018, 2020   L2 L5 in 2018, L5-S1 2020   VIDEO BRONCHOSCOPY N/A 04/08/2020   Procedure: VIDEO BRONCHOSCOPY WITH FLUORO;  Surgeon: Marshell Garfinkel, MD;  Location: [facility];  Service: Cardiopulmonary;  Laterality: N/A;    Current Medications: Current Meds  Medication Sig   albuterol (VENTOLIN HFA) 108 (90 Base) MCG/ACT inhaler Inhale 2 puffs into the lungs every 6 (six) hours as needed.   apixaban (ELIQUIS) 5 MG TABS tablet Take 1 tablet (5 mg total) by mouth 2 (two) times daily.   famotidine (PEPCID) 20 MG tablet Take 1 tablet (20 mg total) by mouth at bedtime.   methocarbamol (ROBAXIN) 750 MG tablet TAKE 1 TABLET TWICE DAILY AS NEEDED FOR MUSCLE SPASM(S)   metoprolol succinate (TOPROL XL) 25 MG 24 hr tablet Take 1 tablet (25 mg total) by mouth daily.   Multiple Vitamins-Minerals (CENTRUM SILVER 50+MEN) TABS Take 1 tablet by mouth daily.   omeprazole (PRILOSEC) 40 MG capsule Take 1 capsule (40 mg total) by mouth in the morning and at bedtime.   ondansetron (ZOFRAN ODT) 4 MG disintegrating tablet Take 1 tablet (4 mg total) by mouth daily as needed for nausea or vomiting.   propafenone (RYTHMOL) 225 MG tablet TAKE 1 TABLET (225 MG TOTAL) BY MOUTH EVERY 8 HOURS.   tamsulosin (FLOMAX) 0.4 MG CAPS capsule Take 1 capsule (0.4 mg total) by mouth in the morning and at bedtime.   traMADol (ULTRAM) 50 MG tablet TAKE 1 TABLET BY MOUTH EVERY 12 HOURS AS NEEDED FOR MODERATE PAIN.   venlafaxine XR (EFFEXOR-XR) 150 MG 24 hr capsule TAKE 1 CAPSULE EVERY DAY WITH BREAKFAST   [DISCONTINUED] metoprolol tartrate (LOPRESSOR) 25 MG tablet Take 0.5 tablets (12.5 mg total) by mouth 2 (two) times daily.     Allergies:   Mobic [meloxicam] and Statins   Social History   Socioeconomic History   Marital status: Married    Spouse name: [name]   Number of children: 2   Years of education: Not on file   Highest education level: Not on file  Occupational History   Occupation: retired    Fish farm manager: [employer]   Tobacco Use   Smoking status: Never   Smokeless tobacco: Never  Vaping Use   Vaping Use: Never used  Substance and Sexual Activity   Alcohol use: Yes    Alcohol/week: 7.0 standard drinks    Types: 7 Cans of beer per week   Drug use: Never   Sexual activity: Not on file  Other Topics Concern   Not on file  Social History Narrative   Not on file   Social Determinants of Health   Financial Resource Strain: Not on file  Food Insecurity: No Food Insecurity   Worried About Running Out of Food in the Last Year: Never true   Ran Out of Food in the Last Year: Never true  Transportation Needs: No Transportation Needs   Lack of Transportation (Medical): No   Lack of Transportation (Non-Medical): No  Physical Activity: Not on file  Stress: Not on file  Social Connections: Not on file     Family History: The  patient's family history includes Emphysema in his father; GER disease in his daughter, mother, and son; Lung cancer in his sister. There is no history of Colon cancer or Stomach cancer.  ROS:   Please see the history of present illness.    (+)lightheadedness  All other systems reviewed and are negative.  EKGs/Labs/Other Studies Reviewed:    The following studies were reviewed today:   EKG:  07/22: normal sinus rhythm 1st degree AV block, rate 67, no ST abnormalities 05/18/20: NSR, rate 70, no ST/T changes   Cardiac monitor 04/22/19: No atrial fibrillation seen. 20 episodes of SVT, longest lasting 11 seconds.   13 days of data recorded on Zio monitor. Patient had a min HR of 59 bpm, max HR of 174 bpm, and avg HR of 87 bpm. Predominant underlying rhythm was Sinus Rhythm. No VT, atrial fibrillation, high degree block, or pauses noted. Isolated atrial and ventricular ectopy was rare (<1%). 20 episodes of of SVT, longest lasting 11 seconds (rate 144 bpm); fastest was rate 174 bpm, lasting 12 beats.There were 8 triggered events, which corresponded to sinus rhythm and rare PVCs.  No significant arrhythmias detected.  TTE 04/02/19:  1. Left ventricular ejection fraction, by visual estimation, is 55 to 60%. The left ventricle has normal function. There is no left ventricular hypertrophy.  2. Left ventricular diastolic Doppler parameters are consistent with pseudonormalization pattern of LV diastolic filling.  3. Global right ventricle has normal systolic function.The right ventricular size is mildly enlarged. No increase in right ventricular wall thickness.  4. Left atrial size was normal.  5. Right atrial size was normal.  6. The mitral valve is normal in structure. No evidence of mitral valve regurgitation. No evidence of mitral stenosis.  7. The tricuspid valve is normal in structure. Tricuspid valve regurgitation is trivial.  8. The aortic valve is normal in structure. Aortic valve regurgitation was not visualized by color flow Doppler.  9. The pulmonic valve was normal in structure. Pulmonic valve regurgitation is not visualized by color flow Doppler. 10. Normal pulmonary artery systolic pressure. 11. The atrial septum is grossly normal.  Recent Labs: 05/18/2020: BNP 43.1 01/02/2021: ALT 30; BUN 10; Creatinine, Ser 0.98; Hemoglobin 16.1; Platelets 208; Potassium 3.9; Sodium 136  Recent Lipid Panel    Component Value Date/Time   CHOL 186 12/26/2019 0946   TRIG 176 (H) 12/26/2019 0946   HDL 67 12/26/2019 0946   CHOLHDL 2.8 12/26/2019 0946   LDLCALC 89 12/26/2019 0946    Physical Exam:    VS:  BP 122/78   Pulse 67   Resp 18   Ht '6\' 1"'$  (1.854 m)   Wt 205 lb (93 kg)   SpO2 97%   BMI 27.05 kg/m     Wt Readings from Last 3 Encounters:  01/17/21 205 lb (93 kg)  01/02/21 212 lb (96.2 kg)  09/20/20 212 lb 12.8 oz (96.5 kg)     GEN:  Well nourished, well developed in no acute distress HEENT: Normal NECK: No JVD; No carotid bruits CARDIAC: RRR, no murmurs, rubs, gallops RESPIRATORY:  Clear to auscultation without rales, wheezing or rhonchi  ABDOMEN:  Soft, non-tender, non-distended MUSCULOSKELETAL: no LE edema; No deformity  SKIN: Warm and dry NEUROLOGIC:  Alert and oriented x 3 PSYCHIATRIC:  Normal affect   ASSESSMENT:    1. PAF (paroxysmal atrial fibrillation) ([place])   2. SVT (supraventricular tachycardia) ([place])   3. DOE (dyspnea on exertion)   4. Hyperlipidemia, unspecified hyperlipidemia type  PLAN:     Atrial fibrillation: paroxysmal, on propafenone 225 mg 3 times daily and diltiazem 120 mg daily.  CHADS-VASc 1 given age (will be 2 in 01/2021 as turning 39).  Cardiac monitor 04/22/2019 shows no evidence of recurrent A. fib TTE on 04/02/2019 showed normal biventricular function, no significant valvular disease - Continue Eliquis 5 mg twice daily - Continue propafenone  -Had recent soft BP and switched diltiazem to metoprolol.  BP has normalized, now 122/78 in clinic today.  Will consolidate metoprolol to Toprol-XL 25 mg daily  SVT: Having intermittent episodes on monitor, longest lasting up to 11 seconds.  Reports palpitations have resolved.  Continue diltiazem and propafenone as above  Dyspnea: Following with pulmonology, CT chest 11/30/2019 with findings consistent with sequela of prior inflammation.  PFTs on 12/12/2019 with findings suggestive of restriction but normal lung volumes.  Diastolic dysfunction could be contributing, though BNP checked at last clinic visit and was unremarkable (21).  Underwent bronchoscopy with biopsy, which was unremarkable.  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  He reports dyspnea has improved  Hyperlipidemia: LDL 101 on 06/10/2019.  Started on atorvastatin but unable to tolerate due to myalgias.  Calcium score 0 on 03/12/2020.  Can hold off on statin.  RTC in 6 months  Medication Adjustments/Labs and Tests Ordered: Current medicines are reviewed at length with the patient today.  Concerns regarding medicines are  outlined above.  Orders Placed This Encounter  Procedures   EKG 12-Lead    Meds ordered this encounter  Medications   metoprolol succinate (TOPROL XL) 25 MG 24 hr tablet    Sig: Take 1 tablet (25 mg total) by mouth daily.    Dispense:  90 tablet    Refill:  3    STOP Lopressor     Patient Instructions  Medication Instructions:  STOP metoprolol tartrate (Lopressor) START metoprolol succinate (Toprol XL) 25 mg once daily  *If you need a refill on your cardiac medications before your next appointment, please call your pharmacy*  Follow-Up: At [facility], you and your health needs are our priority.  As part of our continuing mission to provide you with exceptional heart care, we have created designated Provider Care Teams.  These Care Teams include your primary Cardiologist (physician) and Advanced Practice Providers (APPs -  Physician Assistants and Nurse Practitioners) who all work together to provide you with the care you need, when you need it.  We recommend signing up for the patient portal called "MyChart".  Sign up information is provided on this After Visit Summary.  MyChart is used to connect with patients for Virtual Visits (Telemedicine).  Patients are able to view lab/test results, encounter notes, upcoming appointments, etc.  Non-urgent messages can be sent to your provider as well.   To learn more about what you can do with MyChart, go to [email or web address].    Your next appointment:   6 month(s)  The format for your next appointment:   In Person  Provider:   Oswaldo Milian, MD      Ardell Isaacs as a scribe for Donato Heinz, MD.,have documented all relevant documentation on the behalf of Donato Heinz, MD,as directed by  Donato Heinz, MD while in the presence of Donato Heinz, MD.  I, Donato Heinz, MD, have reviewed all documentation for this visit. The documentation on 01/23/21 for the exam,  diagnosis, procedures, and orders are all accurate and  complete.   Signed, Donato Heinz, MD  01/17/2021 9:38 AM    [place]

## 2021-01-17 ENCOUNTER — Other Ambulatory Visit: Payer: Self-pay

## 2021-01-17 ENCOUNTER — Encounter: Payer: Self-pay | Admitting: Cardiology

## 2021-01-17 ENCOUNTER — Ambulatory Visit: Payer: Medicare HMO | Admitting: Cardiology

## 2021-01-17 VITALS — BP 122/78 | HR 67 | Resp 18 | Ht 73.0 in | Wt 205.0 lb

## 2021-01-17 DIAGNOSIS — R0609 Other forms of dyspnea: Secondary | ICD-10-CM

## 2021-01-17 DIAGNOSIS — E785 Hyperlipidemia, unspecified: Secondary | ICD-10-CM | POA: Diagnosis not present

## 2021-01-17 DIAGNOSIS — I471 Supraventricular tachycardia: Secondary | ICD-10-CM

## 2021-01-17 DIAGNOSIS — I480 Paroxysmal atrial fibrillation: Secondary | ICD-10-CM | POA: Diagnosis not present

## 2021-01-17 DIAGNOSIS — R0600 Dyspnea, unspecified: Secondary | ICD-10-CM

## 2021-01-17 MED ORDER — METOPROLOL SUCCINATE ER 25 MG PO TB24: 25.0000 mg | ORAL_TABLET | Freq: Every day | ORAL | 3 refills | Status: DC

## 2021-01-17 NOTE — Patient Instructions (Signed)
Medication Instructions:  STOP metoprolol tartrate (Lopressor) START metoprolol succinate (Toprol XL) 25 mg once daily  *If you need a refill on your cardiac medications before your next appointment, please call your pharmacy*  Follow-Up: At [facility], you and your health needs are our priority.  As part of our continuing mission to provide you with exceptional heart care, we have created designated Provider Care Teams.  These Care Teams include your primary Cardiologist (physician) and Advanced Practice Providers (APPs -  Physician Assistants and Nurse Practitioners) who all work together to provide you with the care you need, when you need it.  We recommend signing up for the patient portal called "MyChart".  Sign up information is provided on this After Visit Summary.  MyChart is used to connect with patients for Virtual Visits (Telemedicine).  Patients are able to view lab/test results, encounter notes, upcoming appointments, etc.  Non-urgent messages can be sent to your provider as well.   To learn more about what you can do with MyChart, go to [email or web address].    Your next appointment:   6 month(s)  The format for your next appointment:   In Person  Provider:   Oswaldo Milian, MD

## 2021-01-22 ENCOUNTER — Other Ambulatory Visit: Payer: Self-pay | Admitting: Cardiology

## 2021-01-25 NOTE — Telephone Encounter (Signed)
Please schedule CT abd & pelvis with contrast to exclude persistent partial sbo. Will need BMP. Thanks

## 2021-01-26 ENCOUNTER — Other Ambulatory Visit: Payer: Self-pay

## 2021-01-26 ENCOUNTER — Other Ambulatory Visit (INDEPENDENT_AMBULATORY_CARE_PROVIDER_SITE_OTHER): Payer: Medicare HMO

## 2021-01-26 DIAGNOSIS — R1084 Generalized abdominal pain: Secondary | ICD-10-CM

## 2021-01-26 DIAGNOSIS — K56699 Other intestinal obstruction unspecified as to partial versus complete obstruction: Secondary | ICD-10-CM

## 2021-01-26 DIAGNOSIS — K56609 Unspecified intestinal obstruction, unspecified as to partial versus complete obstruction: Secondary | ICD-10-CM

## 2021-01-26 LAB — BASIC METABOLIC PANEL
BUN: 11 mg/dL (ref 6–23)
CO2: 30 mEq/L (ref 19–32)
Calcium: 9.3 mg/dL (ref 8.4–10.5)
Chloride: 102 mEq/L (ref 96–112)
Creatinine, Ser: 0.9 mg/dL (ref 0.40–1.50)
GFR: 83.88 mL/min (ref >60.00)
Glucose, Bld: 60 mg/dL — ABNORMAL LOW (ref 70–99)
Potassium: 4.6 mEq/L (ref 3.5–5.1)
Sodium: 138 mEq/L (ref 135–145)

## 2021-01-31 ENCOUNTER — Other Ambulatory Visit: Payer: Self-pay

## 2021-01-31 ENCOUNTER — Ambulatory Visit
Admission: RE | Admit: 2021-01-31 | Discharge: 2021-01-31 | Disposition: A | Discharge status: Home / Self Care | Payer: Medicare HMO | Source: Ambulatory Visit | Attending: Gastroenterology | Admitting: Gastroenterology

## 2021-01-31 DIAGNOSIS — R1084 Generalized abdominal pain: Secondary | ICD-10-CM

## 2021-01-31 DIAGNOSIS — K5730 Diverticulosis of large intestine without perforation or abscess without bleeding: Secondary | ICD-10-CM | POA: Diagnosis not present

## 2021-01-31 DIAGNOSIS — K56699 Other intestinal obstruction unspecified as to partial versus complete obstruction: Secondary | ICD-10-CM

## 2021-01-31 DIAGNOSIS — K449 Diaphragmatic hernia without obstruction or gangrene: Secondary | ICD-10-CM | POA: Diagnosis not present

## 2021-01-31 DIAGNOSIS — K56609 Unspecified intestinal obstruction, unspecified as to partial versus complete obstruction: Secondary | ICD-10-CM

## 2021-01-31 DIAGNOSIS — K6389 Other specified diseases of intestine: Secondary | ICD-10-CM | POA: Diagnosis not present

## 2021-01-31 DIAGNOSIS — N3289 Other specified disorders of bladder: Secondary | ICD-10-CM | POA: Diagnosis not present

## 2021-01-31 MED ORDER — IOPAMIDOL (ISOVUE-300) INJECTION 61%
100.0000 mL | Freq: Once | INTRAVENOUS | Status: AC | PRN
  Administered 2021-01-31: 100 mL via INTRAVENOUS

## 2021-02-01 NOTE — Telephone Encounter (Signed)
Dr. Shearon Stalls, Patient has sent you a message below.   In reference to the above and also subsequent appt. with Dr. Shearon Stalls, I just had an abdominal CT done for Dr Silverio Decamp Aug. 8th. Also had a CT done in [facility] hospital July during hospitalization for a BO. I don't know if Dr Shearon Stalls can review that one for sufficient information so I could save a copay or if a new chest\lung CT would still need to be done. I did see lung information mentioned regarding atelectasis which I think is something new? So, just asking and letting you know. Thanks, [name] [facility]

## 2021-02-12 ENCOUNTER — Other Ambulatory Visit: Payer: Self-pay | Admitting: Cardiology

## 2021-02-16 ENCOUNTER — Encounter: Payer: Self-pay | Admitting: Student

## 2021-02-23 ENCOUNTER — Telehealth: Payer: Self-pay

## 2021-02-23 DIAGNOSIS — K4090 Unilateral inguinal hernia, without obstruction or gangrene, not specified as recurrent: Secondary | ICD-10-CM | POA: Diagnosis not present

## 2021-02-23 NOTE — Telephone Encounter (Signed)
   [facility] Pre-operative Risk Assessment    Patient Name: Thomas Dominguez  DOB: 28-Nov-1945 MRN: [id number]  HEARTCARE STAFF:  - IMPORTANT!!!!!! Under Visit Info/Reason for Call, type in Other and utilize the format Clearance MM/DD/YY or Clearance TBD. Do not use dashes or single digits. - Please review there is not already an duplicate clearance open for this procedure. - If request is for dental extraction, please clarify the # of teeth to be extracted. - If the patient is currently at the dentist's office, call Pre-Op Callback Staff (MA/nurse) to input urgent request.  - If the patient is not currently in the dentist office, please route to the Pre-Op pool.  Request for surgical clearance:  What type of surgery is being performed? Hernia Surgery   When is this surgery scheduled? TBD   What type of clearance is required (medical clearance vs. Pharmacy clearance to hold med vs. Both)? Cardiac   Are there any medications that need to be held prior to surgery and how long? Eliquis  Practice name and name of physician performing surgery? [facility] [name], MD  What is the office phone number? [phone]   7.   What is the office fax number? [phone] Attn: Mammie Lorenzo, LPN  8.   Anesthesia type (None, local, MAC, general) ? General    Thomas Dominguez 02/23/2021, 4:06 PM  _________________________________________________________________   (provider comments below)

## 2021-02-24 MED ORDER — APIXABAN 5 MG PO TABS: 5.0000 mg | ORAL_TABLET | Freq: Two times a day (BID) | ORAL | 1 refills | Status: DC

## 2021-02-24 NOTE — Telephone Encounter (Signed)
Patient with diagnosis of afib on Eliquis for anticoagulation.    Procedure: hernia surgery Date of procedure: TBD  CHA2DS2-VASc Score = 3  This indicates a 3.2% annual risk of stroke. The patient's score is based upon: CHF History: No HTN History: Yes Diabetes History: No Stroke History: No Vascular Disease History: No Age Score: 2 Gender Score: 0  Of note, HTN listed on PMH but BP notably well controlled only on low dose Toprol for rate control for his afib, CHADS2VASC 2-3 depending on whether this is counted as a risk factor.  CrCl 43m/min Platelet count 208K  Per office protocol, patient can hold Eliquis for 2-3 days prior to procedure.

## 2021-02-24 NOTE — Telephone Encounter (Signed)
Pharmacy, can you please comment on how long Eliquis can be held for hernia surgery?  Thank you!

## 2021-02-25 NOTE — Telephone Encounter (Signed)
   Primary Cardiologist: Thomas Heinz, MD  Chart reviewed as part of pre-operative protocol coverage. Given past medical history and time since last visit, based on ACC/AHA guidelines, Thomas Dominguez would be at acceptable risk for the planned procedure without further cardiovascular testing.   Patient with diagnosis of afib on Eliquis for anticoagulation.     Procedure: hernia surgery Date of procedure: TBD   CHA2DS2-VASc Score = 3  This indicates a 3.2% annual risk of stroke. The patient's score is based upon: CHF History: No HTN History: Yes Diabetes History: No Stroke History: No Vascular Disease History: No Age Score: 2 Gender Score: 0   Of note, HTN listed on PMH but BP notably well controlled only on low dose Toprol for rate control for his afib, CHADS2VASC 2-3 depending on whether this is counted as a risk factor.   CrCl 4m/min Platelet count 208K   Per office protocol, patient can hold Eliquis for 2-3 days prior to procedure.   I will route this recommendation to the requesting party via Epic fax function and remove from pre-op pool.  Please call with questions.  JJossie Ng Yvon Mccord NP-C    02/25/2021, 8:36 AM [place] Office [phone] [phone]

## 2021-03-07 ENCOUNTER — Other Ambulatory Visit: Payer: Self-pay

## 2021-03-07 ENCOUNTER — Ambulatory Visit (INDEPENDENT_AMBULATORY_CARE_PROVIDER_SITE_OTHER)
Admission: RE | Admit: 2021-03-07 | Discharge: 2021-03-07 | Disposition: A | Discharge status: Home / Self Care | Payer: Medicare HMO | Source: Ambulatory Visit | Attending: Internal Medicine | Admitting: Internal Medicine

## 2021-03-07 DIAGNOSIS — J189 Pneumonia, unspecified organism: Secondary | ICD-10-CM | POA: Diagnosis not present

## 2021-03-07 DIAGNOSIS — I700 Atherosclerosis of aorta: Secondary | ICD-10-CM | POA: Diagnosis not present

## 2021-03-07 DIAGNOSIS — J690 Pneumonitis due to inhalation of food and vomit: Secondary | ICD-10-CM

## 2021-03-08 ENCOUNTER — Encounter: Payer: Self-pay | Admitting: Gastroenterology

## 2021-03-08 ENCOUNTER — Ambulatory Visit: Payer: Medicare HMO | Admitting: Gastroenterology

## 2021-03-08 VITALS — BP 80/60 | HR 96 | Ht 71.0 in | Wt 207.1 lb

## 2021-03-08 DIAGNOSIS — R112 Nausea with vomiting, unspecified: Secondary | ICD-10-CM | POA: Diagnosis not present

## 2021-03-08 DIAGNOSIS — K449 Diaphragmatic hernia without obstruction or gangrene: Secondary | ICD-10-CM

## 2021-03-08 DIAGNOSIS — K219 Gastro-esophageal reflux disease without esophagitis: Secondary | ICD-10-CM | POA: Diagnosis not present

## 2021-03-08 DIAGNOSIS — R109 Unspecified abdominal pain: Secondary | ICD-10-CM | POA: Diagnosis not present

## 2021-03-08 DIAGNOSIS — R9389 Abnormal findings on diagnostic imaging of other specified body structures: Secondary | ICD-10-CM

## 2021-03-08 DIAGNOSIS — Z8719 Personal history of other diseases of the digestive system: Secondary | ICD-10-CM | POA: Diagnosis not present

## 2021-03-08 DIAGNOSIS — Z8501 Personal history of malignant neoplasm of esophagus: Secondary | ICD-10-CM | POA: Diagnosis not present

## 2021-03-08 MED ORDER — HYOSCYAMINE SULFATE SL 0.125 MG SL SUBL: 1.0000 | SUBLINGUAL_TABLET | Freq: Two times a day (BID) | SUBLINGUAL | 2 refills | Status: DC | PRN

## 2021-03-08 MED ORDER — ONDANSETRON HCL 4 MG PO TABS: 4.0000 mg | ORAL_TABLET | Freq: Three times a day (TID) | ORAL | 3 refills | Status: DC | PRN

## 2021-03-08 NOTE — Patient Instructions (Signed)
We have sent Levsin and Zofran to your pharmacy  Follow up in 3 months  If you are age 75 or older, your body mass index should be between 23-30. Your Body mass index is 28.89 kg/m. If this is out of the aforementioned range listed, please consider follow up with your Primary Care Provider.  If you are age 43 or younger, your body mass index should be between 19-25. Your Body mass index is 28.89 kg/m. If this is out of the aformentioned range listed, please consider follow up with your Primary Care Provider.   __________________________________________________________  The [facility] providers would like to encourage you to use [facility] to communicate with providers for non-urgent requests or questions.  Due to long hold times on the telephone, sending your provider a message by [facility] may be a faster and more efficient way to get a response.  Please allow 48 business hours for a response.  Please remember that this is for non-urgent requests.    Thank you for choosing [facility]  Karleen Hampshire Nand[place]am,MD

## 2021-03-08 NOTE — Telephone Encounter (Signed)
ND please advise. Thanks   My appointment with you and PFT test aren't until Oct. 12th. Should I be taking any Rx in reference to these results? My BP today was 80\60. (Taken twice) Would my lungs have affected my BP.? Thank you.

## 2021-03-08 NOTE — Progress Notes (Signed)
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  He is not a candidate for surgical revision He is taking omeprazole twice daily, continues to have breakthrough reflux symptoms on average once or twice a week.  Denies any dysphagia or odynophagia.   He was hospitalized with small bowel obstruction in July 2022, spontaneously resolved with bowel rest and NG tube suction.  This was fifth or sixth hospitalization in the past few years with SBO. His bowel habits are regular, he is taking Benefiber/operative fiber daily.  Denies any constipation, diarrhea, melena or blood per rectum. Continues to have intermittent abdominal cramping  CT abdomen pelvis with contrast January 31, 2021 1. Interval resolution of small bowel dilatation. No current evidence of bowel obstruction. 2. Colonic diverticulosis with unchanged sigmoid colon wall thickening, possibly chronic and related to previous inflammation. No definite evidence of acute diverticulitis. 3. Unchanged large hiatal hernia containing a short segment of the transverse colon and portions of the pancreas and retroperitoneal vasculature. 4. Diffuse bladder wall thickening, increased from the prior CT. Correlate for cystitis. 5. Unchanged peribronchovascular consolidation in the lung bases with differential considerations as previously detailed.    He is currently undergoing pulmonary work-up for abnormal CT chest, is scheduled to undergo bronchoscopy later this month  CT chest March 07, 2021 Peribronchovascular  consolidations and ground-glass nodular opacities are unchanged in appearance compared to prior exam and likely sequela of aspiration. Areas of low attenuation are seen within the consolidations, suggesting a component of lipoid pneumonia. Status post esophagectomy with gastric pull-through.   CT cardiac coronary March 12, 2020: Cardiac coronary calcium score 0   CT chest high-resolution March 12, 2020: Widespread areas of thickening of peribronchovascular interstitium with architectural distortion including nodular and masslike opacities, surrounding groundglass attenuation and micronodularity stable compared to March 2021.    CT abdomen pelvis Nov 20, 2019: Mid small bowel obstruction likely due to adhesions.  Airspace disease in bilateral lungs worrisome for aspiration   04/02/2019: LVEF 55-60%   Colonoscopy 10/03/2017 Dr. Shirlyn Goltz (f/u diverticulitis)- severe pancolonic diverticulosis, tortuosity & spasm  EGD 03/2015 Dr. Shirlyn Goltz- changes of prior esophagectomy and gastric pull-through, normal mucosa throughout  EGD 03/2011 Dr. Shirlyn Goltz- surgically shortened anastomosis at 21 cm, no Barrett's changes seen. Fair amount of solid food retention. Gastric food retention obscuring pylorus. Very angulated atrium without stenosis, probably contributing to emptying difficulties. Because of coughing difficulties did not pursue duodenal intubation  EGD 10/2010 Dr. Shirlyn Goltz- gastric food retention obscuring pylorus  CLN 09/12/2012 Dr. Shirlyn Goltz- pancolonic diverticulosis, less than ideal prep, internal hemorrhoids  CLN 2004- severe left-sided diverticulosis  RECENT GI IMAGING STUDIES:  AXR 05/2018- no free air identified, no dilated bowel loops  CTAP 05/2018 SMALL BOWEL OBSTRUCTION. TRANSITION POINT IS NEAR A SMALL BOWEL ANASTOMOSIS IN THE DISTAL SMALL BOWEL CENTRAL ABDOMEN IN THE REGION OF THE CENTRAL MESENTERY. EXACT TRANSITION POINT IS DIFFICULT TO IDENTIFY. EFFUSIONS CANNOT BE EXCLUDED.  NO DEFINITIVE FOCAL MASS. NO ADDITIONAL SIGNIFICANT CHANGE.  PET scan 07/2017 NO EVIDENCE OF HYPERMETABOLIC MALIGNANCY IN THE CHEST, ABDOMEN OR PELVIS. HYPERMETABOLIC ACTIVITY NEAR  THE SMALL BOWEL ANASTOMOSIS HAS THE APPEARANCE OF PHYSIOLOGIC INTRALUMINAL BOWEL ACTIVITY WITH NO PERCEIVED MASS SEEN ON TODAY'S EXAM  PREVIOUS ABDOMINAL SURGERIES: Ventral hernia repair, paraesophageal hernia repair (2013), esophagectomy (1997)  CANCER HISTORY: Esophageal cancer s/p esophagectomy 1997 at [facility] No family history of colon cancer.   Barium swallow 08/28/96 Post op anastomotic stricture s/p esophagogastrectomy. 52m barium tablet did not traverse.   Outpatient Encounter Medications as of 03/08/2021  Medication Sig   albuterol (VENTOLIN HFA) 108 (90 Base) MCG/ACT inhaler Inhale 2 puffs into the lungs every 6 (six) hours as needed.   apixaban (ELIQUIS) 5 MG TABS tablet Take 5 mg by mouth 2 (two) times daily.   famotidine (PEPCID) 20 MG tablet Take 1 tablet (20 mg total) by mouth at bedtime.   methocarbamol (ROBAXIN) 750 MG tablet TAKE 1 TABLET TWICE DAILY AS NEEDED FOR MUSCLE SPASM(S)   metoprolol succinate (TOPROL XL) 25 MG 24 hr tablet Take 1 tablet (25 mg total) by mouth daily.   Multiple Vitamins-Minerals (CENTRUM SILVER 50+MEN) TABS Take 1 tablet by mouth daily.   omeprazole (PRILOSEC) 40 MG capsule Take 1 capsule (40 mg total) by mouth in the morning and at bedtime.   ondansetron (ZOFRAN ODT) 4 MG disintegrating tablet Take 1 tablet (4 mg total) by mouth daily as needed for nausea or vomiting.   propafenone (RYTHMOL) 225 MG tablet TAKE 1 TABLET EVERY 8 HOURS   tamsulosin (FLOMAX) 0.4 MG CAPS capsule Take 1 capsule (0.4 mg total) by mouth in the morning and at bedtime.   traMADol (ULTRAM) 50 MG tablet TAKE 1 TABLET BY MOUTH EVERY 12 HOURS AS NEEDED FOR MODERATE PAIN.   venlafaxine XR (EFFEXOR-XR) 150 MG 24 hr capsule TAKE 1 CAPSULE EVERY DAY WITH BREAKFAST   No facility-administered encounter  medications on file as of 03/08/2021.    Allergies as of 03/08/2021 - Review Complete 03/08/2021  Allergen Reaction Noted   Mobic [meloxicam] Other (See Comments) 01/17/2019   Statins Other (See Comments) 01/17/2019    Past Medical History:  Diagnosis Date   Atrial fibrillation ([facility] 01/17/2019   BPH (benign prostatic hyperplasia) 01/17/2019   Degenerative disc disease, lumbar 01/17/2019   Depression, major, single episode, complete remission ([facility] [place] 01/17/2019   Diverticulosis 01/17/2019   Dysrhythmia    Esophageal cancer (HCC)    GERD (gastroesophageal reflux disease)    H/O small bowel obstruction 01/17/2019   Hiatal hernia 01/17/2019   Hypertension    Inguinal hernia    bilateral, left more prominant   Pneumonia 11/2019   SBO ([place] bowel obstruction) ([facility] 11/20/2019   Wears glasses     Past Surgical History:  Procedure Laterality Date   ABDOMINAL HERNIA REPAIR  2012, 2014, 2017   ventral   BRONCHIAL BIOPSY  04/08/2020   Procedure: BRONCHIAL BIOPSIES;  Surgeon: MMarshell Garfinkel MD;  Location: [facility];  Service: Cardiopulmonary;;   BRONCHIAL BRUSHINGS  04/08/2020   Procedure: BRONCHIAL BRUSHINGS;  Surgeon: MMarshell Garfinkel MD;  Location: [facility];  Service: Cardiopulmonary;;   BRONCHIAL WASHINGS  04/08/2020   Procedure: BRONCHIAL WASHINGS;  Surgeon: MMarshell Garfinkel MD;  Location: [facility];  Service: Cardiopulmonary;;   COLONOSCOPY  2019   ESOPHAGECTOMY  1997   for esophageal cancer   ESOPHAGOGASTRODUODENOSCOPY (EGD) WITH PROPOFOL N/A 06/11/2020   Procedure: ESOPHAGOGASTRODUODENOSCOPY (EGD) WITH PROPOFOL;  Surgeon: NMauri Pole MD;  Location: [facility];  Service: Endoscopy;  Laterality: N/A;   PARATHYROIDECTOMY Right 01/26/2020   Procedure: RIGHT INFERIOR PARATHYROIDECTOMY;  Surgeon: GArmandina Gemma MD;  Location: [facility];  Service: General;  Laterality: Right;   SPINAL FUSION  2018, 2020   L2 L5 in 2018, L5-S1 2020   VIDEO BRONCHOSCOPY N/A  04/08/2020   Procedure: VIDEO BRONCHOSCOPY WITH FLUORO;  Surgeon: Marshell Garfinkel, MD;  Location: [facility];  Service: Cardiopulmonary;  Laterality: N/A;    Family History  Problem Relation Age of Onset   Emphysema Father        smoker   GER disease Daughter    GER disease Son    GER disease Mother    Lung cancer Sister        never smoked   Colon cancer Neg Hx    Stomach cancer Neg Hx     Social History   Socioeconomic History   Marital status: Married    Spouse name: [name]   Number of children: 2   Years of education: Not on file   Highest education level: Not on file  Occupational History   Occupation: retired    Fish farm manager: [employer]  Tobacco Use   Smoking status: Never   Smokeless tobacco: Never  Vaping Use   Vaping Use: Never used  Substance and Sexual Activity   Alcohol use: Yes    Alcohol/week: 7.0 standard drinks    Types: 7 Cans of beer per week   Drug use: Never   Sexual activity: Not on file  Other Topics Concern   Not on file  Social History Narrative   Not on file   Social Determinants of Health   Financial Resource Strain: Not on file  Food Insecurity: No Food Insecurity   Worried About Running Out of Food in the Last Year: Never true   Ran Out of Food in the Last Year: Never true  Transportation Needs: No Transportation Needs   Lack of Transportation (Medical): No   Lack of Transportation (Non-Medical): No  Physical Activity: Not on file  Stress: Not on file  Social Connections: Not on file  Intimate Partner Violence: Not on file      Review of systems: All other review of systems negative except as mentioned in the HPI.   Physical Exam: Vitals:   03/08/21 1032  BP: (!) 80/60  Pulse: 96   Body mass index is 28.89 kg/m. Gen:      No acute distress HEENT:  sclera anicteric Abd:      soft, non-tender; no palpable masses, no distension Ext:    No edema Neuro: alert and oriented x 3 Psych: normal mood and affect  Data  Reviewed:  Reviewed labs, radiology imaging, old records and pertinent past GI work up   Assessment and Plan/Recommendations:  75 year old very pleasant gentleman with history of chronic GERD, Barrett's esophagus, esophageal carcinoma s/p resection with gastric pull-through, hiatal and ventral hernia repair with recurrent hernia involving colon and pancreas through the postoperative hiatus, recurrent small bowel obstructions requiring hospitalizations secondary to adhesions   GERD: Persistent breakthrough reflux symptoms despite twice daily omeprazole Continue PPI daily Add Pepcid 20 mg daily at bedtime as needed Gaviscon after meals as needed for postprandial breakthrough reflux symptoms  Nausea secondary to delayed gastric emptying due to altered anatomy postsurgically and uncontrolled GERD Use Zofran 4 mg daily as needed for severe nausea   Bilateral lung groundglass opacities possibly secondary to chronic aspiration, is currently undergoing work-up by pulmonary    Discussed antireflux measures in detail with patient   History of esophageal cancer and Barrett's esophagus: He is up-to-date with surveillance History of  diverticulitis April 2020, he has pancolonic diverticulosis.  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 The patient agreed with the plan and demonstrated an understanding of the instructions.  Damaris Hippo , MD    CC: Benay Pike, MD

## 2021-03-09 IMAGING — DX DG CHEST 2V
2 series · 2 of 2 positions shown · non-contrast
Comparison: 11/28/2019, 11/20/2019

CLINICAL DATA: Cough, congestion. History of gastroesophageal
pull-through

EXAM:
CHEST - 2 VIEW

[chest pa]
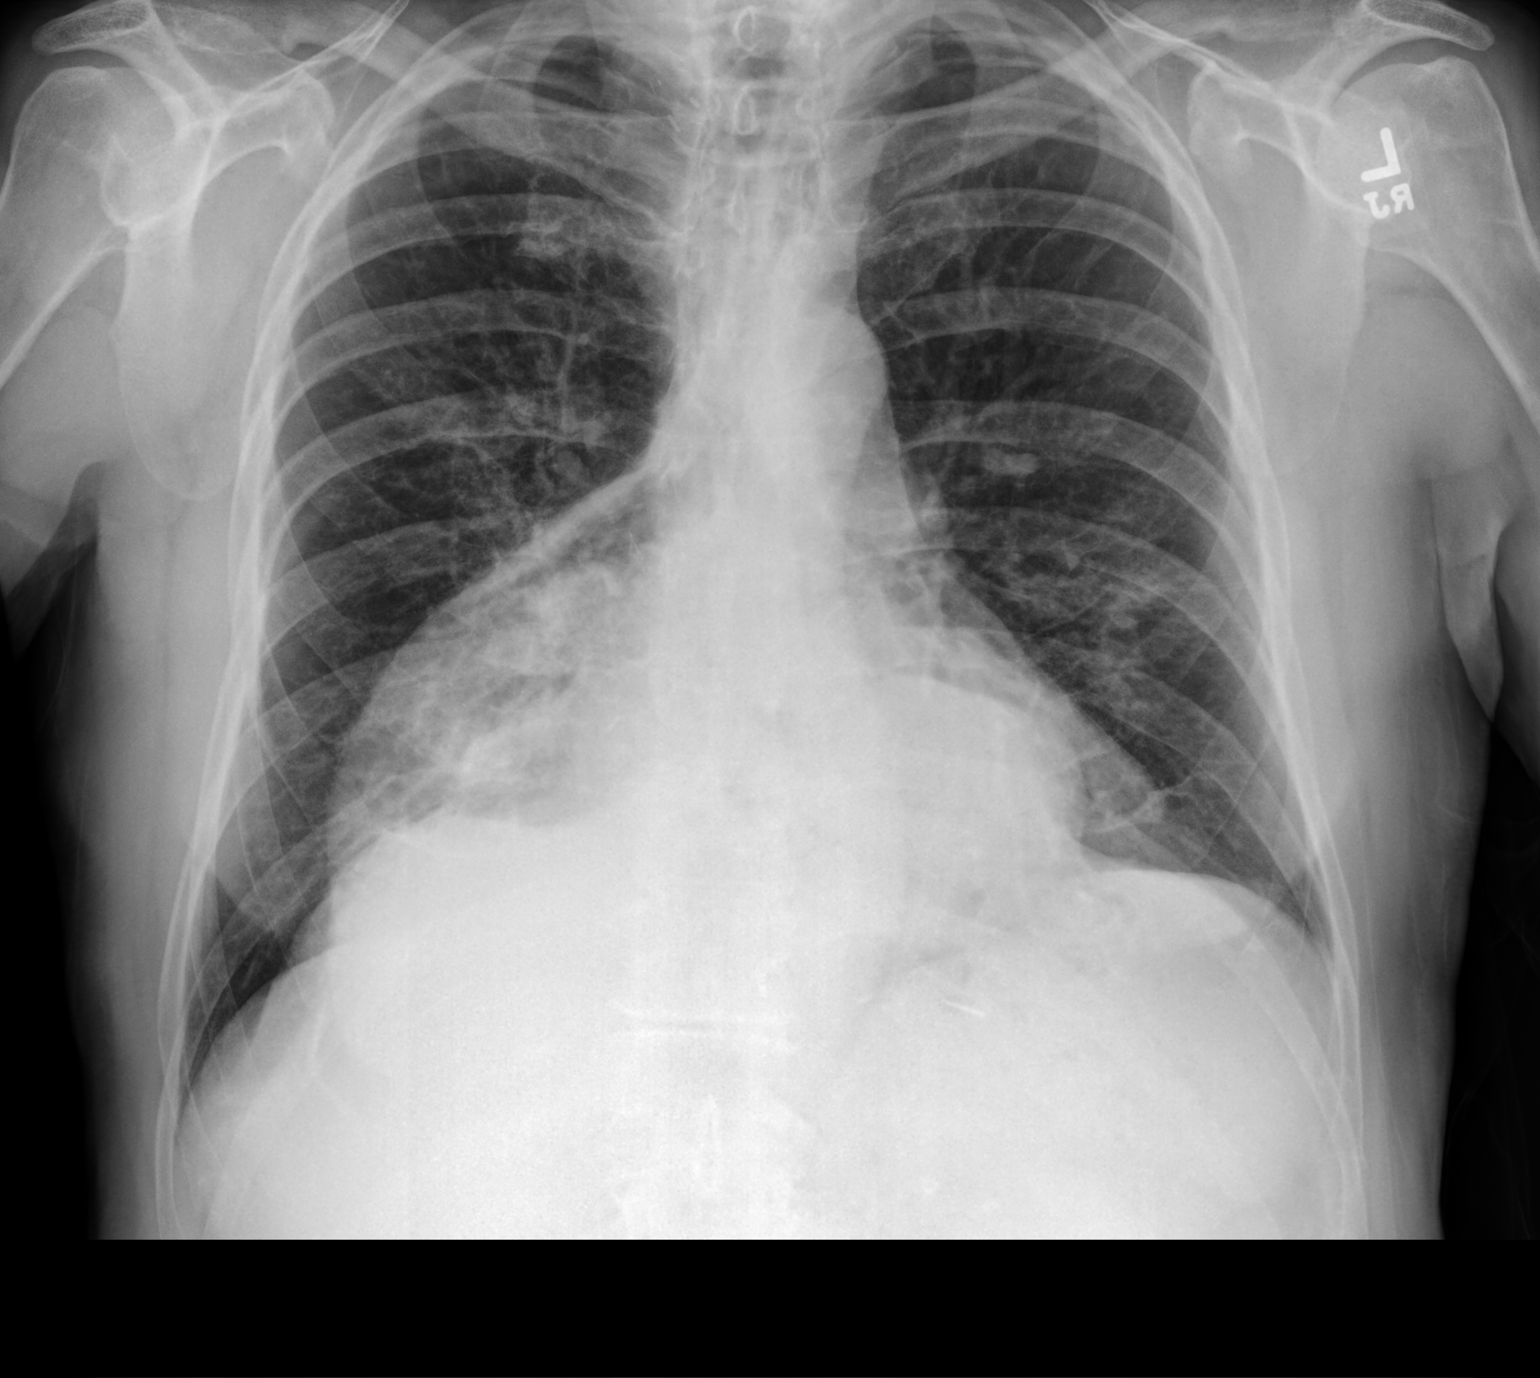

[chest lat]
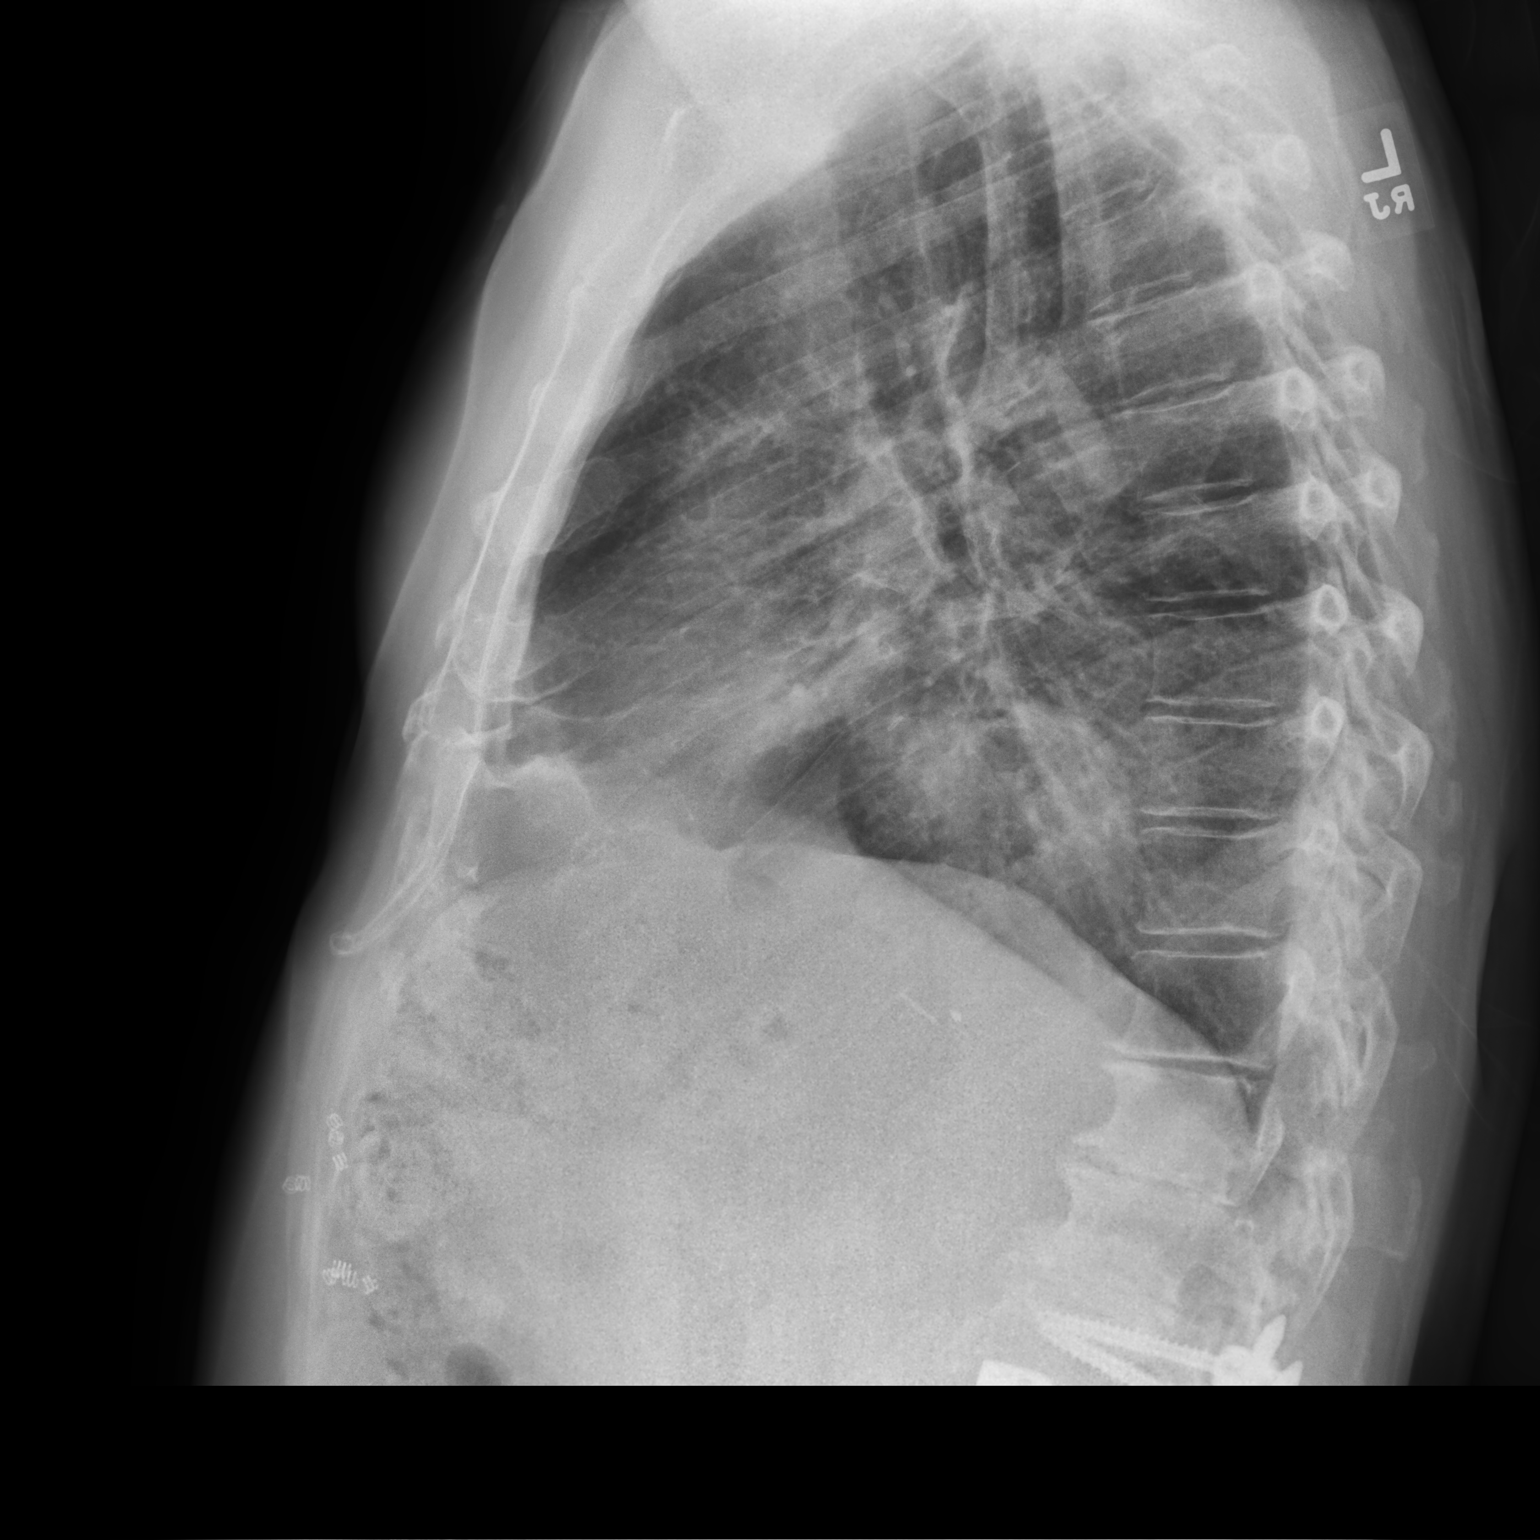

[2 of 2 positions shown; findings below may reference images not displayed]

FINDINGS: Unchanged mediastinal contours with gastric pull-through and hiatal
hernia. Streaky opacities within the left mid lung including an
ovoid nodular density in the left perihilar region measuring 1.4 cm.
Findings appears slightly more prominent compared to the prior
study. No pleural effusion or pneumothorax.
IMPRESSION: 1. Streaky opacities within the left mid lung appear more prominent
compared to the prior study and may reflect atypical or viral
infection.
2. Ovoid 1.4 cm nodular density in the left perihilar region, also
appears more prominent compared to the prior study. CT of the chest
can be performed for further evaluation.

## 2021-03-11 ENCOUNTER — Encounter: Payer: Self-pay | Admitting: Student

## 2021-03-15 ENCOUNTER — Encounter (HOSPITAL_COMMUNITY): Payer: Self-pay

## 2021-03-15 NOTE — Progress Notes (Addendum)
COVID swab appointment: N/A  COVID Vaccine Completed:  Yes x4 Date COVID Vaccine completed: 11-26-19, 12-17-19 Has received booster: 06-09-20, 02-10-21 COVID vaccine manufacturer: [employer]       Date of COVID positive in last 90 days:  No  PCP - Wells Guiles, DO Cardiologist - Gayla Doss, MD Pulmonologist - [name], MD  Cardiac clearance in Epic dated 02-25-21 by Coletta Memos NP-C  Chest x-ray - CT chest 03-07-21 Epic EKG - 01-17-21 Epic Stress Test - 2019 [facility] Everywhere ECHO - 01-14-21 Epic Cardiac Cath -  N/A Pacemaker/ICD device last checked: Spinal Cord Stimulator: Long Term Monitor - 2020 Epic  Sleep Study - N/A CPAP -   Fasting Blood Sugar - N/A Checks Blood Sugar _____ times a day  Blood Thinner Instructions: Eliquis.  Hold 3 days prior to procedure.  Patient is aware Aspirin Instructions: Last Dose:  Activity level:  Can go up a flight of stairs and perform activities of daily living without stopping and without symptoms of chest pain or shortness of breath.  Able to exercise without symptoms   Anesthesia review:  Afib, hx of chronic aspiration, recent lipoid pneumonia, interstitial lung disease  Patient denies shortness of breath, fever, cough and chest pain at PAT appointment   Patient verbalized understanding of instructions that were given to them at the PAT appointment. Patient was also instructed that they will need to review over the PAT instructions again at home before surgery.

## 2021-03-15 NOTE — Patient Instructions (Addendum)
DUE TO COVID-19 ONLY ONE VISITOR IS ALLOWED TO COME WITH YOU AND STAY IN THE WAITING ROOM ONLY DURING PRE OP AND PROCEDURE.   **NO VISITORS ARE ALLOWED IN THE SHORT STAY AREA OR RECOVERY ROOM!!**         Your procedure is scheduled on: Tuesday, 03-22-21   Report to [facility] Main  Entrance    Report to admitting at 7:45 AM   Call this number if you have problems the morning of surgery [phone]   Do not eat food :After Midnight.   May have liquids until 7:00 AM day of surgery  CLEAR LIQUID DIET  Foods Allowed                                                                     Foods Excluded  Water, Black Coffee (no milk/no creamer) and tea, regular and decaf                              liquids that you cannot  Plain Jell-O in any flavor  (No red)                         see through such as: Fruit ices (not with fruit pulp)                                 milk, soups, orange juice  Iced Popsicles (No red)                                    All solid food                             Apple juices Sports drinks like Gatorade (No red) Lightly seasoned clear broth or consume(fat free) Sugar      Oral Hygiene is also important to reduce your risk of infection.                                    Remember - BRUSH YOUR TEETH THE MORNING OF SURGERY WITH YOUR REGULAR TOOTHPASTE   Do NOT smoke after Midnight  Take these medicines the morning of surgery with A SIP OF WATER:  Acetaminophen, Famotidine, Metoprolol, Omeprazole, Tamsulosin, Venlafaxine.  Ondansetron and Tramadol if needed                    Eliquis stop 3 days prior to surgery   Stop all vitamins and herbal supplements a week before surgery   You may not have any metal on your body including  jewelry, and body piercing             Do not wear  lotions, powders, cologne, or deodorant              Men may shave face and neck.  Do not bring valuables to the hospital. [place]  VALUABLES.   Contacts, dentures or bridgework may not be worn into surgery.    Patients discharged the day of surgery will not be allowed to drive home.  Please read over the following fact sheets you were given: IF YOU HAVE QUESTIONS ABOUT YOUR PRE OP INSTRUCTIONS PLEASE CALL [place] - Preparing for Surgery Before surgery, you can play an important role.  Because skin is not sterile, your skin needs to be as free of germs as possible.  You can reduce the number of germs on your skin by washing with CHG (chlorahexidine gluconate) soap before surgery.  CHG is an antiseptic cleaner which kills germs and bonds with the skin to continue killing germs even after washing. Please DO NOT use if you have an allergy to CHG or antibacterial soaps.  If your skin becomes reddened/irritated stop using the CHG and inform your nurse when you arrive at Short Stay. Do not shave (including legs and underarms) for at least 48 hours prior to the first CHG shower.  You may shave your face/neck.  Please follow these instructions carefully:  1.  Shower with CHG Soap the night before surgery and the  morning of surgery.  2.  If you choose to wash your hair, wash your hair first as usual with your normal  shampoo.  3.  After you shampoo, rinse your hair and body thoroughly to remove the shampoo.                             4.  Use CHG as you would any other liquid soap.  You can apply chg directly to the skin and wash.  Gently with a scrungie or clean washcloth.  5.  Apply the CHG Soap to your body ONLY FROM THE NECK DOWN.   Do   not use on face/ open                           Wound or open sores. Avoid contact with eyes, ears mouth and   genitals (private parts).                       Wash face,  Genitals (private parts) with your normal soap.             6.  Wash thoroughly, paying special attention to the area where your    surgery  will be performed.  7.  Thoroughly rinse your body with warm  water from the neck down.  8.  DO NOT shower/wash with your normal soap after using and rinsing off the CHG Soap.                9.  Pat yourself dry with a clean towel.            10.  Wear clean pajamas.            11.  Place clean sheets on your bed the night of your first shower and do not  sleep with pets. Day of Surgery : Do not apply any lotions/deodorants the morning of surgery.  Please wear clean clothes to the hospital/surgery center.  FAILURE TO FOLLOW THESE INSTRUCTIONS MAY RESULT IN THE CANCELLATION OF YOUR SURGERY  PATIENT SIGNATURE_________________________________  NURSE SIGNATURE__________________________________  ________________________________________________________________________

## 2021-03-15 NOTE — Progress Notes (Signed)
Requested orders with Dr. Gala Lewandowsky office.

## 2021-03-16 ENCOUNTER — Encounter (HOSPITAL_COMMUNITY): Payer: Self-pay

## 2021-03-16 ENCOUNTER — Other Ambulatory Visit: Payer: Self-pay

## 2021-03-16 ENCOUNTER — Encounter (HOSPITAL_COMMUNITY)
Admission: RE | Admit: 2021-03-16 | Discharge: 2021-03-16 | Disposition: A | Discharge status: Home / Self Care | Payer: Medicare HMO | Source: Ambulatory Visit | Attending: Surgery | Admitting: Surgery

## 2021-03-16 DIAGNOSIS — K4090 Unilateral inguinal hernia, without obstruction or gangrene, not specified as recurrent: Secondary | ICD-10-CM | POA: Diagnosis not present

## 2021-03-16 DIAGNOSIS — J849 Interstitial pulmonary disease, unspecified: Secondary | ICD-10-CM | POA: Insufficient documentation

## 2021-03-16 DIAGNOSIS — I4891 Unspecified atrial fibrillation: Secondary | ICD-10-CM | POA: Insufficient documentation

## 2021-03-16 DIAGNOSIS — Z8501 Personal history of malignant neoplasm of esophagus: Secondary | ICD-10-CM | POA: Insufficient documentation

## 2021-03-16 DIAGNOSIS — Z7901 Long term (current) use of anticoagulants: Secondary | ICD-10-CM | POA: Insufficient documentation

## 2021-03-16 DIAGNOSIS — K219 Gastro-esophageal reflux disease without esophagitis: Secondary | ICD-10-CM | POA: Diagnosis not present

## 2021-03-16 DIAGNOSIS — Z79899 Other long term (current) drug therapy: Secondary | ICD-10-CM | POA: Diagnosis not present

## 2021-03-16 DIAGNOSIS — Z01812 Encounter for preprocedural laboratory examination: Secondary | ICD-10-CM | POA: Diagnosis not present

## 2021-03-16 HISTORY — DX: Pneumonitis due to inhalation of oils and essences: J69.1

## 2021-03-16 HISTORY — DX: Interstitial pulmonary disease, unspecified: J84.9

## 2021-03-16 HISTORY — DX: Unspecified foreign body in respiratory tract, part unspecified causing other injury, initial encounter: T17.908A

## 2021-03-16 LAB — BASIC METABOLIC PANEL
Anion gap: 7 (ref 5–15)
BUN: 14 mg/dL (ref 8–23)
CO2: 25 mmol/L (ref 22–32)
Calcium: 9 mg/dL (ref 8.9–10.3)
Chloride: 105 mmol/L (ref 98–111)
Creatinine, Ser: 0.88 mg/dL (ref 0.61–1.24)
GFR, Estimated: >60 mL/min (ref >60)
Glucose, Bld: 105 mg/dL — ABNORMAL HIGH (ref 70–99)
Potassium: 4.6 mmol/L (ref 3.5–5.1)
Sodium: 137 mmol/L (ref 135–145)

## 2021-03-16 LAB — CBC
HCT: 47 % (ref 39.0–52.0)
Hemoglobin: 16 g/dL (ref 13.0–17.0)
MCH: 32.8 pg (ref 26.0–34.0)
MCHC: 34 g/dL (ref 30.0–36.0)
MCV: 96.3 fL (ref 80.0–100.0)
Platelets: 177 10*3/uL (ref 150–400)
RBC: 4.88 MIL/uL (ref 4.22–5.81)
RDW: 12.5 % (ref 11.5–15.5)
WBC: 6 10*3/uL (ref 4.0–10.5)
nRBC: 0 % (ref 0.0–0.2)

## 2021-03-17 NOTE — Progress Notes (Signed)
Anesthesia Chart Review   Case: [id number] Date/Time: 03/22/21 0945   Procedure: OPEN REPAIR LEFT INGUINAL HERNIA WITH MESH (Left)   Anesthesia type: General   Pre-op diagnosis: LEFT INGUINAL HERNIA, REDUCIBLE   Location: [facility] 04 / [facility]   Surgeons: [name], MD       DISCUSSION:75 y.o. never smoker with h/o GERD, ILD, atrial fibrillation (on Eliquis), esophageal cancer s/p transhiatal esophagogastrectomy 1997, left inguinal hernia scheduled for above procedure 03/22/2021 with Dr. [name].   Per cardiology preoperative evaluation 02/25/21, "Chart reviewed as part of pre-operative protocol coverage. Given past medical history and time since last visit, based on ACC/AHA guidelines, Fallon L. Shiraishi would be at acceptable risk for the planned procedure without further cardiovascular testing."  Advised to hold Eliquis 3 days prior to surgery.   Pt last seen by pulmonology 09/20/2020. Per OV note stable, sx improved, no longer using albuterol.   Anticipate pt can proceed with planned procedure barring acute status change.   VS: BP 130/84   Pulse 77   Temp 36.6 C (Oral)   Resp 20   Ht 6\' 1"  (1.854 m)   Wt 93.4 kg   SpO2 97%   BMI 27.15 kg/m   PROVIDERS: Wells Guiles, DO is PCP   Gayla Doss, MD is Cardiologist  LABS: Labs reviewed: Acceptable for surgery. (all labs ordered are listed, but only abnormal results are displayed)  Labs Reviewed  BASIC METABOLIC PANEL - Abnormal; Notable for the following components:      Result Value   Glucose, Bld 105 (*)    All other components within normal limits  CBC     IMAGES:   EKG: 01/17/2021 Rate 67 bpm  Sinus rhythm with 1st degree AV block   CV: Echo 01/14/21  1. Left ventricular ejection fraction, by estimation, is 60 to 65%. The  left ventricle has normal function. The left ventricle has no regional  wall motion abnormalities. Left ventricular diastolic parameters were  normal. The average left ventricular   global longitudinal strain is -21.3 %. The global longitudinal strain is  normal.   2. Right ventricular systolic function is normal. The right ventricular  size is normal. There is normal pulmonary artery systolic pressure.   3. The mitral valve is normal in structure. Trivial mitral valve  regurgitation. No evidence of mitral stenosis.   4. The aortic valve is tricuspid. There is mild calcification of the  aortic valve. Aortic valve regurgitation is trivial. Mild aortic valve  sclerosis is present, with no evidence of aortic valve stenosis.   5. The inferior vena cava is normal in size with greater than 50%  respiratory variability, suggesting right atrial pressure of 3 mmHg.  Past Medical History:  Diagnosis Date   Atrial fibrillation ([place]) 01/17/2019   BPH (benign prostatic hyperplasia) 01/17/2019   Chronic pulmonary aspiration    Degenerative disc disease, lumbar 01/17/2019   Depression, major, single episode, complete remission ([place]) 01/17/2019   Diverticulosis 01/17/2019   Dysrhythmia    Esophageal cancer (HCC)    GERD (gastroesophageal reflux disease)    H/O small bowel obstruction 01/17/2019   Hiatal hernia 01/17/2019   Hypertension    ILD (interstitial lung disease) ([place])    Inguinal hernia    bilateral, left more prominant   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[place])    Pneumonia 11/2019   SBO (small bowel obstruction) ([place]) 11/20/2019   Wears glasses     Past Surgical History:  Procedure Laterality Date   ABDOMINAL  HERNIA REPAIR  2012, 2014, 2017   ventral   BRONCHIAL BIOPSY  04/08/2020   Procedure: BRONCHIAL BIOPSIES;  Surgeon: Marshell Garfinkel, MD;  [facility];  Service: Cardiopulmonary;;   BRONCHIAL BRUSHINGS  04/08/2020   Procedure: BRONCHIAL BRUSHINGS;  Surgeon: Marshell Garfinkel, MD;  [facility];  Service: Cardiopulmonary;;   BRONCHIAL WASHINGS  04/08/2020   Procedure: BRONCHIAL WASHINGS;  Surgeon: Marshell Garfinkel, MD;  [facility];  Service:  Cardiopulmonary;;   COLONOSCOPY  2019   ESOPHAGECTOMY  1997   for esophageal cancer   ESOPHAGOGASTRODUODENOSCOPY (EGD) WITH PROPOFOL N/A 06/11/2020   Procedure: ESOPHAGOGASTRODUODENOSCOPY (EGD) WITH PROPOFOL;  Surgeon: Mauri Pole, MD;  [facility];  Service: Endoscopy;  Laterality: N/A;   PARATHYROIDECTOMY Right 01/26/2020   Procedure: RIGHT INFERIOR PARATHYROIDECTOMY;  Surgeon: [name], MD;  [facility];  Service: General;  Laterality: Right;   SPINAL FUSION  2018, 2020   L2 L5 in 2018, L5-S1 2020   VIDEO BRONCHOSCOPY N/A 04/08/2020   Procedure: VIDEO BRONCHOSCOPY WITH FLUORO;  Surgeon: Marshell Garfinkel, MD;  [facility];  Service: Cardiopulmonary;  Laterality: N/A;    MEDICATIONS:  acetaminophen (TYLENOL) 325 MG tablet   albuterol (VENTOLIN HFA) 108 (90 Base) MCG/ACT inhaler   apixaban (ELIQUIS) 5 MG TABS tablet   famotidine (PEPCID) 20 MG tablet   Hyoscyamine Sulfate SL (LEVSIN/SL) 0.125 MG SUBL   methocarbamol (ROBAXIN) 750 MG tablet   metoprolol succinate (TOPROL XL) 25 MG 24 hr tablet   Multiple Vitamins-Minerals (CENTRUM SILVER 50+MEN) TABS   omeprazole (PRILOSEC) 40 MG capsule   ondansetron (ZOFRAN ODT) 4 MG disintegrating tablet   ondansetron (ZOFRAN) 4 MG tablet   propafenone (RYTHMOL) 225 MG tablet   tamsulosin (FLOMAX) 0.4 MG CAPS capsule   traMADol (ULTRAM) 50 MG tablet   venlafaxine XR (EFFEXOR-XR) 150 MG 24 hr capsule   No current facility-administered medications for this encounter.    Konrad Felix Ward, PA-C [facility] [phone]

## 2021-03-17 NOTE — Anesthesia Preprocedure Evaluation (Addendum)
Anesthesia Evaluation  Patient identified by MRN, date of birth, ID band Patient awake    Reviewed: Allergy & Precautions, NPO status , Patient's Chart, lab work & pertinent test results, reviewed documented beta blocker date and time   History of Anesthesia Complications Negative for: history of anesthetic complications  Airway Mallampati: II  TM Distance: >3 FB Neck ROM: Full    Dental  (+) Dental Advisory Given   Pulmonary COPD,  COPD inhaler,  H/o interstitial lung disease: does not require supplemental O2   breath sounds clear to auscultation       Cardiovascular hypertension, Pt. on medications and Pt. on home beta blockers (-) angina+ dysrhythmias Atrial Fibrillation  Rhythm:Irregular Rate:Normal  12/2020 ECHO: EF 60-65%, normal LVF, no significant valvular abnormalities   Neuro/Psych  Headaches, Depression    GI/Hepatic Neg liver ROS, GERD  Medicated and Controlled,H/o esophageal cancer H/o SBO   Endo/Other    Renal/GU negative Renal ROS     Musculoskeletal  (+) Arthritis ,   Abdominal   Peds  Hematology Eliquis: last dose saturday   Anesthesia Other Findings   Reproductive/Obstetrics                           Anesthesia Physical Anesthesia Plan  ASA: 3  Anesthesia Plan: General   Post-op Pain Management:    Induction: Intravenous  PONV Risk Score and Plan: 2 and Ondansetron and Dexamethasone  Airway Management Planned: Oral ETT  Additional Equipment: None  Intra-op Plan:   Post-operative Plan: Extubation in OR  Informed Consent: I have reviewed the patients History and Physical, chart, labs and discussed the procedure including the risks, benefits and alternatives for the proposed anesthesia with the patient or authorized representative who has indicated his/her understanding and acceptance.     Dental advisory given  Plan Discussed with: CRNA and  Surgeon  Anesthesia Plan Comments: (See PAT note 03/16/21, Konrad Felix Ward, PA-C)      Anesthesia Quick Evaluation

## 2021-03-21 ENCOUNTER — Ambulatory Visit: Payer: Self-pay | Admitting: Surgery

## 2021-03-21 ENCOUNTER — Encounter (HOSPITAL_COMMUNITY): Payer: Self-pay | Admitting: Surgery

## 2021-03-21 DIAGNOSIS — K4090 Unilateral inguinal hernia, without obstruction or gangrene, not specified as recurrent: Secondary | ICD-10-CM

## 2021-03-21 HISTORY — DX: Unilateral inguinal hernia, without obstruction or gangrene, not specified as recurrent: K40.90

## 2021-03-21 MED ORDER — BUPIVACAINE LIPOSOME 1.3 % IJ SUSP: 20.0000 mL | Freq: Once | INTRAMUSCULAR | Status: DC

## 2021-03-21 NOTE — H&P (Signed)
Chief Complaint: New Consultation (Evaluate inguinal hernia)   History of Present Illness:  Patient returns to my transferring by patient new problem developed over the past several months bilateral intermittent groin pain. Patient had also been admitted during the past months with small bowel obstruction. CT scan of the abdomen did demonstrated small bilateral fat-containing inguinal hernias. Patient has had a dull ache in the groin region. He has had no sharp pain. He has not seen a bulge. He has not had to manually reduce the hernia. He has had no signs or symptoms of intestinal obstruction related to the groin area. The small bowel obstruction that he did have resolved with medical management in the hospital. The point of obstruction was near where a small bowel repair had been performed at [facility] related to his treatment for esophageal cancer. Patient has had no prior inguinal hernia repairs.  Review of Systems: A complete review of systems was obtained from the patient. I have reviewed this information and discussed as appropriate with the patient. See HPI as well for other ROS.  Review of Systems  Constitutional: Negative.  HENT: Negative.  Eyes: Negative.  Respiratory: Positive for shortness of breath.  Cardiovascular: Negative.  Gastrointestinal: Negative.  Genitourinary: Negative.  Musculoskeletal: Negative.  Groin pain  Skin: Negative.  Neurological: Negative.  Endo/Heme/Allergies: Negative.  Psychiatric/Behavioral: Negative.    Medical History: Past Medical History:  Diagnosis Date   Atrial fibrillation (CMS-HCC)   Esophageal cancer (CMS-HCC)   Esophageal varices (CMS-HCC)   GERD (gastroesophageal reflux disease)   H/O gastroesophageal reflux (GERD)   Hypertension   Interstitial lung disease (CMS-HCC)   Wears glasses   Patient Active Problem List  Diagnosis   Dysrhythmias   GERD (gastroesophageal reflux disease)   Paraesophageal  hernia   Constipation   Esophageal cancer (CMS-HCC)   Partial small bowel obstruction (CMS-HCC)   Incisional hernia   Atrial fibrillation (CMS-HCC)   Interstitial lung disease (CMS-HCC)   Aspiration, chronic pulmonary, subsequent encounter   Left inguinal hernia   Past Surgical History:  Procedure Laterality Date   back surgery   EGD N/A 12/19/2011  Procedure: EGD; Surgeon: Andee Lineman, MD; Location: [facility]; Service: Thoracic Surgery; Laterality: N/A;   ESOPHAGECTOMY THORACOABDOMINAL W/WO GASTRECTOMY W/ESOPHAGOGASTROSTOMY Bilateral  in past   ESOPHAGOGASTRODOUDENOSCOPY W/DILITATION GASTRIC OUTLET N/A 08/30/2020  Procedure: ESOPHAGOGASTRODUODENOSCOPY, FLEXIBLE, TRANSORAL; WITH DILATION OFGASTRIC/DUODENAL STRICTURE(S) (EG, BALLOON); Surgeon: Guadalupe Dawn, MD; Location: [facility]; Service: Cardiothoracic; Laterality: N/A;   HERNIA REPAIR  primary ventral hernia repair in 2001, open ventral repair with mesh in 2003, lap ventral repair with mesh 2004   PARATHYROIDECTOMY N/A   REPAIR INCISIONAL VENTRAL HERNIA RECURRENT   REPAIR INCISIONAL/VENTRAL HERNIA N/A 06/11/2012  Procedure: LAPAROSCOPIC REPAIR INCISIONAL/VENTRAL HERNIA WITH MESH; Surgeon: Thayer Ohm., MD; Location: [place]; Service: General Surgery; Laterality: N/A;   REPAIR PARAESOPHAGEAL HIATAL HERNIA FUNDOPLICATION W/MESH/PROSTHESIS N/A 12/19/2011  Procedure: REPAIR PARAESOPHAGEAL HIATAL HERNIA FUNDOPLICATION W/MESH/PROSTHESIS, ROBOT and SMALL BOWEL RESECTION; Surgeon: Andee Lineman, MD; Location: [place]; Service: Thoracic Surgery; Laterality: N/A; Robotic Attempted    Allergies  Allergen Reactions   Meloxicam Muscle Pain and Other (See Comments)  SOB and muscle pain   Statins-Hmg-Coa Reductase Inhibitors Other (See Comments)  myalgias, weakness   Current Outpatient Medications on File Prior to Visit  Medication Sig Dispense Refill   apixaban (ELIQUIS) 5 mg tablet Take 5 mg  by mouth 2 (two) times daily   metoprolol tartrate (  LOPRESSOR) 25 MG tablet TAKE 0.5 TABLETS BY MOUTH 2 TIMES DAILY.   omeprazole (PRILOSEC) 40 MG DR capsule Take 40 mg by mouth 2 (two) times daily before meals   ondansetron (ZOFRAN) 4 MG tablet ondansetron HCl 4 mg tablet   propafenone (RYTHMOL) 225 MG tablet Take 225 mg by mouth every 8 (eight) hours   tamsulosin (FLOMAX) 0.4 mg capsule Take 0.4 mg by mouth daily. Take 30 minutes after same meal each day.   traMADoL (ULTRAM) 50 mg tablet tramadol 50 mg tablet   venlafaxine (EFFEXOR-XR) 150 MG XR capsule TAKE 1 CAPSULE EVERY DAY WITH BREAKFAST   acetaminophen (TYLENOL) 500 MG tablet Take 500 mg by mouth every 6 (six) hours as needed. (Patient not taking: Reported on 02/23/2021)   albuterol 90 mcg/actuation inhaler Inhale into the lungs   diltiazem (CARDIZEM CD) 120 MG XR capsule diltiazem ER (XR/XT) 120 mg capsule,extended release 24 hr, controlled (Patient not taking: Reported on 02/23/2021)   famotidine (PEPCID) 20 MG tablet Take 20 mg by mouth nightly (Patient not taking: Reported on 02/23/2021)   methocarbamoL (ROBAXIN) 750 MG tablet TAKE 1 TABLET TWICE DAILY AS NEEDED FOR MUSCLE SPASM(S) (Patient not taking: Reported on 02/23/2021)   multivitamin-minerals-FA-lutein (CENTRUM SILVER) 0.4 mg-300 mcg- 250 mcg tablet Take 1 tablet by mouth daily. (Patient not taking: Reported on 02/23/2021)   No current facility-administered medications on file prior to visit.   Family History  Problem Relation Age of Onset   Coronary Artery Disease (Blocked arteries around heart) Sister  had MI in her 51s   Cancer Mother  died from gynecological cancer at 45   Stroke Neg Hx   Anesthesia problems Neg Hx   Malignant hyperthermia Neg Hx    Social History   Tobacco Use  Smoking Status Never Smoker  Smokeless Tobacco Never Used    Social History   Socioeconomic History   Marital status: Married  Tobacco Use   Smoking status: Never Smoker   Smokeless  tobacco: Never Used  Scientific laboratory technician Use: Never used  Substance and Sexual Activity   Alcohol use: Not Currently  Comment: occasional   Drug use: No   Sexual activity: Yes  Partners: Female   Objective:   Vitals:  Pulse: 109  Temp: 36.7 C (98 F)  SpO2: 97%  Weight: 93.9 kg (207 lb)  Height: 185.4 cm (6\' 1" )   Body mass index is 27.31 kg/m.  Physical Exam   GENERAL APPEARANCE Development: normal Nutritional status: normal Gross deformities: none  SKIN Rash, lesions, ulcers: none Induration, erythema: none Nodules: none palpable  EYES Conjunctiva and lids: normal Pupils: equal and reactive Iris: normal bilaterally  EARS, NOSE, MOUTH, THROAT External ears: no lesion or deformity External nose: no lesion or deformity Hearing: grossly normal Due to Covid-19 pandemic, patient is wearing a mask.  NECK Symmetric: yes Trachea: midline Thyroid: no palpable nodules in the thyroid bed  CHEST Respiratory effort: normal Retraction or accessory muscle use: no Breath sounds: normal bilaterally Rales, rhonchi, wheeze: none  CARDIOVASCULAR Auscultation: regular rhythm, normal rate Murmurs: none Pulses: radial pulse 2+ palpable Lower extremity edema: none  ABDOMEN Distension: none Masses: none palpable Tenderness: none Hepatosplenomegaly: not present Hernia: not present Well-healed complex midline abdominal incision  GENITOURINARY Penis: no lesions Scrotum: no masses; left testicle slightly larger than right, possibly with a varicocele Palpation in the right inguinal canal with cough and Valsalva shows no sign of inguinal hernia. Palpation on the left side shows a small  to moderate sized bulge which is soft, mildly tender to palpation, and reducible. It augments with cough and Valsalva.  MUSCULOSKELETAL Station and gait: normal Digits and nails: no clubbing or cyanosis Muscle strength: grossly normal all extremities Range of motion: grossly normal all  extremities Deformity: none  LYMPHATIC Cervical: none palpable Supraclavicular: none palpable  PSYCHIATRIC Oriented to person, place, and time: yes Mood and affect: normal for situation Judgment and insight: appropriate for situation   Assessment and Plan:   Left inguinal hernia  Patient presents for evaluation of inguinal hernia. CT scan demonstrated small fat-containing inguinal hernias bilaterally. On clinical examination, the patient does have a left inguinal hernia. There is not a significant hernia to be identified by palpation on the right side.  Today we discussed proceeding with left inguinal hernia repair with mesh. We discussed the risk and benefits of the procedure. We discussed using prosthetic mesh. We discussed the risk of recurrence. We discussed the restrictions on his activities after the procedure. The patient understands and wishes to proceed in the near future.  We will request clearance from his cardiologist. Patient is currently on Eliquis for atrial fibrillation. This will need to be discontinued 3 days prior to surgery and the patient may start this again on the day following surgery as long as this regimen is acceptable to his cardiologist.  The risks and benefits of the procedure have been discussed at length with the patient. The patient understands the proposed procedure, potential alternative treatments, and the course of recovery to be expected. All of the patient's questions have been answered at this time. The patient wishes to proceed with surgery.   [name], MD [facility] [place] practice Office: [phone]

## 2021-03-22 ENCOUNTER — Ambulatory Visit (HOSPITAL_COMMUNITY): Payer: Medicare HMO | Admitting: Physician Assistant

## 2021-03-22 ENCOUNTER — Ambulatory Visit (HOSPITAL_COMMUNITY): Payer: Medicare HMO | Admitting: Certified Registered Nurse Anesthetist

## 2021-03-22 ENCOUNTER — Encounter (HOSPITAL_COMMUNITY): Payer: Self-pay | Admitting: Surgery

## 2021-03-22 ENCOUNTER — Ambulatory Visit (HOSPITAL_COMMUNITY)
Admission: RE | Admit: 2021-03-22 | Discharge: 2021-03-22 | Disposition: A | Discharge status: Home / Self Care | Payer: Medicare HMO | Source: Ambulatory Visit | Attending: Surgery | Admitting: Surgery

## 2021-03-22 ENCOUNTER — Encounter (HOSPITAL_COMMUNITY)
Admission: RE | Disposition: A | Discharge status: Home / Self Care | Payer: Self-pay | Source: Ambulatory Visit | Attending: Surgery

## 2021-03-22 DIAGNOSIS — Z8501 Personal history of malignant neoplasm of esophagus: Secondary | ICD-10-CM | POA: Insufficient documentation

## 2021-03-22 DIAGNOSIS — Z8049 Family history of malignant neoplasm of other genital organs: Secondary | ICD-10-CM | POA: Diagnosis not present

## 2021-03-22 DIAGNOSIS — Z8249 Family history of ischemic heart disease and other diseases of the circulatory system: Secondary | ICD-10-CM | POA: Insufficient documentation

## 2021-03-22 DIAGNOSIS — Z7901 Long term (current) use of anticoagulants: Secondary | ICD-10-CM | POA: Diagnosis not present

## 2021-03-22 DIAGNOSIS — K4090 Unilateral inguinal hernia, without obstruction or gangrene, not specified as recurrent: Secondary | ICD-10-CM | POA: Diagnosis not present

## 2021-03-22 DIAGNOSIS — I4891 Unspecified atrial fibrillation: Secondary | ICD-10-CM | POA: Insufficient documentation

## 2021-03-22 DIAGNOSIS — Z888 Allergy status to other drugs, medicaments and biological substances status: Secondary | ICD-10-CM | POA: Insufficient documentation

## 2021-03-22 DIAGNOSIS — I10 Essential (primary) hypertension: Secondary | ICD-10-CM | POA: Diagnosis not present

## 2021-03-22 DIAGNOSIS — E785 Hyperlipidemia, unspecified: Secondary | ICD-10-CM | POA: Diagnosis not present

## 2021-03-22 DIAGNOSIS — Z79899 Other long term (current) drug therapy: Secondary | ICD-10-CM | POA: Insufficient documentation

## 2021-03-22 DIAGNOSIS — K219 Gastro-esophageal reflux disease without esophagitis: Secondary | ICD-10-CM | POA: Diagnosis not present

## 2021-03-22 HISTORY — PX: INGUINAL HERNIA REPAIR: SHX194

## 2021-03-22 SURGERY — REPAIR, HERNIA, INGUINAL, ADULT
Anesthesia: General | Site: Groin | Laterality: Left

## 2021-03-22 MED ORDER — CHLORHEXIDINE GLUCONATE 0.12 % MT SOLN
15.0000 mL | Freq: Once | OROMUCOSAL | Status: AC
  Administered 2021-03-22: 15 mL via OROMUCOSAL

## 2021-03-22 MED ORDER — FENTANYL CITRATE (PF) 100 MCG/2ML IJ SOLN
INTRAMUSCULAR | Status: AC
  Filled 2021-03-22: qty 2

## 2021-03-22 MED ORDER — BUPIVACAINE HCL (PF) 0.5 % IJ SOLN
INTRAMUSCULAR | Status: DC | PRN
  Administered 2021-03-22: 30 mL

## 2021-03-22 MED ORDER — BUPIVACAINE HCL (PF) 0.5 % IJ SOLN
INTRAMUSCULAR | Status: AC
  Filled 2021-03-22: qty 30

## 2021-03-22 MED ORDER — FENTANYL CITRATE (PF) 100 MCG/2ML IJ SOLN
INTRAMUSCULAR | Status: DC | PRN
  Administered 2021-03-22 (×2): 50 ug via INTRAVENOUS

## 2021-03-22 MED ORDER — MEPERIDINE HCL 50 MG/ML IJ SOLN: 6.2500 mg | INTRAMUSCULAR | Status: DC | PRN

## 2021-03-22 MED ORDER — OXYCODONE HCL 5 MG PO TABS: 5.0000 mg | ORAL_TABLET | Freq: Once | ORAL | Status: AC | PRN

## 2021-03-22 MED ORDER — SCOPOLAMINE 1 MG/3DAYS TD PT72
MEDICATED_PATCH | TRANSDERMAL | Status: DC | PRN
  Administered 2021-03-22: 1 via TRANSDERMAL

## 2021-03-22 MED ORDER — HYDROMORPHONE HCL 1 MG/ML IJ SOLN
0.2500 mg | INTRAMUSCULAR | Status: DC | PRN
  Administered 2021-03-22 (×3): 0.25 mg via INTRAVENOUS
  Administered 2021-03-22: 0.5 mg via INTRAVENOUS

## 2021-03-22 MED ORDER — OXYCODONE HCL 5 MG/5ML PO SOLN: 5.0000 mg | Freq: Once | ORAL | Status: AC | PRN

## 2021-03-22 MED ORDER — DEXAMETHASONE SODIUM PHOSPHATE 10 MG/ML IJ SOLN
INTRAMUSCULAR | Status: DC | PRN
  Administered 2021-03-22: 8 mg via INTRAVENOUS

## 2021-03-22 MED ORDER — SUCCINYLCHOLINE CHLORIDE 200 MG/10ML IV SOSY
PREFILLED_SYRINGE | INTRAVENOUS | Status: AC
  Filled 2021-03-22: qty 10

## 2021-03-22 MED ORDER — ROCURONIUM BROMIDE 10 MG/ML (PF) SYRINGE
PREFILLED_SYRINGE | INTRAVENOUS | Status: AC
  Filled 2021-03-22: qty 10

## 2021-03-22 MED ORDER — DEXAMETHASONE SODIUM PHOSPHATE 10 MG/ML IJ SOLN
INTRAMUSCULAR | Status: AC
  Filled 2021-03-22: qty 1

## 2021-03-22 MED ORDER — SUCCINYLCHOLINE CHLORIDE 200 MG/10ML IV SOSY
PREFILLED_SYRINGE | INTRAVENOUS | Status: DC | PRN
  Administered 2021-03-22: 120 mg via INTRAVENOUS

## 2021-03-22 MED ORDER — FENTANYL CITRATE PF 50 MCG/ML IJ SOSY: 50.0000 ug | PREFILLED_SYRINGE | INTRAMUSCULAR | Status: DC

## 2021-03-22 MED ORDER — PROPOFOL 10 MG/ML IV BOLUS
INTRAVENOUS | Status: DC | PRN
  Administered 2021-03-22: 150 mg via INTRAVENOUS

## 2021-03-22 MED ORDER — CHLORHEXIDINE GLUCONATE CLOTH 2 % EX PADS: 6.0000 | MEDICATED_PAD | Freq: Once | CUTANEOUS | Status: DC

## 2021-03-22 MED ORDER — PROPOFOL 10 MG/ML IV BOLUS
INTRAVENOUS | Status: AC
  Filled 2021-03-22: qty 20

## 2021-03-22 MED ORDER — LIDOCAINE 2% (20 MG/ML) 5 ML SYRINGE
INTRAMUSCULAR | Status: DC | PRN
  Administered 2021-03-22: 100 mg via INTRAVENOUS

## 2021-03-22 MED ORDER — HYDROMORPHONE HCL 1 MG/ML IJ SOLN
INTRAMUSCULAR | Status: AC
  Administered 2021-03-22: 0.5 mg via INTRAVENOUS
  Filled 2021-03-22: qty 1

## 2021-03-22 MED ORDER — ONDANSETRON HCL 4 MG/2ML IJ SOLN
INTRAMUSCULAR | Status: DC | PRN
  Administered 2021-03-22: 4 mg via INTRAVENOUS

## 2021-03-22 MED ORDER — BUPIVACAINE LIPOSOME 1.3 % IJ SUSP
20.0000 mL | Freq: Once | INTRAMUSCULAR | Status: AC
  Administered 2021-03-22: 20 mL

## 2021-03-22 MED ORDER — MIDAZOLAM HCL 2 MG/2ML IJ SOLN: 0.5000 mg | Freq: Once | INTRAMUSCULAR | Status: DC | PRN

## 2021-03-22 MED ORDER — LACTATED RINGERS IV SOLN: INTRAVENOUS | Status: DC

## 2021-03-22 MED ORDER — SCOPOLAMINE 1 MG/3DAYS TD PT72
MEDICATED_PATCH | TRANSDERMAL | Status: AC
  Filled 2021-03-22: qty 1

## 2021-03-22 MED ORDER — CEFAZOLIN SODIUM-DEXTROSE 2-4 GM/100ML-% IV SOLN
2.0000 g | INTRAVENOUS | Status: AC
  Administered 2021-03-22: 2 g via INTRAVENOUS
  Filled 2021-03-22: qty 100

## 2021-03-22 MED ORDER — PROMETHAZINE HCL 25 MG/ML IJ SOLN: 6.2500 mg | INTRAMUSCULAR | Status: DC | PRN

## 2021-03-22 MED ORDER — TRAMADOL HCL 50 MG PO TABS: 50.0000 mg | ORAL_TABLET | Freq: Four times a day (QID) | ORAL | 0 refills | Status: DC | PRN

## 2021-03-22 MED ORDER — BUPIVACAINE LIPOSOME 1.3 % IJ SUSP
INTRAMUSCULAR | Status: AC
  Filled 2021-03-22: qty 20

## 2021-03-22 MED ORDER — PROMETHAZINE HCL 25 MG/ML IJ SOLN
INTRAMUSCULAR | Status: AC
  Administered 2021-03-22: 6.25 mg via INTRAVENOUS
  Filled 2021-03-22: qty 1

## 2021-03-22 MED ORDER — HYDROMORPHONE HCL 1 MG/ML IJ SOLN
INTRAMUSCULAR | Status: AC
  Administered 2021-03-22: 0.25 mg via INTRAVENOUS
  Filled 2021-03-22: qty 1

## 2021-03-22 MED ORDER — ROCURONIUM BROMIDE 10 MG/ML (PF) SYRINGE
PREFILLED_SYRINGE | INTRAVENOUS | Status: DC | PRN
  Administered 2021-03-22: 50 mg via INTRAVENOUS
  Administered 2021-03-22: 10 mg via INTRAVENOUS

## 2021-03-22 MED ORDER — ONDANSETRON HCL 4 MG/2ML IJ SOLN
INTRAMUSCULAR | Status: AC
  Filled 2021-03-22: qty 2

## 2021-03-22 MED ORDER — ORAL CARE MOUTH RINSE: 15.0000 mL | Freq: Once | OROMUCOSAL | Status: AC

## 2021-03-22 MED ORDER — OXYCODONE HCL 5 MG PO TABS
ORAL_TABLET | ORAL | Status: AC
  Administered 2021-03-22: 5 mg via ORAL
  Filled 2021-03-22: qty 1

## 2021-03-22 MED ORDER — MIDAZOLAM HCL 2 MG/2ML IJ SOLN: 1.0000 mg | INTRAMUSCULAR | Status: DC

## 2021-03-22 MED ORDER — SUGAMMADEX SODIUM 200 MG/2ML IV SOLN
INTRAVENOUS | Status: DC | PRN
  Administered 2021-03-22: 200 mg via INTRAVENOUS

## 2021-03-22 SURGICAL SUPPLY — 34 items
BAG COUNTER SPONGE SURGICOUNT (BAG) ×2 IMPLANT
BLADE SURG 15 STRL LF DISP TIS (BLADE) ×1 IMPLANT
BLADE SURG 15 STRL SS (BLADE) ×1
CHLORAPREP W/TINT 26 (MISCELLANEOUS) ×2 IMPLANT
COVER SURGICAL LIGHT HANDLE (MISCELLANEOUS) ×2 IMPLANT
DECANTER SPIKE VIAL GLASS SM (MISCELLANEOUS) IMPLANT
DERMABOND ADVANCED (GAUZE/BANDAGES/DRESSINGS) ×1
DERMABOND ADVANCED .7 DNX12 (GAUZE/BANDAGES/DRESSINGS) ×1 IMPLANT
DRAIN PENROSE 0.5X18 (DRAIN) ×2 IMPLANT
DRAPE LAPAROTOMY TRNSV 102X78 (DRAPES) ×2 IMPLANT
DRAPE UTILITY XL STRL (DRAPES) ×2 IMPLANT
ELECT REM PT RETURN 15FT ADLT (MISCELLANEOUS) ×2 IMPLANT
GLOVE SURG ORTHO LTX SZ8 (GLOVE) ×2 IMPLANT
GLOVE SURG SYN 7.5  E (GLOVE) ×1
GLOVE SURG SYN 7.5 E (GLOVE) ×1 IMPLANT
GLOVE SURG UNDER POLY LF SZ7 (GLOVE) ×2 IMPLANT
GOWN STRL REUS W/TWL XL LVL3 (GOWN DISPOSABLE) ×4 IMPLANT
KIT BASIN OR (CUSTOM PROCEDURE TRAY) ×2 IMPLANT
KIT TURNOVER KIT A (KITS) ×2 IMPLANT
MESH BARD SOFT 3X6IN (Mesh General) ×2 IMPLANT
NEEDLE HYPO 25X1 1.5 SAFETY (NEEDLE) ×2 IMPLANT
NS IRRIG 1000ML POUR BTL (IV SOLUTION) ×2 IMPLANT
PACK BASIC VI WITH GOWN DISP (CUSTOM PROCEDURE TRAY) ×2 IMPLANT
PENCIL SMOKE EVACUATOR (MISCELLANEOUS) ×2 IMPLANT
SPONGE T-LAP 4X18 ~~LOC~~+RFID (SPONGE) ×4 IMPLANT
STRIP CLOSURE SKIN 1/2X4 (GAUZE/BANDAGES/DRESSINGS) ×2 IMPLANT
SUT MNCRL AB 4-0 PS2 18 (SUTURE) ×2 IMPLANT
SUT NOVA NAB GS-21 0 18 T12 DT (SUTURE) IMPLANT
SUT NOVA NAB GS-22 2 0 T19 (SUTURE) ×4 IMPLANT
SUT SILK 2 0 SH (SUTURE) ×2 IMPLANT
SUT VIC AB 3-0 SH 18 (SUTURE) IMPLANT
SYR BULB IRRIG 60ML STRL (SYRINGE) ×2 IMPLANT
SYR CONTROL 10ML LL (SYRINGE) ×2 IMPLANT
TOWEL OR 17X26 10 PK STRL BLUE (TOWEL DISPOSABLE) ×2 IMPLANT

## 2021-03-22 NOTE — Anesthesia Postprocedure Evaluation (Signed)
Anesthesia Post Note  Patient: Thomas Dominguez. [name]  Procedure(s) Performed: OPEN REPAIR LEFT INGUINAL HERNIA WITH MESH (Left: Groin)     Patient location during evaluation: PACU Anesthesia Type: General Level of consciousness: awake and alert, patient cooperative and oriented Pain management: pain level controlled Vital Signs Assessment: post-procedure vital signs reviewed and stable Respiratory status: spontaneous breathing, nonlabored ventilation, respiratory function stable and patient connected to nasal cannula oxygen Cardiovascular status: blood pressure returned to baseline and stable Postop Assessment: no apparent nausea or vomiting (nausea improved) Anesthetic complications: no   No notable events documented.  Last Vitals:  Vitals:   03/22/21 1245 03/22/21 1300  BP: (!) 157/95 (!) 155/96  Pulse: 83 73  Resp: 16 12  Temp:  36.8 C  SpO2: 98% 97%    Last Pain:  Vitals:   03/22/21 1300  TempSrc:   PainSc: 4                  Birdella Sippel,E. Agness Sibrian

## 2021-03-22 NOTE — Anesthesia Procedure Notes (Signed)
Procedure Name: Intubation Date/Time: 03/22/2021 9:57 AM Performed by: West Pugh, CRNA Pre-anesthesia Checklist: Patient identified, Emergency Drugs available, Suction available, Patient being monitored and Timeout performed Patient Re-evaluated:Patient Re-evaluated prior to induction Oxygen Delivery Method: Circle system utilized Preoxygenation: Pre-oxygenation with 100% oxygen Induction Type: IV induction, Rapid sequence and Cricoid Pressure applied Laryngoscope Size: 3 and Glidescope Grade View: Grade I Tube type: Oral Tube size: 7.5 mm Number of attempts: 1 Airway Equipment and Method: Rigid stylet and Video-laryngoscopy Placement Confirmation: ETT inserted through vocal cords under direct vision, positive ETCO2, CO2 detector and breath sounds checked- equal and bilateral Secured at: 22 cm Tube secured with: Tape Dental Injury: Teeth and Oropharynx as per pre-operative assessment  Difficulty Due To: Difficulty was anticipated Comments: Small abrasion noted to right upper lip.

## 2021-03-22 NOTE — Op Note (Signed)
Procedure Note  Pre-operative Diagnosis:  left inguinal hernia, reducible  Post-operative Diagnosis: same  Procedure:  Open left inguinal hernia repair with mesh  Surgeon:  [name], MD  Anesthesia:  General  Preparation:  Chlora-prep  Estimated Blood Loss: minimal  Complications:  none  Indications: Patient returns to my transferring by patient new problem developed over the past several months bilateral intermittent groin pain. Patient had also been admitted during the past months with small bowel obstruction. CT scan of the abdomen did demonstrated small bilateral fat-containing inguinal hernias. Patient has had a dull ache in the groin region. He has had no sharp pain. He has not seen a bulge. He has not had to manually reduce the hernia. He has had no signs or symptoms of intestinal obstruction related to the groin area. The small bowel obstruction that he did have resolved with medical management in the hospital. The point of obstruction was near where a small bowel repair had been performed at [facility] related to his treatment for esophageal cancer. Patient has had no prior inguinal hernia repairs.   Procedure Details  The patient was evaluated in the holding area. All of the patient's questions were answered and the proposed procedure was confirmed. The site of the procedure was properly marked. The patient was taken to the Operating Room, identified by name, and the procedure verified as inguinal hernia repair.  The patient was placed in the supine position and underwent induction of anesthesia. A "Time Out" was performed per routine. The lower abdomen and groin were prepped and draped in the usual aseptic fashion.  After ascertaining that an adequate level of anesthesia had been obtained, an incision was made in the groin with a #10 blade.  Dissection was carried through the subcutaneous tissues and hemostasis obtained with the electrocautery.  A Gelpi  retractor was placed for exposure.  The external oblique fascia was incised in line with it's fibers and extended through the external inguinal ring.  The cord structures were dissected out of the inguinal canal and encircled with a Penrose drain.  The floor of the inguinal canal was dissected out.  There was no evidence of a direct defect.  The cord was explored and moderate sized lipoma and small indirect hernia was identified.  A high ligation was performed with a 2-0 silk suture ligature.  The floor of the inguinal canal was reconstructed with Ethicon Ultrapro mesh cut to the appropriate dimensions.  It was secured to the pubic tubercle with a 2-0 Novafil suture and along the inguinal ligament with a running 2-0 Novafil suture.  Mesh was split to accommodate the cord structures.  The superior margin of the mesh was secured to the transversalis and internal oblique musculature with interrupted 2-0 Novafil sutures.  The tails of the mesh were overlapped lateral to the cord structures and secured to the inguinal ligament with interrupted 2-0 Novafil sutures to recreate the internal inguinal ring.  Cord structures were returned to the inguinal canal.  Local anesthetic was infiltrated throughout the field.  External oblique fascia was closed with interrupted 3-0 Vicryl sutures.  Subcutaneous tissues were closed with interrupted 3-0 Vicryl sutures.  Skin was anesthetized with local anesthetic, and the skin edges were re-approximated with a running 4-0 Monocryl suture.  Wound was washed and dried and Dermabond was applied.  Instrument, sponge, and needle counts were correct prior to closure and at the conclusion of the case.  The patient tolerated the procedure well.  The  patient was awakened from anesthesia and brought to the recovery room in stable condition.  [name], MD [facility] Office: [phone]

## 2021-03-22 NOTE — Discharge Instructions (Addendum)
[facility]  HERNIA REPAIR POST OP INSTRUCTIONS  Always review your discharge instruction sheet given to you by the facility where your surgery was performed.  A  prescription for pain medication may be sent to your pharmacy on discharge.  Take your pain medication as prescribed.  If narcotic pain medicine is not needed, then you may take acetaminophen (Tylenol) or ibuprofen (Advil) as needed.  Take your usually prescribed medications unless otherwise directed.  If you need a refill on your pain medication, please contact your pharmacy.  They will contact our office to request authorization. Prescriptions will not be filled after 5:00 PM daily or on weekends.  You should follow a light diet the first 24 hours after arrival home, such as soup and crackers or toast.  Be sure to include plenty of fluids daily.  Resume your normal diet the day after surgery.  Most patients will experience some swelling and bruising around the surgical site.  Ice packs and reclining will help.  Swelling and bruising can take several days to resolve.   It is common to experience some constipation if taking pain medication after surgery.  Increasing fluid intake and taking a stool softener (such as Colace) will usually help or prevent this problem from occurring.  A mild laxative (Milk of Magnesia or Miralax) should be taken according to package directions if there is no bowel movement after 48 hours.  You will likely have Dermabond (topical glue) over your incisions.  This seals the incisions and allows you to bathe and shower at any time after your surgery.  Glue should remain in place for up to 10 days.  It may be removed after 10 days by pealing off the Dermabond material or using Vaseline or naval jelly to remove.  ACTIVITIES:  You may resume regular (light) daily activities beginning the next day - such as daily self-care, walking, climbing stairs - gradually increasing activities as tolerated.  You  may have sexual intercourse when it is comfortable.  Refrain from any heavy lifting or straining until approved by your doctor.  You may drive when you are no longer taking prescription pain medication, when you can comfortably wear a seatbelt, and when you can safely maneuver your car and apply the brakes.  You should see your doctor in the office for a follow-up appointment approximately 2-3 weeks after your surgery.  Make sure that you call for this appointment within a day or two after you arrive home to insure a convenient appointment time.  WHEN TO CALL YOUR DOCTOR: Fever greater than 101.0 Inability to urinate Persistent nausea and/or vomiting Extreme swelling or bruising Continued bleeding from incision Increased pain, redness, or drainage from the incision  The clinic staff is available to answer your questions during regular business hours.  Please don't hesitate to call and ask to speak to one of the nurses for clinical concerns.  If you have a medical emergency, go to the nearest emergency room or call 911.  A surgeon from [facility] is always on call for the hospital.   [facility] [address], [address], [place], [place]  [address]  [phone] ? [phone] ? FAX [phone] 

## 2021-03-22 NOTE — Transfer of Care (Signed)
Immediate Anesthesia Transfer of Care Note  Patient: Thomas Dominguez. Thomas Dominguez  Procedure(s) Performed: OPEN REPAIR LEFT INGUINAL HERNIA WITH MESH (Left: Groin)  Patient Location: PACU  Anesthesia Type:General  Level of Consciousness: awake, drowsy and patient cooperative  Airway & Oxygen Therapy: Patient Spontanous Breathing and Patient connected to face mask oxygen  Post-op Assessment: Report given to RN and Post -op Vital signs reviewed and stable  Post vital signs: Reviewed and stable  Last Vitals:  Vitals Value Taken Time  BP 161/95 03/22/21 1127  Temp    Pulse 85 03/22/21 1129  Resp 13 03/22/21 1129  SpO2 99 % 03/22/21 1129  Vitals shown include unvalidated device data.  Last Pain:  Vitals:   03/22/21 0821  TempSrc:   PainSc: 4       Patients Stated Pain Goal: 3 (25/27/12 9290)  Complications: No notable events documented.

## 2021-03-22 NOTE — Interval H&P Note (Signed)
History and Physical Interval Note:  03/22/2021 9:30 AM  Thomas Dominguez  has presented today for surgery, with the diagnosis of LEFT INGUINAL HERNIA, REDUCIBLE.  The various methods of treatment have been discussed with the patient and family. After consideration of risks, benefits and other options for treatment, the patient has consented to    Procedure(s): OPEN REPAIR LEFT INGUINAL HERNIA WITH MESH (Left) as a surgical intervention.    The patient's history has been reviewed, patient examined, no change in status, stable for surgery.  I have reviewed the patient's chart and labs.  Questions were answered to the patient's satisfaction.    [name], [facility] [place] practice Office: [place]

## 2021-03-23 ENCOUNTER — Encounter (HOSPITAL_COMMUNITY): Payer: Self-pay | Admitting: Surgery

## 2021-03-23 ENCOUNTER — Ambulatory Visit: Payer: Medicare HMO

## 2021-03-25 ENCOUNTER — Encounter: Payer: Self-pay | Admitting: Gastroenterology

## 2021-04-06 ENCOUNTER — Ambulatory Visit (INDEPENDENT_AMBULATORY_CARE_PROVIDER_SITE_OTHER): Payer: Medicare HMO | Admitting: Internal Medicine

## 2021-04-06 ENCOUNTER — Ambulatory Visit: Payer: Medicare HMO | Admitting: Internal Medicine

## 2021-04-06 ENCOUNTER — Encounter: Payer: Self-pay | Admitting: Internal Medicine

## 2021-04-06 ENCOUNTER — Other Ambulatory Visit: Payer: Self-pay

## 2021-04-06 VITALS — BP 110/60 | HR 90 | Temp 98.1°F | Ht 72.0 in | Wt 211.6 lb

## 2021-04-06 DIAGNOSIS — Z23 Encounter for immunization: Secondary | ICD-10-CM | POA: Diagnosis not present

## 2021-04-06 DIAGNOSIS — J690 Pneumonitis due to inhalation of food and vomit: Secondary | ICD-10-CM | POA: Diagnosis not present

## 2021-04-06 DIAGNOSIS — R0602 Shortness of breath: Secondary | ICD-10-CM

## 2021-04-06 LAB — PULMONARY FUNCTION TEST
DL/VA % pred: 89 %
DL/VA: 3.52 ml/min/mmHg/L
DLCO cor % pred: 66 %
DLCO cor: 17.66 ml/min/mmHg
DLCO unc % pred: 68 %
DLCO unc: 18.32 ml/min/mmHg
FEF 25-75 Post: 1.91 L/sec
FEF 25-75 Pre: 1.55 L/sec
FEF2575-%Change-Post: 22 %
FEF2575-%Pred-Post: 79 %
FEF2575-%Pred-Pre: 64 %
FEV1-%Change-Post: 5 %
FEV1-%Pred-Post: 69 %
FEV1-%Pred-Pre: 66 %
FEV1-Post: 2.32 L
FEV1-Pre: 2.2 L
FEV1FVC-%Change-Post: 3 %
FEV1FVC-%Pred-Pre: 100 %
FEV6-%Change-Post: 1 %
FEV6-%Pred-Post: 70 %
FEV6-%Pred-Pre: 69 %
FEV6-Post: 3.05 L
FEV6-Pre: 3 L
FEV6FVC-%Change-Post: 0 %
FEV6FVC-%Pred-Post: 105 %
FEV6FVC-%Pred-Pre: 106 %
FVC-%Change-Post: 2 %
FVC-%Pred-Post: 66 %
FVC-%Pred-Pre: 65 %
FVC-Post: 3.06 L
FVC-Pre: 3 L
Post FEV1/FVC ratio: 76 %
Post FEV6/FVC ratio: 100 %
Pre FEV1/FVC ratio: 73 %
Pre FEV6/FVC Ratio: 100 %
RV % pred: 91 %
RV: 2.43 L
TLC % pred: 72 %
TLC: 5.41 L

## 2021-04-06 NOTE — Progress Notes (Signed)
PFT done today. 

## 2021-04-06 NOTE — Progress Notes (Signed)
Thomas Dominguez    [id number]    07/29/1945  Primary Care Physician:Dahbura, Fenton Malling, DO Date of Appointment: 04/06/2021 Established Patient Visit  Chief complaint:   Chief Complaint  Patient presents with   Follow-up    PFT results    HPI: Thomas Dominguez. Stay is a 75 y.o. gentleman who presented initially for dyspnea evaluation and abnormal CT Chest felt to be secondary to chronic aspiration. 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 Sy,ptoms improved with pyloric channel dilation. Has recurrent bowel obstructions.   Interval Updates: Dyspnea has improved since coming off diltiazem - he was getting low blood pressure and feels symptoms of dyspnea may have been mixed in with fatigue.   He can walk further now up to 2 miles at a time without dyspnea. Not having episodes of aspiration.  Continues to sleep with a wedge pillow.   Doing well off inhaler therapy.  No fevers chills night sweats or weight loss.    I have reviewed the patient's family social and past medical history and updated as appropriate.   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[place])    Pneumonia 11/2019   SBO (small bowel obstruction) ([place]) 11/20/2019   Wears glasses     Past Surgical History:  Procedure Laterality Date   ABDOMINAL HERNIA REPAIR  2012, 2014, 2017    ventral   BRONCHIAL BIOPSY  04/08/2020   Procedure: BRONCHIAL BIOPSIES;  Surgeon: Marshell Garfinkel, MD;  Location: [facility];  Service: Cardiopulmonary;;   BRONCHIAL BRUSHINGS  04/08/2020   Procedure: BRONCHIAL BRUSHINGS;  Surgeon: Marshell Garfinkel, MD;  Location: [facility];  Service: Cardiopulmonary;;   BRONCHIAL WASHINGS  04/08/2020   Procedure: BRONCHIAL WASHINGS;  Surgeon: Marshell Garfinkel, MD;  Location: [facility];  Service: Cardiopulmonary;;   COLONOSCOPY  2019   ESOPHAGECTOMY  1997   for esophageal cancer   ESOPHAGOGASTRODUODENOSCOPY (EGD) WITH PROPOFOL N/A 06/11/2020   Procedure: ESOPHAGOGASTRODUODENOSCOPY (EGD) WITH PROPOFOL;  Surgeon: Mauri Pole, MD;  Location: [facility];  Service: Endoscopy;  Laterality: N/A;   INGUINAL HERNIA REPAIR Left 03/22/2021   Procedure: OPEN REPAIR LEFT INGUINAL HERNIA WITH MESH;  Surgeon: [name], MD;  Location: [facility];  Service: General;  Laterality: Left;   PARATHYROIDECTOMY Right 01/26/2020   Procedure: RIGHT INFERIOR PARATHYROIDECTOMY;  Surgeon: [name], MD;  Location: [facility];  Service: General;  Laterality: Right;   SPINAL FUSION  2018, 2020   L2 L5 in 2018, L5-S1 2020   VIDEO BRONCHOSCOPY N/A 04/08/2020   Procedure: VIDEO BRONCHOSCOPY WITH FLUORO;  Surgeon: Marshell Garfinkel, MD;  Location: [facility];  Service: Cardiopulmonary;  Laterality: N/A;    Family History  Problem Relation Age of Onset   Emphysema Father        smoker   GER disease Daughter    GER disease Son    GER disease  Mother    Lung cancer Sister        never smoked   Colon cancer Neg Hx    Stomach cancer Neg Hx     Social History   Occupational History   Occupation: retired    Fish farm manager: [employer]  Tobacco Use   Smoking status: Never   Smokeless tobacco: Never  Vaping Use   Vaping Use: Never used  Substance and Sexual Activity   Alcohol use: Not Currently    Alcohol/week: 7.0 standard drinks    Types: 7 Cans of beer per week     Comment: Stopped a year ago   Drug use: Never   Sexual activity: Not on file     Physical Exam: Blood pressure 110/60, pulse 90, temperature 98.1 F (36.7 C), temperature source Oral, height 6' (1.829 m), weight 211 lb 9.6 oz (96 kg), SpO2 93 %.  Gen:      No acute distress ENT: septum slightly deviated to the right. Nares patent. No polyps Lungs:    Ctab no wheezes or crackles CV:         RRR, no mrg   Data Reviewed: Imaging: I have personally reviewed the CT Chest from Sept 2022 which shows patchy peribronchovascular tree in bud and some areas of dense consolidation which are consistent with previous areas of aspiration pneumonia. Not significantly changed. Also discussed with radiology personally.   PFTs:   PFT Results Latest Ref Rng & Units 04/06/2021 12/12/2019  FVC-Pre L 3.00 3.06  FVC-Predicted Pre % 65 65  FVC-Post L 3.06 3.35  FVC-Predicted Post % 66 71  Pre FEV1/FVC % % 73 74  Post FEV1/FCV % % 76 78  FEV1-Pre L 2.20 2.25  FEV1-Predicted Pre % 66 66  FEV1-Post L 2.32 2.60  DLCO uncorrected ml/min/mmHg 18.32 20.05  DLCO UNC% % 68 74  DLCO corrected ml/min/mmHg 17.66 20.59  DLCO COR %Predicted % 66 76  DLVA Predicted % 89 98  TLC L 5.41 6.04  TLC % Predicted % 72 81  RV % Predicted % 91 102   I have personally reviewed the patient's PFTs performed today which show stable FVC.   Labs: HP panel negative Pathology reviewed from 04/08/2020 A. LUNG, RUL, BIOPSY:  - Benign unremarkable bronchiolar mucosa and lung parenchyma  - Negative for granulomas or increased eosinophils   B. LUNG, RML, BIOPSY:  - Benign lung parenchyma with a poorly formed nonnecrotizing granuloma  with multinucleated giant cells.  See comment   C. LUNG, RLL, BIOPSY:  - Benign bronchiolar mucosa and lung parenchyma with a multinucleated  giant cell.  See comment   Immunization status: Immunization History  Administered Date(s) Administered   Fluad Quad(high Dose 65+) 04/23/2019,  03/17/2020   Influenza Inj Mdck Quad Pf 07/04/2017, 06/11/2018   Influenza, Quadrivalent, Recombinant, Inj, Pf 04/21/2019   Influenza-Unspecified 04/28/2019, 02/10/2021   PFIZER(Purple Top)SARS-COV-2 Vaccination 11/26/2019, 12/17/2019, 06/09/2020, 02/10/2021   Pneumococcal Conjugate-13 03/22/2020   Pneumococcal Polysaccharide-23 04/06/2021   Tdap 03/22/2020    Assessment:  Dyspnea on Exertion, improved Chronic Aspiration, improved Atrial Fibrillation on Eliquis Need for pneumococcal vaccination.   Plan/Recommendations: Improved dyspnea. Doing well off inhaler therapy. Ok to abstain.  Would like to repeat lung function testing in 12 months to document stability. Would repeat imaging only if clinical change.  He will let us know if the symptoms of dyspnea worsen or change. Will give PCV 23 today.  Return to Care: Return in about 1 year (around 04/06/2022).  Teressa Senter  Shearon Stalls, MD [facility] and [place]

## 2021-04-06 NOTE — Patient Instructions (Signed)
Please schedule follow up scheduled with myself in 12 months.  If my schedule is not open yet, we will contact you with a reminder closer to that time.  Before your next visit I would like you to have:  Full set of PFTs

## 2021-04-07 ENCOUNTER — Ambulatory Visit (INDEPENDENT_AMBULATORY_CARE_PROVIDER_SITE_OTHER): Payer: Medicare HMO

## 2021-04-07 VITALS — Ht 72.0 in | Wt 211.0 lb

## 2021-04-07 DIAGNOSIS — Z0000 Encounter for general adult medical examination without abnormal findings: Secondary | ICD-10-CM

## 2021-04-07 NOTE — Progress Notes (Addendum)
I have reviewed this visit and agree with the documentation.   

## 2021-04-07 NOTE — Patient Instructions (Signed)
You spoke to Thomas Dominguez, [place] over the phone for your annual wellness visit.  We discussed goals:   Goals       DIET - DECREASE SODA OR JUICE INTAKE (pt-stated)      Decrease or eliminate orange and pineapple juice.  Replace with equivalent fruit.       Patient Stated      Maintain current level of physical activity.       We also discussed recommended health maintenance. Please call our office and schedule a visit. As discussed, you are due for: Health Maintenance  Topic Date Due   Zoster Vaccines- Shingrix (1 of 2) Never done   COVID-19 Vaccine (5 - Booster for Pfizer series) 06/12/2021   COLONOSCOPY (Pts 45-76yrs Insurance coverage will need to be confirmed)  10/13/2027   TETANUS/TDAP  03/22/2030   INFLUENZA VACCINE  Completed   Hepatitis C Screening  Completed   HPV VACCINES  Aged Out   Schedule annual exam with PCP.  You are eligible for Bivalent Booster on 10/18. Fill out an advance directive. Discuss Shingrix series with PCP. Preventive Care 75 Years and Older, Male Preventive care refers to lifestyle choices and visits with your health care provider that can promote health and wellness. This includes: A yearly physical exam. This is also called an annual wellness visit. Regular dental and eye exams. Immunizations. Screening for certain conditions. Healthy lifestyle choices, such as: Eating a healthy diet. Getting regular exercise. Not using drugs or products that contain nicotine and tobacco. Limiting alcohol use. What can I expect for my preventive care visit? Physical exam Your health care provider will check your: Height and weight. These may be used to calculate your BMI (body mass index). BMI is a measurement that tells if you are at a healthy weight. Heart rate and blood pressure. Body temperature. Skin for abnormal spots. Counseling Your health care provider may ask you questions about your: Past medical problems. Family's medical history. Alcohol,  tobacco, and drug use. Emotional well-being. Home life and relationship well-being. Sexual activity. Diet, exercise, and sleep habits. History of falls. Memory and ability to understand (cognition). Work and work Statistician. Access to firearms. What immunizations do I need? Vaccines are usually given at various ages, according to a schedule. Your health care provider will recommend vaccines for you based on your age, medical history, and lifestyle or other factors, such as travel or where you work. What tests do I need? Blood tests Lipid and cholesterol levels. These may be checked every 5 years, or more often depending on your overall health. Hepatitis C test. Hepatitis B test. Screening Lung cancer screening. You may have this screening every year starting at age 44 if you have a 30-pack-year history of smoking and currently smoke or have quit within the past 15 years. Colorectal cancer screening. All adults should have this screening starting at age 37 and continuing until age 77. Your health care provider may recommend screening at age 15 if you are at increased risk. You will have tests every 1-10 years, depending on your results and the type of screening test. Prostate cancer screening. Recommendations will vary depending on your family history and other risks. Genital exam to check for testicular cancer or hernias. Diabetes screening. This is done by checking your blood sugar (glucose) after you have not eaten for a while (fasting). You may have this done every 1-3 years. Abdominal aortic aneurysm (AAA) screening. You may need this if you are a current or  former smoker. STD (sexually transmitted disease) testing, if you are at risk. Follow these instructions at home: Eating and drinking  Eat a diet that includes fresh fruits and vegetables, whole grains, lean protein, and low-fat dairy products. Limit your intake of foods with high amounts of sugar, saturated fats, and  salt. Take vitamin and mineral supplements as recommended by your health care provider. Do not drink alcohol if your health care provider tells you not to drink. If you drink alcohol: Limit how much you have to 0-2 drinks a day. Be aware of how much alcohol is in your drink. In the [place] one drink equals one 12 oz bottle of beer (355 mL), one 5 oz glass of wine (148 mL), or one 1 oz glass of hard liquor (44 mL). Lifestyle Take daily care of your teeth and gums. Brush your teeth every morning and night with fluoride toothpaste. Floss one time each day. Stay active. Exercise for at least 30 minutes 5 or more days each week. Do not use any products that contain nicotine or tobacco, such as cigarettes, e-cigarettes, and chewing tobacco. If you need help quitting, ask your health care provider. Do not use drugs. If you are sexually active, practice safe sex. Use a condom or other form of protection to prevent STIs (sexually transmitted infections). Talk with your health care provider about taking a low-dose aspirin or statin. Find healthy ways to cope with stress, such as: Meditation, yoga, or listening to music. Journaling. Talking to a trusted person. Spending time with friends and family. Safety Always wear your seat belt while driving or riding in a vehicle. Do not drive: If you have been drinking alcohol. Do not ride with someone who has been drinking. When you are tired or distracted. While texting. Wear a helmet and other protective equipment during sports activities. If you have firearms in your house, make sure you follow all gun safety procedures. What's next? Visit your health care provider once a year for an annual wellness visit. Ask your health care provider how often you should have your eyes and teeth checked. Stay up to date on all vaccines. This information is not intended to replace advice given to you by your health care provider. Make sure you discuss any questions you  have with your health care provider. Document Revised: 08/20/2020 Document Reviewed: 06/06/2018 Elsevier Patient Education  2022 [employer].   Our clinic's number is [phone]. Please call with questions or concerns about what we discussed today.

## 2021-04-07 NOTE — Progress Notes (Signed)
Subjective:   Thomas Dominguez is a 75 y.o. male who presents for Medicare Annual/Subsequent preventive examination.  The patient consented to a virtual visit. Patient consented to have virtual visit and was identified by name and date of birth. Method of visit: Telephone  Encounter participants: Patient: Thomas Dominguez. Be[place] - located at [facility] Nurse/Provider: Dorna Bloom - located at Home Others (if applicable): NA  Review of Systems: Defer to PCP   Objective:    Vitals: Ht 6' (1.829 m)   Wt 211 lb (95.7 kg)   BMI 28.62 kg/m   Body mass index is 28.62 kg/m.  Advanced Directives 04/07/2021 03/16/2021 01/02/2021 06/11/2020 05/07/2020 04/08/2020 03/22/2020  Does Patient Have a Medical Advance Directive? No No No No No No No  Would patient like information on creating a medical advance directive? Yes (MAU/Ambulatory/Procedural Areas - Information given) No - Patient declined No - Patient declined Yes (Inpatient - patient defers creating a medical advance directive and declines information at this time) No - Patient declined No - Patient declined No - Patient declined   Tobacco Social History   Tobacco Use  Smoking Status Never  Smokeless Tobacco Never     Clinical Intake:  Pre-visit preparation completed: Yes  Pain Score: 0-No pain  How often do you need to have someone help you when you read instructions, pamphlets, or other written materials from your doctor or pharmacy?: 1 - Never What is the last grade level you completed in school?: College  Past Medical History:  Diagnosis Date   Atrial fibrillation ([place]) 01/17/2019   BPH (benign prostatic hyperplasia) 01/17/2019   Chronic pulmonary aspiration    Degenerative disc disease, lumbar 01/17/2019   Depression, major, single episode, complete remission ([place]) 01/17/2019   Diverticulosis 01/17/2019   Dysrhythmia    Esophageal cancer ([place])    GERD (gastroesophageal reflux disease)    H/O small bowel obstruction 01/17/2019    Hiatal hernia 01/17/2019   Hypertension    ILD (interstitial lung disease) ([place])    Inguinal hernia    bilateral, left more prominant   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[place])    Pneumonia 11/2019   SBO (small bowel obstruction) ([place]) 11/20/2019   Wears glasses    Past Surgical History:  Procedure Laterality Date   ABDOMINAL HERNIA REPAIR  2012, 2014, 2017   ventral   BRONCHIAL BIOPSY  04/08/2020   Procedure: BRONCHIAL BIOPSIES;  Surgeon: Marshell Garfinkel, MD;  [facility];  Service: Cardiopulmonary;;   BRONCHIAL BRUSHINGS  04/08/2020   Procedure: BRONCHIAL BRUSHINGS;  Surgeon: Marshell Garfinkel, MD;  [facility];  Service: Cardiopulmonary;;   BRONCHIAL WASHINGS  04/08/2020   Procedure: BRONCHIAL WASHINGS;  Surgeon: Marshell Garfinkel, MD;  [facility];  Service: Cardiopulmonary;;   COLONOSCOPY  2019   ESOPHAGECTOMY  1997   for esophageal cancer   ESOPHAGOGASTRODUODENOSCOPY (EGD) WITH PROPOFOL N/A 06/11/2020   Procedure: ESOPHAGOGASTRODUODENOSCOPY (EGD) WITH PROPOFOL;  Surgeon: Mauri Pole, MD;  [facility];  Service: Endoscopy;  Laterality: N/A;   INGUINAL HERNIA REPAIR Left 03/22/2021   Procedure: OPEN REPAIR LEFT INGUINAL HERNIA WITH MESH;  Surgeon: [name], MD;  [facility];  Service: General;  Laterality: Left;   PARATHYROIDECTOMY Right 01/26/2020   Procedure: RIGHT INFERIOR PARATHYROIDECTOMY;  Surgeon: [name], MD;  [facility];  Service: General;  Laterality: Right;   SPINAL FUSION  2018, 2020   L2 L5 in 2018, L5-S1 2020   VIDEO BRONCHOSCOPY N/A 04/08/2020   Procedure: VIDEO BRONCHOSCOPY  WITH FLUORO;  Surgeon: Marshell Garfinkel, MD;  [facility];  Service: Cardiopulmonary;  Laterality: N/A;   Family History  Problem Relation Age of Onset   Emphysema Father        smoker   GER disease Daughter    GER disease Son    GER disease Mother    Lung cancer Sister        never smoked   Colon cancer Neg Hx    Stomach  cancer Neg Hx    Social History   Socioeconomic History   Marital status: Married    Spouse name: [name]   Number of children: 2   Years of education: 16   Highest education level: Bachelor's degree (e.g., BA, AB, BS)  Occupational History   Occupation: retired    Fish farm manager: [employer]  Tobacco Use   Smoking status: Never   Smokeless tobacco: Never  Vaping Use   Vaping Use: Never used  Substance and Sexual Activity   Alcohol use: Not Currently    Alcohol/week: 7.0 standard drinks    Types: 7 Cans of beer per week    Comment: Stopped a year ago   Drug use: Never   Sexual activity: Yes  Other Topics Concern   Not on file  Social History Narrative   Patient lives with his wife in [place].    Patient has one son in [place] and one daughter in [place].    Patient walks daily at local parks/trails around [place].    Patient has one dog.    Social Determinants of Health   Financial Resource Strain: Low Risk    Difficulty of Paying Living Expenses: Not hard at all  Food Insecurity: No Food Insecurity   Worried About Charity fundraiser in the Last Year: Never true   [place] in the Last Year: Never true  Transportation Needs: No Transportation Needs   Lack of Transportation (Medical): No   Lack of Transportation (Non-Medical): No  Physical Activity: Sufficiently Active   Days of Exercise per Week: 6 days   Minutes of Exercise per Session: 40 min  Stress: No Stress Concern Present   Feeling of Stress : Not at all  Social Connections: Socially Integrated   Frequency of Communication with Friends and Family: More than three times a week   Frequency of Social Gatherings with Friends and Family: More than three times a week   Attends Religious Services: More than 4 times per year   Active Member of [employer] or Organizations: Yes   Attends Archivist Meetings: More than 4 times per year   Marital Status: Married   Outpatient Encounter Medications as of 04/07/2021   Medication Sig   acetaminophen (TYLENOL) 325 MG tablet Take 650 mg by mouth every 6 (six) hours as needed for moderate pain.   apixaban (ELIQUIS) 5 MG TABS tablet Take 5 mg by mouth 2 (two) times daily.   famotidine (PEPCID) 20 MG tablet Take 1 tablet (20 mg total) by mouth at bedtime. (Patient taking differently: Take 20 mg by mouth daily as needed for heartburn or indigestion.)   Hyoscyamine Sulfate SL (LEVSIN/SL) 0.125 MG SUBL Place 1 tablet under the tongue 2 (two) times daily as needed.   methocarbamol (ROBAXIN) 750 MG tablet TAKE 1 TABLET TWICE DAILY AS NEEDED FOR MUSCLE SPASM(S)   metoprolol succinate (TOPROL XL) 25 MG 24 hr tablet Take 1 tablet (25 mg total) by mouth daily.   Multiple Vitamins-Minerals (CENTRUM SILVER 50+MEN)  TABS Take 1 tablet by mouth daily.   omeprazole (PRILOSEC) 40 MG capsule Take 1 capsule (40 mg total) by mouth in the morning and at bedtime.   ondansetron (ZOFRAN) 4 MG tablet Take 1 tablet (4 mg total) by mouth every 8 (eight) hours as needed for nausea or vomiting.   propafenone (RYTHMOL) 225 MG tablet TAKE 1 TABLET EVERY 8 HOURS   tamsulosin (FLOMAX) 0.4 MG CAPS capsule Take 1 capsule (0.4 mg total) by mouth in the morning and at bedtime.   traMADol (ULTRAM) 50 MG tablet TAKE 1 TABLET BY MOUTH EVERY 12 HOURS AS NEEDED FOR MODERATE PAIN.   venlafaxine XR (EFFEXOR-XR) 150 MG 24 hr capsule TAKE 1 CAPSULE EVERY DAY WITH BREAKFAST   albuterol (VENTOLIN HFA) 108 (90 Base) MCG/ACT inhaler Inhale 2 puffs into the lungs every 6 (six) hours as needed. (Patient not taking: Reported on 04/07/2021)   ondansetron (ZOFRAN ODT) 4 MG disintegrating tablet Take 1 tablet (4 mg total) by mouth daily as needed for nausea or vomiting. (Patient not taking: Reported on 04/07/2021)   traMADol (ULTRAM) 50 MG tablet Take 1-2 tablets (50-100 mg total) by mouth every 6 (six) hours as needed for moderate pain. (Patient not taking: No sig reported)   Facility-Administered Encounter Medications  as of 04/07/2021  Medication   bupivacaine liposome (EXPAREL) 1.3 % injection 266 mg   Activities of Daily Living In your present state of health, do you have any difficulty performing the following activities: 04/07/2021 03/16/2021  Hearing? Y N  Comment does have hearing aides -  Vision? N N  Difficulty concentrating or making decisions? N N  Walking or climbing stairs? N N  Comment - -  Dressing or bathing? N N  Doing errands, shopping? N N  Preparing Food and eating ? N -  Using the Toilet? N -  In the past six months, have you accidently leaked urine? N -  Do you have problems with loss of bowel control? N -  Managing your Medications? N -  Managing your Finances? N -  Housekeeping or managing your Housekeeping? N -  Some recent data might be hidden   Patient Care Team: Wells Guiles, DO as PCP - General (Family Medicine) Donato Heinz, MD as PCP - Cardiology (Cardiology) Spero Geralds, MD as Consulting Physician (Pulmonary Disease)   Assessment:   This is a routine wellness examination for [place].  Exercise Activities and Dietary recommendations Current Exercise Habits: Home exercise routine, Type of exercise: walking, Time (Minutes): 45, Frequency (Times/Week): 6, Weekly Exercise (Minutes/Week): 270, Exercise limited by: None identified   Goals       DIET - DECREASE SODA OR JUICE INTAKE (pt-stated)      Decrease or eliminate orange and pineapple juice.  Replace with equivalent fruit.       Patient Stated      Maintain current level of physical activity.       Fall Risk Fall Risk  04/07/2021 11/28/2019 09/30/2019 09/02/2019 01/15/2019  Falls in the past year? 0 0 1 0 0  Number falls in past yr: - 0 1 - -  Injury with Fall? - - 0 - -  Risk for fall due to : No Fall Risks - - - -  Follow up Falls prevention discussed Falls evaluation completed Follow up appointment - -  Comment - - MD informed - -   Patient reports normal gait and balance. Patient denies  using any assistive devices to ambulate.  Is the patient's home free of loose throw rugs in walkways, pet beds, electrical cords, etc?   yes      Grab bars in the bathroom? yes      Handrails on the stairs?   yes      Adequate lighting?   yes  Patient rating of health (0-10): 8   Depression Screen PHQ 2/9 Scores 04/07/2021 03/22/2020 11/28/2019 09/30/2019  PHQ - 2 Score 0 0 0 1  PHQ- 9 Score - 5 - -   Cognitive Function  6CIT Screen 04/07/2021  What Year? 0 points  What month? 0 points  What time? 0 points  Count back from 20 0 points  Months in reverse 0 points  Repeat phrase 0 points  Total Score 0   Immunization History  Administered Date(s) Administered   Fluad Quad(high Dose 65+) 04/23/2019, 03/17/2020   Influenza Inj Mdck Quad Pf 07/04/2017, 06/11/2018   Influenza, Quadrivalent, Recombinant, Inj, Pf 04/21/2019   Influenza-Unspecified 04/28/2019, 02/10/2021   PFIZER(Purple Top)SARS-COV-2 Vaccination 11/26/2019, 12/17/2019, 06/09/2020, 02/10/2021   Pneumococcal Conjugate-13 03/22/2020   Pneumococcal Polysaccharide-23 04/06/2021   Tdap 03/22/2020   Qualifies for Shingles Vaccine? Yes   Zostavax completed Yes-"years ago." Shingrix Completed?: No. Education has been provided regarding the importance of this vaccine. Patient has been advised to call insurance company to determine out of pocket expense if they have not yet received this vaccine. Advised may also receive vaccine at local pharmacy or Health Dept. Verbalized acceptance and understanding. Will plan to discuss with PCP at next office visit.   Screening Tests Health Maintenance  Topic Date Due   Zoster Vaccines- Shingrix (1 of 2) Never done   COVID-19 Vaccine (5 - Booster for Pfizer series) 06/12/2021   COLONOSCOPY (Pts 45-63yrs Insurance coverage will need to be confirmed)  10/13/2027   TETANUS/TDAP  03/22/2030   INFLUENZA VACCINE  Completed   Hepatitis C Screening  Completed   HPV VACCINES  Aged Out    Cancer Screenings: Lung: Low Dose CT Chest recommended if Age 24-80 years, 30 pack-year currently smoking OR have quit w/in 15years. Patient does not qualify. Colorectal: UTD  Additional Screenings: Hepatitis C Screening: Completed   Plan:  Schedule annual exam with PCP.  You are eligible for Bivalent Booster on 10/18. Fill out an advance directive. Discuss Shingrix series with PCP.  I have personally reviewed and noted the following in the patient's chart:   Medical and social history Use of alcohol, tobacco or illicit drugs  Current medications and supplements Functional ability and status Nutritional status Physical activity Advanced directives List of other physicians Hospitalizations, surgeries, and ER visits in previous 12 months Vitals Screenings to include cognitive, depression, and falls Referrals and appointments  In addition, I have reviewed and discussed with patient certain preventive protocols, quality metrics, and best practice recommendations. A written personalized care plan for preventive services as well as general preventive health recommendations were provided to patient.  This visit was conducted virtually in the setting of the [place] pandemic.    Dorna Bloom, [place]  04/07/2021

## 2021-04-15 DIAGNOSIS — Z8719 Personal history of other diseases of the digestive system: Secondary | ICD-10-CM

## 2021-04-15 HISTORY — DX: Personal history of other diseases of the digestive system: Z87.19

## 2021-05-05 ENCOUNTER — Encounter: Payer: Self-pay | Admitting: Family Medicine

## 2021-05-05 ENCOUNTER — Ambulatory Visit (INDEPENDENT_AMBULATORY_CARE_PROVIDER_SITE_OTHER): Payer: Medicare HMO | Admitting: Family Medicine

## 2021-05-05 ENCOUNTER — Other Ambulatory Visit: Payer: Self-pay

## 2021-05-05 DIAGNOSIS — M5136 Other intervertebral disc degeneration, lumbar region: Secondary | ICD-10-CM

## 2021-05-05 DIAGNOSIS — F339 Major depressive disorder, recurrent, unspecified: Secondary | ICD-10-CM | POA: Diagnosis not present

## 2021-05-05 DIAGNOSIS — F39 Unspecified mood [affective] disorder: Secondary | ICD-10-CM | POA: Diagnosis not present

## 2021-05-05 DIAGNOSIS — I480 Paroxysmal atrial fibrillation: Secondary | ICD-10-CM | POA: Diagnosis not present

## 2021-05-05 MED ORDER — VENLAFAXINE HCL ER 75 MG PO CP24: ORAL_CAPSULE | ORAL | 0 refills | Status: DC

## 2021-05-05 NOTE — Progress Notes (Signed)
    SUBJECTIVE:   CHIEF COMPLAINT / HPI:   Thomas Dominguez (MRN: [id number]) is a 75 y.o. male with a history of atrial fibrillation, acid reflux, BPH, depression, and HLD who presents to discuss depressive symptoms.  Depression Patient feels that recently on his walks with his dogs, he has noticed it is harder for him to breath like he used to. He sees cardiology and pulmonology regularly, and he was diagnosed with ILD in 2021. He feels sad that he cannot do the things he used to before ILD. He also feels bad that he has gained weight secondary to not being able to exercise as much. He notes that most days, he does not want to get out of bed and feels tired, even after 10 hours of sleep. He feels he has a good support system in his wife, and he tells her everything. He also has found less joy in getting together with friends and family, though he moved to [place] from [place] to be with his grandchildren. He denies SI. Overall, he wants to not feel these feelings anymore and is open to different treatment options. He takes his Effexor 150 mg daily, and he has been taking this dose "for years." He has also seen a friend who was a therapist years ago, but he has never seen a formal therapist.  OBJECTIVE:   Left arm BP 95/72, Right arm BP 118/75   Pulse 72   Ht 6' (1.829 m)   Wt 207 lb 12.8 oz (94.3 kg)   SpO2 100%   BMI 28.18 kg/m    PHYSICAL EXAM  GEN: Well-developed, in NAD HEAD: NCAT CVS: Regular rate, irregularly irregular rhythm, normal S1/S2, no murmurs, rubs, gallops, no supraclavicular bruits on left RESP: Breathing comfortably on RA, no retractions, wheezes, rhonchi, or crackles, decreased lung sounds in right lower lobe ABD: Non-distended PSY: Mood and affect normal, thought process and content normal EXT: Moves all extremities grossly equally    ASSESSMENT/PLAN:   Depression, recurrent ([place]) Patient's symptoms, in the context of his multiple health comorbidities, are  understandable signs of depression. His support system and emotional intelligence are reassuring factors. Discussed increasing Effexor dose to 225 mg (one 150 mg capsule and one 75 mg capsule), and patient is amenable. He would not like a therapist referral at this time, but he would like some community resources to think about. Will follow up in 4 weeks to assess symptoms or sooner if additional concerns arise.  Upper extremity blood pressure discrepancy Patient's BP differs between arms. Patient is asymptomatic without claudication. 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 Given benign exam without audible bruits, will observe for now. If symptoms develop, consider doppler ultrasound for further evaluation.   [place]

## 2021-05-05 NOTE — Assessment & Plan Note (Addendum)
Patient's symptoms, in the context of his multiple health comorbidities, are understandable signs of depression. His support system and emotional intelligence are reassuring factors. Discussed increasing Effexor dose to 225 mg (one 150 mg capsule and one 75 mg capsule), and patient is amenable. He would not like a therapist referral at this time, but he would like some community resources to think about. Will follow up in 4 weeks to assess symptoms or sooner if additional concerns arise.

## 2021-05-05 NOTE — Patient Instructions (Signed)
It was great to meet you today!  Here's what we talked about:  We have sent in a prescription for an additional capsule of Effexor for 75 mg. Start taking one 150 mg capsule as you have been with one 75 mg capsule for a total daily dose of 225 mg. This dose can help some feelings you have been having recently. We will follow up in 4 weeks to see how you are feeling. Please take your pulse oximeter with you to measure your oxygen when you are walking so we can monitor these levels at your next visit. We have provided some therapy resources below for you to think about should you want to access these resources.  Take care and seek immediate care sooner if you develop any concerns.  Thomas Dominguez, [place]       Therapy and Counseling Resources Most providers on this list will take Medicaid. Patients with commercial insurance or Medicare should contact their insurance company to get a list of in network providers.   [place] [address] [place], [place] [address]       [phone]      [facility] [address], [place], [place]     Jinny Blossom [facility] [address] [place], [place]   [facility]:  [name]. [place]   [facility]:  [place] [address] Loni Muse, [place]   [facility] (under & uninsured) [address] [place] [phone]    [email or web address] [place]     Phone:  [phone]     [place]  [place] [address]      [place], [place] ext [place]: [place], [place], [place]    [place] ([place] therapist) [address]  [place] [address]   [place] [address]    [phone]     [facility]   [place],  [place], [place]    [place]  [place]   [facility]  [address] [place], [place]        [phone]   Open Access/Walk In Clinic under & uninsured [place],  [address] [place] [phone]):  Mon - Fri from 8 AM - 3 PM   [facility] the [place],  ([place])   [place], [place]: [phone]) 8:30 - 12; 1 - 2:30   Family Service of the [place],  [place], [place]    [phone]):8:30 - 12; 2 - 3PM   [facility] [employer],  [address]  [place]; [phone]):   Mon - Fri 8 AM - 5 PM   [facility]  MWF 12:30 to 3:00 or call to schedule an appointment  [phone]   Specific Provider options Psychology Today  [email or web address] click on find a therapist  enter your zip code left side and select or tailor a therapist for your specific need.    [facility] Provider Directory [email or web address]  (Medicaid)   Follow all drop down  to find a provider   [place] or [email or web address], [place], [place] Recovery support and educational    In home counseling [place] Telephone: [phone]  office in [facility] [email or web address]   Does not take reg. Medicaid or Medicare private insurance [place], [place] health Choice, [place], [place], [place], [place], [place] Availability:  [place] or [phone]   [facility] Service of the [facility] [place] [phone]   [facility] Crisis Service  [phone]    [facility]  [phone] (after hours)   Therapeutic  Alternative/Mobile Crisis   [phone]   [place] National Suicide Hotline  [phone] Diamantina Monks)   Call 911 or go to emergency room   [facility]  [phone]);  [place] and [employer]  [phone]); [place], [place], [place], [place], [place], [place], [place]

## 2021-05-06 ENCOUNTER — Encounter: Payer: Self-pay | Admitting: Family Medicine

## 2021-05-06 DIAGNOSIS — F39 Unspecified mood [affective] disorder: Secondary | ICD-10-CM | POA: Insufficient documentation

## 2021-05-06 MED ORDER — TRAMADOL HCL 50 MG PO TABS: 50.0000 mg | ORAL_TABLET | Freq: Two times a day (BID) | ORAL | 2 refills | Status: DC | PRN

## 2021-05-06 NOTE — Assessment & Plan Note (Signed)
Established problem Well Controlled. No signs of complications, medication side effects, or red flags. Continue current medications and other regiments.  

## 2021-05-06 NOTE — Progress Notes (Signed)
I have interviewed and examined the patient with MS Mabe.  I agree with their documentation and management in their note for today.   We refilled Thomas Dominguez's tramadol for his chronic OA pain.   I recommended he think about coming off the methacarbamol since it is on the [employer] List of potentially inappropriate medications for older adults.  It has a significant anticholinergic effect.  He has a 20 mmHg difference in SBP between his left and right arm.  Right arm greater than left.  Recommend measurements of BP are taken in right arm.                 For questions or updates, please contact Family Medicine Teaching Service Resident On-call at pager [phone] or  [email or web address] - see pager "[place]" ' CPT E&M Office Visit Time Before Visit; reviewing medical records (e.g. recent visits, labs, studies): 10 minutes During Visit (F2F time): 15 minutes After Visit (discussion with family or HCP, prescribing, ordering, referring, calling result/recommendations or documenting on same day): 10 minutes Total Visit Time: 35 minutes

## 2021-05-06 NOTE — Assessment & Plan Note (Addendum)
Established problem Uncertain if decreased mood is secondary to the emotional response to thefunctional limitations from his ILD or represents an mood disorder control decline. Thomas Dominguez will check his pulse ox when ambulating to see if his DOE from ILD involves exertional hypoxia.   RETURN TO CLINIC 4 weeks to assess response  Plan Increase venlafaxine XR to 225 daily.

## 2021-06-01 ENCOUNTER — Other Ambulatory Visit: Payer: Self-pay | Admitting: Gastroenterology

## 2021-06-09 NOTE — Progress Notes (Signed)
°  SUBJECTIVE:   CHIEF COMPLAINT / HPI:   Mood disorder: Patient was increased to venlafaxine XR 225 mg daily at last visit.  This was changed due to patient having further complications from his ILD.  Patient states that he is happy with the increased dose and he feels back to his baseline.  He has not considered counseling resources but is going to consider them should his mood decrease again.  He notes that he still has the counseling resources paper at home should he need it.  PERTINENT  PMH / PSH: A. fib, ILD, GERD, BPH, depression, HLD  OBJECTIVE:  BP 123/68    Pulse (!) 101    Ht 6\' 1"  (1.854 m)    Wt 210 lb 9.6 oz (95.5 kg)    SpO2 98%    BMI 27.79 kg/m    General: NAD, pleasant, able to participate in exam Cardiac: RRR, no murmurs. Respiratory: CTAB, wheezing in the left lower lobe, decreased air movement in right lower lobe Psych: Normal affect and mood  ASSESSMENT/PLAN:  Mood disorder (HCC) Improved with Effexor 225 mg daily.  PHQ-9 score 4.  Reinforced suggestion of counseling and medication synergistic effect.  Patient plans to pursue counseling if mood worsens in the future.  Hyperlipidemia Discussed lipid panel with patient.  I do not feel this is necessary given statin intolerance and diet-controlled in correlation with his age of 81. Last lipid panel attached below.  Lipid Panel     Component Value Date/Time   CHOL 186 12/26/2019 0946   TRIG 176 (H) 12/26/2019 0946   HDL 67 12/26/2019 0946   CHOLHDL 2.8 12/26/2019 0946   LDLCALC 89 12/26/2019 0946   LABVLDL 30 12/26/2019 0946   Return in about 1 year (around 06/10/2022) for annual wellness visit.  Wells Guiles, DO 06/10/2021, 4:32 PM PGY-1, [place]

## 2021-06-10 ENCOUNTER — Other Ambulatory Visit: Payer: Self-pay

## 2021-06-10 ENCOUNTER — Encounter: Payer: Self-pay | Admitting: Student

## 2021-06-10 ENCOUNTER — Ambulatory Visit (INDEPENDENT_AMBULATORY_CARE_PROVIDER_SITE_OTHER): Payer: Medicare HMO | Admitting: Student

## 2021-06-10 VITALS — BP 123/68 | HR 101 | Ht 73.0 in | Wt 210.6 lb

## 2021-06-10 DIAGNOSIS — E785 Hyperlipidemia, unspecified: Secondary | ICD-10-CM | POA: Diagnosis not present

## 2021-06-10 DIAGNOSIS — F39 Unspecified mood [affective] disorder: Secondary | ICD-10-CM | POA: Diagnosis not present

## 2021-06-10 NOTE — Assessment & Plan Note (Signed)
Improved with Effexor 225 mg daily.  PHQ-9 score 4.  Reinforced suggestion of counseling and medication synergistic effect.  Patient plans to pursue counseling if mood worsens in the future.

## 2021-06-10 NOTE — Assessment & Plan Note (Signed)
Discussed lipid panel with patient.  I do not feel this is necessary given statin intolerance and diet-controlled in correlation with his age of 31. Last lipid panel attached below.  Lipid Panel     Component Value Date/Time   CHOL 186 12/26/2019 0946   TRIG 176 (H) 12/26/2019 0946   HDL 67 12/26/2019 0946   CHOLHDL 2.8 12/26/2019 0946   LDLCALC 89 12/26/2019 0946   LABVLDL 30 12/26/2019 0946

## 2021-06-10 NOTE — Patient Instructions (Signed)
It was great to see you today! Thank you for choosing [facility] for your primary care. Thomas Dominguez was seen for mood disorder.  Our plans for today were:  -I am glad to see that you are increase in Effexor is proving to be beneficial.  If you have any worsening of your mood symptoms, please consider the counseling resources list that was provided to you at the last visit. -Should you ever have interest in another COVID booster and that is something that we can offer here.  You should return to our clinic in 1 year for annual wellness visit.   I recommend that you always bring your medications to each appointment as this makes it easy to ensure you are on the correct medications and helps [place] not miss refills when you need them.  Please arrive 15 minutes before your appointment to ensure smooth check in process.  We appreciate your efforts in making this happen.  Take care and seek immediate care sooner if you develop any concerns.   Thank you for allowing me to participate in your care, Wells Guiles, DO 06/10/2021, 3:11 PM PGY-1, [place]

## 2021-06-29 ENCOUNTER — Encounter (INDEPENDENT_AMBULATORY_CARE_PROVIDER_SITE_OTHER): Payer: Self-pay

## 2021-07-12 ENCOUNTER — Other Ambulatory Visit: Payer: Self-pay | Admitting: Family Medicine

## 2021-07-12 DIAGNOSIS — F339 Major depressive disorder, recurrent, unspecified: Secondary | ICD-10-CM

## 2021-07-21 DIAGNOSIS — D0361 Melanoma in situ of right upper limb, including shoulder: Secondary | ICD-10-CM | POA: Diagnosis not present

## 2021-07-21 DIAGNOSIS — L821 Other seborrheic keratosis: Secondary | ICD-10-CM | POA: Diagnosis not present

## 2021-07-21 DIAGNOSIS — D1801 Hemangioma of skin and subcutaneous tissue: Secondary | ICD-10-CM | POA: Diagnosis not present

## 2021-07-21 DIAGNOSIS — D485 Neoplasm of uncertain behavior of skin: Secondary | ICD-10-CM | POA: Diagnosis not present

## 2021-07-27 ENCOUNTER — Other Ambulatory Visit: Payer: Self-pay | Admitting: Family Medicine

## 2021-07-27 DIAGNOSIS — H43813 Vitreous degeneration, bilateral: Secondary | ICD-10-CM | POA: Diagnosis not present

## 2021-07-27 DIAGNOSIS — H2513 Age-related nuclear cataract, bilateral: Secondary | ICD-10-CM | POA: Diagnosis not present

## 2021-07-27 DIAGNOSIS — F339 Major depressive disorder, recurrent, unspecified: Secondary | ICD-10-CM

## 2021-07-28 ENCOUNTER — Encounter: Payer: Self-pay | Admitting: Cardiology

## 2021-07-31 NOTE — Progress Notes (Signed)
Cardiology Office Note:    Date:  08/04/2021   ID:  Thomas Dominguez, [place] [birth year], MRN [id number]  PCP:  Wells Guiles, DO  Cardiologist:  Donato Heinz, MD  Electrophysiologist:  None   Referring MD: Wells Guiles, DO   Chief complaint: AF  History of Present Illness:    Thomas Dominguez. [name] is a 76 y.o. male with a hx of atrial fibrillation, esophageal cancer status post esophagectomy, BPH, depression, degenerative disc disease who presents for follow-up.  He was referred by Dr. Owens Shark for evaluation of atrial fibrillation, initially seen on 03/18/2019.  He reports that he was diagnosed with atrial fibrillation in 2013.  He was previously seeing a cardiologist in [place], [place].  He had been alternating stress test and echo every other year.  Has been on propafenone and diltiazem since 2013.  States that he always feels it when he goes into AF.  Will occur every 3-4 days, can last up to 1 hour.  Feels short of breath when in AF.  Had back surgery in June 2020, has been improving.  States that walks 1.5 miles per day.  Has been having dyspnea with exertion but able to complete his walks.  He reports that he checks his BP at home, has always been normal.   TTE 04/02/2019 showed EF 55 to 42%, grade 2 diastolic dysfunction, normal RV function, no significant valvular disease.  14-day Zio patch showed no recurrent atrial fibrillation but did show 20 episodes of SVT, longest lasting 11 seconds.  Triggered events corresponded to sinus rhythm and rare PVCs.  Calcium score 0 on 03/12/2020.  Underwent bronchospy in October 3536 which was complicated by pneumothorax.  Biopsy was negative. 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  He was admitted 7/10 through 01/04/2021 with small bowel obstruction.  He has had 5 prior episodes of small bowel obstruction, thought to be due to from prior scar tissue and resolves with NG tube  decompression.  Since last clinic visit, he reports that he has been doing okay.  Denies any recent symptoms of atrial fibrillation.  Denies any chest pain, lightheadedness, syncope, or palpitations.  Does report chronic dyspnea, that he attributes to his ILD.  Has been taking Eliquis, denies any bleeding issues.    Past Medical History:  Diagnosis Date   Acute ataxia 09/30/2019   Acute pneumothorax 04/08/2020   Atrial fibrillation (HCC) 01/17/2019   BPH (benign prostatic hyperplasia) 01/17/2019   Chronic pulmonary aspiration    Degenerative disc disease, lumbar 01/17/2019   Depression, major, single episode, complete remission ([place]) 01/17/2019   Displacement of lumbar intervertebral disc without myelopathy 04/22/2010   Diverticulosis 01/17/2019   Dysrhythmia    Dysrhythmias 12/19/2011   Esophageal cancer (HCC)    GERD (gastroesophageal reflux disease)    H/O small bowel obstruction 01/17/2019   Hiatal hernia 01/17/2019   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 11/22/2019   Hypertension    ILD (interstitial lung disease) ([place])    Inguinal hernia    bilateral, left more prominant   Left inguinal hernia 03/21/2021   Leg swelling 01/17/2019   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[place])    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   Pneumonia 11/2019   Pneumothorax 04/08/2020   SBO (small bowel obstruction) ([place]) 11/20/2019   Status post left inguinal hernia repair 04/15/2021   Wears glasses     Past Surgical History:  Procedure  Laterality Date   ABDOMINAL HERNIA REPAIR  2012, 2014, 2017   ventral   BRONCHIAL BIOPSY  04/08/2020   Procedure: BRONCHIAL BIOPSIES;  Surgeon: Marshell Garfinkel, MD;  Location: [facility];  Service: Cardiopulmonary;;   BRONCHIAL BRUSHINGS  04/08/2020   Procedure: BRONCHIAL BRUSHINGS;  Surgeon: Marshell Garfinkel, MD;  Location: [facility];  Service: Cardiopulmonary;;   BRONCHIAL WASHINGS  04/08/2020   Procedure: BRONCHIAL WASHINGS;   Surgeon: Marshell Garfinkel, MD;  Location: [facility];  Service: Cardiopulmonary;;   COLONOSCOPY  2019   ESOPHAGECTOMY  1997   for esophageal cancer   ESOPHAGOGASTRODUODENOSCOPY (EGD) WITH PROPOFOL N/A 06/11/2020   Procedure: ESOPHAGOGASTRODUODENOSCOPY (EGD) WITH PROPOFOL;  Surgeon: Mauri Pole, MD;  Location: [facility];  Service: Endoscopy;  Laterality: N/A;   INGUINAL HERNIA REPAIR Left 03/22/2021   Procedure: OPEN REPAIR LEFT INGUINAL HERNIA WITH MESH;  Surgeon: [name], MD;  Location: [facility];  Service: General;  Laterality: Left;   PARATHYROIDECTOMY Right 01/26/2020   Procedure: RIGHT INFERIOR PARATHYROIDECTOMY;  Surgeon: [name], MD;  Location: [facility];  Service: General;  Laterality: Right;   SPINAL FUSION  2018, 2020   L2 L5 in 2018, L5-S1 2020   VIDEO BRONCHOSCOPY N/A 04/08/2020   Procedure: VIDEO BRONCHOSCOPY WITH FLUORO;  Surgeon: Marshell Garfinkel, MD;  Location: [facility];  Service: Cardiopulmonary;  Laterality: N/A;    Current Medications: Current Meds  Medication Sig   acetaminophen (TYLENOL) 325 MG tablet Take 650 mg by mouth every 6 (six) hours as needed for moderate pain.   apixaban (ELIQUIS) 5 MG TABS tablet Take 5 mg by mouth 2 (two) times daily.   famotidine (PEPCID) 20 MG tablet TAKE 1 TABLET AT BEDTIME   Hyoscyamine Sulfate SL (LEVSIN/SL) 0.125 MG SUBL Place 1 tablet under the tongue 2 (two) times daily as needed.   metoprolol succinate (TOPROL XL) 25 MG 24 hr tablet Take 1 tablet (25 mg total) by mouth daily.   Multiple Vitamins-Minerals (CENTRUM SILVER 50+MEN) TABS Take 1 tablet by mouth daily.   omeprazole (PRILOSEC) 40 MG capsule Take 1 capsule (40 mg total) by mouth in the morning and at bedtime.   ondansetron (ZOFRAN) 4 MG tablet Take 1 tablet (4 mg total) by mouth every 8 (eight) hours as needed for nausea or vomiting.   tamsulosin (FLOMAX) 0.4 MG CAPS capsule Take 1 capsule (0.4 mg total) by mouth in the morning and at bedtime.   traMADol  (ULTRAM) 50 MG tablet Take 1 tablet (50 mg total) by mouth 2 (two) times daily as needed for moderate pain.   venlafaxine XR (EFFEXOR-XR) 150 MG 24 hr capsule TAKE 1 CAPSULE EVERY DAY WITH BREAKFAST   venlafaxine XR (EFFEXOR-XR) 75 MG 24 hr capsule TAKE 1 CAPSULE WITH THE 150 MG VENLAFAXINE CAPSULE DAILY   [DISCONTINUED] propafenone (RYTHMOL) 225 MG tablet TAKE 1 TABLET EVERY 8 HOURS     Allergies:   Mobic [meloxicam] and Statins   Social History   Socioeconomic History   Marital status: Married    Spouse name: [name]   Number of children: 2   Years of education: 16   Highest education level: Bachelor's degree (e.g., BA, AB, BS)  Occupational History   Occupation: retired    Fish farm manager: [employer]  Tobacco Use   Smoking status: Never   Smokeless tobacco: Never  Vaping Use   Vaping Use: Never used  Substance and Sexual Activity   Alcohol use: Not Currently    Alcohol/week: 7.0 standard drinks    Types:  7 Cans of beer per week    Comment: Stopped a year ago   Drug use: Never   Sexual activity: Yes  Other Topics Concern   Not on file  Social History Narrative   Patient lives with his wife in [place].    Patient has one son in [place] and one daughter in [place].    Patient walks daily at local parks/trails around [place].    Patient has one dog.    Social Determinants of Health   Financial Resource Strain: Low Risk    Difficulty of Paying Living Expenses: Not hard at all  Food Insecurity: No Food Insecurity   Worried About Charity fundraiser in the Last Year: Never true   [place] in the Last Year: Never true  Transportation Needs: No Transportation Needs   Lack of Transportation (Medical): No   Lack of Transportation (Non-Medical): No  Physical Activity: Sufficiently Active   Days of Exercise per Week: 6 days   Minutes of Exercise per Session: 40 min  Stress: No Stress Concern Present   Feeling of Stress : Not at all  Social Connections: Socially Integrated    Frequency of Communication with Friends and Family: More than three times a week   Frequency of Social Gatherings with Friends and Family: More than three times a week   Attends Religious Services: More than 4 times per year   Active Member of [employer] or Organizations: Yes   Attends Music therapist: More than 4 times per year   Marital Status: Married     Family History: The patient's family history includes Emphysema in his father; GER disease in his daughter, mother, and son; Lung cancer in his sister. There is no history of Colon cancer or Stomach cancer.  ROS:   Please see the history of present illness.     All other systems reviewed and are negative.  EKGs/Labs/Other Studies Reviewed:    The following studies were reviewed today:   EKG:  08/01/21: Normal sinus rhythm, rate 86, no ST abnormalities 07/22: normal sinus rhythm 1st degree AV block, rate 67, no ST abnormalities 05/18/20: NSR, rate 70, no ST/T changes   Cardiac monitor 04/22/19: No atrial fibrillation seen. 20 episodes of SVT, longest lasting 11 seconds.   13 days of data recorded on Zio monitor. Patient had a min HR of 59 bpm, max HR of 174 bpm, and avg HR of 87 bpm. Predominant underlying rhythm was Sinus Rhythm. No VT, atrial fibrillation, high degree block, or pauses noted. Isolated atrial and ventricular ectopy was rare (<1%). 20 episodes of of SVT, longest lasting 11 seconds (rate 144 bpm); fastest was rate 174 bpm, lasting 12 beats.There were 8 triggered events, which corresponded to sinus rhythm and rare PVCs. No significant arrhythmias detected.  TTE 04/02/19:  1. Left ventricular ejection fraction, by visual estimation, is 55 to 60%. The left ventricle has normal function. There is no left ventricular hypertrophy.  2. Left ventricular diastolic Doppler parameters are consistent with pseudonormalization pattern of LV diastolic filling.  3. Global right ventricle has normal systolic function.The  right ventricular size is mildly enlarged. No increase in right ventricular wall thickness.  4. Left atrial size was normal.  5. Right atrial size was normal.  6. The mitral valve is normal in structure. No evidence of mitral valve regurgitation. No evidence of mitral stenosis.  7. The tricuspid valve is normal in structure. Tricuspid valve regurgitation is trivial.  8. The aortic  valve is normal in structure. Aortic valve regurgitation was not visualized by color flow Doppler.  9. The pulmonic valve was normal in structure. Pulmonic valve regurgitation is not visualized by color flow Doppler. 10. Normal pulmonary artery systolic pressure. 11. The atrial septum is grossly normal.  Recent Labs: 01/02/2021: ALT 30 03/16/2021: BUN 14; Creatinine, Ser 0.88; Hemoglobin 16.0; Platelets 177; Potassium 4.6; Sodium 137  Recent Lipid Panel    Component Value Date/Time   CHOL 186 12/26/2019 0946   TRIG 176 (H) 12/26/2019 0946   HDL 67 12/26/2019 0946   CHOLHDL 2.8 12/26/2019 0946   LDLCALC 89 12/26/2019 0946    Physical Exam:    VS:  BP 120/80 (BP Location: Right Arm, Patient Position: Sitting)    Pulse 86    Ht 6' (1.829 m)    Wt 212 lb 6.4 oz (96.3 kg)    SpO2 98%    BMI 28.81 kg/m     Wt Readings from Last 3 Encounters:  08/01/21 212 lb 6.4 oz (96.3 kg)  06/10/21 210 lb 9.6 oz (95.5 kg)  05/05/21 207 lb 12.8 oz (94.3 kg)     GEN:  Well nourished, well developed in no acute distress HEENT: Normal NECK: No JVD; No carotid bruits CARDIAC: RRR, no murmurs, rubs, gallops RESPIRATORY:  Clear to auscultation without rales, wheezing or rhonchi  ABDOMEN: Soft, non-tender, non-distended MUSCULOSKELETAL: no LE edema; No deformity  SKIN: Warm and dry NEUROLOGIC:  Alert and oriented x 3 PSYCHIATRIC:  Normal affect   ASSESSMENT:    1. PAF (paroxysmal atrial fibrillation) ([place])   2. SVT (supraventricular tachycardia) ([place])   3. DOE (dyspnea on exertion)   4. Hyperlipidemia, unspecified  hyperlipidemia type      PLAN:     Atrial fibrillation: paroxysmal, on propafenone 225 mg 3 times daily and diltiazem 120 mg daily.  CHADS-VASc 2 given age.  Cardiac monitor 04/22/2019 shows no evidence of recurrent A. Fib. TTE on 04/02/2019 showed normal biventricular function, no significant valvular disease - Continue Eliquis 5 mg twice daily - He has been on propafenone but is interested in discontinuing, has not had any A-fib in years.  Discussed with pharmacy plan for weaning off propafenone, do not need to taper, will discontinue - Continue Toprol-XL 25 mg daily  SVT: Having intermittent episodes on monitor, longest lasting up to 11 seconds.  Reports palpitations have resolved.  Continue Toprol-XL and propafenone as above  Dyspnea: Following with pulmonology, CT chest 11/30/2019 with findings consistent with sequela of prior inflammation.  PFTs on 12/12/2019 with findings suggestive of restriction but normal lung volumes.  Diastolic dysfunction could be contributing, though BNP checked at prior clinic visit and was unremarkable (21).  Underwent bronchoscopy with biopsy, which was unremarkable.  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  He reports dyspnea has improved  Hyperlipidemia: LDL 101 on 06/10/2019.  Started on atorvastatin but unable to tolerate due to myalgias.  Calcium score 0 on 03/12/2020.  Can hold off on statin.  RTC in 6 months  Medication Adjustments/Labs and Tests Ordered: Current medicines are reviewed at length with the patient today.  Concerns regarding medicines are outlined above.  Orders Placed This Encounter  Procedures   EKG 12-Lead    No orders of the defined types were placed in this encounter.    Patient Instructions  Medication Instructions:  PHARMD is looking up how to come off Propafenone, we will be in contact.  *  If you need a refill on your cardiac medications before  your next appointment, please call your pharmacy*  Follow-Up: At [facility], you and your health needs are our priority.  As part of our continuing mission to provide you with exceptional heart care, we have created designated Provider Care Teams.  These Care Teams include your primary Cardiologist (physician) and Advanced Practice Providers (APPs -  Physician Assistants and Nurse Practitioners) who all work together to provide you with the care you need, when you need it.  We recommend signing up for the patient portal called "MyChart".  Sign up information is provided on this After Visit Summary.  MyChart is used to connect with patients for Virtual Visits (Telemedicine).  Patients are able to view lab/test results, encounter notes, upcoming appointments, etc.  Non-urgent messages can be sent to your provider as well.   To learn more about what you can do with MyChart, go to [email or web address].    Your next appointment:   6 month(s)  The format for your next appointment:   In Person  Provider:   Donato Heinz, MD       Signed, Donato Heinz, MD  08/04/2021 1:03 PM    [place]

## 2021-08-01 ENCOUNTER — Other Ambulatory Visit: Payer: Self-pay

## 2021-08-01 ENCOUNTER — Encounter: Payer: Self-pay | Admitting: Cardiology

## 2021-08-01 ENCOUNTER — Ambulatory Visit: Payer: Medicare HMO | Admitting: Cardiology

## 2021-08-01 VITALS — BP 120/80 | HR 86 | Ht 72.0 in | Wt 212.4 lb

## 2021-08-01 DIAGNOSIS — E785 Hyperlipidemia, unspecified: Secondary | ICD-10-CM

## 2021-08-01 DIAGNOSIS — R0609 Other forms of dyspnea: Secondary | ICD-10-CM

## 2021-08-01 DIAGNOSIS — I471 Supraventricular tachycardia: Secondary | ICD-10-CM

## 2021-08-01 DIAGNOSIS — I480 Paroxysmal atrial fibrillation: Secondary | ICD-10-CM | POA: Diagnosis not present

## 2021-08-01 NOTE — Patient Instructions (Signed)
Medication Instructions:  PHARMD is looking up how to come off Propafenone, we will be in contact.  *If you need a refill on your cardiac medications before your next appointment, please call your pharmacy*  Follow-Up: At [facility], you and your health needs are our priority.  As part of our continuing mission to provide you with exceptional heart care, we have created designated Provider Care Teams.  These Care Teams include your primary Cardiologist (physician) and Advanced Practice Providers (APPs -  Physician Assistants and Nurse Practitioners) who all work together to provide you with the care you need, when you need it.  We recommend signing up for the patient portal called "MyChart".  Sign up information is provided on this After Visit Summary.  MyChart is used to connect with patients for Virtual Visits (Telemedicine).  Patients are able to view lab/test results, encounter notes, upcoming appointments, etc.  Non-urgent messages can be sent to your provider as well.   To learn more about what you can do with MyChart, go to [email or web address].    Your next appointment:   6 month(s)  The format for your next appointment:   In Person  Provider:   Donato Heinz, MD

## 2021-08-03 DIAGNOSIS — L988 Other specified disorders of the skin and subcutaneous tissue: Secondary | ICD-10-CM | POA: Diagnosis not present

## 2021-08-03 DIAGNOSIS — D0361 Melanoma in situ of right upper limb, including shoulder: Secondary | ICD-10-CM | POA: Diagnosis not present

## 2021-08-12 ENCOUNTER — Encounter: Payer: Self-pay | Admitting: Gastroenterology

## 2021-08-16 ENCOUNTER — Other Ambulatory Visit: Payer: Self-pay

## 2021-08-16 ENCOUNTER — Encounter (HOSPITAL_COMMUNITY): Payer: Self-pay | Admitting: Emergency Medicine

## 2021-08-16 ENCOUNTER — Ambulatory Visit (HOSPITAL_COMMUNITY)
Admission: EM | Admit: 2021-08-16 | Discharge: 2021-08-16 | Disposition: A | Discharge status: Home / Self Care | Payer: Medicare HMO | Attending: Student | Admitting: Student

## 2021-08-16 DIAGNOSIS — S61215A Laceration without foreign body of left ring finger without damage to nail, initial encounter: Secondary | ICD-10-CM | POA: Diagnosis not present

## 2021-08-16 MED ORDER — CEPHALEXIN 500 MG PO CAPS: 500.0000 mg | ORAL_CAPSULE | Freq: Four times a day (QID) | ORAL | 0 refills | Status: DC

## 2021-08-16 NOTE — ED Provider Notes (Signed)
[facility]    CSN: [id number] Arrival date & time: 08/16/21  1409      History   Chief Complaint Chief Complaint  Patient presents with   Finger Injury    HPI Thomas Dominguez. Yu is a 76 y.o. male presenting with cut to left ring finger sustained 3 hours ago.  Medical history noncontributory, states he is not immunocompromised.  States that he was gardening, and cut the left ring finger with some lawn shears.  He does take Eliquis, but bleeding is controlled.  Tdap 2020.  Denies sensation changes.  States he immediately removed his wedding ring upon injuring the finger, wrapped it, and presented to the urgent care. States the curvature in the L ring DIP joint is preexisting and not exacerbated by the injury. He is right handed.   HPI  Past Medical History:  Diagnosis Date   Acute ataxia 09/30/2019   Acute pneumothorax 04/08/2020   Atrial fibrillation ([place]) 01/17/2019   BPH (benign prostatic hyperplasia) 01/17/2019   Chronic pulmonary aspiration    Degenerative disc disease, lumbar 01/17/2019   Depression, major, single episode, complete remission ([place]) 01/17/2019   Displacement of lumbar intervertebral disc without myelopathy 04/22/2010   Diverticulosis 01/17/2019   Dysrhythmia    Dysrhythmias 12/19/2011   Esophageal cancer (HCC)    GERD (gastroesophageal reflux disease)    H/O small bowel obstruction 01/17/2019   Hiatal hernia 01/17/2019   History of esophagectomy 90s at [facility] for severe Barrett's with microfocus of cancer 11/22/2019   Hypertension    ILD (interstitial lung disease) ([place])    Inguinal hernia    bilateral, left more prominant   Left inguinal hernia 03/21/2021   Leg swelling 01/17/2019   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[place])    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   Pneumonia 11/2019   Pneumothorax 04/08/2020   SBO (small bowel obstruction) ([place]) 11/20/2019   Status post left inguinal hernia repair 04/15/2021   Wears glasses     Patient Active Problem List    Diagnosis Date Noted   Mood disorder ([place]) 05/06/2021   Aspiration, chronic pulmonary, subsequent encounter 08/26/2020   Dyspnea on exertion 09/05/2019   Hyperlipidemia 09/05/2019   BPH (benign prostatic hyperplasia) 01/17/2019   Degenerative disc disease, lumbar 01/17/2019   Chronic pain syndrome 10/06/2013   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   Unspecified atrial fibrillation ([place]) 04/13/2010   GERD (gastroesophageal reflux disease) 04/13/2010    Past Surgical History:  Procedure Laterality Date   ABDOMINAL HERNIA REPAIR  2012, 2014, 2017   ventral   BRONCHIAL BIOPSY  04/08/2020   Procedure: BRONCHIAL BIOPSIES;  Surgeon: Marshell Garfinkel, MD;  [facility];  Service: Cardiopulmonary;;   BRONCHIAL BRUSHINGS  04/08/2020   Procedure: BRONCHIAL BRUSHINGS;  Surgeon: Marshell Garfinkel, MD;  [facility];  Service: Cardiopulmonary;;   BRONCHIAL WASHINGS  04/08/2020   Procedure: BRONCHIAL WASHINGS;  Surgeon: Marshell Garfinkel, MD;  [facility];  Service: Cardiopulmonary;;   COLONOSCOPY  2019   ESOPHAGECTOMY  1997   for esophageal cancer   ESOPHAGOGASTRODUODENOSCOPY (EGD) WITH PROPOFOL N/A 06/11/2020   Procedure: ESOPHAGOGASTRODUODENOSCOPY (EGD) WITH PROPOFOL;  Surgeon: Mauri Pole, MD;  [facility];  Service: Endoscopy;  Laterality: N/A;   INGUINAL HERNIA REPAIR Left 03/22/2021   Procedure: OPEN REPAIR LEFT INGUINAL HERNIA WITH MESH;  Surgeon: [name], MD;  [facility];  Service: General;  Laterality: Left;   PARATHYROIDECTOMY Right 01/26/2020   Procedure: RIGHT INFERIOR PARATHYROIDECTOMY;  Surgeon: [name], MD;  Location: [facility];  Service: General;  Laterality: Right;   SPINAL FUSION  2018, 2020   L2 L5 in 2018, L5-S1 2020   VIDEO BRONCHOSCOPY N/A 04/08/2020   Procedure: VIDEO BRONCHOSCOPY WITH FLUORO;  Surgeon: Marshell Garfinkel, MD;  [facility];  Service: Cardiopulmonary;  Laterality: N/A;       Home Medications     Prior to Admission medications   Medication Sig Start Date End Date Taking? Authorizing Provider  cephALEXin (KEFLEX) 500 MG capsule Take 1 capsule (500 mg total) by mouth 4 (four) times daily. 08/16/21  Yes Hazel Sams, PA-C  acetaminophen (TYLENOL) 325 MG tablet Take 650 mg by mouth every 6 (six) hours as needed for moderate pain.    [provider]  apixaban (ELIQUIS) 5 MG TABS tablet Take 5 mg by mouth 2 (two) times daily.    [provider]  famotidine (PEPCID) 20 MG tablet TAKE 1 TABLET AT BEDTIME 06/01/21   Nandigam, [name], MD  Hyoscyamine Sulfate SL (LEVSIN/SL) 0.125 MG SUBL Place 1 tablet under the tongue 2 (two) times daily as needed. 03/08/21   Mauri Pole, MD  metoprolol succinate (TOPROL XL) 25 MG 24 hr tablet Take 1 tablet (25 mg total) by mouth daily. 01/17/21   Donato Heinz, MD  Multiple Vitamins-Minerals (CENTRUM SILVER 50+MEN) TABS Take 1 tablet by mouth daily.    [provider]  omeprazole (PRILOSEC) 40 MG capsule Take 1 capsule (40 mg total) by mouth in the morning and at bedtime. 01/04/21   Ezekiel Slocumb, DO  ondansetron (ZOFRAN) 4 MG tablet Take 1 tablet (4 mg total) by mouth every 8 (eight) hours as needed for nausea or vomiting. 03/08/21   Mauri Pole, MD  tamsulosin (FLOMAX) 0.4 MG CAPS capsule Take 1 capsule (0.4 mg total) by mouth in the morning and at bedtime. 05/27/20   Benay Pike, MD  traMADol (ULTRAM) 50 MG tablet Take 1 tablet (50 mg total) by mouth 2 (two) times daily as needed for moderate pain. 05/06/21   McDiarmid, Blane Ohara, MD  venlafaxine XR (EFFEXOR-XR) 150 MG 24 hr capsule TAKE 1 CAPSULE EVERY DAY WITH BREAKFAST 11/16/20   Benay Pike, MD  venlafaxine XR (EFFEXOR-XR) 75 MG 24 hr capsule TAKE 1 CAPSULE WITH THE 150 MG VENLAFAXINE CAPSULE DAILY 07/27/21   [place] Guiles, DO    Family History Family History  Problem Relation Age of Onset   Emphysema Father        smoker   GER disease  Daughter    GER disease Son    GER disease Mother    Lung cancer Sister        never smoked   Colon cancer Neg Hx    Stomach cancer Neg Hx     Social History Social History   Tobacco Use   Smoking status: Never   Smokeless tobacco: Never  Vaping Use   Vaping Use: Never used  Substance Use Topics   Alcohol use: Not Currently    Alcohol/week: 7.0 standard drinks    Types: 7 Cans of beer per week    Comment: Stopped a year ago   Drug use: Never     Allergies   Mobic [meloxicam] and Statins   Review of Systems Review of Systems  Skin:  Positive for wound.  All other systems reviewed and are negative.   Physical Exam Triage Vital Signs ED Triage Vitals  Enc Vitals Group     BP 08/16/21 1547  128/82     Pulse Rate 08/16/21 1547 95     Resp 08/16/21 1547 20     Temp 08/16/21 1547 97.8 F (36.6 C)     Temp Source 08/16/21 1547 Oral     SpO2 08/16/21 1547 94 %     Weight --      Height --      Head Circumference --      Peak Flow --      Pain Score 08/16/21 1545 3     Pain Loc --      Pain Edu? --      Excl. in [place]? --    No data found.  Updated Vital Signs BP 128/82 (BP Location: Right Arm)    Pulse 95    Temp 97.8 F (36.6 C) (Oral)    Resp 20    SpO2 94%   Visual Acuity Right Eye Distance:   Left Eye Distance:   Bilateral Distance:    Right Eye Near:   Left Eye Near:    Bilateral Near:     Physical Exam Vitals reviewed.  Constitutional:      General: He is not in acute distress.    Appearance: Normal appearance. He is not ill-appearing.  HENT:     Head: Normocephalic and atraumatic.  Pulmonary:     Effort: Pulmonary effort is normal.  Skin:    Comments: See image below L ring finger - dorsal aspect of middle phalanx with 1.5cm laceration. Bleeding is controlled. Surrounding sensation is intact. ROM extension and flexion is intact. There is preexisting curvature (mallet joint) in the DIP joint from prior injury. Cap refill <2 seconds, radial  pulse 2+, grip strength 5/5.   Neurological:     General: No focal deficit present.     Mental Status: He is alert and oriented to person, place, and time.  Psychiatric:        Mood and Affect: Mood normal.        Behavior: Behavior normal.        Thought Content: Thought content normal.        Judgment: Judgment normal.      UC Treatments / Results  Labs (all labs ordered are listed, but only abnormal results are displayed) Labs Reviewed - No data to display  EKG   Radiology No results found.  Procedures Laceration Repair  Date/Time: 08/16/2021 4:47 PM Performed by: Hazel Sams, PA-C Authorized by: Hazel Sams, PA-C   Consent:    Consent obtained:  Verbal   Consent given by:  Patient   Risks, benefits, and alternatives were discussed: yes     Risks discussed:  Infection, need for additional repair, poor cosmetic result, pain, tendon damage and poor wound healing   Alternatives discussed:  No treatment Universal protocol:    Procedure explained and questions answered to patient or proxy's satisfaction: yes     Relevant documents present and verified: yes     Immediately prior to procedure, a time out was called: yes     Patient identity confirmed:  Verbally with patient and arm band Anesthesia:    Anesthesia method:  Local infiltration and topical application   Local anesthetic:  Lidocaine 1% w/o epi Laceration details:    Location:  Finger   Finger location:  L ring finger   Length (cm):  1.5   Depth (mm):  1.5 Treatment:    Area cleansed with:  Povidone-iodine   Amount of cleaning:  Extensive  Irrigation solution:  Sterile saline   Irrigation method:  Syringe   Debridement:  None Skin repair:    Repair method:  Sutures   Suture size:  4-0   Wound skin closure material used: monofilament.   Suture technique:  Simple interrupted   Number of sutures:  6 Approximation:    Approximation:  Close Repair type:    Repair type:   Intermediate Post-procedure details:    Dressing:  Non-adherent dressing   Procedure completion:  Tolerated well, no immediate complications Comments:       (including critical care time)  Medications Ordered in UC Medications - No data to display  Initial Impression / Assessment and Plan / UC Course  I have reviewed the triage vital signs and the nursing notes.  Pertinent labs & imaging results that were available during my care of the patient were reviewed by me and considered in my medical decision making (see chart for details).     This patient is a very pleasant 76 y.o. year old male presenting with laceration to the L ring finger. Neurovascularly intact.  Sutured as above with 6 nonabsorbable sutures. Patient tolerated the procedure well. He does take eliquis daily but bleeding was controlled throughout procedure.  Tdap UTD 2020. He is not immunocompromised but given keep contaminated wound keflex sent as below. Return in approx 10 days for suture removal, or sooner if signs of infection.   ED return precautions discussed. Patient verbalizes understanding and agreement.    Final Clinical Impressions(s) / UC Diagnoses   Final diagnoses:  Laceration of left ring finger without foreign body without damage to nail, initial encounter     Discharge Instructions      -Start the antibiotic: Keflex, 4x daily x5 days. You can take this with food if you have a sensitive stomach. -It is important to keep your wound dry, especially for the first 24 hours. ... After the first 24 hours you can wet the wound for a short time, for example in the shower or to wash your hands. ... Do not soak the wound or swim until the sutures have been removed. Avoid ointments as these keep the wound too moist  -Use the finger splint as much as possible for the first 3-4 days.  -Return to clinic in about 8-10 days for suture removal. Return sooner if redness, swelling, discharge, new fevers   ED  Prescriptions     Medication Sig Dispense Auth. Provider   cephALEXin (KEFLEX) 500 MG capsule Take 1 capsule (500 mg total) by mouth 4 (four) times daily. 20 capsule Hazel Sams, PA-C      PDMP not reviewed this encounter.   Hazel Sams, PA-C 08/16/21 [phone]

## 2021-08-16 NOTE — Discharge Instructions (Addendum)
-  Start the antibiotic: Keflex, 4x daily x5 days. You can take this with food if you have a sensitive stomach. -It is important to keep your wound dry, especially for the first 24 hours. ... After the first 24 hours you can wet the wound for a short time, for example in the shower or to wash your hands. ... Do not soak the wound or swim until the sutures have been removed. Avoid ointments as these keep the wound too moist  -Use the finger splint as much as possible for the first 3-4 days.  -Return to clinic in about 8-10 days for suture removal. Return sooner if redness, swelling, discharge, new fevers

## 2021-08-16 NOTE — ED Triage Notes (Addendum)
Cut finger with lawn sheers around 1:00 today.  Last tetanus was in 2020.  Marland Kitchen  Patient is able to bend finger as usual per patient.  Patient is on eliquis.  Laceration is straight , bleeding controlled.  Laceration is left ring finger

## 2021-08-25 ENCOUNTER — Ambulatory Visit: Payer: Medicare HMO | Admitting: Gastroenterology

## 2021-08-26 DIAGNOSIS — N400 Enlarged prostate without lower urinary tract symptoms: Secondary | ICD-10-CM | POA: Diagnosis not present

## 2021-08-26 DIAGNOSIS — Z7901 Long term (current) use of anticoagulants: Secondary | ICD-10-CM | POA: Diagnosis not present

## 2021-08-26 DIAGNOSIS — K311 Adult hypertrophic pyloric stenosis: Secondary | ICD-10-CM | POA: Diagnosis not present

## 2021-08-26 DIAGNOSIS — Z8501 Personal history of malignant neoplasm of esophagus: Secondary | ICD-10-CM | POA: Diagnosis not present

## 2021-08-26 DIAGNOSIS — K219 Gastro-esophageal reflux disease without esophagitis: Secondary | ICD-10-CM | POA: Diagnosis not present

## 2021-08-26 DIAGNOSIS — Z9049 Acquired absence of other specified parts of digestive tract: Secondary | ICD-10-CM | POA: Diagnosis not present

## 2021-08-26 DIAGNOSIS — K30 Functional dyspepsia: Secondary | ICD-10-CM | POA: Diagnosis not present

## 2021-08-26 DIAGNOSIS — Z79899 Other long term (current) drug therapy: Secondary | ICD-10-CM | POA: Diagnosis not present

## 2021-08-26 DIAGNOSIS — I480 Paroxysmal atrial fibrillation: Secondary | ICD-10-CM | POA: Diagnosis not present

## 2021-09-13 ENCOUNTER — Telehealth: Payer: Self-pay | Admitting: *Deleted

## 2021-09-13 NOTE — Telephone Encounter (Signed)
Eliquis patient assistance faxed to [facility] 

## 2021-09-24 ENCOUNTER — Encounter: Payer: Self-pay | Admitting: Cardiology

## 2021-10-10 ENCOUNTER — Ambulatory Visit: Payer: Medicare HMO | Admitting: Gastroenterology

## 2021-10-10 ENCOUNTER — Encounter: Payer: Self-pay | Admitting: Gastroenterology

## 2021-10-10 VITALS — BP 122/76 | HR 82 | Ht 72.0 in | Wt 209.6 lb

## 2021-10-10 DIAGNOSIS — R109 Unspecified abdominal pain: Secondary | ICD-10-CM

## 2021-10-10 DIAGNOSIS — R110 Nausea: Secondary | ICD-10-CM | POA: Diagnosis not present

## 2021-10-10 DIAGNOSIS — Z8719 Personal history of other diseases of the digestive system: Secondary | ICD-10-CM | POA: Diagnosis not present

## 2021-10-10 NOTE — Progress Notes (Signed)
? ?       ? ?Thomas Dominguez    [id number]    1946-04-15 ? ?Primary Care Physician:Dahbura, Fenton Malling, DO ? ?Referring Physician: Wells Guiles, DO ?[address] ?[place],  [place] [address] ? ? ?Chief complaint:  GERD, Nausea ? ?HPI: ? 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 ?He continues to have intermittent abdominal distention and cramping associated with nausea, improves with dietary modification ?He is worried about partial or intermittent bowel obstruction ?He was hospitalized with small bowel obstruction in July 2022, spontaneously resolved with bowel rest and NG tube suction.  This was fifth or sixth hospitalization in the past few years with SBO. ?His bowel habits are regular, he is taking Benefiber/operative fiber daily.  Denies any constipation, diarrhea, melena or blood per rectum. ?  ?He feels his GERD and dyspepsia symptoms have improved after he had pyloric channel dilation.  He is not a candidate for surgical revision ?He is taking omeprazole twice daily, continues to have breakthrough reflux symptoms on average once or twice a week.  Denies any dysphagia or odynophagia. ? ?  ?CT abdomen pelvis with contrast January 31, 2021 ?1. Interval resolution of small bowel dilatation. No current ?evidence of bowel obstruction. ?2. Colonic diverticulosis with unchanged sigmoid colon wall ?thickening, possibly chronic and related to previous inflammation. ?No definite evidence of acute diverticulitis. ?3. Unchanged large hiatal hernia containing a short segment of the ?transverse colon and portions of the pancreas and retroperitoneal ?vasculature. ?4. Diffuse bladder wall thickening, increased from the prior CT. ?Correlate for cystitis. ?5. Unchanged peribronchovascular consolidation in the lung bases ?with differential considerations as previously detailed. ?  ?  ?CT chest  March 07, 2021 ?Peribronchovascular consolidations and ground-glass nodular ?opacities are unchanged in appearance compared to prior exam and ?likely sequela of aspiration. Areas of low attenuation are seen ?within the consolidations, suggesting a component of lipoid ?pneumonia. ?Status post esophagectomy with gastric pull-through. ?  ?CT cardiac coronary March 12, 2020: Cardiac coronary calcium score 0 ?  ?CT chest high-resolution March 12, 2020: Widespread areas of thickening of peribronchovascular interstitium with architectural distortion including nodular and masslike opacities, surrounding groundglass attenuation and micronodularity stable compared to March 2021.  ?  ?CT abdomen pelvis Nov 20, 2019: Mid small bowel obstruction likely due to adhesions.  Airspace disease in bilateral lungs worrisome for aspiration ?  ?04/02/2019: LVEF 55-60% ?  ?Colonoscopy 10/03/2017 Dr. Shirlyn Goltz (f/u diverticulitis)- severe pancolonic diverticulosis, tortuosity & spasm ? ?EGD 03/2015 Dr. Shirlyn Goltz- changes of prior esophagectomy and gastric pull-through, normal mucosa throughout ? ?EGD 03/2011 Dr. Shirlyn Goltz- surgically shortened anastomosis at 21 cm, no Barrett's changes seen. Fair amount of solid food retention. Gastric food retention obscuring pylorus. Very angulated atrium without stenosis, probably contributing to emptying difficulties. Because of coughing difficulties did not pursue duodenal intubation ? ?EGD 10/2010 Dr. Shirlyn Goltz- gastric food retention obscuring pylorus ? ?CLN 09/12/2012 Dr. Shirlyn Goltz- pancolonic diverticulosis, less than ideal prep, internal hemorrhoids ? ?CLN 2004- severe left-sided diverticulosis ? ?RECENT GI IMAGING STUDIES:  ?AXR 05/2018- no free air identified, no dilated bowel loops ? ?CTAP 05/2018 ?SMALL BOWEL OBSTRUCTION. TRANSITION POINT IS NEAR A SMALL BOWEL ?ANASTOMOSIS IN THE DISTAL SMALL BOWEL CENTRAL ABDOMEN IN THE ?REGION OF THE CENTRAL MESENTERY. EXACT TRANSITION POINT IS ?DIFFICULT TO  IDENTIFY. EFFUSIONS CANNOT BE EXCLUDED. NO ?DEFINITIVE FOCAL MASS. NO ADDITIONAL SIGNIFICANT CHANGE. ? ?PET scan 07/2017 ?NO EVIDENCE OF HYPERMETABOLIC MALIGNANCY IN THE  CHEST, ABDOMEN OR PELVIS. ?HYPERMETABOLIC ACTIVITY NEAR THE SMALL BOWEL ANASTOMOSIS HAS THE ?APPEARANCE OF PHYSIOLOGIC INTRALUMINAL BOWEL ACTIVITY WITH NO ?PERCEIVED MASS SEEN ON TODAY'S EXAM ? ?PREVIOUS ABDOMINAL SURGERIES: Ventral hernia repair, paraesophageal hernia repair (2013), esophagectomy (3143) ? ?CANCER HISTORY: Esophageal cancer s/p esophagectomy 1997 at [facility]. No family history of colon cancer. ?  ?Barium swallow 08/28/96 ?Post op anastomotic stricture s/p esophagogastrectomy. 42m barium tablet did not traverse. ? ?Outpatient Encounter Medications as of 10/10/2021  ?Medication Sig  ? acetaminophen (TYLENOL) 325 MG tablet Take 650 mg by mouth every 6 (six) hours as needed for moderate pain.  ? apixaban (ELIQUIS) 5 MG TABS tablet Take 5 mg by mouth 2 (two) times daily.  ? famotidine (PEPCID) 20 MG tablet TAKE 1 TABLET AT BEDTIME  ? Hyoscyamine Sulfate SL (LEVSIN/SL) 0.125 MG SUBL Place 1 tablet under the tongue 2 (two) times daily as needed.  ? metoprolol succinate (TOPROL XL) 25 MG 24 hr tablet Take 1 tablet (25 mg total) by mouth daily.  ? Multiple Vitamins-Minerals (CENTRUM SILVER 50+MEN) TABS Take 1 tablet by mouth daily.  ? omeprazole (PRILOSEC) 40 MG capsule Take 1 capsule (40 mg total) by mouth in the morning and at bedtime.  ? ondansetron (ZOFRAN) 4 MG tablet Take 1 tablet (4 mg total) by mouth every 8 (eight) hours as needed for nausea or vomiting.  ? tamsulosin (FLOMAX) 0.4 MG CAPS capsule Take 1 capsule (0.4 mg total) by mouth in the morning and at bedtime.  ? traMADol (ULTRAM) 50 MG tablet Take 1 tablet (50 mg total) by mouth 2 (two) times daily as needed for moderate pain.  ? venlafaxine XR (EFFEXOR-XR) 150 MG 24 hr capsule TAKE 1 CAPSULE EVERY DAY WITH BREAKFAST  ? venlafaxine XR (EFFEXOR-XR) 75 MG 24 hr capsule TAKE 1 CAPSULE  WITH THE 150 MG VENLAFAXINE CAPSULE DAILY  ? [DISCONTINUED] cephALEXin (KEFLEX) 500 MG capsule Take 1 capsule (500 mg total) by mouth 4 (four) times daily.  ? ?No facility-administered encounter medications on file as of 10/10/2021.  ? ? ?Allergies as of 10/10/2021 - Review Complete 10/10/2021  ?Allergen Reaction Noted  ? Mobic [meloxicam] Shortness Of Breath 01/17/2019  ? Statins Other (See Comments) 01/17/2019  ? ? ?Past Medical History:  ?Diagnosis Date  ? Acute ataxia 09/30/2019  ? Acute pneumothorax 04/08/2020  ? Atrial fibrillation ([place] 01/17/2019  ? BPH (benign prostatic hyperplasia) 01/17/2019  ? Chronic pulmonary aspiration   ? Degenerative disc disease, lumbar 01/17/2019  ? Depression, major, single episode, complete remission ([place] 01/17/2019  ? Displacement of lumbar intervertebral disc without myelopathy 04/22/2010  ? Diverticulosis 01/17/2019  ? Dysrhythmia   ? Dysrhythmias 12/19/2011  ? Esophageal cancer ([place]   ? GERD (gastroesophageal reflux disease)   ? H/O small bowel obstruction 01/17/2019  ? Hiatal hernia 01/17/2019  ? [facility]/22/2019  ? Hypertension   ? ILD (interstitial lung disease) ([place]   ? Inguinal hernia   ? bilateral, left more prominant  ? Left inguinal hernia 03/21/2021  ? Leg swelling 01/17/2019  ? 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  ? Pneumonia 11/2019  ? Pneumothorax 04/08/2020  ? S[place] ([place] obstruction) ([place] 11/20/2019  ? Status post left inguinal hernia repair 04/15/2021  ? Wears glasses   ? ? ?Past Surgical History:  ?Procedure Laterality Date  ? ABDOMINAL HERNIA REPAIR  2012, 2014, 2017  ? ventral  ? BRONCHIAL BIOPSY  04/08/2020  ? Procedure: BRONCHIAL BIOPSIES;  Surgeon: Marshell Garfinkel, MD;  [facility];  Service: Cardiopulmonary;;  ? BRONCHIAL BRUSHINGS  04/08/2020  ? Procedure: BRONCHIAL BRUSHINGS;  Surgeon: Marshell Garfinkel, MD;  [facility];   Service: Cardiopulmonary;;  ? BRONCHIAL WASHINGS  04/08/2020  ? Procedure: BRONCHIAL WASHINGS;  Surgeon: Marshell Garfinkel, MD;  [facility];  Service: Cardiopulmonary;;  ? COLONOSCOPY  2019  ? [place]

## 2021-10-10 NOTE — Progress Notes (Incomplete)
? ?       ? ?Thomas Dominguez    [id number]    1945/10/23 ? ?Primary Care Physician:Dahbura, Fenton Malling, DO ? ?Referring Physician: Wells Guiles, DO ?[address] ?[place],   [address] ? ? ?Chief complaint:  *** ? ?HPI: ? 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  ?He feels his symptoms have improved after he had pyloric channel dilation.  He is not a candidate for surgical revision ?He is taking omeprazole twice daily, continues to have breakthrough reflux symptoms on average once or twice a week.  Denies any dysphagia or odynophagia. ?  ?He was hospitalized with small bowel obstruction in July 2022, spontaneously resolved with bowel rest and NG tube suction.  This was fifth or sixth hospitalization in the past few years with SBO. ?His bowel habits are regular, he is taking Benefiber/operative fiber daily.  Denies any constipation, diarrhea, melena or blood per rectum. ?Continues to have intermittent abdominal cramping ?  ?CT abdomen pelvis with contrast January 31, 2021 ?1. Interval resolution of small bowel dilatation. No current ?evidence of bowel obstruction. ?2. Colonic diverticulosis with unchanged sigmoid colon wall ?thickening, possibly chronic and related to previous inflammation. ?No definite evidence of acute diverticulitis. ?3. Unchanged large hiatal hernia containing a short segment of the ?transverse colon and portions of the pancreas and retroperitoneal ?vasculature. ?4. Diffuse bladder wall thickening, increased from the prior CT. ?Correlate for cystitis. ?5. Unchanged peribronchovascular consolidation in the lung bases ?with differential considerations as previously detailed. ?  ?  ?He is currently undergoing pulmonary work-up for abnormal CT chest, is scheduled to undergo bronchoscopy later this month ?  ?CT chest March 07, 2021 ?Peribronchovascular  consolidations and ground-glass nodular ?opacities are unchanged in appearance compared to prior exam and ?likely sequela of aspiration. Areas of low attenuation are seen ?within the consolidations, suggesting a component of lipoid ?pneumonia. ?Status post esophagectomy with gastric pull-through. ?  ?CT cardiac coronary March 12, 2020: Cardiac coronary calcium score 0 ?  ?CT chest high-resolution March 12, 2020: Widespread areas of thickening of peribronchovascular interstitium with architectural distortion including nodular and masslike opacities, surrounding groundglass attenuation and micronodularity stable compared to March 2021.  ?  ?CT abdomen pelvis Nov 20, 2019: Mid small bowel obstruction likely due to adhesions.  Airspace disease in bilateral lungs worrisome for aspiration ?  ?04/02/2019: LVEF 55-60% ?  ?Colonoscopy 10/03/2017 Dr. Shirlyn Goltz (f/u diverticulitis)- severe pancolonic diverticulosis, tortuosity & spasm ? ?EGD 03/2015 Dr. Shirlyn Goltz- changes of prior esophagectomy and gastric pull-through, normal mucosa throughout ? ?EGD 03/2011 Dr. Shirlyn Goltz- surgically shortened anastomosis at 21 cm, no Barrett's changes seen. Fair amount of solid food retention. Gastric food retention obscuring pylorus. Very angulated atrium without stenosis, probably contributing to emptying difficulties. Because of coughing difficulties did not pursue duodenal intubation ? ?EGD 10/2010 Dr. Shirlyn Goltz- gastric food retention obscuring pylorus ? ?CLN 09/12/2012 Dr. Shirlyn Goltz- pancolonic diverticulosis, less than ideal prep, internal hemorrhoids ? ?CLN 2004- severe left-sided diverticulosis ? ?RECENT GI IMAGING STUDIES:  ?AXR 05/2018- no free air identified, no dilated bowel loops ? ?CTAP 05/2018 ?SMALL BOWEL OBSTRUCTION. TRANSITION POINT IS NEAR A SMALL BOWEL ?ANASTOMOSIS IN THE DISTAL SMALL BOWEL CENTRAL ABDOMEN IN THE ?REGION OF THE CENTRAL MESENTERY. EXACT TRANSITION POINT IS ?DIFFICULT TO IDENTIFY. EFFUSIONS CANNOT BE EXCLUDED.  NO ?DEFINITIVE FOCAL MASS. NO ADDITIONAL SIGNIFICANT CHANGE. ? ?PET scan 07/2017 ?NO EVIDENCE OF HYPERMETABOLIC MALIGNANCY IN THE CHEST, ABDOMEN OR  PELVIS. ?HYPERMETABOLIC ACTIVITY NEAR THE SMALL BOWEL ANASTOMOSIS HAS THE ?APPEARANCE OF PHYSIOLOGIC INTRALUMINAL BOWEL ACTIVITY WITH NO ?PERCEIVED MASS SEEN ON TODAY'S EXAM ? ?PREVIOUS ABDOMINAL SURGERIES: Ventral hernia repair, paraesophageal hernia repair (2013), esophagectomy (4854) ? ?CANCER HISTORY: Esophageal cancer s/p esophagectomy 1997 at [name]. No family history of colon cancer. ?  ?Barium swallow 08/28/96 ?Post op anastomotic stricture s/p esophagogastrectomy. 17m barium tablet did not traverse. ?  ? ? ?Outpatient Encounter Medications as of 10/10/2021  ?Medication Sig  ? acetaminophen (TYLENOL) 325 MG tablet Take 650 mg by mouth every 6 (six) hours as needed for moderate pain.  ? apixaban (ELIQUIS) 5 MG TABS tablet Take 5 mg by mouth 2 (two) times daily.  ? famotidine (PEPCID) 20 MG tablet TAKE 1 TABLET AT BEDTIME  ? Hyoscyamine Sulfate SL (LEVSIN/SL) 0.125 MG SUBL Place 1 tablet under the tongue 2 (two) times daily as needed.  ? metoprolol succinate (TOPROL XL) 25 MG 24 hr tablet Take 1 tablet (25 mg total) by mouth daily.  ? Multiple Vitamins-Minerals (CENTRUM SILVER 50+MEN) TABS Take 1 tablet by mouth daily.  ? omeprazole (PRILOSEC) 40 MG capsule Take 1 capsule (40 mg total) by mouth in the morning and at bedtime.  ? ondansetron (ZOFRAN) 4 MG tablet Take 1 tablet (4 mg total) by mouth every 8 (eight) hours as needed for nausea or vomiting.  ? tamsulosin (FLOMAX) 0.4 MG CAPS capsule Take 1 capsule (0.4 mg total) by mouth in the morning and at bedtime.  ? traMADol (ULTRAM) 50 MG tablet Take 1 tablet (50 mg total) by mouth 2 (two) times daily as needed for moderate pain.  ? venlafaxine XR (EFFEXOR-XR) 150 MG 24 hr capsule TAKE 1 CAPSULE EVERY DAY WITH BREAKFAST  ? venlafaxine XR (EFFEXOR-XR) 75 MG 24 hr capsule TAKE 1 CAPSULE WITH THE 150 MG VENLAFAXINE CAPSULE  DAILY  ? [DISCONTINUED] cephALEXin (KEFLEX) 500 MG capsule Take 1 capsule (500 mg total) by mouth 4 (four) times daily.  ? ?No facility-administered encounter medications on file as of 10/10/2021.  ? ? ?Allergies as of 10/10/2021 - Review Complete 10/10/2021  ?Allergen Reaction Noted  ? Mobic [meloxicam] Shortness Of Breath 01/17/2019  ? Statins Other (See Comments) 01/17/2019  ? ? ?Past Medical History:  ?Diagnosis Date  ? Acute ataxia 09/30/2019  ? Acute pneumothorax 04/08/2020  ? Atrial fibrillation ([place] 01/17/2019  ? BPH (benign prostatic hyperplasia) 01/17/2019  ? Chronic pulmonary aspiration   ? Degenerative disc disease, lumbar 01/17/2019  ? Depression, major, single episode, complete remission ([place] 01/17/2019  ? Displacement of lumbar intervertebral disc without myelopathy 04/22/2010  ? Diverticulosis 01/17/2019  ? Dysrhythmia   ? Dysrhythmias 12/19/2011  ? Esophageal cancer ([place]   ? GERD (gastroesophageal reflux disease)   ? H/O small bowel obstruction 01/17/2019  ? Hiatal hernia 01/17/2019  ? [facility] 11/22/2019  ? Hypertension   ? ILD (interstitial lung disease) ([place]   ? Inguinal hernia   ? bilateral, left more prominant  ? Left inguinal hernia 03/21/2021  ? Leg swelling 01/17/2019  ? 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[place]   ? 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  ? Pneumonia 11/2019  ? Pneumothorax 04/08/2020  ? SBO (small bowel obstruction) ([place] 11/20/2019  ? Status post left inguinal hernia repair 04/15/2021  ? Wears glasses   ? ? ?Past Surgical History:  ?Procedure Laterality Date  ? ABDOMINAL HERNIA REPAIR  2012, 2014, 2017  ? ventral  ? BRONCHIAL BIOPSY  04/08/2020  ? Procedure: BRONCHIAL BIOPSIES;  Surgeon: Marshell Garfinkel, MD;  Location: [facility];  Service: Cardiopulmonary;;  ? BRONCHIAL BRUSHINGS  04/08/2020  ? Procedure: BRONCHIAL BRUSHINGS;  Surgeon: Marshell Garfinkel, MD;  Location: [facility];  Service: Cardiopulmonary;;  ?  BRONCHIAL WASHINGS  04/08/2020  ? Procedure: BRONCHIAL WASHINGS;  Surgeon: Marshell Garfinkel, MD;  Location: [facility];  Service: Cardiopulmonary;;  ? COLONOSCOPY  2019  ? ESOPHAGECTOMY  1997  ? for esophageal cancer  ?

## 2021-10-10 NOTE — Patient Instructions (Signed)
You have been scheduled for a small bowel follow thru at [facility] . Your appointment is on 10/19/2021 at 10:30. Please arrive 30 minutes prior to your test for registration. Make certain not to have anything to eat or drink starting Midnight on the night before your test. If for some reason you need to reschedule your test, please call radiology at [phone]. ?____________________________________________________________________ ?The Small Bowel Follow Thru examination is used to visualize the entire small bowel (intestines); specifically the connection between the small and large intestine. You will be positioned on a flat x-ray table and an image of your abdomen taken. Then the technologist will show the x-ray to the radiologist. The radiologist will instruct your technologist how much (1-2 cups) barium sulfate you will drink and when to begin taking the timed x-rays, usually 15-30 minutes after you begin drinking. Barium is a harmless substance that will highlight your small intestine by absorbing x-ray. The taste is chalky and it feels very heavy both in the cup and in your stomach.  After the first x-ray is taken and shown to the radiologist, he/she will determine when the next image is to be taken. This is repeated until the barium has reached the end of the small intestine and enters the beginning of the colon (cecum). At such time when the barium spills into the colon, you will be positioned on the x-ray table once again. The radiologist will use a fluoroscopic camera to take some detailed pictures of the connection between your small intestine and colon. The fluoroscope is an x-ray unit that works with a television/computer screen. The radiologist will apply pressure to your abdomen with his/her hand and a lead glove, a plastic paddle, or a paddle with an inflated rubber balloon on the end. This is to spread apart your loops of intestine so he/she can see all areas.  ? ?This test typically  takes around 1 hour to complete. ? ?**Important** Drink plenty of water (8-10 cups/day) for a few days following the procedure to avoid constipation and blockage. The barium will make your stools white for a few days. ?_________ ? ?Gastroesophageal Reflux Disease, Adult ?Gastroesophageal reflux (GER) happens when acid from the stomach flows up into the tube that connects the mouth and the stomach (esophagus). Normally, food travels down the esophagus and stays in the stomach to be digested. However, when a person has GER, food and stomach acid sometimes move back up into the esophagus. If this becomes a more serious problem, the person may be diagnosed with a disease called gastroesophageal reflux disease (GERD). GERD occurs when the reflux: ?Happens often. ?Causes frequent or severe symptoms. ?Causes problems such as damage to the esophagus. ?When stomach acid comes in contact with the esophagus, the acid may cause inflammation in the esophagus. Over time, GERD may create small holes (ulcers) in the lining of the esophagus. ?What are the causes? ?This condition is caused by a problem with the muscle between the esophagus and the stomach (lower esophageal sphincter, or LES). Normally, the LES muscle closes after food passes through the esophagus to the stomach. When the LES is weakened or abnormal, it does not close properly, and that allows food and stomach acid to go back up into the esophagus. ?The LES can be weakened by certain dietary substances, medicines, and medical conditions, including: ?Tobacco use. ?Pregnancy. ?Having a hiatal hernia. ?Alcohol use. ?Certain foods and beverages, such as coffee, chocolate, onions, and peppermint. ?What increases the risk? ?You are more likely to  develop this condition if you: ?Have an increased body weight. ?Have a connective tissue disorder. ?Take NSAIDs, such as ibuprofen. ?What are the signs or symptoms? ?Symptoms of this condition include: ?Heartburn. ?Difficult or  painful swallowing and the feeling of having a lump in the throat. ?A bitter taste in the mouth. ?Bad breath and having a large amount of saliva. ?Having an upset or bloated stomach and belching. ?Chest pain. Different conditions can cause chest pain. Make sure you see your health care provider if you experience chest pain. ?Shortness of breath or wheezing. ?Ongoing (chronic) cough or a nighttime cough. ?Wearing away of tooth enamel. ?Weight loss. ?How is this diagnosed? ?This condition may be diagnosed based on a medical history and a physical exam. To determine if you have mild or severe GERD, your health care provider may also monitor how you respond to treatment. You may also have tests, including: ?A test to examine your stomach and esophagus with a small camera (endoscopy). ?A test that measures the acidity level in your esophagus. ?A test that measures how much pressure is on your esophagus. ?A barium swallow or modified barium swallow test to show the shape, size, and functioning of your esophagus. ?How is this treated? ?Treatment for this condition may vary depending on how severe your symptoms are. Your health care provider may recommend: ?Changes to your diet. ?Medicine. ?Surgery. ?The goal of treatment is to help relieve your symptoms and to prevent complications. ?Follow these instructions at home: ?Eating and drinking ? ?Follow a diet as recommended by your health care provider. This may involve avoiding foods and drinks such as: ?Coffee and tea, with or without caffeine. ?Drinks that contain alcohol. ?Energy drinks and sports drinks. ?Carbonated drinks or sodas. ?Chocolate and cocoa. ?Peppermint and mint flavorings. ?Garlic and onions. ?Horseradish. ?Spicy and acidic foods, including peppers, chili powder, curry powder, vinegar, hot sauces, and barbecue sauce. ?Citrus fruit juices and citrus fruits, such as oranges, lemons, and limes. ?Tomato-based foods, such as red sauce, chili, salsa, and pizza  with red sauce. ?Fried and fatty foods, such as donuts, french fries, potato chips, and high-fat dressings. ?High-fat meats, such as hot dogs and fatty cuts of red and white meats, such as rib eye steak, sausage, ham, and bacon. ?High-fat dairy items, such as whole milk, butter, and cream cheese. ?Eat small, frequent meals instead of large meals. ?Avoid drinking large amounts of liquid with your meals. ?Avoid eating meals during the 2-3 hours before bedtime. ?Avoid lying down right after you eat. ?Do not exercise right after you eat. ?Lifestyle ? ?Do not use any products that contain nicotine or tobacco. These products include cigarettes, chewing tobacco, and vaping devices, such as e-cigarettes. If you need help quitting, ask your health care provider. ?Try to reduce your stress by using methods such as yoga or meditation. If you need help reducing stress, ask your health care provider. ?If you are overweight, reduce your weight to an amount that is healthy for you. Ask your health care provider for guidance about a safe weight loss goal. ?General instructions ?Pay attention to any changes in your symptoms. ?Take over-the-counter and prescription medicines only as told by your health care provider. Do not take aspirin, ibuprofen, or other NSAIDs unless your health care provider told you to take these medicines. ?Wear loose-fitting clothing. Do not wear anything tight around your waist that causes pressure on your abdomen. ?Raise (elevate) the head of your bed about 6 inches (15 cm). You can use a  wedge to do this. ?Avoid bending over if this makes your symptoms worse. ?Keep all follow-up visits. This is important. ?Contact a health care provider if: ?You have: ?New symptoms. ?Unexplained weight loss. ?Difficulty swallowing or it hurts to swallow. ?Wheezing or a persistent cough. ?A hoarse voice. ?Your symptoms do not improve with treatment. ?Get help right away if: ?You have sudden pain in your arms, neck, jaw,  teeth, or back. ?You suddenly feel sweaty, dizzy, or light-headed. ?You have chest pain or shortness of breath. ?You vomit and the vomit is green, yellow, or black, or it looks like blood or coffee grounds. ?You

## 2021-10-19 ENCOUNTER — Ambulatory Visit (HOSPITAL_COMMUNITY)
Admission: RE | Admit: 2021-10-19 | Discharge: 2021-10-19 | Disposition: A | Discharge status: Home / Self Care | Payer: Medicare HMO | Source: Ambulatory Visit | Attending: Gastroenterology | Admitting: Gastroenterology

## 2021-10-19 DIAGNOSIS — Z8719 Personal history of other diseases of the digestive system: Secondary | ICD-10-CM | POA: Insufficient documentation

## 2021-10-19 DIAGNOSIS — R109 Unspecified abdominal pain: Secondary | ICD-10-CM | POA: Insufficient documentation

## 2021-10-19 DIAGNOSIS — R110 Nausea: Secondary | ICD-10-CM | POA: Diagnosis not present

## 2021-10-19 DIAGNOSIS — K311 Adult hypertrophic pyloric stenosis: Secondary | ICD-10-CM | POA: Diagnosis not present

## 2021-10-19 DIAGNOSIS — K3184 Gastroparesis: Secondary | ICD-10-CM | POA: Diagnosis not present

## 2021-10-20 DIAGNOSIS — M5416 Radiculopathy, lumbar region: Secondary | ICD-10-CM | POA: Diagnosis not present

## 2021-10-20 DIAGNOSIS — M5413 Radiculopathy, cervicothoracic region: Secondary | ICD-10-CM | POA: Diagnosis not present

## 2021-10-25 DIAGNOSIS — M545 Low back pain, unspecified: Secondary | ICD-10-CM | POA: Diagnosis not present

## 2021-10-25 DIAGNOSIS — M5416 Radiculopathy, lumbar region: Secondary | ICD-10-CM | POA: Diagnosis not present

## 2021-10-30 ENCOUNTER — Other Ambulatory Visit: Payer: Self-pay | Admitting: Gastroenterology

## 2021-11-07 DIAGNOSIS — J849 Interstitial pulmonary disease, unspecified: Secondary | ICD-10-CM | POA: Diagnosis not present

## 2021-11-07 DIAGNOSIS — K219 Gastro-esophageal reflux disease without esophagitis: Secondary | ICD-10-CM | POA: Diagnosis not present

## 2021-11-07 DIAGNOSIS — Z9049 Acquired absence of other specified parts of digestive tract: Secondary | ICD-10-CM | POA: Diagnosis not present

## 2021-11-07 DIAGNOSIS — T17908D Unspecified foreign body in respiratory tract, part unspecified causing other injury, subsequent encounter: Secondary | ICD-10-CM | POA: Diagnosis not present

## 2021-11-07 DIAGNOSIS — R0602 Shortness of breath: Secondary | ICD-10-CM | POA: Diagnosis not present

## 2021-11-07 DIAGNOSIS — K449 Diaphragmatic hernia without obstruction or gangrene: Secondary | ICD-10-CM | POA: Diagnosis not present

## 2021-11-07 DIAGNOSIS — Z8719 Personal history of other diseases of the digestive system: Secondary | ICD-10-CM | POA: Diagnosis not present

## 2021-11-07 DIAGNOSIS — I4891 Unspecified atrial fibrillation: Secondary | ICD-10-CM | POA: Diagnosis not present

## 2021-11-07 DIAGNOSIS — Z7901 Long term (current) use of anticoagulants: Secondary | ICD-10-CM | POA: Diagnosis not present

## 2021-11-08 DIAGNOSIS — Z6828 Body mass index (BMI) 28.0-28.9, adult: Secondary | ICD-10-CM | POA: Diagnosis not present

## 2021-11-08 DIAGNOSIS — M5416 Radiculopathy, lumbar region: Secondary | ICD-10-CM | POA: Diagnosis not present

## 2021-11-17 DIAGNOSIS — Z7901 Long term (current) use of anticoagulants: Secondary | ICD-10-CM | POA: Diagnosis not present

## 2021-11-17 DIAGNOSIS — K449 Diaphragmatic hernia without obstruction or gangrene: Secondary | ICD-10-CM | POA: Diagnosis not present

## 2021-11-17 DIAGNOSIS — I4891 Unspecified atrial fibrillation: Secondary | ICD-10-CM | POA: Diagnosis not present

## 2021-11-17 DIAGNOSIS — K432 Incisional hernia without obstruction or gangrene: Secondary | ICD-10-CM | POA: Diagnosis not present

## 2021-11-17 DIAGNOSIS — T17908D Unspecified foreign body in respiratory tract, part unspecified causing other injury, subsequent encounter: Secondary | ICD-10-CM | POA: Diagnosis not present

## 2021-11-17 DIAGNOSIS — C155 Malignant neoplasm of lower third of esophagus: Secondary | ICD-10-CM | POA: Diagnosis not present

## 2021-11-23 ENCOUNTER — Telehealth: Payer: Self-pay

## 2021-11-23 DIAGNOSIS — I700 Atherosclerosis of aorta: Secondary | ICD-10-CM

## 2021-11-23 NOTE — Telephone Encounter (Signed)
   Pre-operative Risk Assessment    Patient Name: Thomas Dominguez. Swiger  DOB: [birth year] MRN: [id number]      Request for Surgical Clearance    Procedure:   ESI Left L1-L2  Date of Surgery:  Clearance 12/01/21                                 Surgeon:  Dr. Davy Pique Surgeon's Group or Practice Name:  [facility] Phone number:  [phone] Fax number:  [phone]   Type of Clearance Requested:   - Medical  - Pharmacy:  Hold Apixaban (Eliquis) 3 days prior to and restart after   Type of Anesthesia:  Not Indicated   Additional requests/questions:    Tana Conch   11/23/2021, 5:03 PM

## 2021-11-24 DIAGNOSIS — I700 Atherosclerosis of aorta: Secondary | ICD-10-CM | POA: Insufficient documentation

## 2021-11-24 NOTE — Telephone Encounter (Signed)
Patient with diagnosis of Afib on Eliquis for anticoagulation.    Procedure: Epidural steroid injection Date of procedure: 12/01/21   CHA2DS2-VASc Score = 4  This indicates a 4.8% annual risk of stroke. The patient's score is based upon: CHF History: 0 HTN History: 1 Diabetes History: 0 Stroke History: 0 Vascular Disease History: 1 Age Score: 2 Gender Score: 0  PMH of aortic atherosclerosis noted on CT 03/08/2021. PMH updated.  CrCl 79.6 using IBW and 97.0 using ABW Platelet count 177K  Per office protocol, patient can hold Eliquis for 3 days prior to procedure.

## 2021-11-24 NOTE — Telephone Encounter (Signed)
Clinical pharmacist to review Eliquis 

## 2021-11-25 NOTE — Telephone Encounter (Signed)
   Name: Thomas Dominguez  DOB: Jul 22, 1945  MRN: [id number]   Primary Cardiologist: Donato Heinz, MD  Chart reviewed as part of pre-operative protocol coverage. Patient was contacted 11/25/2021 in reference to pre-operative risk assessment for pending surgery as outlined below.  Thomas Dominguez was last seen on 08/01/2021 by Dr. Gardiner Rhyme.  Since that day, Thomas Dominguez has done well without exertional chest pain or worsening dyspnea.  Therefore, based on ACC/AHA guidelines, the patient would be at acceptable risk for the planned procedure without further cardiovascular testing.   He may hold Eliquis for 3 days prior to the procedure and restart as soon as possible afterward at the surgeon's discretion.  The patient was advised that if he develops new symptoms prior to surgery to contact our office to arrange for a follow-up visit, and he verbalized understanding.  I will route this recommendation to the requesting party via Epic fax function and remove from pre-op pool. Please call with questions.  [place], [place] 11/25/2021, 4:57 PM

## 2021-11-29 ENCOUNTER — Encounter: Payer: Self-pay | Admitting: *Deleted

## 2021-12-01 DIAGNOSIS — M5416 Radiculopathy, lumbar region: Secondary | ICD-10-CM | POA: Diagnosis not present

## 2021-12-07 ENCOUNTER — Other Ambulatory Visit: Payer: Self-pay | Admitting: Cardiology

## 2021-12-08 ENCOUNTER — Other Ambulatory Visit: Payer: Self-pay | Admitting: Family Medicine

## 2021-12-08 DIAGNOSIS — F339 Major depressive disorder, recurrent, unspecified: Secondary | ICD-10-CM

## 2021-12-13 DIAGNOSIS — T17908D Unspecified foreign body in respiratory tract, part unspecified causing other injury, subsequent encounter: Secondary | ICD-10-CM | POA: Diagnosis not present

## 2021-12-13 DIAGNOSIS — C159 Malignant neoplasm of esophagus, unspecified: Secondary | ICD-10-CM | POA: Diagnosis not present

## 2021-12-13 DIAGNOSIS — Z01818 Encounter for other preprocedural examination: Secondary | ICD-10-CM | POA: Diagnosis not present

## 2021-12-17 ENCOUNTER — Other Ambulatory Visit: Payer: Self-pay | Admitting: Gastroenterology

## 2021-12-19 DIAGNOSIS — K30 Functional dyspepsia: Secondary | ICD-10-CM | POA: Diagnosis not present

## 2021-12-19 DIAGNOSIS — I480 Paroxysmal atrial fibrillation: Secondary | ICD-10-CM | POA: Diagnosis not present

## 2021-12-19 DIAGNOSIS — Z9049 Acquired absence of other specified parts of digestive tract: Secondary | ICD-10-CM | POA: Diagnosis not present

## 2021-12-19 DIAGNOSIS — Z8501 Personal history of malignant neoplasm of esophagus: Secondary | ICD-10-CM | POA: Diagnosis not present

## 2021-12-19 DIAGNOSIS — K219 Gastro-esophageal reflux disease without esophagitis: Secondary | ICD-10-CM | POA: Diagnosis not present

## 2021-12-19 DIAGNOSIS — K311 Adult hypertrophic pyloric stenosis: Secondary | ICD-10-CM | POA: Diagnosis not present

## 2021-12-21 ENCOUNTER — Other Ambulatory Visit: Payer: Self-pay | Admitting: *Deleted

## 2021-12-21 ENCOUNTER — Encounter: Payer: Self-pay | Admitting: Student

## 2021-12-22 DIAGNOSIS — M5416 Radiculopathy, lumbar region: Secondary | ICD-10-CM | POA: Diagnosis not present

## 2021-12-22 DIAGNOSIS — Z6828 Body mass index (BMI) 28.0-28.9, adult: Secondary | ICD-10-CM | POA: Diagnosis not present

## 2021-12-22 MED ORDER — TAMSULOSIN HCL 0.4 MG PO CAPS: 0.8000 mg | ORAL_CAPSULE | Freq: Every day | ORAL | 1 refills | Status: DC

## 2021-12-27 ENCOUNTER — Encounter: Payer: Self-pay | Admitting: Cardiology

## 2021-12-27 DIAGNOSIS — I480 Paroxysmal atrial fibrillation: Secondary | ICD-10-CM

## 2022-01-09 ENCOUNTER — Other Ambulatory Visit: Payer: Self-pay | Admitting: Family Medicine

## 2022-01-09 DIAGNOSIS — M5136 Other intervertebral disc degeneration, lumbar region: Secondary | ICD-10-CM

## 2022-01-10 ENCOUNTER — Other Ambulatory Visit: Payer: Self-pay | Admitting: *Deleted

## 2022-01-11 MED ORDER — VENLAFAXINE HCL ER 150 MG PO CP24: ORAL_CAPSULE | ORAL | 3 refills | Status: DC

## 2022-01-31 DIAGNOSIS — D225 Melanocytic nevi of trunk: Secondary | ICD-10-CM | POA: Diagnosis not present

## 2022-01-31 DIAGNOSIS — Z85820 Personal history of malignant melanoma of skin: Secondary | ICD-10-CM | POA: Diagnosis not present

## 2022-01-31 DIAGNOSIS — L821 Other seborrheic keratosis: Secondary | ICD-10-CM | POA: Diagnosis not present

## 2022-01-31 DIAGNOSIS — L570 Actinic keratosis: Secondary | ICD-10-CM | POA: Diagnosis not present

## 2022-01-31 DIAGNOSIS — D692 Other nonthrombocytopenic purpura: Secondary | ICD-10-CM | POA: Diagnosis not present

## 2022-02-08 ENCOUNTER — Ambulatory Visit: Payer: Medicare HMO | Admitting: Cardiology

## 2022-02-08 VITALS — BP 116/66 | HR 103 | Ht 72.0 in | Wt 210.0 lb

## 2022-02-08 DIAGNOSIS — I480 Paroxysmal atrial fibrillation: Secondary | ICD-10-CM | POA: Diagnosis not present

## 2022-02-08 DIAGNOSIS — I700 Atherosclerosis of aorta: Secondary | ICD-10-CM

## 2022-02-08 NOTE — Progress Notes (Signed)
Electrophysiology Office Note:    Date:  02/08/2022   ID:  Thomas Dominguez, [place] [birth year], MRN [id number]  PCP:  Wells Guiles, DO  [place] Cardiologist:  Donato Heinz, MD  [place] Electrophysiologist:  None   Referring MD: Wells Guiles, DO   Chief Complaint: Fibrillation  History of Present Illness:    Thomas Dominguez. [name] is a 76 y.o. male who presents for an evaluation of atrial fibrillation at the request of Dr. Gardiner Rhyme. Their medical history includes atrial fibrillation, esophageal cancer post esophagectomy, BPH, depression, degenerative disc disease.  The patient was last seen by Dr. Gardiner Rhyme in person on January 17, 2021.  He is on propafenone for rhythm control and takes Eliquis for stroke prophylaxis.  He has trouble with bleeding while on the blood thinners and is interested in a stroke risk mitigation strategy that avoids long-term exposure to anticoagulation.  Patient is interested in stopping Eliquis, which could be replaced by the watchman procedure.   The patient has a history of esophagus cancer, and he has had an esophagectomy. He has had difficulty before with camera insertion through the mouth due to this procedure. He has significant scar tissue in the area.    Past Medical History:  Diagnosis Date   Acute ataxia 09/30/2019   Acute pneumothorax 04/08/2020   Atrial fibrillation ([place]) 01/17/2019   BPH (benign prostatic hyperplasia) 01/17/2019   Chronic pulmonary aspiration    Degenerative disc disease, lumbar 01/17/2019   Depression, major, single episode, complete remission ([place]) 01/17/2019   Displacement of lumbar intervertebral disc without myelopathy 04/22/2010   Diverticulosis 01/17/2019   Dysrhythmia    Dysrhythmias 12/19/2011   Esophageal cancer (HCC)    GERD (gastroesophageal reflux disease)    H/O small bowel obstruction 01/17/2019   Hiatal hernia 01/17/2019   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  of cancer 11/22/2019   Hypertension    ILD (interstitial lung disease) ([place])    Inguinal hernia    bilateral, left more prominant   Left inguinal hernia 03/21/2021   Leg swelling 01/17/2019   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[place])    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   Pneumonia 11/2019   Pneumothorax 04/08/2020   SBO (small bowel obstruction) ([place]) 11/20/2019   Status post left inguinal hernia repair 04/15/2021   Wears glasses     Past Surgical History:  Procedure Laterality Date   ABDOMINAL HERNIA REPAIR  2012, 2014, 2017   ventral   BRONCHIAL BIOPSY  04/08/2020   Procedure: BRONCHIAL BIOPSIES;  Surgeon: Marshell Garfinkel, MD;  Location: [facility];  Service: Cardiopulmonary;;   BRONCHIAL BRUSHINGS  04/08/2020   Procedure: BRONCHIAL BRUSHINGS;  Surgeon: Marshell Garfinkel, MD;  Location: [facility];  Service: Cardiopulmonary;;   BRONCHIAL WASHINGS  04/08/2020   Procedure: BRONCHIAL WASHINGS;  Surgeon: Marshell Garfinkel, MD;  Location: [facility];  Service: Cardiopulmonary;;   COLONOSCOPY  2019   ESOPHAGECTOMY  1997   for esophageal cancer   ESOPHAGOGASTRODUODENOSCOPY (EGD) WITH PROPOFOL N/A 06/11/2020   Procedure: ESOPHAGOGASTRODUODENOSCOPY (EGD) WITH PROPOFOL;  Surgeon: Mauri Pole, MD;  Location: [facility];  Service: Endoscopy;  Laterality: N/A;   INGUINAL HERNIA REPAIR Left 03/22/2021   Procedure: OPEN REPAIR LEFT INGUINAL HERNIA WITH MESH;  Surgeon: [name], MD;  Location: [facility];  Service: General;  Laterality: Left;   PARATHYROIDECTOMY Right 01/26/2020   Procedure: RIGHT INFERIOR PARATHYROIDECTOMY;  Surgeon: [name], MD;  Location: [facility];  Service: General;  Laterality: Right;  SPINAL FUSION  2018, 2020   L2 L5 in 2018, L5-S1 2020   VIDEO BRONCHOSCOPY N/A 04/08/2020   Procedure: VIDEO BRONCHOSCOPY WITH FLUORO;  Surgeon: Marshell Garfinkel, MD;  Location: [facility];  Service: Cardiopulmonary;  Laterality: N/A;    Current Medications: No outpatient medications  have been marked as taking for the 02/08/22 encounter (Appointment) with Vickie Epley, MD.     Allergies:   Mobic [meloxicam] and Statins   Social History   Socioeconomic History   Marital status: Married    Spouse name: [name]   Number of children: 2   Years of education: 16   Highest education level: Bachelor's degree (e.g., BA, AB, BS)  Occupational History   Occupation: retired    Fish farm manager: [employer]  Tobacco Use   Smoking status: Never   Smokeless tobacco: Never  Vaping Use   Vaping Use: Never used  Substance and Sexual Activity   Alcohol use: Not Currently    Alcohol/week: 7.0 standard drinks of alcohol    Types: 7 Cans of beer per week    Comment: Stopped a year ago   Drug use: Never   Sexual activity: Yes  Other Topics Concern   Not on file  Social History Narrative   Patient lives with his wife in [place].    Patient has one son in [place] and one daughter in [place].    Patient walks daily at local parks/trails around [place].    Patient has one dog.    Social Determinants of Health   Financial Resource Strain: Low Risk  (04/07/2021)   Overall Financial Resource Strain (CARDIA)    Difficulty of Paying Living Expenses: Not hard at all  Food Insecurity: No Food Insecurity (04/07/2021)   Hunger Vital Sign    Worried About Running Out of Food in the Last Year: Never true    Ran Out of Food in the Last Year: Never true  Transportation Needs: No Transportation Needs (04/07/2021)   PRAPARE - Hydrologist (Medical): No    Lack of Transportation (Non-Medical): No  Physical Activity: Sufficiently Active (04/07/2021)   Exercise Vital Sign    Days of Exercise per Week: 6 days    Minutes of Exercise per Session: 40 min  Stress: No Stress Concern Present (04/07/2021)   [place]    Feeling of Stress : Not at all  Social Connections: [place] (04/07/2021)    Social Connection and Isolation Panel [NHANES]    Frequency of Communication with Friends and Family: More than three times a week    Frequency of Social Gatherings with Friends and Family: More than three times a week    Attends Religious Services: More than 4 times per year    Active Member of [employer] or Organizations: Yes    Attends Music therapist: More than 4 times per year    Marital Status: Married     Family History: The patient's family history includes Emphysema in his father; GER disease in his daughter, mother, and son; Lung cancer in his sister. There is no history of Colon cancer, Stomach cancer, or Esophageal cancer.  ROS:   Please see the history of present illness.     No pertinent symptoms discussed.  EKGs/Labs/Other Studies Reviewed:    The following studies were reviewed today:  Echocardiogram 01/14/21:  IMPRESSIONS     1. Left ventricular ejection fraction, by estimation, is 60 to 65%. The  left ventricle has normal function. The left ventricle has no regional  wall motion abnormalities. Left ventricular diastolic parameters were  normal. The average left ventricular  global longitudinal strain is -21.3 %. The global longitudinal strain is  normal.   2. Right ventricular systolic function is normal. The right ventricular  size is normal. There is normal pulmonary artery systolic pressure.   3. The mitral valve is normal in structure. Trivial mitral valve  regurgitation. No evidence of mitral stenosis.   4. The aortic valve is tricuspid. There is mild calcification of the  aortic valve. Aortic valve regurgitation is trivial. Mild aortic valve  sclerosis is present, with no evidence of aortic valve stenosis.   5. The inferior vena cava is normal in size with greater than 50%  respiratory variability, suggesting right atrial pressure of 3 mmHg.   Comparison(s): 04/02/19 EF 55-60%.   CT Cardiac Scoring 03/12/20:  FINDINGS: Non-cardiac: See  separate report from [facility].   Ascending Aorta: 32 mm at the mid ascending aorta measured in an axial plane.   Pericardium: Normal   Coronary arteries   Coronary calcium score of 0.  Normal coronary artery origins.   IMPRESSION: Coronary calcium score of 0.   Long Term Monitor 04/21/2019:  No atrial fibrillation seen. 20 episodes of SVT, longest lasting 11 seconds.   13 days of data recorded on Zio monitor. Patient had a min HR of 59 bpm, max HR of 174 bpm, and avg HR of 87 bpm. Predominant underlying rhythm was Sinus Rhythm. No VT, atrial fibrillation, high degree block, or pauses noted. Isolated atrial and ventricular ectopy was rare (<1%). 20 episodes of of SVT, longest lasting 11 seconds (rate 144 bpm); fastest was rate 174 bpm, lasting 12 beats.There were 8 triggered events, which corresponded to sinus rhythm and rare PVCs. No significant arrhythmias detected.  Echocardiogram 04/02/2019:  IMPRESSIONS  1. Left ventricular ejection fraction, by visual estimation, is 55 to  60%. The left ventricle has normal function. There is no left ventricular  hypertrophy.   2. Left ventricular diastolic Doppler parameters are consistent with  pseudonormalization pattern of LV diastolic filling.   3. Global right ventricle has normal systolic function.The right  ventricular size is mildly enlarged. No increase in right ventricular wall  thickness.   4. Left atrial size was normal.   5. Right atrial size was normal.   6. The mitral valve is normal in structure. No evidence of mitral valve  regurgitation. No evidence of mitral stenosis.   7. The tricuspid valve is normal in structure. Tricuspid valve  regurgitation is trivial.   8. The aortic valve is normal in structure. Aortic valve regurgitation  was not visualized by color flow Doppler.   9. The pulmonic valve was normal in structure. Pulmonic valve  regurgitation is not visualized by color flow Doppler.  10. Normal  pulmonary artery systolic pressure.  11. The atrial septum is grossly normal.   Lower Extremity Venous Doppler 01/16/2019:  Summary:  Right: No evidence of common femoral vein obstruction.  Left: There is no evidence of deep vein thrombosis in the lower extremity.  No cystic structure found in the popliteal fossa.  EKG:  No EKG ordered today   Recent Labs: 03/16/2021: BUN 14; Creatinine, Ser 0.88; Hemoglobin 16.0; Platelets 177; Potassium 4.6; Sodium 137  Recent Lipid Panel    Component Value Date/Time   CHOL 186 12/26/2019 0946   TRIG 176 (H) 12/26/2019 0946   HDL 67 12/26/2019 0946  CHOLHDL 2.8 12/26/2019 0946   LDLCALC 89 12/26/2019 0946    Physical Exam:    VS:  There were no vitals taken for this visit.    Wt Readings from Last 3 Encounters:  10/10/21 209 lb 9.6 oz (95.1 kg)  08/01/21 212 lb 6.4 oz (96.3 kg)  06/10/21 210 lb 9.6 oz (95.5 kg)     GEN:  Well nourished, well developed in no acute distress HEENT: Normal NECK: No JVD; No carotid bruits LYMPHATICS: No lymphadenopathy CARDIAC: RRR, no murmurs, rubs, gallops RESPIRATORY:  Clear to auscultation without rales, wheezing or rhonchi  ABDOMEN: Soft, non-tender, non-distended MUSCULOSKELETAL:  No edema; No deformity  SKIN: Warm and dry NEUROLOGIC:  Alert and oriented x 3 PSYCHIATRIC:  Normal affect       ASSESSMENT:    1. PAF (paroxysmal atrial fibrillation) ([place])   2. Aortic atherosclerosis ([place])    PLAN:    In order of problems listed above:  #pAF On Eliquis and propafenone.  Referred to discuss left atrial appendage occlusion as a mechanism to avoid long-term exposure anticoagulation.  Given the patient's history of esophagectomy, he is a poor candidate for left atrial appendage occlusion utilizing transesophageal echo.  I did spend time today discussing the possibility of using intracardiac echo in the future to guide watchman implant and he would be interested.  We will keep him on our list of  possible future candidates for left atrial appendage occlusion utilizing 4-dimensional ICE.  For now, continue Eliquis for stroke risk reduction.     ----------------     I have seen Thomas Dominguez in the office today who is being considered for a Watchman left atrial appendage closure device. I believe they will benefit from this procedure given their history of atrial fibrillation, CHA2DS2-VASc score of 4 and unadjusted ischemic stroke rate of 4.2% per year. The patient's chart has been reviewed and I feel that they would be a candidate for short term oral anticoagulation after Watchman implant.    It is my belief that after undergoing a LAA closure procedure, Orlondo L. Wendland will not need long term anticoagulation which eliminates anticoagulation side effects and major bleeding risk.    Procedural risks for the Watchman implant have been reviewed with the patient including a 0.5% risk of stroke, <1% risk of perforation and <1% risk of device embolization. Other risks include bleeding, vascular damage, tamponade, worsening renal function, and death. The patient understands these risks.       The published clinical data on the safety and effectiveness of WATCHMAN include but are not limited to the following: - Holmes DR, Mechele Claude, Sick P et al. for the PROTECT AF Investigators. Percutaneous closure of the left atrial appendage versus warfarin therapy for prevention of stroke in patients with atrial fibrillation: a randomised non-inferiority trial. Lancet 2009; 374: 534-42. Mechele Claude, Doshi SK, Abelardo Diesel D et al. on behalf of the PROTECT AF Investigators. Percutaneous Left Atrial Appendage Closure for Stroke Prophylaxis in Patients With Atrial Fibrillation 2.3-Year Follow-up of the PROTECT AF (Watchman Left Atrial Appendage System for Embolic Protection in Patients With Atrial Fibrillation) Trial. Circulation 2013; 127:720-729. - Alli O, Doshi S,  Kar S, Reddy VY, Sievert H et al. Quality  of Life Assessment in the Randomized PROTECT AF (Percutaneous Closure of the Left Atrial Appendage Versus Warfarin Therapy for Prevention of Stroke in Patients With Atrial Fibrillation) Trial of Patients at Risk for Stroke With Nonvalvular Atrial Fibrillation. J Am Coll Cardiol  2013; 61:6073-7. - [place], Tarri Abernethy, Norton Blizzard, [place], Sievert H, Doshi Jolene Schimke V. Prospective randomized evaluation of the Watchman left atrial appendage Device in patients with atrial fibrillation versus long-term warfarin therapy; the PREVAIL trial. Journal of the [employer] of Cardiology, Vol. 4, No. 1, 2014, 1-11. - Kar S, Doshi SK, Sadhu A, Horton R, Osorio J et al. Primary outcome evaluation of a next-generation left atrial appendage closure device: results from the PINNACLE FLX trial. Circulation 2021;143(18)1754-1762.    If the patient were to eventually proceed with watchman implant utilizing intracardiac echo guidance, would need CT scan time for visualization of left atrial appendage.     HAS-BLED score 2 Hypertension Yes  Abnormal renal and liver function (Dialysis, transplant, Cr >2.26 mg/dL /Cirrhosis or Bilirubin >2x Normal or AST/ALT/AP >3x Normal) No  Stroke No  Bleeding No  Labile INR (Unstable/high INR) No  Elderly (>65) Yes  Drugs or alcohol (? 8 drinks/week, anti-plt or NSAID) No    CHA2DS2-VASc Score = 4  The patient's score is based upon: CHF History: 0 HTN History: 1 Diabetes History: 0 Stroke History: 0 Vascular Disease History: 1 Age Score: 2 Gender Score: 0      Medication Adjustments/Labs and Tests Ordered: Current medicines are reviewed at length with the patient today.  Concerns regarding medicines are outlined above.  No orders of the defined types were placed in this encounter.  No orders of the defined types were placed in this encounter.   I,Mary Mosetta Pigeon Buren,acting as a scribe for Vickie Epley, MD.,have documented all relevant documentation  on the behalf of Vickie Epley, MD,as directed by  Vickie Epley, MD while in the presence of Vickie Epley, MD.   I, Vickie Epley, MD, have reviewed all documentation for this visit. The documentation on 02/08/22 for the exam, diagnosis, procedures, and orders are all accurate and complete.   Signed, Hilton Cork. Quentin Ore, MD, [facility], [facility] 02/08/2022 1:52 PM    Electrophysiology [facility]

## 2022-02-08 NOTE — Patient Instructions (Signed)
Medication Instructions:  none *If you need a refill on your cardiac medications before your next appointment, please call your pharmacy*   Lab Work: none If you have labs (blood work) drawn today and your tests are completely normal, you will receive your results only by: [place] (if you have MyChart) OR A paper copy in the mail If you have any lab test that is abnormal or we need to change your treatment, we will call you to review the results.   Testing/Procedures: Your physician has requested that you have Left atrial appendage (LAA) closure device implantation is a procedure to put a small device in the LAA of the heart. The LAA is a small sac in the wall of the heart's left upper chamber. Blood clots can form in this area. The device, Watchman closes the LAA to help prevent a blood clot and stroke.  Follow-Up: At [facility], you and your health needs are our priority.  As part of our continuing mission to provide you with exceptional heart care, we have created designated Provider Care Teams.  These Care Teams include your primary Cardiologist (physician) and Advanced Practice Providers (APPs -  Physician Assistants and Nurse Practitioners) who all work together to provide you with the care you need, when you need it.  We recommend signing up for the patient portal called "MyChart".  Sign up information is provided on this After Visit Summary.  MyChart is used to connect with patients for Virtual Visits (Telemedicine).  Patients are able to view lab/test results, encounter notes, upcoming appointments, etc.  Non-urgent messages can be sent to your provider as well.   To learn more about what you can do with MyChart, go to [email or web address].    Your next appointment:   As needed   Important Information About [place]

## 2022-02-08 NOTE — Progress Notes (Deleted)
Electrophysiology Office Note:    Date:  02/08/2022   ID:  Thomas Dominguez, Debarr 1945/09/28, MRN [id number]  PCP:  Wells Guiles, [place] Cardiologist:  Donato Heinz, MD  [place] Electrophysiologist:  Vickie Epley, MD   Referring MD: Wells Guiles, DO   Chief Complaint: Fibrillation  History of Present Illness:    Thomas Dominguez is a 76 y.o. male who presents for an evaluation of atrial fibrillation at the request of Dr. Gardiner Rhyme. Their medical history includes atrial fibrillation, esophageal cancer post esophagectomy, BPH, depression, degenerative disc disease.  The patient was last seen by Dr. Gardiner Rhyme in person on January 17, 2021.  He is on propafenone for rhythm control and takes Eliquis for stroke prophylaxis.  He has trouble with bleeding while on the blood thinners and is interested in a stroke risk mitigation strategy that avoids long-term exposure to anticoagulation.     Past Medical History:  Diagnosis Date   Acute ataxia 09/30/2019   Acute pneumothorax 04/08/2020   Atrial fibrillation ([place]) 01/17/2019   BPH (benign prostatic hyperplasia) 01/17/2019   Chronic pulmonary aspiration    Degenerative disc disease, lumbar 01/17/2019   Depression, major, single episode, complete remission ([place]) 01/17/2019   Displacement of lumbar intervertebral disc without myelopathy 04/22/2010   Diverticulosis 01/17/2019   Dysrhythmia    Dysrhythmias 12/19/2011   Esophageal cancer (HCC)    GERD (gastroesophageal reflux disease)    H/O small bowel obstruction 01/17/2019   Hiatal hernia 01/17/2019   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 with microfocus of cancer 11/22/2019   Hypertension    ILD (interstitial lung disease) ([place])    Inguinal hernia    bilateral, left more prominant   Left inguinal hernia 03/21/2021   Leg swelling 01/17/2019   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[place])    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   Pneumonia 11/2019    Pneumothorax 04/08/2020   SBO (small bowel obstruction) ([place]) 11/20/2019   Status post left inguinal hernia repair 04/15/2021   Wears glasses     Past Surgical History:  Procedure Laterality Date   ABDOMINAL HERNIA REPAIR  2012, 2014, 2017   ventral   BRONCHIAL BIOPSY  04/08/2020   Procedure: BRONCHIAL BIOPSIES;  Surgeon: Marshell Garfinkel, MD;  Location: [facility];  Service: Cardiopulmonary;;   BRONCHIAL BRUSHINGS  04/08/2020   Procedure: BRONCHIAL BRUSHINGS;  Surgeon: Marshell Garfinkel, MD;  Location: [facility];  Service: Cardiopulmonary;;   BRONCHIAL WASHINGS  04/08/2020   Procedure: BRONCHIAL WASHINGS;  Surgeon: Marshell Garfinkel, MD;  [facility]: [facility];  Service: Cardiopulmonary;;   COLONOSCOPY  2019   ESOPHAGECTOMY  1997   for esophageal cancer   ESOPHAGOGASTRODUODENOSCOPY (EGD) WITH PROPOFOL N/A 06/11/2020   Procedure: ESOPHAGOGASTRODUODENOSCOPY (EGD) WITH PROPOFOL;  Surgeon: Mauri Pole, MD;  [facility];  Service: Endoscopy;  Laterality: N/A;   INGUINAL HERNIA REPAIR Left 03/22/2021   Procedure: OPEN REPAIR LEFT INGUINAL HERNIA WITH MESH;  Surgeon: [name], MD;  Location: [facility];  Service: General;  Laterality: Left;   PARATHYROIDECTOMY Right 01/26/2020   Procedure: RIGHT INFERIOR PARATHYROIDECTOMY;  Surgeon: [name], MD;  Location: [facility];  Service: General;  Laterality: Right;   SPINAL FUSION  2018, 2020   L2 L5 in 2018, L5-S1 2020   VIDEO BRONCHOSCOPY N/A 04/08/2020   Procedure: VIDEO BRONCHOSCOPY WITH FLUORO;  Surgeon: Marshell Garfinkel, MD;  [facility];  Service: Cardiopulmonary;  Laterality: N/A;    Current Medications: Current Meds  Medication  Sig   acetaminophen (TYLENOL) 325 MG tablet Take 650 mg by mouth every 6 (six) hours as needed for moderate pain.   apixaban (ELIQUIS) 5 MG TABS tablet Take 5 mg by mouth 2 (two) times daily.   famotidine (PEPCID) 20 MG tablet TAKE 1 TABLET AT BEDTIME   Hyoscyamine Sulfate SL  (LEVSIN/SL) 0.125 MG SUBL Place 1 tablet under the tongue 2 (two) times daily as needed.   metoprolol succinate (TOPROL-XL) 25 MG 24 hr tablet TAKE 1 TABLET (25 MG TOTAL) BY MOUTH DAILY. (STOP LOPRESSOR)   Multiple Vitamins-Minerals (CENTRUM SILVER 50+MEN) TABS Take 1 tablet by mouth daily.   omeprazole (PRILOSEC) 40 MG capsule TAKE 1 CAPSULE TWICE DAILY   ondansetron (ZOFRAN) 4 MG tablet TAKE 1 TABLET EVERY EIGHT HOURS AS NEEDED FOR NAUSEA OR VOMITING.   tamsulosin (FLOMAX) 0.4 MG CAPS capsule Take 2 capsules (0.8 mg total) by mouth daily.   traMADol (ULTRAM) 50 MG tablet TAKE 1 TABLET (50 MG TOTAL) BY MOUTH 2 (TWO) TIMES DAILY AS NEEDED FOR MODERATE PAIN.   venlafaxine XR (EFFEXOR-XR) 150 MG 24 hr capsule TAKE 1 CAPSULE EVERY DAY WITH BREAKFAST   venlafaxine XR (EFFEXOR-XR) 75 MG 24 hr capsule TAKE 1 CAPSULE DAILY (WITH THE '150MG'$  VENLAFAXINE CAPSULE)     Allergies:   Mobic [meloxicam] and Statins   Social History   Socioeconomic History   Marital status: Married    Spouse name: [name]   Number of children: 2   Years of education: 16   Highest education level: Bachelor's degree (e.g., BA, AB, BS)  Occupational History   Occupation: retired    Fish farm manager: [employer]  Tobacco Use   Smoking status: Never   Smokeless tobacco: Never  Vaping Use   Vaping Use: Never used  Substance and Sexual Activity   Alcohol use: Not Currently    Alcohol/week: 7.0 standard drinks of alcohol    Types: 7 Cans of beer per week    Comment: Stopped a year ago   Drug use: Never   Sexual activity: Yes  Other Topics Concern   Not on file  Social History Narrative   Patient lives with his wife in [place].    Patient has one son in [place] and one daughter in [place].    Patient walks daily at local parks/trails around [place].    Patient has one dog.    Social Determinants of Health   Financial Resource Strain: Low Risk  (04/07/2021)   Overall Financial Resource Strain (CARDIA)    Difficulty of Paying  Living Expenses: Not hard at all  Food Insecurity: No Food Insecurity (04/07/2021)   Hunger Vital Sign    Worried About Running Out of Food in the Last Year: Never true    Ran Out of Food in the Last Year: Never true  Transportation Needs: No Transportation Needs (04/07/2021)   PRAPARE - Hydrologist (Medical): No    Lack of Transportation (Non-Medical): No  Physical Activity: Sufficiently Active (04/07/2021)   Exercise Vital Sign    Days of Exercise per Week: 6 days    Minutes of Exercise per Session: 40 min  Stress: No Stress Concern Present (04/07/2021)   [place]    Feeling of Stress : Not at all  Social Connections: [place] (04/07/2021)   Social Connection and Isolation Panel [NHANES]    Frequency of Communication with Friends and Family: More than three times a week  Frequency of Social Gatherings with Friends and Family: More than three times a week    Attends Religious Services: More than 4 times per year    Active Member of [employer] or Organizations: Yes    Attends Music therapist: More than 4 times per year    Marital Status: Married     Family History: The patient's family history includes Emphysema in his father; GER disease in his daughter, mother, and son; Lung cancer in his sister. There is no history of Colon cancer, Stomach cancer, or Esophageal cancer.  ROS:   Please see the history of present illness.    All other systems reviewed and are negative.  EKGs/Labs/Other Studies Reviewed:    The following studies were reviewed today:  March 08, 2021 CT chest Post esophagectomy with gastric pull-through     Recent Labs: 03/16/2021: BUN 14; Creatinine, Ser 0.88; Hemoglobin 16.0; Platelets 177; Potassium 4.6; Sodium 137  Recent Lipid Panel    Component Value Date/Time   CHOL 186 12/26/2019 0946   TRIG 176 (H) 12/26/2019 0946   HDL 67  12/26/2019 0946   CHOLHDL 2.8 12/26/2019 0946   LDLCALC 89 12/26/2019 0946    Physical Exam:    VS:  BP 116/66   Pulse (!) 103   Ht 6' (1.829 m)   Wt 210 lb (95.3 kg)   SpO2 97%   BMI 28.48 kg/m     Wt Readings from Last 3 Encounters:  02/08/22 210 lb (95.3 kg)  10/10/21 209 lb 9.6 oz (95.1 kg)  08/01/21 212 lb 6.4 oz (96.3 kg)     GEN:  Well nourished, well developed in no acute distress HEENT: Normal NECK: No JVD; No carotid bruits LYMPHATICS: No lymphadenopathy CARDIAC: RRR, no murmurs, rubs, gallops RESPIRATORY:  Clear to auscultation without rales, wheezing or rhonchi  ABDOMEN: Soft, non-tender, non-distended MUSCULOSKELETAL:  No edema; No deformity  SKIN: Warm and dry NEUROLOGIC:  Alert and oriented x 3 PSYCHIATRIC:  Normal affect       ASSESSMENT:    1. PAF (paroxysmal atrial fibrillation) ([place])   2. Aortic atherosclerosis ([place])    PLAN:    In order of problems listed above:  #pAF On Eliquis and propafenone.  Referred to discuss left atrial appendage occlusion as a mechanism to avoid long-term exposure anticoagulation.  Given the patient's history of esophagectomy, he is a poor candidate for left atrial appendage occlusion utilizing transesophageal echo.  I did spend time today discussing the possibility of using intracardiac echo in the future to guide watchman implant and he would be interested.  We will keep him on our list of possible future candidates for left atrial appendage occlusion utilizing 4-dimensional ICE.  For now, continue Eliquis for stroke risk reduction.   ----------------   I have seen Thomas Moll L. Kempner in the office today who is being considered for a Watchman left atrial appendage closure device. I believe they will benefit from this procedure given their history of atrial fibrillation, CHA2DS2-VASc score of 4 and unadjusted ischemic stroke rate of 4.2% per year. The patient's chart has been reviewed and I feel that they would be a  candidate for short term oral anticoagulation after Watchman implant.   It is my belief that after undergoing a LAA closure procedure, Andie L. Cummiskey will not need long term anticoagulation which eliminates anticoagulation side effects and major bleeding risk.   Procedural risks for the Watchman implant have been reviewed with the patient including a 0.5% risk  of stroke, <1% risk of perforation and <1% risk of device embolization. Other risks include bleeding, vascular damage, tamponade, worsening renal function, and death. The patient understands these risks.     The published clinical data on the safety and effectiveness of WATCHMAN include but are not limited to the following: - Holmes DR, Mechele Claude, Sick P et al. for the PROTECT AF Investigators. Percutaneous closure of the left atrial appendage versus warfarin therapy for prevention of stroke in patients with atrial fibrillation: a randomised non-inferiority trial. Lancet 2009; 374: 534-42. Mechele Claude, Doshi SK, Abelardo Diesel D et al. on behalf of the PROTECT AF Investigators. Percutaneous Left Atrial Appendage Closure for Stroke Prophylaxis in Patients With Atrial Fibrillation 2.3-Year Follow-up of the PROTECT AF (Watchman Left Atrial Appendage System for Embolic Protection in Patients With Atrial Fibrillation) Trial. Circulation 2013; 127:720-729. - Alli O, Doshi S,  Kar S, Reddy VY, Sievert H et al. Quality of Life Assessment in the Randomized PROTECT AF (Percutaneous Closure of the Left Atrial Appendage Versus Warfarin Therapy for Prevention of Stroke in Patients With Atrial Fibrillation) Trial of Patients at Risk for Stroke With Nonvalvular Atrial Fibrillation. J Am Coll Cardiol 2013; 24:8250-0. Vertell Limber DR, Tarri Abernethy, Price M, [place], Sievert H, Doshi S, Huber K, Reddy V. Prospective randomized evaluation of the Watchman left atrial appendage Device in patients with atrial fibrillation versus long-term warfarin therapy; the PREVAIL trial.  Journal of the [employer] of Cardiology, Vol. 4, No. 1, 2014, 1-11. - Kar S, Doshi SK, Sadhu A, Horton R, Osorio J et al. Primary outcome evaluation of a next-generation left atrial appendage closure device: results from the PINNACLE FLX trial. Circulation 2021;143(18)1754-1762.   If the patient were to eventually proceed with watchman implant utilizing intracardiac echo guidance, would need CT scan time for visualization of left atrial appendage.   HAS-BLED score 2 Hypertension Yes  Abnormal renal and liver function (Dialysis, transplant, Cr >2.26 mg/dL /Cirrhosis or Bilirubin >2x Normal or AST/ALT/AP >3x Normal) No  Stroke No  Bleeding No  Labile INR (Unstable/high INR) No  Elderly (>65) Yes  Drugs or alcohol (? 8 drinks/week, anti-plt or NSAID) No   CHA2DS2-VASc Score = 4  The patient's score is based upon: CHF History: 0 HTN History: 1 Diabetes History: 0 Stroke History: 0 Vascular Disease History: 1 Age Score: 2 Gender Score: 0    Medication Adjustments/Labs and Tests Ordered: Current medicines are reviewed at length with the patient today.  Concerns regarding medicines are outlined above.  No orders of the defined types were placed in this encounter.  No orders of the defined types were placed in this encounter.    Signed, Hilton Cork. Quentin Ore, MD, [facility], [facility] 02/08/2022 7:58 PM    Electrophysiology [facility]

## 2022-02-28 IMAGING — CT CT ABD-PELV W/ CM
2 of 5 series · 15 of 46 positions shown, 17 images · IV contrast (iopamidol)
Comparison: 01/02/2021

CLINICAL DATA: Bowel obstruction suspected. Abdominal pain-hx of
small bowel obstructions.

EXAM:
CT ABDOMEN AND PELVIS WITH CONTRAST
TECHNIQUE: Multidetector CT imaging of the abdomen and pelvis was performed
using the standard protocol following bolus administration of
intravenous contrast.
CONTRAST:  100mL NB2WHA-4ZZ IOPAMIDOL (NB2WHA-4ZZ) INJECTION 61%

[Series 2: abd pelvis 5.00 br40 s3 axial · axial · 0.70mm/px · z∈[+1196,+1641]mm · 12 of 101 slices shown, 14 images]
[im 6/101  soft-tissue]
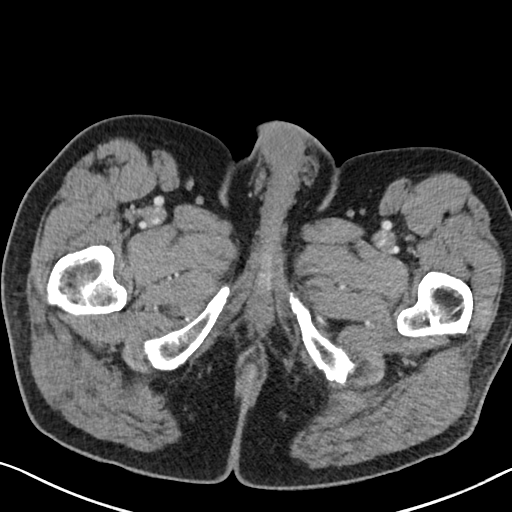
[im 6/101  bone]
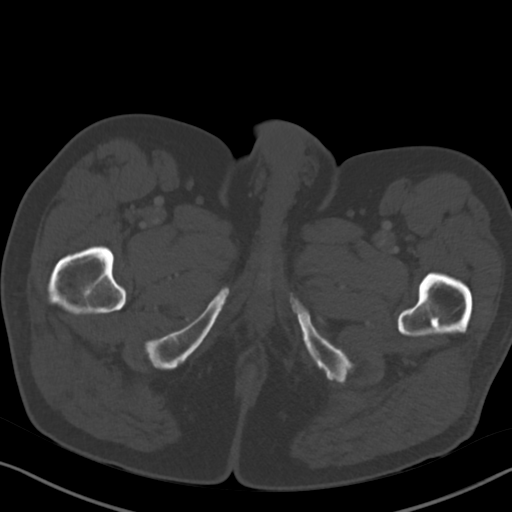
[im 18/101  soft-tissue]
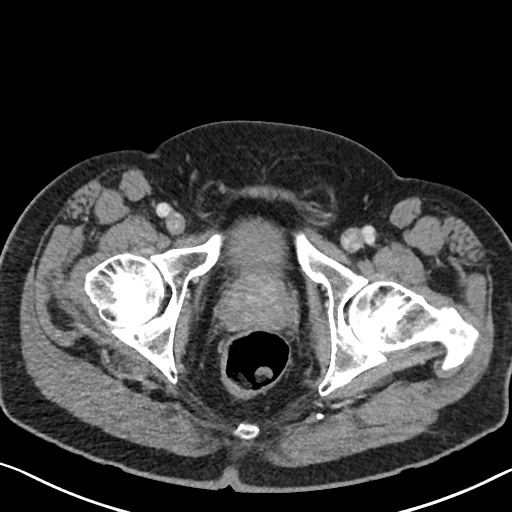
[im 24/101  soft-tissue]
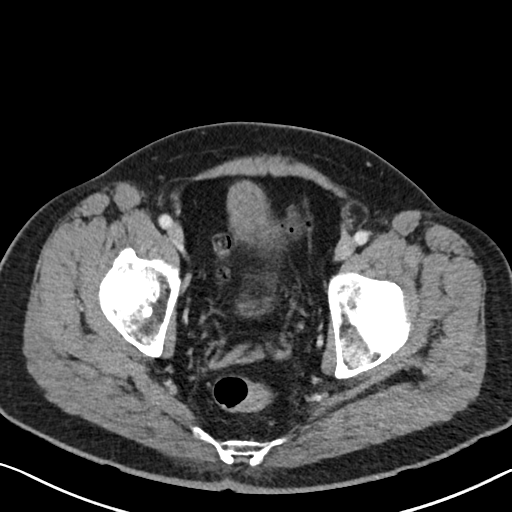
[im 30/101  soft-tissue]
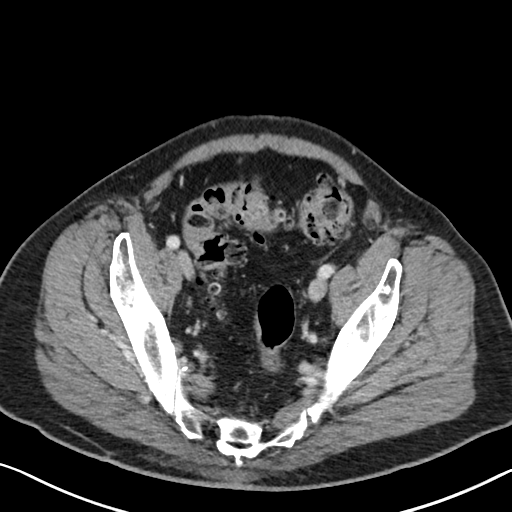
[im 42/101  soft-tissue]
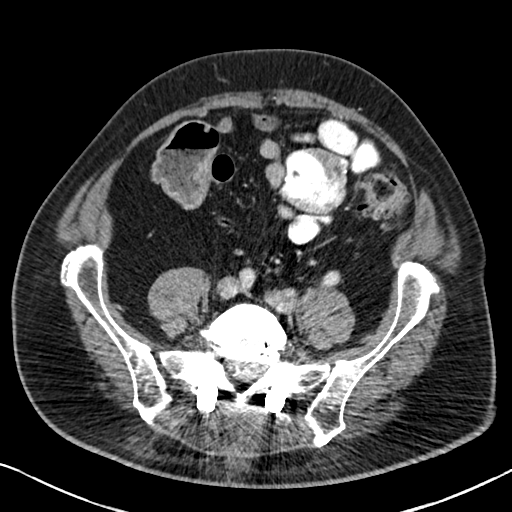
[im 48/101  soft-tissue]
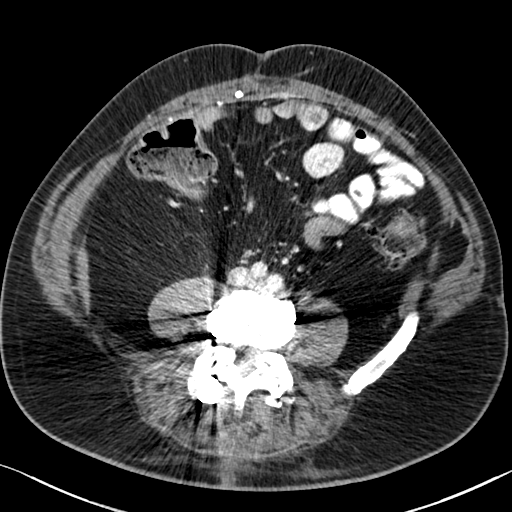
[im 53/101  soft-tissue]
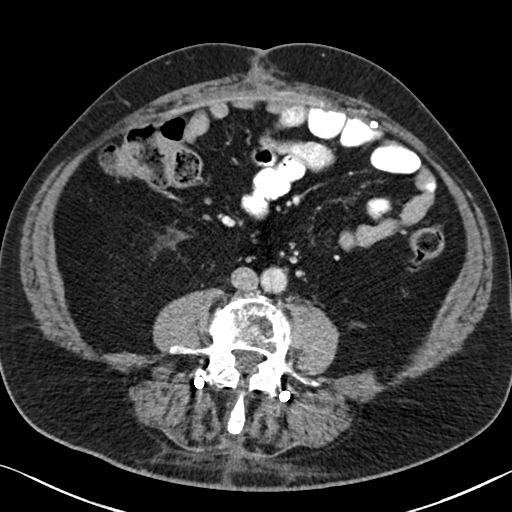
[im 65/101  soft-tissue]
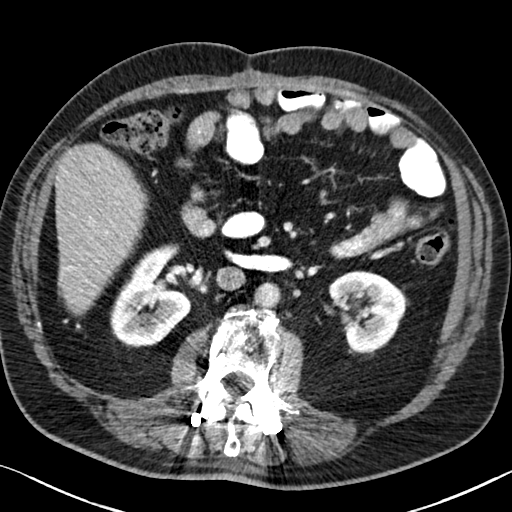
[im 71/101  soft-tissue]
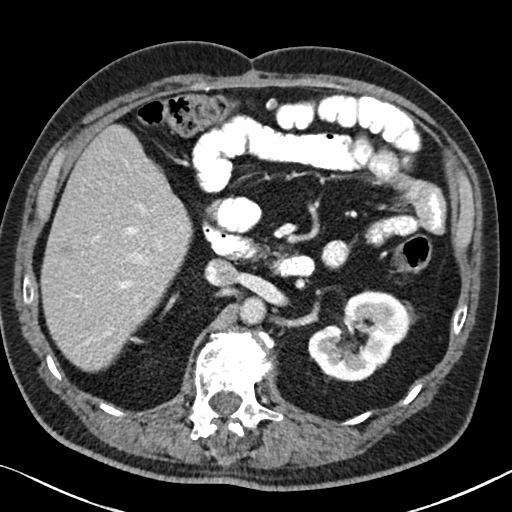
[im 71/101  bone]
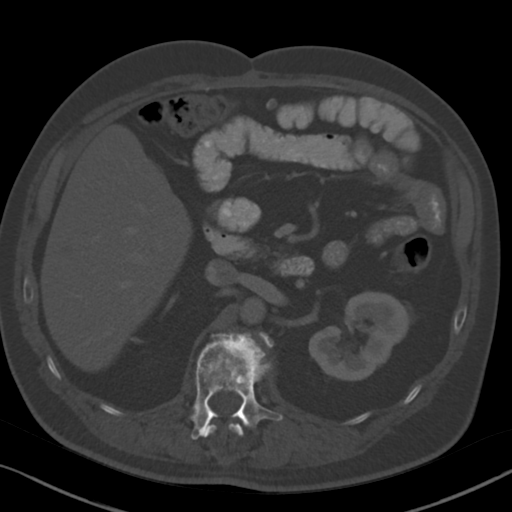
[im 77/101  soft-tissue]
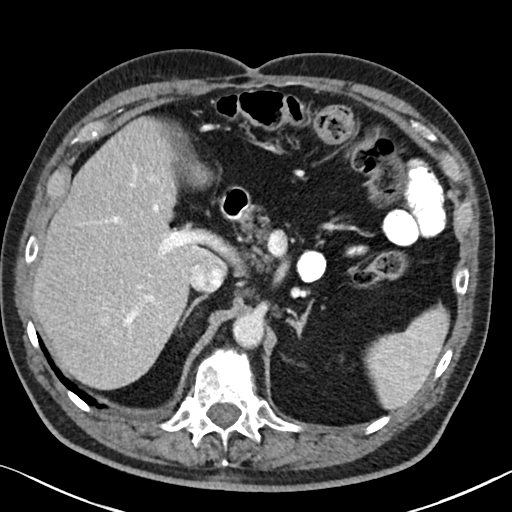
[im 89/101  soft-tissue]
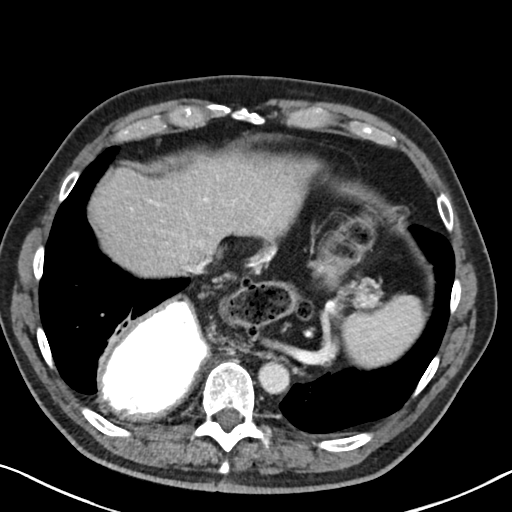
[im 95/101  soft-tissue]
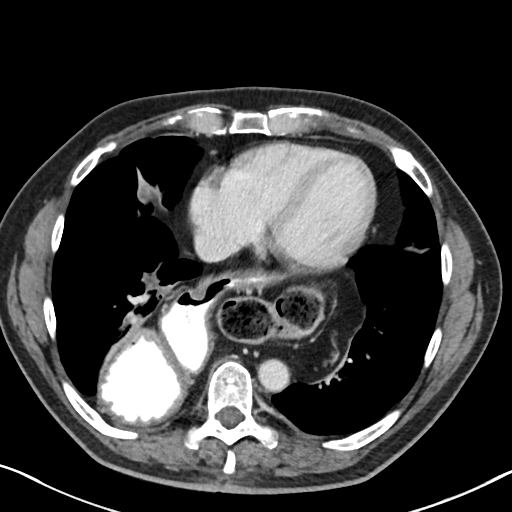

[Series 6: abd pelvis 2.00 br40 s3 cor · coronal · 0.70mm/px · 3 of 179 slices shown]
[im 60/179  soft-tissue]
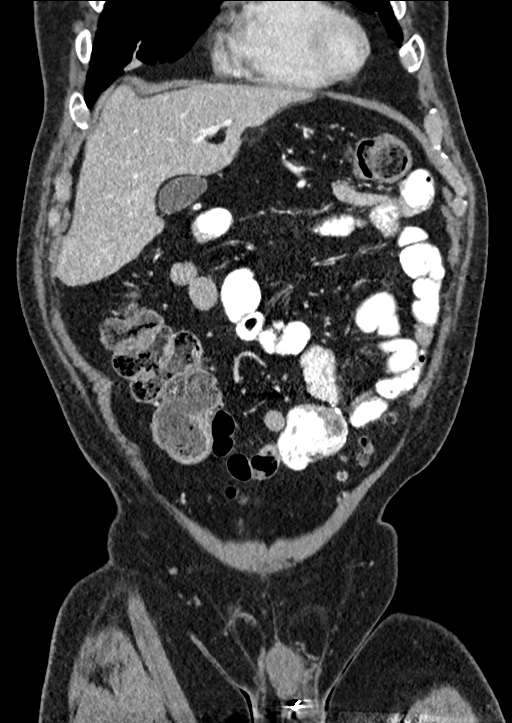
[im 80/179  soft-tissue]
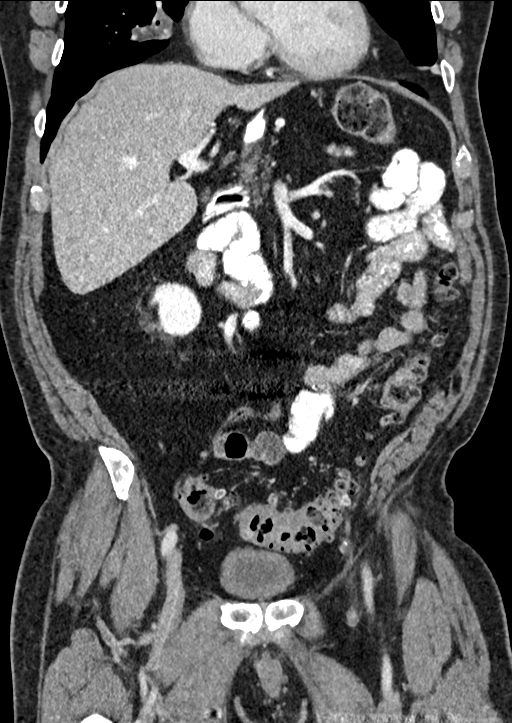
[im 99/179  soft-tissue]
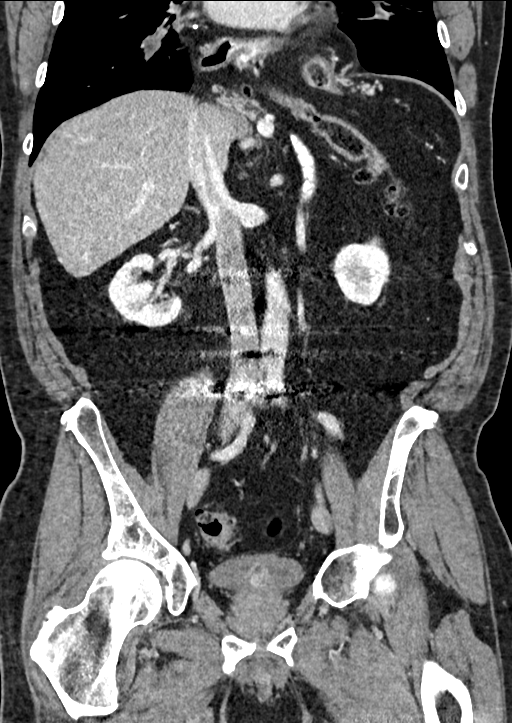

[15 of 46 positions shown; findings below may reference images not displayed]

FINDINGS: Lower chest: Sequelae of esophagectomy and gastric pull-through are
partially visualized with similar appearance of a large hiatal
hernia containing a short segment of the transverse colon and
portions of the pancreas and retroperitoneal vasculature. Right
lower lobe atelectasis. Partially visualized chronic
peribronchovascular opacities in both lung bases without significant
interval change. No pleural effusion.

Hepatobiliary: Unchanged 2.7 cm right hepatic cyst. Additional
subcentimeter hypodensity in the liver, too small to fully
characterize. Unremarkable gallbladder. No biliary dilatation.

Pancreas: Fatty infiltration of the pancreas which is partially
within the hiatal hernia. No ductal dilatation or acute
inflammation.

Spleen: Unremarkable.

Adrenals/Urinary Tract: Unremarkable adrenal glands. No renal
calculi or hydronephrosis. Subcentimeter hypodensity in the upper
pole of the left kidney, too small to fully characterize. Moderate
diffuse bladder wall thickening, increased from prior although this
may be partly due to underdistension.

Stomach/Bowel: There is extensive left-sided colonic diverticulosis.
Wall thickening of the sigmoid colon is similar to the prior CT
without surrounding inflammatory changes to clearly indicate acute
diverticulitis. A small bowel anastomosis is noted in the left lower
quadrant. Small bowel dilatation proximal to this anastomosis on the
prior CT has resolved, and there is no current evidence of a bowel
obstruction. The appendix is unremarkable.

Vascular/Lymphatic: Normal caliber of the abdominal aorta. No
enlarged lymph nodes.

Reproductive: Mildly enlarged prostate.

Other: No ascites or pneumoperitoneum. Prior ventral hernia repair.
Unchanged bilateral fat containing inguinal hernias.

Musculoskeletal: Mild thoracolumbar dextroscoliosis. L2-S1 fusion.
Advanced disc degeneration at T12-L1 and L1-2 with unchanged grade 1
retrolisthesis at the latter.
IMPRESSION: 1. Interval resolution of small bowel dilatation. No current
evidence of bowel obstruction.
2. Colonic diverticulosis with unchanged sigmoid colon wall
thickening, possibly chronic and related to previous inflammation.
No definite evidence of acute diverticulitis.
3. Unchanged large hiatal hernia containing a short segment of the
transverse colon and portions of the pancreas and retroperitoneal
vasculature.
4. Diffuse bladder wall thickening, increased from the prior CT.
Correlate for cystitis.
5. Unchanged peribronchovascular consolidation in the lung bases
with differential considerations as previously detailed.

## 2022-03-07 ENCOUNTER — Telehealth: Payer: Self-pay

## 2022-03-07 DIAGNOSIS — M5416 Radiculopathy, lumbar region: Secondary | ICD-10-CM | POA: Diagnosis not present

## 2022-03-07 NOTE — Telephone Encounter (Signed)
Left message to call back  

## 2022-03-07 NOTE — Telephone Encounter (Signed)
-----   Message from Vickie Epley, MD sent at 03/07/2022 10:32 AM EDT ----- Not a candidate. He could go to [facility] for an opinion. CL    ----- Message ----- From: Theodoro Parma, RN Sent: 03/06/2022   2:52 PM EDT To: Vickie Epley, MD  Hey Dr. Quentin Ore, Originally you said you wanted 4D ICE for Mr. Goguen's case due to esophagectomy. Do you think we will potentially use 4D ICE for cases like his or is he just not a candidate? Thank you! KK

## 2022-03-08 NOTE — Telephone Encounter (Signed)
Informed Thomas Dominguez Dr. Quentin Ore will not be using 4D ICE for the foreseeable future, deeming him not a candidate for Watchman implant at [facility].   Offered to refer the patient to [facility] and he agreed.   Call placed to [facility] and message left for their Watchman coordinator to call back. Will discuss the patient's unique needs (he has had an esophagectomy) to help decide who the patient can see at [facility] for best results.

## 2022-03-09 NOTE — Telephone Encounter (Signed)
Spoke with Careers information officer at [employer]. She will speak with providers and see if any implanters do not use TEE for procedures so referral may be placed appropriately.  She will call back to confirm implanter name.

## 2022-03-09 NOTE — Telephone Encounter (Signed)
Spoke with [facility]. After chart review, she states unfortunately they will not know how best to treat the patient until he is formally evaluated.   Referral sent to [facility] and MyChart message sent to patient as notification.

## 2022-03-09 NOTE — Telephone Encounter (Signed)
Another call placed to [facility]. The Watchman nurse, Joellen [place], is to call back when she gets the message from the scheduler.

## 2022-03-17 NOTE — Telephone Encounter (Signed)
The patient is scheduled with Dr. Janeece Riggers at [facility] to discuss [place] options on 04/26/2022.

## 2022-03-29 ENCOUNTER — Encounter: Payer: Medicare HMO | Admitting: Licensed Clinical Social Worker

## 2022-04-03 ENCOUNTER — Ambulatory Visit: Payer: Medicare HMO | Admitting: Internal Medicine

## 2022-04-03 ENCOUNTER — Encounter: Payer: Self-pay | Admitting: Internal Medicine

## 2022-04-03 VITALS — BP 112/62 | HR 94 | Temp 97.5°F | Ht 72.0 in | Wt 205.4 lb

## 2022-04-03 DIAGNOSIS — R0602 Shortness of breath: Secondary | ICD-10-CM

## 2022-04-03 DIAGNOSIS — Z23 Encounter for immunization: Secondary | ICD-10-CM | POA: Diagnosis not present

## 2022-04-03 NOTE — Progress Notes (Signed)
Thomas Dominguez    [id number]    02/20/1946  Primary Care Physician:Dahbura, Fenton Malling, DO Date of Appointment: 04/03/2022 Established Patient Visit  Chief complaint:   Chief Complaint  Patient presents with   Follow-up    Doing well doe when walking up a hill    HPI: Thomas Dominguez is a 76 y.o. gentleman who presented initially for dyspnea evaluation and abnormal CT Chest felt to be secondary to chronic aspiration. 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 Symptoms improved with pyloric channel dilation. Has recurrent bowel obstructions.   Interval Updates: Here for 1 year follow up for dyspnea. Has had recurrent pyloric valve dilations which seems to help symptoms.   Otherwise feels like he has been in good health.   He has improved dyspnea from when he first saw me a couple years ago. Now feels like he can do all his ADLs and only has dyspnea with exertion, going up hills.   He wishes he has better endurance. Usually walks about a mile a day. He would like to walk 2-3 miles on trails.  Symptoms improved in cooler weather and with less humidity.   Would like flu shot and pneumonia shot.   Records reviewed - [place], Cardiology, GI.   I have reviewed the patient's family social and past medical history and updated as appropriate.   Past Medical History:  Diagnosis Date   Acute ataxia 09/30/2019   Acute pneumothorax 04/08/2020   Atrial fibrillation ([place]) 01/17/2019   BPH (benign prostatic hyperplasia) 01/17/2019   Chronic pulmonary aspiration    Degenerative disc disease, lumbar 01/17/2019   Depression, major, single episode, complete remission ([place]) 01/17/2019   Displacement of lumbar intervertebral disc without myelopathy 04/22/2010   Diverticulosis 01/17/2019   Dysrhythmia    Dysrhythmias 12/19/2011   Esophageal cancer (HCC)    GERD (gastroesophageal reflux disease)     H/O small bowel obstruction 01/17/2019   Hiatal hernia 01/17/2019   History of esophagectomy 90s at [facility] for severe Barrett's with microfocus of cancer 11/22/2019   Hypertension    ILD (interstitial lung disease) ([place])    Inguinal hernia    bilateral, left more prominant   Left inguinal hernia 03/21/2021   Leg swelling 01/17/2019   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[place])    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   Pneumonia 11/2019   Pneumothorax 04/08/2020   SBO (small bowel obstruction) ([place]) 11/20/2019   Status post left inguinal hernia repair 04/15/2021   Wears glasses     Past Surgical History:  Procedure Laterality Date   ABDOMINAL HERNIA REPAIR  2012, 2014, 2017   ventral   BRONCHIAL BIOPSY  04/08/2020   Procedure: BRONCHIAL BIOPSIES;  Surgeon: Marshell Garfinkel, MD;  [facility];  Service: Cardiopulmonary;;   BRONCHIAL BRUSHINGS  04/08/2020   Procedure: BRONCHIAL BRUSHINGS;  Surgeon: Marshell Garfinkel, MD;  [facility];  Service: Cardiopulmonary;;   BRONCHIAL WASHINGS  04/08/2020   Procedure: BRONCHIAL WASHINGS;  Surgeon: Marshell Garfinkel, MD;  [facility];  Service: Cardiopulmonary;;   COLONOSCOPY  2019   ESOPHAGECTOMY  1997   for esophageal cancer   ESOPHAGOGASTRODUODENOSCOPY (EGD) WITH PROPOFOL N/A 06/11/2020   Procedure: ESOPHAGOGASTRODUODENOSCOPY (EGD) WITH PROPOFOL;  Surgeon: Mauri Pole, MD;  [facility];  Service: Endoscopy;  Laterality: N/A;   INGUINAL HERNIA REPAIR Left 03/22/2021   Procedure: OPEN REPAIR LEFT INGUINAL HERNIA WITH MESH;  Surgeon:  [name], MD;  [facility];  Service: General;  Laterality: Left;   PARATHYROIDECTOMY Right 01/26/2020   Procedure: RIGHT INFERIOR PARATHYROIDECTOMY;  Surgeon: [name], MD;  [facility];  Service: General;  Laterality: Right;   SPINAL FUSION  2018, 2020   L2 L5 in 2018, L5-S1 2020   VIDEO BRONCHOSCOPY N/A 04/08/2020   Procedure: VIDEO BRONCHOSCOPY WITH FLUORO;  Surgeon:  Marshell Garfinkel, MD;  [facility];  Service: Cardiopulmonary;  Laterality: N/A;    Family History  Problem Relation Age of Onset   GER disease Mother    Emphysema Father        smoker   Lung cancer Sister        never smoked   GER disease Daughter    GER disease Son    Colon cancer Neg Hx    Stomach cancer Neg Hx    Esophageal cancer Neg Hx     Social History   Occupational History   Occupation: retired    Fish farm manager: [employer]  Tobacco Use   Smoking status: Never   Smokeless tobacco: Never  Vaping Use   Vaping Use: Never used  Substance and Sexual Activity   Alcohol use: Not Currently    Alcohol/week: 7.0 standard drinks of alcohol    Types: 7 Cans of beer per week    Comment: Stopped a year ago   Drug use: Never   Sexual activity: Yes     Physical Exam: Blood pressure 112/62, pulse 94, temperature (!) 97.5 F (36.4 C), temperature source Oral, height 6' (1.829 m), weight 205 lb 6.4 oz (93.2 kg), SpO2 94 %.  Gen:      No acute distress ENT: septum slightly deviated to the right. Nares patent. No polyps Lungs:    Ctab no wheezes or crackles CV:         RRR, no mrg   Data Reviewed: Imaging: I have personally reviewed the CT Chest from Sept 2022 which shows patchy peribronchovascular tree in bud and some areas of dense consolidation which are consistent with previous areas of aspiration pneumonia. Not significantly changed. Also discussed with radiology personally.   PFTs:      Latest Ref Rng & Units 04/06/2021   12:49 PM 12/12/2019   12:05 PM  PFT Results  FVC-Pre L 3.00  3.06   FVC-Predicted Pre % 65  65   FVC-Post L 3.06  3.35   FVC-Predicted Post % 66  71   Pre FEV1/FVC % % 73  74   Post FEV1/FCV % % 76  78   FEV1-Pre L 2.20  2.25   FEV1-Predicted Pre % 66  66   FEV1-Post L 2.32  2.60   DLCO uncorrected ml/min/mmHg 18.32  20.05   DLCO UNC% % 68  74   DLCO corrected ml/min/mmHg 17.66  20.59   DLCO COR %Predicted % 66  76   DLVA  Predicted % 89  98   TLC L 5.41  6.04   TLC % Predicted % 72  81   RV % Predicted % 91  102    I have personally reviewed the patient's PFTs performed today which show stable FVC.   Labs: HP panel negative Pathology reviewed from 04/08/2020 A. LUNG, RUL, BIOPSY:  - Benign unremarkable bronchiolar mucosa and lung parenchyma  - Negative for granulomas or increased eosinophils   B. LUNG, RML, BIOPSY:  - Benign lung parenchyma with a poorly formed nonnecrotizing granuloma  with multinucleated giant cells.  See comment   C. LUNG, RLL, BIOPSY:  - Benign bronchiolar mucosa and lung parenchyma with a multinucleated  giant cell.  See comment   Immunization status: Immunization History  Administered Date(s) Administered   Fluad Quad(high Dose 65+) 04/23/2019, 03/17/2020, 04/03/2022   Influenza Inj Mdck Quad Pf 07/04/2017, 06/11/2018   Influenza, Quadrivalent, Recombinant, Inj, Pf 04/21/2019   Influenza,inj,Quad PF,6+ Mos 07/04/2017, 06/11/2018   Influenza-Unspecified 04/28/2019, 03/03/2020, 02/10/2021   PFIZER Comirnaty(Gray Top)Covid-19 Tri-Sucrose Vaccine 11/26/2019, 12/17/2019   PFIZER(Purple Top)SARS-COV-2 Vaccination 11/26/2019, 12/17/2019, 06/09/2020, 02/10/2021   PNEUMOCOCCAL CONJUGATE-20 04/03/2022   Pneumococcal Conjugate-13 03/22/2020   Pneumococcal Polysaccharide-23 04/06/2021   Tdap 03/22/2020    Assessment:  Dyspnea on Exertion,stable Chronic Aspiration, stable Atrial Fibrillation on Eliquis Need for pneumococcal vaccination  Plan/Recommendations: Continue to abstain from inhaler therapy.  He will let us know if the symptoms of dyspnea worsen or change. Discussed regular exercise routine to help improve endurance and stamina. No contraindications to activity from a pulmonary perspective. Will give PCV 13 today since we are out of the PCV20.   Return to Care: Return if symptoms worsen or fail to improve.  [name], MD Pulmonary [facility] [place]

## 2022-04-03 NOTE — Patient Instructions (Signed)
Come back and see [place] as needed if symptoms or breathing get worse.   We will give your flu shot and pneumonia shot today (PCV20.)

## 2022-04-05 DIAGNOSIS — N430 Encysted hydrocele: Secondary | ICD-10-CM | POA: Diagnosis not present

## 2022-04-05 DIAGNOSIS — N5201 Erectile dysfunction due to arterial insufficiency: Secondary | ICD-10-CM | POA: Diagnosis not present

## 2022-04-05 DIAGNOSIS — N50812 Left testicular pain: Secondary | ICD-10-CM | POA: Diagnosis not present

## 2022-04-05 DIAGNOSIS — N400 Enlarged prostate without lower urinary tract symptoms: Secondary | ICD-10-CM | POA: Diagnosis not present

## 2022-04-12 ENCOUNTER — Encounter: Payer: Self-pay | Admitting: Gastroenterology

## 2022-04-12 ENCOUNTER — Telehealth: Payer: Medicare HMO | Admitting: Gastroenterology

## 2022-04-12 DIAGNOSIS — R109 Unspecified abdominal pain: Secondary | ICD-10-CM | POA: Diagnosis not present

## 2022-04-12 DIAGNOSIS — Z8619 Personal history of other infectious and parasitic diseases: Secondary | ICD-10-CM

## 2022-04-12 DIAGNOSIS — K219 Gastro-esophageal reflux disease without esophagitis: Secondary | ICD-10-CM | POA: Diagnosis not present

## 2022-04-12 DIAGNOSIS — Z8501 Personal history of malignant neoplasm of esophagus: Secondary | ICD-10-CM | POA: Diagnosis not present

## 2022-04-12 DIAGNOSIS — Z8719 Personal history of other diseases of the digestive system: Secondary | ICD-10-CM

## 2022-04-12 NOTE — Patient Instructions (Addendum)
_______________________________________________________  If you are age 76 or older, your body mass index should be between 23-30. Your Body mass index is 28.24 kg/m. If this is out of the aforementioned range listed, please consider follow up with your Primary Care Provider. _______________________________________________________  The [facility] providers would like to encourage you to use [facility] to communicate with providers for non-urgent requests or questions.  Due to long hold times on the telephone, sending your provider a message by [facility] may be a faster and more efficient way to get a response.  Please allow 48 business hours for a response.  Please remember that this is for non-urgent requests.  _______________________________________________________  Your provider has requested that you go to the basement level for lab work before leaving today. Press "B" on the elevator. The lab is located at the first door on the left as you exit the elevator.  Due to recent changes in healthcare laws, you may see the results of your imaging and laboratory studies on MyChart before your provider has had a chance to review them.  We understand that in some cases there may be results that are confusing or concerning to you. Not all laboratory results come back in the same time frame and the provider may be waiting for multiple results in order to interpret others.  Please give [place] 48 hours in order for your provider to thoroughly review all the results before contacting the office for clarification of your results.   You will need a follow up in 6 months (April 2024).  We will contact you to schedule this appointment.  Thank you for entrusting me with your care and choosing [facility].  Dr Silverio Decamp

## 2022-04-12 NOTE — Progress Notes (Signed)
[place]    [id number]    1946-03-20  Primary Care Physician:Dahbura, Fenton Malling, DO  Referring Physician: Wells Guiles, DO 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  He had about 5 or 6 hospitalization in the past few years with SBO. His bowel habits are regular, he is taking Benefiber/operative fiber daily.  Denies any constipation, diarrhea, melena or blood per rectum.   He feels his GERD and dyspepsia symptoms have improved after he had pyloric channel dilation.  He is not a candidate for surgical revision He is taking omeprazole twice daily, continues to have breakthrough reflux symptoms on average once or twice a week.  Denies any dysphagia or odynophagia.     CT abdomen pelvis with contrast January 31, 2021 1. Interval resolution of small bowel dilatation. No current evidence of bowel obstruction. 2. Colonic diverticulosis with unchanged sigmoid colon wall thickening, possibly chronic and related to previous inflammation. No definite evidence of acute diverticulitis. 3. Unchanged large hiatal hernia containing a short segment of the transverse colon and portions of the pancreas and retroperitoneal vasculature. 4. Diffuse bladder wall thickening, increased from the prior CT. Correlate for cystitis. 5. Unchanged peribronchovascular consolidation  in the lung bases with differential considerations as previously detailed.     CT chest March 07, 2021 Peribronchovascular consolidations and ground-glass nodular opacities are unchanged in appearance compared to prior exam and likely sequela of aspiration. Areas of low attenuation are seen within the consolidations, suggesting a component of lipoid pneumonia. Status post esophagectomy with gastric pull-through.   CT cardiac coronary March 12, 2020: Cardiac coronary calcium score 0   CT chest high-resolution March 12, 2020: Widespread areas of thickening of peribronchovascular interstitium with architectural distortion including nodular and masslike opacities, surrounding groundglass attenuation and micronodularity stable compared to March 2021.    CT abdomen pelvis Nov 20, 2019: Mid small bowel obstruction likely due to adhesions.  Airspace disease in bilateral lungs worrisome for aspiration   04/02/2019: LVEF 55-60%   Colonoscopy 10/03/2017 Dr. Shirlyn Goltz (f/u diverticulitis)- severe pancolonic diverticulosis, tortuosity & spasm  EGD 03/2015 Dr. Shirlyn Goltz- changes of prior esophagectomy and gastric pull-through, normal mucosa throughout  EGD 03/2011 Dr. Shirlyn Goltz- surgically shortened anastomosis at 21 cm, no Barrett's changes seen. Fair amount of solid food retention. Gastric food retention obscuring pylorus. Very angulated atrium without stenosis, probably contributing to emptying difficulties. Because of coughing difficulties did not pursue duodenal intubation  EGD 10/2010 Dr. Shirlyn Goltz- gastric food retention obscuring pylorus  CLN 09/12/2012 Dr. Shirlyn Goltz- pancolonic diverticulosis, less than ideal prep, internal hemorrhoids  CLN 2004- severe left-sided diverticulosis  RECENT GI IMAGING STUDIES:  AXR 05/2018- no free air identified, no dilated bowel loops  CTAP 05/2018 SMALL BOWEL OBSTRUCTION. TRANSITION POINT IS NEAR A SMALL BOWEL ANASTOMOSIS IN THE DISTAL SMALL BOWEL CENTRAL  ABDOMEN IN THE REGION OF THE CENTRAL MESENTERY. EXACT TRANSITION POINT IS DIFFICULT TO IDENTIFY. EFFUSIONS CANNOT BE EXCLUDED. NO DEFINITIVE FOCAL  MASS. NO ADDITIONAL SIGNIFICANT CHANGE.  PET scan 07/2017 NO EVIDENCE OF HYPERMETABOLIC MALIGNANCY IN THE CHEST, ABDOMEN OR PELVIS. HYPERMETABOLIC ACTIVITY NEAR THE SMALL BOWEL ANASTOMOSIS HAS THE APPEARANCE OF PHYSIOLOGIC INTRALUMINAL BOWEL ACTIVITY WITH NO PERCEIVED MASS SEEN ON TODAY'S EXAM  PREVIOUS ABDOMINAL SURGERIES: Ventral hernia repair, paraesophageal hernia repair (2013), esophagectomy (1997)  CANCER HISTORY: Esophageal cancer s/p esophagectomy 1997 at [facility]. No family history of colon cancer.   Barium swallow 08/28/96 Post op anastomotic stricture s/p esophagogastrectomy. 70m barium tablet did not traverse.      Outpatient Encounter Medications as of 04/12/2022  Medication Sig   acetaminophen (TYLENOL) 325 MG tablet Take 650 mg by mouth every 6 (six) hours as needed for moderate pain.   apixaban (ELIQUIS) 5 MG TABS tablet Take 5 mg by mouth 2 (two) times daily.   famotidine (PEPCID) 20 MG tablet TAKE 1 TABLET AT BEDTIME   Hyoscyamine Sulfate SL (LEVSIN/SL) 0.125 MG SUBL Place 1 tablet under the tongue 2 (two) times daily as needed.   metoprolol succinate (TOPROL-XL) 25 MG 24 hr tablet TAKE 1 TABLET (25 MG TOTAL) BY MOUTH DAILY. (STOP LOPRESSOR)   Multiple Vitamins-Minerals (CENTRUM SILVER 50+MEN) TABS Take 1 tablet by mouth daily.   omeprazole (PRILOSEC) 40 MG capsule TAKE 1 CAPSULE TWICE DAILY   ondansetron (ZOFRAN) 4 MG tablet TAKE 1 TABLET EVERY EIGHT HOURS AS NEEDED FOR NAUSEA OR VOMITING.   tamsulosin (FLOMAX) 0.4 MG CAPS capsule Take 2 capsules (0.8 mg total) by mouth daily.   traMADol (ULTRAM) 50 MG tablet TAKE 1 TABLET (50 MG TOTAL) BY MOUTH 2 (TWO) TIMES DAILY AS NEEDED FOR MODERATE PAIN.   venlafaxine XR (EFFEXOR-XR) 150 MG 24 hr capsule TAKE 1 CAPSULE EVERY DAY WITH BREAKFAST   venlafaxine XR (EFFEXOR-XR) 75 MG 24 hr  capsule TAKE 1 CAPSULE DAILY (WITH THE '150MG'$  VENLAFAXINE CAPSULE)   No facility-administered encounter medications on file as of 04/12/2022.    Allergies as of 04/12/2022 - Review Complete 04/12/2022  Allergen Reaction Noted   Mobic [meloxicam] Shortness Of Breath 01/17/2019   Statins Other (See Comments) 01/17/2019    Past Medical History:  Diagnosis Date   Acute ataxia 09/30/2019   Acute pneumothorax 04/08/2020   Atrial fibrillation ([place] 01/17/2019   BPH (benign prostatic hyperplasia) 01/17/2019   Chronic pulmonary aspiration    Degenerative disc disease, lumbar 01/17/2019   Depression, major, single episode, complete remission ([place] 01/17/2019   Displacement of lumbar intervertebral disc without myelopathy 04/22/2010   Diverticulosis 01/17/2019   Dysrhythmia    Dysrhythmias 12/19/2011   Esophageal cancer ([place]    GERD (gastroesophageal reflux disease)    H/O small bowel obstruction 01/17/2019   Hiatal hernia 01/17/2019   [facility] 11/22/2019   Hypertension    ILD (interstitial lung disease) ([place]    Inguinal hernia    bilateral, left more prominant   Left inguinal hernia 03/21/2021   Leg swelling 01/17/2019   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[place]    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   Pneumonia 11/2019   Pneumothorax 04/08/2020   SBO (small bowel obstruction) ([place] 11/20/2019   Status post left inguinal hernia repair 04/15/2021   Wears glasses     Past Surgical History:  Procedure Laterality Date   ABDOMINAL HERNIA REPAIR  2012, 2014, 2017   ventral   BRONCHIAL BIOPSY  04/08/2020   Procedure: BRONCHIAL BIOPSIES;  Surgeon: MMarshell Garfinkel MD;  [facility];  Service: Cardiopulmonary;;  BRONCHIAL BRUSHINGS  04/08/2020   Procedure: BRONCHIAL BRUSHINGS;  Surgeon: Marshell Garfinkel, MD;  [facility];  Service: Cardiopulmonary;;   BRONCHIAL WASHINGS  04/08/2020   Procedure: BRONCHIAL  WASHINGS;  Surgeon: Marshell Garfinkel, MD;  [facility];  Service: Cardiopulmonary;;   COLONOSCOPY  2019   ESOPHAGECTOMY  1997   for esophageal cancer   ESOPHAGOGASTRODUODENOSCOPY (EGD) WITH PROPOFOL N/A 06/11/2020   Procedure: ESOPHAGOGASTRODUODENOSCOPY (EGD) WITH PROPOFOL;  Surgeon: Mauri Pole, MD;  [facility];  Service: Endoscopy;  Laterality: N/A;   INGUINAL HERNIA REPAIR Left 03/22/2021   Procedure: OPEN REPAIR LEFT INGUINAL HERNIA WITH MESH;  Surgeon: [name], MD;  [facility];  Service: General;  Laterality: Left;   PARATHYROIDECTOMY Right 01/26/2020   Procedure: RIGHT INFERIOR PARATHYROIDECTOMY;  Surgeon: [name], MD;  [facility];  Service: General;  Laterality: Right;   SPINAL FUSION  2018, 2020   L2 L5 in 2018, L5-S1 2020   VIDEO BRONCHOSCOPY N/A 04/08/2020   Procedure: VIDEO BRONCHOSCOPY WITH FLUORO;  Surgeon: Marshell Garfinkel, MD;  [facility];  Service: Cardiopulmonary;  Laterality: N/A;    Family History  Problem Relation Age of Onset   GER disease Mother    Emphysema Father        smoker   Lung cancer Sister        never smoked   GER disease Daughter    GER disease Son    Colon cancer Neg Hx    Stomach cancer Neg Hx    Esophageal cancer Neg Hx     Social History   Socioeconomic History   Marital status: Married    Spouse name: [name]   Number of children: 2   Years of education: 16   Highest education level: Bachelor's degree (e.g., BA, AB, BS)  Occupational History   Occupation: retired    Fish farm manager: [employer]  Tobacco Use   Smoking status: Never   Smokeless tobacco: Never  Vaping Use   Vaping Use: Never used  Substance and Sexual Activity   Alcohol use: Not Currently    Alcohol/week: 7.0 standard drinks of alcohol    Types: 7 Cans of beer per week    Comment: Stopped a year ago   Drug use: Never   Sexual activity: Yes  Other Topics Concern   Not on file  Social History Narrative    Patient lives with his wife in [place].    Patient has one son in [place] and one daughter in [place].    Patient walks daily at local parks/trails around [place].    Patient has one dog.    Social Determinants of Health   Financial Resource Strain: Low Risk  (04/07/2021)   Overall Financial Resource Strain (CARDIA)    Difficulty of Paying Living Expenses: Not hard at all  Food Insecurity: No Food Insecurity (04/07/2021)   Hunger Vital Sign    Worried About Running Out of Food in the Last Year: Never true    Ran Out of Food in the Last Year: Never true  Transportation Needs: No Transportation Needs (04/07/2021)   PRAPARE - Hydrologist (Medical): No    Lack of Transportation (Non-Medical): No  Physical Activity: Sufficiently Active (04/07/2021)   Exercise Vital Sign    Days of Exercise per Week: 6 days    Minutes of Exercise per Session: 40 min  Stress: No Stress Concern Present (04/07/2021)   [place]  Questionnaire    Feeling of Stress : Not at all  Social Connections: Socially Integrated (04/07/2021)   Social Connection and Isolation Panel [NHANES]    Frequency of Communication with Friends and Family: More than three times a week    Frequency of Social Gatherings with Friends and Family: More than three times a week    Attends Religious Services: More than 4 times per year    Active Member of [employer] or Organizations: Yes    Attends Music therapist: More than 4 times per year    Marital Status: Married  Human resources officer Violence: Not At Risk (04/07/2021)   Humiliation, Afraid, Rape, and Kick questionnaire    Fear of Current or Ex-Partner: No    Emotionally Abused: No    Physically Abused: No    Sexually Abused: No      Review of systems: All other review of systems negative except as mentioned in the HPI.   Physical Exam: Vitals:   04/12/22 0948  BP: 110/80  Pulse: 89    Body mass index is 28.24 kg/m. Gen:      No acute distress HEENT:  sclera anicteric Abd:      soft, non-tender; no palpable masses, no distension Ext:    No edema Neuro: alert and oriented x 3 Psych: normal mood and affect  Data Reviewed:  Reviewed labs, radiology imaging, old records and pertinent past GI work up   Assessment and Plan/Recommendations:  76 year old very pleasant gentleman with history of chronic GERD, Barrett's esophagus, esophageal carcinoma s/p resection with gastric pull-through, hiatal and ventral hernia repair with recurrent hernia involving colon and pancreas through the postoperative hiatus, recurrent small bowel obstructions requiring hospitalizations secondary to adhesions   Recurrent small bowel obstruction secondary to adhesions Discussed dietary changes avoid high-fiber diet with insoluble fiber Continue Benefiber 1 teaspoon 3 times daily with meals Increase water intake to 8 to 10 cups daily Use sublingual Levsin for severe abdominal cramping If has severe abdominal pain or signs of small bowel obstruction, will need to come to ER for further management    GERD: Continue twice daily omeprazole Add Pepcid 20 mg daily at bedtime as needed Gaviscon after meals as needed for postprandial breakthrough reflux symptoms Antireflux measures   Nausea secondary to delayed gastric emptying due to altered anatomy postsurgically and uncontrolled GERD Use Zofran 4 mg daily as needed for severe nausea  We will check H. pylori stool antigen, he is concerned about ap possibly exacerbating his symptoms     History of esophageal cancer and Barrett's esophagus: He is up-to-date with surveillance History of diverticulitis April 2020, he has pancolonic diverticulosis.  He is up-to-date with colorectal cancer screening     Return in 6 months or sooner if needed   The patient was provided an opportunity to ask questions and all were answered. The patient agreed  with the plan and demonstrated an understanding of the instructions.  Damaris Hippo , MD    CC: Wells Guiles, DO

## 2022-04-18 ENCOUNTER — Other Ambulatory Visit: Payer: Medicare HMO

## 2022-04-18 DIAGNOSIS — Z8619 Personal history of other infectious and parasitic diseases: Secondary | ICD-10-CM

## 2022-04-20 LAB — H. PYLORI ANTIGEN, STOOL: H pylori Ag, Stl: NEGATIVE

## 2022-04-23 ENCOUNTER — Other Ambulatory Visit: Payer: Self-pay | Admitting: Cardiology

## 2022-04-26 ENCOUNTER — Encounter (HOSPITAL_BASED_OUTPATIENT_CLINIC_OR_DEPARTMENT_OTHER): Payer: Self-pay | Admitting: Cardiology

## 2022-04-26 DIAGNOSIS — I480 Paroxysmal atrial fibrillation: Secondary | ICD-10-CM | POA: Diagnosis not present

## 2022-04-26 DIAGNOSIS — E892 Postprocedural hypoparathyroidism: Secondary | ICD-10-CM | POA: Diagnosis not present

## 2022-04-27 DIAGNOSIS — N430 Encysted hydrocele: Secondary | ICD-10-CM | POA: Diagnosis not present

## 2022-04-27 DIAGNOSIS — N5201 Erectile dysfunction due to arterial insufficiency: Secondary | ICD-10-CM | POA: Diagnosis not present

## 2022-04-27 MED ORDER — PROPAFENONE HCL 225 MG PO TABS: 225.0000 mg | ORAL_TABLET | Freq: Three times a day (TID) | ORAL | 1 refills | Status: DC

## 2022-04-27 NOTE — Telephone Encounter (Signed)
Yes we can refill 

## 2022-05-08 ENCOUNTER — Other Ambulatory Visit: Payer: Self-pay | Admitting: Gastroenterology

## 2022-05-12 ENCOUNTER — Encounter: Payer: Self-pay | Admitting: Gastroenterology

## 2022-06-05 ENCOUNTER — Encounter: Payer: Self-pay | Admitting: Student

## 2022-06-05 MED ORDER — TAMSULOSIN HCL 0.4 MG PO CAPS: 0.8000 mg | ORAL_CAPSULE | Freq: Every day | ORAL | 1 refills | Status: DC

## 2022-06-13 DIAGNOSIS — M461 Sacroiliitis, not elsewhere classified: Secondary | ICD-10-CM | POA: Diagnosis not present

## 2022-07-12 ENCOUNTER — Telehealth: Payer: Self-pay | Admitting: Gastroenterology

## 2022-07-12 NOTE — Telephone Encounter (Signed)
He is currently undergoing serial dilation of pylorus for pyloric obstruction but doesn't last for more than 2-3 months.  Please send referral to Dr.Branch at GI to evaluate for possible ESD or needle knife incision of pyloric channel.  Thanks

## 2022-07-13 NOTE — Telephone Encounter (Signed)
Yes, Dr Harl Bowie, [facility]. Thanks

## 2022-07-13 NOTE — Telephone Encounter (Signed)
Office notes, procedure notes and imaging results faxed with the referral form to [facility].

## 2022-07-16 ENCOUNTER — Other Ambulatory Visit: Payer: Self-pay | Admitting: Student

## 2022-07-16 DIAGNOSIS — F339 Major depressive disorder, recurrent, unspecified: Secondary | ICD-10-CM

## 2022-07-25 DIAGNOSIS — D485 Neoplasm of uncertain behavior of skin: Secondary | ICD-10-CM | POA: Diagnosis not present

## 2022-07-25 DIAGNOSIS — L308 Other specified dermatitis: Secondary | ICD-10-CM | POA: Diagnosis not present

## 2022-07-25 DIAGNOSIS — D692 Other nonthrombocytopenic purpura: Secondary | ICD-10-CM | POA: Diagnosis not present

## 2022-07-25 DIAGNOSIS — Z85820 Personal history of malignant melanoma of skin: Secondary | ICD-10-CM | POA: Diagnosis not present

## 2022-07-25 DIAGNOSIS — L821 Other seborrheic keratosis: Secondary | ICD-10-CM | POA: Diagnosis not present

## 2022-07-25 DIAGNOSIS — L570 Actinic keratosis: Secondary | ICD-10-CM | POA: Diagnosis not present

## 2022-07-25 DIAGNOSIS — D225 Melanocytic nevi of trunk: Secondary | ICD-10-CM | POA: Diagnosis not present

## 2022-07-26 ENCOUNTER — Other Ambulatory Visit: Payer: Self-pay | Admitting: Gastroenterology

## 2022-07-30 NOTE — Progress Notes (Unsigned)
Cardiology Office Note:    Date:  07/31/2022   ID:  Thomas Dominguez, Thomas Dominguez February 06, 1946, MRN [id number]  PCP:  Wells Guiles, DO  Cardiologist:  Donato Heinz, MD  Electrophysiologist:  Vickie Epley, MD   Referring MD: Wells Guiles, DO   Chief complaint: AF  History of Present Illness:    Thomas Dominguez is a 77 y.o. male with a hx of atrial fibrillation, esophageal cancer status post esophagectomy, BPH, depression, degenerative disc disease who presents for follow-up.  He was referred by Dr. Owens Shark for evaluation of atrial fibrillation, initially seen on 03/18/2019.  He reports that he was diagnosed with atrial fibrillation in 2013.  He was previously seeing a cardiologist in [place], [place].  He had been alternating stress test and echo every other year.  Has been on propafenone and diltiazem since 2013.  States that he always feels it when he goes into AF.  Will occur every 3-4 days, can last up to 1 hour.  Feels short of breath when in AF.  Had back surgery in June 2020, has been improving.  States that walks 1.5 miles per day.  Has been having dyspnea with exertion but able to complete his walks.  He reports that he checks his BP at home, has always been normal.   TTE 04/02/2019 showed EF 55 to 15%, grade 2 diastolic dysfunction, normal RV function, no significant valvular disease.  14-day Zio patch showed no recurrent atrial fibrillation but did show 20 episodes of SVT, longest lasting 11 seconds.  Triggered events corresponded to sinus rhythm and rare PVCs.  Calcium score 0 on 03/12/2020.  Underwent bronchospy in October 1761 which was complicated by pneumothorax.  Biopsy was negative. 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  He was admitted 7/10 through 01/04/2021 with small bowel obstruction.  He has had 5 prior episodes of small bowel obstruction, thought to be due to from prior scar tissue and resolves with NG  tube decompression.  Since last clinic visit, he reports he has been doing okay.  He was seen at [facility] for [place] evaluation, he is considering.  Reports no atrial fibrillation since he has been back on propafenone.  Denies any bleeding on Eliquis.  Denies any chest pain, lower extremity edema, or palpitations.  Reports he continues to have dyspnea on exertion.  Reports some lightheadedness with standing, denies any syncope.   Past Medical History:  Diagnosis Date   Acute ataxia 09/30/2019   Acute pneumothorax 04/08/2020   Atrial fibrillation (HCC) 01/17/2019   BPH (benign prostatic hyperplasia) 01/17/2019   Chronic pulmonary aspiration    Degenerative disc disease, lumbar 01/17/2019   Depression, major, single episode, complete remission ([place]) 01/17/2019   Displacement of lumbar intervertebral disc without myelopathy 04/22/2010   Diverticulosis 01/17/2019   Dysrhythmia    Dysrhythmias 12/19/2011   Esophageal cancer (HCC)    GERD (gastroesophageal reflux disease)    H/O small bowel obstruction 01/17/2019   Hiatal hernia 01/17/2019   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 11/22/2019   Hypertension    ILD (interstitial lung disease) ([place])    Inguinal hernia    bilateral, left more prominant   Left inguinal hernia 03/21/2021   Leg swelling 01/17/2019   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[place])    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   Pneumonia 11/2019   Pneumothorax 04/08/2020   SBO (small bowel obstruction) ([place]) 11/20/2019  Status post left inguinal hernia repair 04/15/2021   Wears glasses     Past Surgical History:  Procedure Laterality Date   ABDOMINAL HERNIA REPAIR  2012, 2014, 2017   ventral   BRONCHIAL BIOPSY  04/08/2020   Procedure: BRONCHIAL BIOPSIES;  Surgeon: Marshell Garfinkel, MD;  Location: [facility];  Service: Cardiopulmonary;;   BRONCHIAL BRUSHINGS  04/08/2020   Procedure: BRONCHIAL BRUSHINGS;  Surgeon: Marshell Garfinkel, MD;  Location:  [facility];  Service: Cardiopulmonary;;   BRONCHIAL WASHINGS  04/08/2020   Procedure: BRONCHIAL WASHINGS;  Surgeon: Marshell Garfinkel, MD;  [facility]: [facility];  Service: Cardiopulmonary;;   COLONOSCOPY  2019   ESOPHAGECTOMY  1997   for esophageal cancer   ESOPHAGOGASTRODUODENOSCOPY (EGD) WITH PROPOFOL N/A 06/11/2020   Procedure: ESOPHAGOGASTRODUODENOSCOPY (EGD) WITH PROPOFOL;  Surgeon: Mauri Pole, MD;  [facility]: [facility];  Service: Endoscopy;  Laterality: N/A;   INGUINAL HERNIA REPAIR Left 03/22/2021   Procedure: OPEN REPAIR LEFT INGUINAL HERNIA WITH MESH;  Surgeon: [name], MD;  Location: [facility];  Service: General;  Laterality: Left;   PARATHYROIDECTOMY Right 01/26/2020   Procedure: RIGHT INFERIOR PARATHYROIDECTOMY;  Surgeon: [name], MD;  Location: [facility];  Service: General;  Laterality: Right;   SPINAL FUSION  2018, 2020   L2 L5 in 2018, L5-S1 2020   VIDEO BRONCHOSCOPY N/A 04/08/2020   Procedure: VIDEO BRONCHOSCOPY WITH FLUORO;  Surgeon: Marshell Garfinkel, MD;  [facility];  Service: Cardiopulmonary;  Laterality: N/A;    Current Medications: Current Meds  Medication Sig   acetaminophen (TYLENOL) 325 MG tablet Take 650 mg by mouth every 6 (six) hours as needed for moderate pain.   apixaban (ELIQUIS) 5 MG TABS tablet Take 5 mg by mouth 2 (two) times daily.   famotidine (PEPCID) 20 MG tablet TAKE 1 TABLET AT BEDTIME   hyoscyamine (LEVSIN SL) 0.125 MG SL tablet PLACE 1 TABLET UNDER THE TONGUE 2 (TWO) TIMES DAILY AS NEEDED.   metoprolol succinate (TOPROL-XL) 25 MG 24 hr tablet TAKE 1 TABLET (25 MG TOTAL) BY MOUTH DAILY. (STOP LOPRESSOR)   Multiple Vitamins-Minerals (CENTRUM SILVER 50+MEN) TABS Take 1 tablet by mouth daily.   omeprazole (PRILOSEC) 40 MG capsule TAKE 1 CAPSULE TWICE DAILY   ondansetron (ZOFRAN) 4 MG tablet TAKE 1 TABLET EVERY EIGHT HOURS AS NEEDED FOR NAUSEA OR VOMITING.   propafenone (RYTHMOL) 225 MG tablet Take 1 tablet (225 mg total) by  mouth every 8 (eight) hours.   tamsulosin (FLOMAX) 0.4 MG CAPS capsule Take 2 capsules (0.8 mg total) by mouth daily.   traMADol (ULTRAM) 50 MG tablet TAKE 1 TABLET (50 MG TOTAL) BY MOUTH 2 (TWO) TIMES DAILY AS NEEDED FOR MODERATE PAIN.   venlafaxine XR (EFFEXOR-XR) 150 MG 24 hr capsule TAKE 1 CAPSULE EVERY DAY WITH BREAKFAST   venlafaxine XR (EFFEXOR-XR) 75 MG 24 hr capsule TAKE 1 CAPSULE DAILY (WITH THE '150MG'$  VENLAFAXINE CAPSULE)   [DISCONTINUED] metoprolol tartrate (LOPRESSOR) 100 MG tablet Take 1 tablet ('100mg'$ ) TWO hours prior to CT scan     Allergies:   Mobic [meloxicam] and Statins   Social History   Socioeconomic History   Marital status: Married    Spouse name: [name]   Number of children: 2   Years of education: 16   Highest education level: Bachelor's degree (e.g., BA, AB, BS)  Occupational History   Occupation: retired    Fish farm manager: [employer]  Tobacco Use   Smoking status: Never   Smokeless tobacco: Never  Vaping Use   Vaping Use:  Never used  Substance and Sexual Activity   Alcohol use: Not Currently    Alcohol/week: 7.0 standard drinks of alcohol    Types: 7 Cans of beer per week    Comment: Stopped a year ago   Drug use: Never   Sexual activity: Yes  Other Topics Concern   Not on file  Social History Narrative   Patient lives with his wife in [place].    Patient has one son in [place] and one daughter in [place].    Patient walks daily at local parks/trails around [place].    Patient has one dog.    Social Determinants of Health   Financial Resource Strain: Low Risk  (04/07/2021)   Overall Financial Resource Strain (CARDIA)    Difficulty of Paying Living Expenses: Not hard at all  Food Insecurity: No Food Insecurity (04/07/2021)   Hunger Vital Sign    Worried About Running Out of Food in the Last Year: Never true    Ran Out of Food in the Last Year: Never true  Transportation Needs: No Transportation Needs (04/07/2021)   PRAPARE - Civil engineer, contracting (Medical): No    Lack of Transportation (Non-Medical): No  Physical Activity: Sufficiently Active (04/07/2021)   Exercise Vital Sign    Days of Exercise per Week: 6 days    Minutes of Exercise per Session: 40 min  Stress: No Stress Concern Present (04/07/2021)   [place]    Feeling of Stress : Not at all  Social Connections: [place] (04/07/2021)   Social Connection and Isolation Panel [NHANES]    Frequency of Communication with Friends and Family: More than three times a week    Frequency of Social Gatherings with Friends and Family: More than three times a week    Attends Religious Services: More than 4 times per year    Active Member of [employer] or Organizations: Yes    Attends Music therapist: More than 4 times per year    Marital Status: Married     Family History: The patient's family history includes Emphysema in his father; GER disease in his daughter, mother, and son; Lung cancer in his sister. There is no history of Colon cancer, Stomach cancer, or Esophageal cancer.  ROS:   Please see the history of present illness.     All other systems reviewed and are negative.  EKGs/Labs/Other Studies Reviewed:    The following studies were reviewed today:   EKG:  07/31/2022: Normal sinus rhythm, rate 79, no ST abnormalities 08/01/21: Normal sinus rhythm, rate 86, no ST abnormalities 07/22: normal sinus rhythm 1st degree AV block, rate 67, no ST abnormalities 05/18/20: NSR, rate 70, no ST/T changes   Cardiac monitor 04/22/19: No atrial fibrillation seen. 20 episodes of SVT, longest lasting 11 seconds.   13 days of data recorded on Zio monitor. Patient had a min HR of 59 bpm, max HR of 174 bpm, and avg HR of 87 bpm. Predominant underlying rhythm was Sinus Rhythm. No VT, atrial fibrillation, high degree block, or pauses noted. Isolated atrial and ventricular ectopy was rare  (<1%). 20 episodes of of SVT, longest lasting 11 seconds (rate 144 bpm); fastest was rate 174 bpm, lasting 12 beats.There were 8 triggered events, which corresponded to sinus rhythm and rare PVCs. No significant arrhythmias detected.  TTE 04/02/19:  1. Left ventricular ejection fraction, by visual estimation, is 55 to 60%. The left ventricle has  normal function. There is no left ventricular hypertrophy.  2. Left ventricular diastolic Doppler parameters are consistent with pseudonormalization pattern of LV diastolic filling.  3. Global right ventricle has normal systolic function.The right ventricular size is mildly enlarged. No increase in right ventricular wall thickness.  4. Left atrial size was normal.  5. Right atrial size was normal.  6. The mitral valve is normal in structure. No evidence of mitral valve regurgitation. No evidence of mitral stenosis.  7. The tricuspid valve is normal in structure. Tricuspid valve regurgitation is trivial.  8. The aortic valve is normal in structure. Aortic valve regurgitation was not visualized by color flow Doppler.  9. The pulmonic valve was normal in structure. Pulmonic valve regurgitation is not visualized by color flow Doppler. 10. Normal pulmonary artery systolic pressure. 11. The atrial septum is grossly normal.  Recent Labs: No results found for requested labs within last 365 days.  Recent Lipid Panel    Component Value Date/Time   CHOL 186 12/26/2019 0946   TRIG 176 (H) 12/26/2019 0946   HDL 67 12/26/2019 0946   CHOLHDL 2.8 12/26/2019 0946   LDLCALC 89 12/26/2019 0946    Physical Exam:    VS:  BP 118/78 (BP Location: Right Arm, Patient Position: Sitting, Cuff Size: Normal)   Pulse 79   Ht 6' (1.829 m)   Wt 204 lb (92.5 kg)   BMI 27.67 kg/m     Wt Readings from Last 3 Encounters:  07/31/22 204 lb (92.5 kg)  04/12/22 208 lb 4 oz (94.5 kg)  04/03/22 205 lb 6.4 oz (93.2 kg)     GEN:  Well nourished, well developed in no acute  distress HEENT: Normal NECK: No JVD; No carotid bruits CARDIAC: RRR, no murmurs, rubs, gallops RESPIRATORY:  Clear to auscultation without rales, wheezing or rhonchi  ABDOMEN: Soft, non-tender, non-distended MUSCULOSKELETAL: no LE edema; No deformity  SKIN: Warm and dry NEUROLOGIC:  Alert and oriented x 3 PSYCHIATRIC:  Normal affect   ASSESSMENT:    1. PAF (paroxysmal atrial fibrillation) ([place])   2. SVT (supraventricular tachycardia)   3. DOE (dyspnea on exertion)   4. Hyperlipidemia, unspecified hyperlipidemia type      PLAN:     Atrial fibrillation: paroxysmal, on propafenone 225 mg 3 times daily and diltiazem 120 mg daily.  CHADS-VASc 2 given age.  Cardiac monitor 04/22/2019 shows no evidence of recurrent A. Fib. TTE on 04/02/2019 showed normal biventricular function, no significant valvular disease - Continue Eliquis 5 mg twice daily - On propafenone 225 mg 3 times daily - Continue Toprol-XL 25 mg daily -He requested referral to Dr. Quentin Ore for [facility] evaluation.  Not a good candidate given esophageal cancer status post esophagectomy.  Dr. Quentin Ore did state that they may move towards procedure being guided by intracardiac echo, but for now is guided by transesophageal echo so patient is not a candidate for watchman at [facility]; he was referred to [facility] for further evaluation, he is considering  SVT: Having intermittent episodes on monitor, longest lasting up to 11 seconds.  Reports palpitations have resolved.  Continue Toprol-XL and propafenone as above  Dyspnea: Following with pulmonology, CT chest 11/30/2019 with findings consistent with sequela of prior inflammation.  PFTs on 12/12/2019 with findings suggestive of restriction but normal lung volumes.  Diastolic dysfunction could be contributing, though BNP checked at prior clinic visit and was unremarkable (21).  Underwent bronchoscopy with biopsy, which was unremarkable.  Thought to have ILD secondary to recurrent aspiration from  gastric motility issues, seen pulmonology and gastroenterology.   -Reports he continues to have dyspnea on exertion.  Could represent anginal equivalent.  Given he is on propafenone, need to rule out CAD, as propafenone would be contraindicated in setting of obstructive CAD.  Recommend coronary CTA for further evaluation.  Will give Lopressor 100 mg prior to study  Hyperlipidemia: LDL 101 on 06/10/2019.  Started on atorvastatin but unable to tolerate due to myalgias.  Calcium score 0 on 03/12/2020.  Can hold off on statin for now, we will follow-up results of coronary CTA as above.  Check lipid panel.  RTC in 6 months  Medication Adjustments/Labs and Tests Ordered: Current medicines are reviewed at length with the patient today.  Concerns regarding medicines are outlined above.  Orders Placed This Encounter  Procedures   CT CORONARY MORPH W/CTA COR W/SCORE W/CA W/CM &/OR WO/CM   Basic metabolic panel   Lipid panel    Meds ordered this encounter  Medications   DISCONTD: metoprolol tartrate (LOPRESSOR) 100 MG tablet    Sig: Take 1 tablet ('100mg'$ ) TWO hours prior to CT scan    Dispense:  1 tablet    Refill:  0   metoprolol tartrate (LOPRESSOR) 100 MG tablet    Sig: Take 1 tablet ('100mg'$ ) TWO hours prior to CT scan    Dispense:  1 tablet    Refill:  0     Patient Instructions  Medication Instructions:  Your physician recommends that you continue on your current medications as directed. Please refer to the Current Medication list given to you today.  *If you need a refill on your cardiac medications before your next appointment, please call your pharmacy*   Lab Work: BMET, Lipid today  If you have labs (blood work) drawn today and your tests are completely normal, you will receive your results only by: [place] (if you have MyChart) OR A paper copy in the mail If you have any lab test that is abnormal or we need to change your treatment, we will call you to review the  results.   Testing/Procedures: Coronary CTA-see instructions below  Follow-Up: At [facility], you and your health needs are our priority.  As part of our continuing mission to provide you with exceptional heart care, we have created designated Provider Care Teams.  These Care Teams include your primary Cardiologist (physician) and Advanced Practice Providers (APPs -  Physician Assistants and Nurse Practitioners) who all work together to provide you with the care you need, when you need it.  We recommend signing up for the patient portal called "MyChart".  Sign up information is provided on this After Visit Summary.  MyChart is used to connect with patients for Virtual Visits (Telemedicine).  Patients are able to view lab/test results, encounter notes, upcoming appointments, etc.  Non-urgent messages can be sent to your provider as well.   To learn more about what you can do with MyChart, go to [email or web address].    Your next appointment:   6 month(s)  Provider:   Donato Heinz, MD     Other Instructions   Your cardiac CT will be scheduled at one of the below locations:   [facility] [address] [place], [place] [address] [phone]   If scheduled at [facility], please arrive at the [facility] and Children's Entrance (Entrance C2) of [facility] 30 minutes prior to test start time. You can use the FREE valet parking offered at entrance  C (encouraged to control the heart rate for the test)  Proceed to the [facility] Radiology Department (first floor) to check-in and test prep.  All radiology patients and guests should use entrance C2 at [facility], accessed from [facility], even though the hospital's physical address listed is [address].     Please follow these instructions carefully (unless otherwise directed):  Hold all erectile dysfunction medications at least 3 days (72 hrs) prior  to test. (Ie viagra, cialis, sildenafil, tadalafil, etc) We will administer nitroglycerin during this exam.   On the Night Before the Test: Be sure to Drink plenty of water. Do not consume any caffeinated/decaffeinated beverages or chocolate 12 hours prior to your test. Do not take any antihistamines 12 hours prior to your test.  On the Day of the Test: Drink plenty of water until 1 hour prior to the test. Do not eat any food 1 hour prior to test. You may take your regular medications prior to the test.  HOLD TOPROL XL day of test Take metoprolol 100 mg  (Lopressor) two hours prior to test.      After the Test: Drink plenty of water. After receiving IV contrast, you may experience a mild flushed feeling. This is normal. On occasion, you may experience a mild rash up to 24 hours after the test. This is not dangerous. If this occurs, you can take Benadryl 25 mg and increase your fluid intake. If you experience trouble breathing, this can be serious. If it is severe call 911 IMMEDIATELY. If it is mild, please call our office. If you take any of these medications: Glipizide/Metformin, Avandament, Glucavance, please do not take 48 hours after completing test unless otherwise instructed.  We will call to schedule your test 2-4 weeks out understanding that some insurance companies will need an authorization prior to the service being performed.   For non-scheduling related questions, please contact the cardiac imaging nurse navigator should you have any questions/concerns: Marchia Bond, Cardiac Imaging Nurse Navigator Gordy Clement, Cardiac Imaging Nurse Navigator [facility] Direct Office Dial: [phone]   For scheduling needs, including cancellations and rescheduling, please call [place], [phone].       Signed, Donato Heinz, MD  07/31/2022 11:41 AM    [place]

## 2022-07-31 ENCOUNTER — Ambulatory Visit: Payer: Medicare HMO | Attending: Cardiology | Admitting: Cardiology

## 2022-07-31 VITALS — BP 118/78 | HR 79 | Ht 72.0 in | Wt 204.0 lb

## 2022-07-31 DIAGNOSIS — R0609 Other forms of dyspnea: Secondary | ICD-10-CM | POA: Diagnosis not present

## 2022-07-31 DIAGNOSIS — E785 Hyperlipidemia, unspecified: Secondary | ICD-10-CM | POA: Diagnosis not present

## 2022-07-31 DIAGNOSIS — I471 Supraventricular tachycardia, unspecified: Secondary | ICD-10-CM

## 2022-07-31 DIAGNOSIS — I480 Paroxysmal atrial fibrillation: Secondary | ICD-10-CM | POA: Diagnosis not present

## 2022-07-31 MED ORDER — METOPROLOL TARTRATE 100 MG PO TABS: ORAL_TABLET | ORAL | 0 refills | Status: DC

## 2022-07-31 NOTE — Patient Instructions (Addendum)
Medication Instructions:  Your physician recommends that you continue on your current medications as directed. Please refer to the Current Medication list given to you today.  *If you need a refill on your cardiac medications before your next appointment, please call your pharmacy*   Lab Work: BMET, Lipid today  If you have labs (blood work) drawn today and your tests are completely normal, you will receive your results only by: [place] (if you have MyChart) OR A paper copy in the mail If you have any lab test that is abnormal or we need to change your treatment, we will call you to review the results.   Testing/Procedures: Coronary CTA-see instructions below  Follow-Up: At [facility], you and your health needs are our priority.  As part of our continuing mission to provide you with exceptional heart care, we have created designated Provider Care Teams.  These Care Teams include your primary Cardiologist (physician) and Advanced Practice Providers (APPs -  Physician Assistants and Nurse Practitioners) who all work together to provide you with the care you need, when you need it.  We recommend signing up for the patient portal called "MyChart".  Sign up information is provided on this After Visit Summary.  MyChart is used to connect with patients for Virtual Visits (Telemedicine).  Patients are able to view lab/test results, encounter notes, upcoming appointments, etc.  Non-urgent messages can be sent to your provider as well.   To learn more about what you can do with MyChart, go to [email or web address].    Your next appointment:   6 month(s)  Provider:   Donato Heinz, MD     Other Instructions   Your cardiac CT will be scheduled at one of the below locations:   [facility] [address] [place], [place] [address] [phone]   If scheduled at [facility], please arrive at the [facility] and Children's Entrance (Entrance  C2) of [facility] 30 minutes prior to test start time. You can use the FREE valet parking offered at entrance C (encouraged to control the heart rate for the test)  Proceed to the [facility] Radiology Department (first floor) to check-in and test prep.  All radiology patients and guests should use entrance C2 at [facility], accessed from [facility], even though the hospital's physical address listed is [address].     Please follow these instructions carefully (unless otherwise directed):  Hold all erectile dysfunction medications at least 3 days (72 hrs) prior to test. (Ie viagra, cialis, sildenafil, tadalafil, etc) We will administer nitroglycerin during this exam.   On the Night Before the Test: Be sure to Drink plenty of water. Do not consume any caffeinated/decaffeinated beverages or chocolate 12 hours prior to your test. Do not take any antihistamines 12 hours prior to your test.  On the Day of the Test: Drink plenty of water until 1 hour prior to the test. Do not eat any food 1 hour prior to test. You may take your regular medications prior to the test.  HOLD TOPROL XL day of test Take metoprolol 100 mg  (Lopressor) two hours prior to test.      After the Test: Drink plenty of water. After receiving IV contrast, you may experience a mild flushed feeling. This is normal. On occasion, you may experience a mild rash up to 24 hours after the test. This is not dangerous. If this occurs, you can take Benadryl 25 mg  and increase your fluid intake. If you experience trouble breathing, this can be serious. If it is severe call 911 IMMEDIATELY. If it is mild, please call our office. If you take any of these medications: Glipizide/Metformin, Avandament, Glucavance, please do not take 48 hours after completing test unless otherwise instructed.  We will call to schedule your test 2-4 weeks out understanding that some insurance companies will need  an authorization prior to the service being performed.   For non-scheduling related questions, please contact the cardiac imaging nurse navigator should you have any questions/concerns: Marchia Bond, Cardiac Imaging Nurse Navigator Gordy Clement, Cardiac Imaging Nurse Navigator [place] [facility] Direct Office Dial: [phone]   For scheduling needs, including cancellations and rescheduling, please call [place], [phone].

## 2022-08-01 DIAGNOSIS — H2513 Age-related nuclear cataract, bilateral: Secondary | ICD-10-CM | POA: Diagnosis not present

## 2022-08-01 DIAGNOSIS — H43813 Vitreous degeneration, bilateral: Secondary | ICD-10-CM | POA: Diagnosis not present

## 2022-08-01 DIAGNOSIS — H2181 Floppy iris syndrome: Secondary | ICD-10-CM | POA: Diagnosis not present

## 2022-08-01 LAB — LIPID PANEL
Chol/HDL Ratio: 3 ratio (ref 0.0–5.0)
Cholesterol, Total: 207 mg/dL — ABNORMAL HIGH (ref 100–199)
HDL: 68 mg/dL (ref >39)
LDL Chol Calc (NIH): 104 mg/dL — ABNORMAL HIGH (ref 0–99)
Triglycerides: 205 mg/dL — ABNORMAL HIGH (ref 0–149)
VLDL Cholesterol Cal: 35 mg/dL (ref 5–40)

## 2022-08-01 LAB — BASIC METABOLIC PANEL
BUN/Creatinine Ratio: 13 (ref 10–24)
BUN: 13 mg/dL (ref 8–27)
CO2: 27 mmol/L (ref 20–29)
Calcium: 9.4 mg/dL (ref 8.6–10.2)
Chloride: 97 mmol/L (ref 96–106)
Creatinine, Ser: 1 mg/dL (ref 0.76–1.27)
Glucose: 116 mg/dL — ABNORMAL HIGH (ref 70–99)
Potassium: 4.7 mmol/L (ref 3.5–5.2)
Sodium: 136 mmol/L (ref 134–144)
eGFR: 78 mL/min/{1.73_m2} (ref >59)

## 2022-08-03 NOTE — Addendum Note (Signed)
Addended by: Patria Mane A on: 08/03/2022 08:58 AM   Modules accepted: Orders

## 2022-08-07 ENCOUNTER — Encounter: Payer: Self-pay | Admitting: Cardiology

## 2022-08-09 ENCOUNTER — Telehealth (HOSPITAL_COMMUNITY): Payer: Self-pay | Admitting: Emergency Medicine

## 2022-08-09 NOTE — Telephone Encounter (Signed)
Reaching out to patient to offer assistance regarding upcoming cardiac imaging study; pt verbalizes understanding of appt date/time, parking situation and where to check in, pre-test NPO status and medications ordered, and verified current allergies; name and call back number provided for further questions should they arise Marchia Bond RN Navigator Cardiac Imaging [facility] [phone] office [phone] cell   Arrival [address]  Denies iv issues 141m metoprolol tartrate Aware contrast/ nitro

## 2022-08-11 ENCOUNTER — Ambulatory Visit (HOSPITAL_COMMUNITY)
Admission: RE | Admit: 2022-08-11 | Discharge: 2022-08-11 | Disposition: A | Discharge status: Home / Self Care | Payer: Medicare HMO | Source: Ambulatory Visit | Attending: Cardiology | Admitting: Cardiology

## 2022-08-11 DIAGNOSIS — R0609 Other forms of dyspnea: Secondary | ICD-10-CM | POA: Insufficient documentation

## 2022-08-11 DIAGNOSIS — I2510 Atherosclerotic heart disease of native coronary artery without angina pectoris: Secondary | ICD-10-CM

## 2022-08-11 MED ORDER — IOHEXOL 350 MG/ML SOLN
100.0000 mL | Freq: Once | INTRAVENOUS | Status: AC | PRN
  Administered 2022-08-11: 100 mL via INTRAVENOUS

## 2022-08-11 MED ORDER — NITROGLYCERIN 0.4 MG SL SUBL: 0.8000 mg | SUBLINGUAL_TABLET | Freq: Once | SUBLINGUAL | Status: AC

## 2022-08-11 MED ORDER — NITROGLYCERIN 0.4 MG SL SUBL
SUBLINGUAL_TABLET | SUBLINGUAL | Status: AC
  Administered 2022-08-11: 0.8 mg via SUBLINGUAL
  Filled 2022-08-11: qty 2

## 2022-08-14 ENCOUNTER — Other Ambulatory Visit: Payer: Self-pay | Admitting: *Deleted

## 2022-08-14 MED ORDER — ROSUVASTATIN CALCIUM 5 MG PO TABS: 5.0000 mg | ORAL_TABLET | Freq: Every day | ORAL | 3 refills | Status: DC

## 2022-08-22 DIAGNOSIS — H2511 Age-related nuclear cataract, right eye: Secondary | ICD-10-CM | POA: Diagnosis not present

## 2022-08-22 DIAGNOSIS — H5703 Miosis: Secondary | ICD-10-CM | POA: Diagnosis not present

## 2022-08-26 ENCOUNTER — Encounter: Payer: Self-pay | Admitting: Student

## 2022-08-29 ENCOUNTER — Other Ambulatory Visit: Payer: Self-pay | Admitting: Gastroenterology

## 2022-08-30 ENCOUNTER — Telehealth: Payer: Self-pay | Admitting: *Deleted

## 2022-08-30 ENCOUNTER — Encounter: Payer: Self-pay | Admitting: *Deleted

## 2022-08-30 NOTE — Telephone Encounter (Signed)
Eliquis patient assistance faxed to [facility]

## 2022-08-31 ENCOUNTER — Other Ambulatory Visit: Payer: Self-pay | Admitting: Student

## 2022-08-31 DIAGNOSIS — N401 Enlarged prostate with lower urinary tract symptoms: Secondary | ICD-10-CM

## 2022-08-31 DIAGNOSIS — M461 Sacroiliitis, not elsewhere classified: Secondary | ICD-10-CM | POA: Diagnosis not present

## 2022-08-31 MED ORDER — FINASTERIDE 5 MG PO TABS: 5.0000 mg | ORAL_TABLET | Freq: Every day | ORAL | 0 refills | Status: DC

## 2022-08-31 MED ORDER — TAMSULOSIN HCL 0.4 MG PO CAPS: 0.4000 mg | ORAL_CAPSULE | Freq: Every day | ORAL | 1 refills | Status: DC

## 2022-09-03 DIAGNOSIS — H2512 Age-related nuclear cataract, left eye: Secondary | ICD-10-CM | POA: Diagnosis not present

## 2022-09-05 ENCOUNTER — Telehealth: Payer: Self-pay

## 2022-09-05 DIAGNOSIS — H2512 Age-related nuclear cataract, left eye: Secondary | ICD-10-CM | POA: Diagnosis not present

## 2022-09-05 DIAGNOSIS — H5703 Miosis: Secondary | ICD-10-CM | POA: Diagnosis not present

## 2022-09-05 NOTE — Telephone Encounter (Signed)
Thomas Dominguez is needing the reports of the serial dilations for the patient's referral to Dr Harl Bowie of [facility]. Reports will be in Care Everywhere. The dilations were most recently done by a Merchant navy officer. 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  Please send referral to Dr.Branch at GI to evaluate for possible ESD or needle knife incision of pyloric channel

## 2022-09-14 DIAGNOSIS — M461 Sacroiliitis, not elsewhere classified: Secondary | ICD-10-CM | POA: Diagnosis not present

## 2022-09-19 DIAGNOSIS — M461 Sacroiliitis, not elsewhere classified: Secondary | ICD-10-CM | POA: Diagnosis not present

## 2022-09-22 DIAGNOSIS — M461 Sacroiliitis, not elsewhere classified: Secondary | ICD-10-CM | POA: Diagnosis not present

## 2022-09-26 DIAGNOSIS — M461 Sacroiliitis, not elsewhere classified: Secondary | ICD-10-CM | POA: Diagnosis not present

## 2022-09-28 DIAGNOSIS — M461 Sacroiliitis, not elsewhere classified: Secondary | ICD-10-CM | POA: Diagnosis not present

## 2022-10-04 DIAGNOSIS — M461 Sacroiliitis, not elsewhere classified: Secondary | ICD-10-CM | POA: Diagnosis not present

## 2022-10-05 DIAGNOSIS — M461 Sacroiliitis, not elsewhere classified: Secondary | ICD-10-CM | POA: Diagnosis not present

## 2022-10-07 ENCOUNTER — Other Ambulatory Visit: Payer: Self-pay | Admitting: Gastroenterology

## 2022-10-12 DIAGNOSIS — M461 Sacroiliitis, not elsewhere classified: Secondary | ICD-10-CM | POA: Diagnosis not present

## 2022-10-13 ENCOUNTER — Other Ambulatory Visit: Payer: Self-pay

## 2022-10-13 DIAGNOSIS — N401 Enlarged prostate with lower urinary tract symptoms: Secondary | ICD-10-CM

## 2022-10-13 MED ORDER — FINASTERIDE 5 MG PO TABS: 5.0000 mg | ORAL_TABLET | Freq: Every day | ORAL | 0 refills | Status: DC

## 2022-10-20 ENCOUNTER — Other Ambulatory Visit (HOSPITAL_BASED_OUTPATIENT_CLINIC_OR_DEPARTMENT_OTHER): Payer: Self-pay | Admitting: Cardiology

## 2022-10-20 ENCOUNTER — Other Ambulatory Visit: Payer: Self-pay

## 2022-10-20 DIAGNOSIS — M5136 Other intervertebral disc degeneration, lumbar region: Secondary | ICD-10-CM

## 2022-10-24 MED ORDER — TRAMADOL HCL 50 MG PO TABS: 50.0000 mg | ORAL_TABLET | Freq: Two times a day (BID) | ORAL | 0 refills | Status: DC | PRN

## 2022-10-25 ENCOUNTER — Encounter: Payer: Self-pay | Admitting: Gastroenterology

## 2022-10-25 DIAGNOSIS — K3184 Gastroparesis: Secondary | ICD-10-CM | POA: Diagnosis not present

## 2022-10-28 ENCOUNTER — Other Ambulatory Visit: Payer: Self-pay | Admitting: Student

## 2022-10-30 ENCOUNTER — Telehealth: Payer: Self-pay | Admitting: Cardiology

## 2022-10-30 NOTE — Telephone Encounter (Signed)
  Patient would like to switch from Dr Bjorn Pippin to Dr Jacques Navy since his wife also sees Jacques Navy.

## 2022-10-31 NOTE — Telephone Encounter (Signed)
Ok with me.  Thomas Dominguez 

## 2022-11-01 ENCOUNTER — Ambulatory Visit: Payer: Medicare HMO | Admitting: Gastroenterology

## 2022-11-01 DIAGNOSIS — M461 Sacroiliitis, not elsewhere classified: Secondary | ICD-10-CM | POA: Diagnosis not present

## 2022-11-02 ENCOUNTER — Telehealth: Payer: Self-pay | Admitting: Student

## 2022-11-02 NOTE — Telephone Encounter (Signed)
Contacted Domonick L. Souders to schedule their annual wellness visit. Appointment made for 11/03/2022.  Thank you,  [facility] Support [facility] Direct dial  [phone]

## 2022-11-03 ENCOUNTER — Ambulatory Visit (INDEPENDENT_AMBULATORY_CARE_PROVIDER_SITE_OTHER): Payer: Medicare HMO

## 2022-11-03 VITALS — Ht 72.0 in | Wt 204.0 lb

## 2022-11-03 DIAGNOSIS — Z0000 Encounter for general adult medical examination without abnormal findings: Secondary | ICD-10-CM | POA: Diagnosis not present

## 2022-11-04 NOTE — Progress Notes (Cosign Needed)
Subjective:   Thomas Dominguez is a 77 y.o. male who presents for Medicare Annual/Subsequent preventive examination.  I connected with  Thomas Dominguez on 11/03/22 by a audio enabled telemedicine application and verified that I am speaking with the correct person using two identifiers.  Patient Location: Home  Provider Location: Home Office  I discussed the limitations of evaluation and management by telemedicine. The patient expressed understanding and agreed to proceed.  Review of Systems     Cardiac Risk Factors include: advanced age (>66men, >21 women);male gender;dyslipidemia     Objective:    Today's Vitals   11/04/22 2055  Weight: 204 lb (92.5 kg)  Height: 6' (1.829 m)   Body mass index is 27.67 kg/m.     11/04/2022    9:02 PM 04/07/2021   10:26 AM 03/16/2021   11:07 AM 01/02/2021    7:50 PM 06/11/2020   12:40 PM 05/07/2020    9:40 AM 04/08/2020   10:39 AM  Advanced Directives  Does Patient Have a Medical Advance Directive? Yes No No No No No No  Type of Advance Directive Living will        Does patient want to make changes to medical advance directive? No - Patient declined        Would patient like information on creating a medical advance directive?  Yes (MAU/Ambulatory/Procedural Areas - Information given) No - Patient declined No - Patient declined Yes (Inpatient - patient defers creating a medical advance directive and declines information at this time) No - Patient declined No - Patient declined    Current Medications (verified) Outpatient Encounter Medications as of 11/03/2022  Medication Sig   apixaban (ELIQUIS) 5 MG TABS tablet Take 5 mg by mouth 2 (two) times daily.   famotidine (PEPCID) 20 MG tablet TAKE 1 TABLET AT BEDTIME   finasteride (PROSCAR) 5 MG tablet Take 1 tablet (5 mg total) by mouth daily. Please make annual appointment before next refill.   hyoscyamine (LEVSIN SL) 0.125 MG SL tablet PLACE 1 TABLET UNDER THE TONGUE 2 (TWO) TIMES DAILY AS  NEEDED.   metoprolol succinate (TOPROL-XL) 25 MG 24 hr tablet TAKE 1 TABLET (25 MG TOTAL) BY MOUTH DAILY. (STOP LOPRESSOR)   Multiple Vitamins-Minerals (CENTRUM SILVER 50+MEN) TABS Take 1 tablet by mouth daily.   omeprazole (PRILOSEC) 40 MG capsule TAKE 1 CAPSULE TWICE DAILY   ondansetron (ZOFRAN) 4 MG tablet TAKE 1 TABLET EVERY EIGHT HOURS AS NEEDED FOR NAUSEA OR VOMITING.   propafenone (RYTHMOL) 225 MG tablet TAKE 1 TABLET EVERY 8 HOURS   tamsulosin (FLOMAX) 0.4 MG CAPS capsule Take 1 capsule (0.4 mg total) by mouth daily.   traMADol (ULTRAM) 50 MG tablet Take 1 tablet (50 mg total) by mouth 2 (two) times daily as needed for moderate pain. Needs appointment for further refills.   venlafaxine XR (EFFEXOR-XR) 150 MG 24 hr capsule TAKE 1 CAPSULE EVERY DAY WITH BREAKFAST   [DISCONTINUED] acetaminophen (TYLENOL) 325 MG tablet Take 650 mg by mouth every 6 (six) hours as needed for moderate pain.   [DISCONTINUED] rosuvastatin (CRESTOR) 5 MG tablet Take 1 tablet (5 mg total) by mouth daily.   [DISCONTINUED] venlafaxine XR (EFFEXOR-XR) 75 MG 24 hr capsule TAKE 1 CAPSULE DAILY (WITH THE 150MG  VENLAFAXINE CAPSULE)   No facility-administered encounter medications on file as of 11/03/2022.    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   Displacement of lumbar intervertebral disc without myelopathy 04/22/2010   Diverticulosis 01/17/2019   Dysrhythmia    Dysrhythmias 12/19/2011   Esophageal cancer (HCC)    GERD (gastroesophageal reflux disease)    H/O small bowel obstruction 01/17/2019   Hiatal hernia 01/17/2019    History of esophagectomy [facility] at [facility] for severe Barrett's with microfocus of cancer 11/22/2019   Hyperlipidemia 2020   Hypertension    ILD (interstitial lung disease) (HCC)    Inguinal hernia    bilateral, left more prominant   Left inguinal hernia 03/21/2021   Leg swelling 01/17/2019   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HCC)    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   Pneumonia 11/2019   Pneumothorax 04/08/2020   SBO (small bowel obstruction) (HCC) 11/20/2019   Status post left inguinal hernia repair 04/15/2021   Wears glasses    Past Surgical History:  Procedure Laterality Date   ABDOMINAL HERNIA REPAIR  2012, 2014, 2017   ventral   BRONCHIAL BIOPSY  04/08/2020   Procedure: BRONCHIAL BIOPSIES;  Surgeon: Chilton Greathouse, MD;  Location: [facility];  Service: Cardiopulmonary;;   BRONCHIAL BRUSHINGS  04/08/2020   Procedure: BRONCHIAL BRUSHINGS;  Surgeon: Chilton Greathouse, MD;  Location: [facility];  Service: Cardiopulmonary;;   BRONCHIAL WASHINGS  04/08/2020   Procedure: BRONCHIAL WASHINGS;  Surgeon: Chilton Greathouse, MD;  Location: [facility];  Service: Cardiopulmonary;;   COLONOSCOPY  2019   ESOPHAGECTOMY  1997   for esophageal cancer   ESOPHAGOGASTRODUODENOSCOPY (EGD) WITH PROPOFOL N/A 06/11/2020   Procedure: ESOPHAGOGASTRODUODENOSCOPY (EGD) WITH PROPOFOL;  Surgeon: Napoleon Form, MD;  Location: [facility];  Service: Endoscopy;  Laterality: N/A;   EYE SURGERY  2\27\2024   Cataracts removed   HERNIA REPAIR  Ventral,incisional 5x & inquinal       [id number]   Last 03/22/2021   INGUINAL HERNIA REPAIR Left 03/22/2021   Procedure: OPEN REPAIR LEFT INGUINAL HERNIA WITH MESH;  Surgeon: Darnell Level, MD;  Location: [facility];  Service: General;  Laterality: Left;   PARATHYROIDECTOMY Right 01/26/2020   Procedure: RIGHT INFERIOR PARATHYROIDECTOMY;  Surgeon: Darnell Level, MD;  Location: [facility];  Service: General;  Laterality: Right;   SMALL INTESTINE SURGERY  01/06/2012   SPINAL FUSION  2018, 2020    L2 L5 in 2018, L5-S1 2020   VIDEO BRONCHOSCOPY N/A 04/08/2020   Procedure: VIDEO BRONCHOSCOPY WITH FLUORO;  Surgeon: Chilton Greathouse, MD;  Location: [facility];  Service: Cardiopulmonary;  Laterality: N/A;   Family History  Problem Relation Age of Onset   GER disease Mother    Cancer Mother    Emphysema Father        smoker   Lung cancer Sister        never smoked   GER disease Daughter    GER disease Son    Colon cancer Neg Hx    Stomach cancer Neg Hx    Esophageal cancer Neg Hx    Social History   Socioeconomic History   Marital status: Married    Spouse name: Britta Mccreedy   Number of children: 2   Years of education: 16   Highest education level: Bachelor's degree (e.g., BA, AB, BS)  Occupational History   Occupation: retired    Associate Professor: [employer]  Tobacco Use   Smoking status:  Never   Smokeless tobacco: Never  Vaping Use   Vaping Use: Never used  Substance and Sexual Activity   Alcohol use: Not Currently    Comment: Stopped a year ago   Drug use: Never   Sexual activity: Yes    Birth control/protection: None  Other Topics Concern   Not on file  Social History Narrative   Patient lives with his wife in [place].    Patient has one son in [place] and one daughter in [place].    Patient walks daily at local parks/trails around [place].    Patient has one dog.    Social Determinants of Health   Financial Resource Strain: Low Risk  (11/04/2022)   Overall Financial Resource Strain (CARDIA)    Difficulty of Paying Living Expenses: Not hard at all  Food Insecurity: No Food Insecurity (11/04/2022)   Hunger Vital Sign    Worried About Running Out of Food in the Last Year: Never true    Ran Out of Food in the Last Year: Never true  Transportation Needs: No Transportation Needs (11/04/2022)   PRAPARE - Administrator, Civil Service (Medical): No    Lack of Transportation (Non-Medical): No  Physical Activity: Sufficiently Active (11/04/2022)   Exercise Vital  Sign    Days of Exercise per Week: 5 days    Minutes of Exercise per Session: 60 min  Stress: No Stress Concern Present (11/04/2022)   [employer] of Occupational Health - Occupational Stress Questionnaire    Feeling of Stress : Not at all  Social Connections: Socially Integrated (11/04/2022)   Social Connection and Isolation Panel [NHANES]    Frequency of Communication with Friends and Family: More than three times a week    Frequency of Social Gatherings with Friends and Family: More than three times a week    Attends Religious Services: More than 4 times per year    Active Member of [employer] or Organizations: Yes    Attends Engineer, structural: More than 4 times per year    Marital Status: Married    Tobacco Counseling Counseling given: Not Answered   Clinical Intake:  Pre-visit preparation completed: Yes  Pain : No/denies pain  Diabetes: No  How often do you need to have someone help you when you read instructions, pamphlets, or other written materials from your doctor or pharmacy?: 1 - Never  Diabetic?No   Interpreter Needed?: No  Information entered by :: Kandis Fantasia LPN   Activities of Daily Living    11/04/2022    9:02 PM  In your present state of health, do you have any difficulty performing the following activities:  Hearing? 0  Vision? 0  Difficulty concentrating or making decisions? 0  Walking or climbing stairs? 0  Dressing or bathing? 0  Doing errands, shopping? 0  Preparing Food and eating ? N  Using the Toilet? N  In the past six months, have you accidently leaked urine? N  Do you have problems with loss of bowel control? N  Managing your Medications? N  Managing your Finances? N  Housekeeping or managing your Housekeeping? N    Patient Care Team: Shelby Mattocks, DO as PCP - General (Family Medicine) Little Ishikawa, MD as PCP - Cardiology (Cardiology) Lanier Prude, MD as PCP - Electrophysiology  (Cardiology) Charlott Holler, MD as Consulting Physician (Pulmonary Disease) [facility] Crista Elliot, MD as Consulting Physician (Urology)  Indicate any recent Medical Services you  may have received from other than [facility] providers in the past year (date may be approximate).     Assessment:   This is a routine wellness examination for Kalif.  Hearing/Vision screen Hearing Screening - Comments:: Denies hearing difficulties   Vision Screening - Comments:: Wears rx glasses - up to date with routine eye exams with Dr. Dione Booze    Dietary issues and exercise activities discussed: Current Exercise Habits: Home exercise routine, Type of exercise: walking;stretching;strength training/weights, Time (Minutes): 60, Frequency (Times/Week): 5, Weekly Exercise (Minutes/Week): 300, Intensity: Moderate   Goals Addressed             This Visit's Progress    Remain active and independent        Depression Screen    11/04/2022    9:00 PM 06/10/2021    2:37 PM 05/05/2021    1:59 PM 04/07/2021   10:23 AM 03/22/2020   10:43 AM 11/28/2019    9:11 AM 09/30/2019    8:32 AM  PHQ 2/9 Scores  PHQ - 2 Score 0 0 3 0 0 0 1  PHQ- 9 Score  4 10  5       Fall Risk    11/04/2022    8:59 PM 05/05/2021    1:58 PM 04/07/2021   10:27 AM 11/28/2019    9:12 AM 09/30/2019    8:33 AM  Fall Risk   Falls in the past year? 0 0 0 0 1  Number falls in past yr: 0 0  0 1  Injury with Fall? 0 0   0  Risk for fall due to : No Fall Risks  No Fall Risks    Follow up Falls prevention discussed;Education provided;Falls evaluation completed  Falls prevention discussed Falls evaluation completed Follow up appointment  Comment     MD informed    FALL RISK PREVENTION PERTAINING TO THE HOME:  Any stairs in or around the home? Yes  If so, are there any without handrails? No  Home free of loose throw rugs in walkways, pet beds, electrical cords, etc? Yes  Adequate lighting in your home to reduce risk of  falls? Yes   ASSISTIVE DEVICES UTILIZED TO PREVENT FALLS:  Life alert? No  Use of a cane, walker or w/c? No  Grab bars in the bathroom? Yes  Shower chair or bench in shower? No  Elevated toilet seat or a handicapped toilet? Yes   TIMED UP AND GO:  Was the test performed? No . Telephonic visit   Cognitive Function:        11/04/2022    9:03 PM 04/07/2021   10:29 AM  6CIT Screen  What Year? 0 points 0 points  What month? 0 points 0 points  What time? 0 points 0 points  Count back from 20 0 points 0 points  Months in reverse 0 points 0 points  Repeat phrase 0 points 0 points  Total Score 0 points 0 points    Immunizations Immunization History  Administered Date(s) Administered   Fluad Quad(high Dose 65+) 04/23/2019, 03/17/2020, 04/03/2022   Influenza Inj Mdck Quad Pf 07/04/2017, 06/11/2018   Influenza, Quadrivalent, Recombinant, Inj, Pf 04/21/2019   Influenza,inj,Quad PF,6+ Mos 07/04/2017, 06/11/2018   Influenza-Unspecified 04/28/2019, 03/03/2020, 02/10/2021   PFIZER Comirnaty(Gray Top)Covid-19 Tri-Sucrose Vaccine 11/26/2019, 12/17/2019   PFIZER(Purple Top)SARS-COV-2 Vaccination 11/26/2019, 12/17/2019, 06/09/2020, 02/10/2021   PNEUMOCOCCAL CONJUGATE-20 04/03/2022   Pneumococcal Conjugate-13 03/22/2020   Pneumococcal Polysaccharide-23 04/06/2021   Tdap 03/22/2020    TDAP status: Up to date  Flu Vaccine status: Up to date  Pneumococcal vaccine status: Up to date  Covid-19 vaccine status: Information provided on how to obtain vaccines.   Qualifies for Shingles Vaccine? Yes   Zostavax completed No   Shingrix Completed?: No.    Education has been provided regarding the importance of this vaccine. Patient has been advised to call insurance company to determine out of pocket expense if they have not yet received this vaccine. Advised may also receive vaccine at local pharmacy or Health Dept. Verbalized acceptance and understanding.  Screening Tests Health Maintenance   Topic Date Due   Zoster Vaccines- Shingrix (1 of 2) Never done   COVID-19 Vaccine (7 - 2023-24 season) 02/24/2022   INFLUENZA VACCINE  01/25/2023   Medicare Annual Wellness (AWV)  11/03/2023   DTaP/Tdap/Td (2 - Td or Tdap) 03/22/2030   Pneumonia Vaccine 64+ Years old  Completed   Hepatitis C Screening  Completed   HPV VACCINES  Aged Out   COLONOSCOPY (Pts 45-1yrs Insurance coverage will need to be confirmed)  Discontinued    Health Maintenance  Health Maintenance Due  Topic Date Due   Zoster Vaccines- Shingrix (1 of 2) Never done   COVID-19 Vaccine (7 - 2023-24 season) 02/24/2022    Colorectal cancer screening: No longer required.   Lung Cancer Screening: (Low Dose CT Chest recommended if Age 4-80 years, 30 pack-year currently smoking OR have quit w/in 15years.) does not qualify.   Lung Cancer Screening Referral: n/a  Additional Screening:  Hepatitis C Screening: does qualify; Completed 09/02/19  Vision Screening: Recommended annual ophthalmology exams for early detection of glaucoma and other disorders of the eye. Is the patient up to date with their annual eye exam?  Yes  Who is the provider or what is the name of the office in which the patient attends annual eye exams? [facility] If pt is not established with a provider, would they like to be referred to a provider to establish care? No .   Dental Screening: Recommended annual dental exams for proper oral hygiene  Community Resource Referral / Chronic Care Management: CRR required this visit?  No   CCM required this visit?  No      Plan:     I have personally reviewed and noted the following in the patient's chart:   Medical and social history Use of alcohol, tobacco or illicit drugs  Current medications and supplements including opioid prescriptions. Patient is not currently taking opioid prescriptions. Functional ability and status Nutritional status Physical activity Advanced directives List of other  physicians Hospitalizations, surgeries, and ER visits in previous 12 months Vitals Screenings to include cognitive, depression, and falls Referrals and appointments  In addition, I have reviewed and discussed with patient certain preventive protocols, quality metrics, and best practice recommendations. A written personalized care plan for preventive services as well as general preventive health recommendations were provided to patient.     Durwin Nora, [place]   03/09/7828   Due to this being a virtual visit, the after visit summary with patients personalized plan was offered to patient via mail or my-chart. Patient would like to access on my-chart  Nurse Notes: No concerns

## 2022-11-04 NOTE — Patient Instructions (Signed)
Thomas Dominguez , Thank you for taking time to come for your Medicare Wellness Visit. I appreciate your ongoing commitment to your health goals. Please review the following plan we discussed and let me know if I can assist you in the future.   These are the goals we discussed:  Goals       DIET - DECREASE SODA OR JUICE INTAKE (pt-stated)      Decrease or eliminate orange and pineapple juice.  Replace with equivalent fruit.       Patient Stated      Maintain current level of physical activity.      Remain active and independent        This is a list of the screening recommended for you and due dates:  Health Maintenance  Topic Date Due   Zoster (Shingles) Vaccine (1 of 2) Never done   COVID-19 Vaccine (7 - 2023-24 season) 02/24/2022   Flu Shot  01/25/2023   Medicare Annual Wellness Visit  11/03/2023   DTaP/Tdap/Td vaccine (2 - Td or Tdap) 03/22/2030   Pneumonia Vaccine  Completed   Hepatitis C Screening: USPSTF Recommendation to screen - Ages 37-79 yo.  Completed   HPV Vaccine  Aged Out   Colon Cancer Screening  Discontinued    Advanced directives: Please bring a copy of your health care power of attorney and living will to the office to be added to your chart at your convenience.   Conditions/risks identified: Aim for 30 minutes of exercise or brisk walking, 6-8 glasses of water, and 5 servings of fruits and vegetables each day.   Next appointment: Follow up in one year for your annual wellness visit.   Preventive Care 7 Years and Older, Male  Preventive care refers to lifestyle choices and visits with your health care provider that can promote health and wellness. What does preventive care include? A yearly physical exam. This is also called an annual well check. Dental exams once or twice a year. Routine eye exams. Ask your health care provider how often you should have your eyes checked. Personal lifestyle choices, including: Daily care of your teeth and gums. Regular  physical activity. Eating a healthy diet. Avoiding tobacco and drug use. Limiting alcohol use. Practicing safe sex. Taking low doses of aspirin every day. Taking vitamin and mineral supplements as recommended by your health care provider. What happens during an annual well check? The services and screenings done by your health care provider during your annual well check will depend on your age, overall health, lifestyle risk factors, and family history of disease. Counseling  Your health care provider may ask you questions about your: Alcohol use. Tobacco use. Drug use. Emotional well-being. Home and relationship well-being. Sexual activity. Eating habits. History of falls. Memory and ability to understand (cognition). Work and work Astronomer. Screening  You may have the following tests or measurements: Height, weight, and BMI. Blood pressure. Lipid and cholesterol levels. These may be checked every 5 years, or more frequently if you are over 25 years old. Skin check. Lung cancer screening. You may have this screening every year starting at age 6 if you have a 30-pack-year history of smoking and currently smoke or have quit within the past 15 years. Fecal occult blood test (FOBT) of the stool. You may have this test every year starting at age 64. Flexible sigmoidoscopy or colonoscopy. You may have a sigmoidoscopy every 5 years or a colonoscopy every 10 years starting at age 59. Prostate cancer screening.  Recommendations will vary depending on your family history and other risks. Hepatitis C blood test. Hepatitis B blood test. Sexually transmitted disease (STD) testing. Diabetes screening. This is done by checking your blood sugar (glucose) after you have not eaten for a while (fasting). You may have this done every 1-3 years. Abdominal aortic aneurysm (AAA) screening. You may need this if you are a current or former smoker. Osteoporosis. You may be screened starting at age 46  if you are at high risk. Talk with your health care provider about your test results, treatment options, and if necessary, the need for more tests. Vaccines  Your health care provider may recommend certain vaccines, such as: Influenza vaccine. This is recommended every year. Tetanus, diphtheria, and acellular pertussis (Tdap, Td) vaccine. You may need a Td booster every 10 years. Zoster vaccine. You may need this after age 38. Pneumococcal 13-valent conjugate (PCV13) vaccine. One dose is recommended after age 63. Pneumococcal polysaccharide (PPSV23) vaccine. One dose is recommended after age 47. Talk to your health care provider about which screenings and vaccines you need and how often you need them. This information is not intended to replace advice given to you by your health care provider. Make sure you discuss any questions you have with your health care provider. Document Released: 07/09/2015 Document Revised: 03/01/2016 Document Reviewed: 04/13/2015 Elsevier Interactive Patient Education  2017 [place] Prevention in the Home Falls can cause injuries. They can happen to people of all ages. There are many things you can do to make your home safe and to help prevent falls. What can I do on the outside of my home? Regularly fix the edges of walkways and driveways and fix any cracks. Remove anything that might make you trip as you walk through a door, such as a raised step or threshold. Trim any bushes or trees on the path to your home. Use bright outdoor lighting. Clear any walking paths of anything that might make someone trip, such as rocks or tools. Regularly check to see if handrails are loose or broken. Make sure that both sides of any steps have handrails. Any raised decks and porches should have guardrails on the edges. Have any leaves, snow, or ice cleared regularly. Use sand or salt on walking paths during winter. Clean up any spills in your garage right away. This  includes oil or grease spills. What can I do in the bathroom? Use night lights. Install grab bars by the toilet and in the tub and shower. Do not use towel bars as grab bars. Use non-skid mats or decals in the tub or shower. If you need to sit down in the shower, use a plastic, non-slip stool. Keep the floor dry. Clean up any water that spills on the floor as soon as it happens. Remove soap buildup in the tub or shower regularly. Attach bath mats securely with double-sided non-slip rug tape. Do not have throw rugs and other things on the floor that can make you trip. What can I do in the bedroom? Use night lights. Make sure that you have a light by your bed that is easy to reach. Do not use any sheets or blankets that are too big for your bed. They should not hang down onto the floor. Have a firm chair that has side arms. You can use this for support while you get dressed. Do not have throw rugs and other things on the floor that can make you trip. What can I  do in the kitchen? Clean up any spills right away. Avoid walking on wet floors. Keep items that you use a lot in easy-to-reach places. If you need to reach something above you, use a strong step stool that has a grab bar. Keep electrical cords out of the way. Do not use floor polish or wax that makes floors slippery. If you must use wax, use non-skid floor wax. Do not have throw rugs and other things on the floor that can make you trip. What can I do with my stairs? Do not leave any items on the stairs. Make sure that there are handrails on both sides of the stairs and use them. Fix handrails that are broken or loose. Make sure that handrails are as long as the stairways. Check any carpeting to make sure that it is firmly attached to the stairs. Fix any carpet that is loose or worn. Avoid having throw rugs at the top or bottom of the stairs. If you do have throw rugs, attach them to the floor with carpet tape. Make sure that you  have a light switch at the top of the stairs and the bottom of the stairs. If you do not have them, ask someone to add them for you. What else can I do to help prevent falls? Wear shoes that: Do not have high heels. Have rubber bottoms. Are comfortable and fit you well. Are closed at the toe. Do not wear sandals. If you use a stepladder: Make sure that it is fully opened. Do not climb a closed stepladder. Make sure that both sides of the stepladder are locked into place. Ask someone to hold it for you, if possible. Clearly mark and make sure that you can see: Any grab bars or handrails. First and last steps. Where the edge of each step is. Use tools that help you move around (mobility aids) if they are needed. These include: Canes. Walkers. Scooters. Crutches. Turn on the lights when you go into a dark area. Replace any light bulbs as soon as they burn out. Set up your furniture so you have a clear path. Avoid moving your furniture around. If any of your floors are uneven, fix them. If there are any pets around you, be aware of where they are. Review your medicines with your doctor. Some medicines can make you feel dizzy. This can increase your chance of falling. Ask your doctor what other things that you can do to help prevent falls. This information is not intended to replace advice given to you by your health care provider. Make sure you discuss any questions you have with your health care provider. Document Released: 04/08/2009 Document Revised: 11/18/2015 Document Reviewed: 07/17/2014 Elsevier Interactive Patient Education  2017 [employer].

## 2022-11-13 DIAGNOSIS — M461 Sacroiliitis, not elsewhere classified: Secondary | ICD-10-CM | POA: Diagnosis not present

## 2022-11-18 NOTE — Progress Notes (Unsigned)
    SUBJECTIVE:   Chief compliant/HPI: annual examination  Thomas Dominguez is a 77 y.o. who presents today for an annual exam.   History tabs reviewed and updated.   OBJECTIVE:  There were no vitals taken for this visit.  ***  ASSESSMENT/PLAN:  There are no diagnoses linked to this encounter.    Annual Examination  See AVS for age appropriate recommendations.  PHQ score ***, reviewed and discussed.  Blood pressure value is *** goal, discussed.   Considered the following screening exams based upon USPSTF recommendations: Diabetes screening: {discussed/ordered:14545} Screening for elevated cholesterol: {discussed/ordered:14545} HIV testing: {discussed/ordered:14545} Hepatitis C: {discussed/ordered:14545} Hepatitis B: {discussed/ordered:14545} Syphilis if at high risk: {discussed/ordered:14545} Reviewed risk factors for latent tuberculosis and {not indicated/requested/declined:14582} Colorectal cancer screening: not applicable given age.  Lung cancer screening:  not applicable  See documentation below regarding discussion and indication.  PSA discussed and after engaging in discussion of possible risks, benefits and complications of screening patient elected to ***.   Follow up in 1 year or sooner if indicated.    Shelby Mattocks, DO [facility]

## 2022-11-21 ENCOUNTER — Encounter: Payer: Self-pay | Admitting: Student

## 2022-11-21 ENCOUNTER — Ambulatory Visit (INDEPENDENT_AMBULATORY_CARE_PROVIDER_SITE_OTHER): Payer: Medicare HMO | Admitting: Student

## 2022-11-21 VITALS — BP 122/74 | HR 87 | Ht 72.0 in | Wt 203.0 lb

## 2022-11-21 DIAGNOSIS — N401 Enlarged prostate with lower urinary tract symptoms: Secondary | ICD-10-CM | POA: Diagnosis not present

## 2022-11-21 DIAGNOSIS — F39 Unspecified mood [affective] disorder: Secondary | ICD-10-CM | POA: Diagnosis not present

## 2022-11-21 DIAGNOSIS — N3943 Post-void dribbling: Secondary | ICD-10-CM | POA: Diagnosis not present

## 2022-11-21 NOTE — Patient Instructions (Signed)
It was great to see you today! Thank you for choosing [facility] for your primary care. Thomas Dominguez was seen for annual physical.  Today we addressed: Please let me know if you need another referral to urology or any medication refills in the future.  You can get the Shingrix vaccine at the pharmacy. You will need a 2nd shot 2-6 months after the first. This vaccine is important to help prevent shingles.    If you haven't already, sign up for My Chart to have easy access to your labs results, and communication with your primary care physician.  You should return to our clinic No follow-ups on file. Please arrive 15 minutes before your appointment to ensure smooth check in process.  We appreciate your efforts in making this happen.  Thank you for allowing me to participate in your care, Shelby Mattocks, DO 11/21/2022, 1:55 PM PGY-2, [facility]

## 2022-11-22 NOTE — Assessment & Plan Note (Signed)
Well controlled with Effexor 150mg  daily. PHQ-9 score of 1.

## 2022-11-22 NOTE — Assessment & Plan Note (Signed)
Continue combination tamsulosin and finasteride. Advise Urology follow up if worsens.

## 2022-11-28 ENCOUNTER — Other Ambulatory Visit: Payer: Self-pay | Admitting: Student

## 2022-11-28 DIAGNOSIS — N401 Enlarged prostate with lower urinary tract symptoms: Secondary | ICD-10-CM

## 2022-12-11 DIAGNOSIS — M461 Sacroiliitis, not elsewhere classified: Secondary | ICD-10-CM | POA: Diagnosis not present

## 2022-12-15 ENCOUNTER — Other Ambulatory Visit: Payer: Self-pay | Admitting: Student

## 2022-12-15 DIAGNOSIS — N401 Enlarged prostate with lower urinary tract symptoms: Secondary | ICD-10-CM

## 2023-01-01 DIAGNOSIS — M461 Sacroiliitis, not elsewhere classified: Secondary | ICD-10-CM | POA: Diagnosis not present

## 2023-01-01 DIAGNOSIS — Z6827 Body mass index (BMI) 27.0-27.9, adult: Secondary | ICD-10-CM | POA: Diagnosis not present

## 2023-01-11 ENCOUNTER — Other Ambulatory Visit: Payer: Self-pay | Admitting: Cardiology

## 2023-01-18 ENCOUNTER — Other Ambulatory Visit: Payer: Self-pay | Admitting: Neurological Surgery

## 2023-01-18 DIAGNOSIS — M461 Sacroiliitis, not elsewhere classified: Secondary | ICD-10-CM

## 2023-01-30 ENCOUNTER — Encounter: Payer: Self-pay | Admitting: Neurological Surgery

## 2023-02-07 ENCOUNTER — Ambulatory Visit
Admission: RE | Admit: 2023-02-07 | Discharge: 2023-02-07 | Disposition: A | Discharge status: Home / Self Care | Payer: Medicare HMO | Source: Ambulatory Visit | Attending: Neurological Surgery | Admitting: Neurological Surgery

## 2023-02-07 ENCOUNTER — Inpatient Hospital Stay: Admission: RE | Admit: 2023-02-07 | Payer: Medicare HMO | Source: Ambulatory Visit

## 2023-02-07 DIAGNOSIS — M533 Sacrococcygeal disorders, not elsewhere classified: Secondary | ICD-10-CM | POA: Diagnosis not present

## 2023-02-07 DIAGNOSIS — M461 Sacroiliitis, not elsewhere classified: Secondary | ICD-10-CM

## 2023-02-08 DIAGNOSIS — I480 Paroxysmal atrial fibrillation: Secondary | ICD-10-CM | POA: Diagnosis not present

## 2023-02-08 DIAGNOSIS — K311 Adult hypertrophic pyloric stenosis: Secondary | ICD-10-CM | POA: Diagnosis not present

## 2023-02-08 DIAGNOSIS — C159 Malignant neoplasm of esophagus, unspecified: Secondary | ICD-10-CM | POA: Diagnosis not present

## 2023-02-08 DIAGNOSIS — J849 Interstitial pulmonary disease, unspecified: Secondary | ICD-10-CM | POA: Diagnosis not present

## 2023-02-08 DIAGNOSIS — K56600 Partial intestinal obstruction, unspecified as to cause: Secondary | ICD-10-CM | POA: Diagnosis not present

## 2023-02-08 DIAGNOSIS — E7800 Pure hypercholesterolemia, unspecified: Secondary | ICD-10-CM | POA: Diagnosis not present

## 2023-02-08 DIAGNOSIS — Z01818 Encounter for other preprocedural examination: Secondary | ICD-10-CM | POA: Diagnosis not present

## 2023-02-08 DIAGNOSIS — K2100 Gastro-esophageal reflux disease with esophagitis, without bleeding: Secondary | ICD-10-CM | POA: Diagnosis not present

## 2023-02-08 DIAGNOSIS — K449 Diaphragmatic hernia without obstruction or gangrene: Secondary | ICD-10-CM | POA: Diagnosis not present

## 2023-02-11 ENCOUNTER — Encounter: Payer: Self-pay | Admitting: Gastroenterology

## 2023-02-14 ENCOUNTER — Other Ambulatory Visit: Payer: Self-pay | Admitting: Student

## 2023-02-14 ENCOUNTER — Other Ambulatory Visit: Payer: Self-pay | Admitting: Gastroenterology

## 2023-02-14 DIAGNOSIS — M5136 Other intervertebral disc degeneration, lumbar region: Secondary | ICD-10-CM

## 2023-02-15 MED ORDER — TRAMADOL HCL 50 MG PO TABS: 50.0000 mg | ORAL_TABLET | Freq: Two times a day (BID) | ORAL | 0 refills | Status: DC | PRN

## 2023-02-20 ENCOUNTER — Encounter: Payer: Self-pay | Admitting: Internal Medicine

## 2023-02-20 ENCOUNTER — Ambulatory Visit: Payer: Medicare HMO | Attending: Internal Medicine | Admitting: Internal Medicine

## 2023-02-20 VITALS — BP 120/70 | HR 80 | Ht 72.0 in | Wt 201.8 lb

## 2023-02-20 DIAGNOSIS — I700 Atherosclerosis of aorta: Secondary | ICD-10-CM | POA: Diagnosis not present

## 2023-02-20 DIAGNOSIS — I471 Supraventricular tachycardia, unspecified: Secondary | ICD-10-CM

## 2023-02-20 DIAGNOSIS — R0609 Other forms of dyspnea: Secondary | ICD-10-CM

## 2023-02-20 DIAGNOSIS — E785 Hyperlipidemia, unspecified: Secondary | ICD-10-CM | POA: Diagnosis not present

## 2023-02-20 DIAGNOSIS — I480 Paroxysmal atrial fibrillation: Secondary | ICD-10-CM | POA: Diagnosis not present

## 2023-02-20 NOTE — Progress Notes (Signed)
Cardiology Office Note:    Date:  02/20/2023   ID:  Windy Fast L. Tyshun, [place] Apr 19, 1946, MRN [id number]  PCP:  Shelby Mattocks, DO  Cardiologist:  Parke Poisson, MD  Electrophysiologist:  Lanier Prude, MD   Referring MD: Shelby Mattocks, DO   Chief complaint: AF  History of Present Illness:    Thomas Dominguez is a 77 y.o. male with a hx of atrial fibrillation, esophageal cancer status post esophagectomy, BPH, depression, degenerative disc disease who presents for follow-up.  He was referred by Dr. Manson Passey for evaluation of atrial fibrillation, initially seen on 03/18/2019.  He reports that he was diagnosed with atrial fibrillation in 2013.  He was previously seeing a cardiologist in [place], [place].  He had been alternating stress test and echo every other year.  Has been on propafenone and diltiazem since 2013.  States that he always feels it when he goes into AF.  Will occur every 3-4 days, can last up to 1 hour.  Feels short of breath when in AF.  Had back surgery in June 2020, has been improving.  States that walks 1.5 miles per day.  Has been having dyspnea with exertion but able to complete his walks.  He reports that he checks his BP at home, has always been normal.   TTE 04/02/2019 showed EF 55 to 60%, grade 2 diastolic dysfunction, normal RV function, no significant valvular disease.  14-day Zio patch showed no recurrent atrial fibrillation but did show 20 episodes of SVT, longest lasting 11 seconds.  Triggered events corresponded to sinus rhythm and rare PVCs.  Calcium score 0 on 03/12/2020.  Underwent bronchospy in October 2021 which was complicated by pneumothorax.  Biopsy was negative. 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  He was admitted 7/10 through 01/04/2021 with small bowel obstruction.  He has had 5 prior episodes of small bowel obstruction, thought to be due to from prior scar tissue and resolves with NG tube  decompression.  He was seen at [facility] for [place] evaluation, he is considering.  Reports no atrial fibrillation since he has been back on propafenone.  Denies any bleeding on Eliquis.  Denies any chest pain, lower extremity edema, or palpitations.    No significant change since last visit with Dr. Bjorn Pippin 07/2022. Feels well overall and is anticipating GI surgery on "pyloric valve" at [facility] on Thursday 02/22/23.    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/17/2019   Displacement of lumbar intervertebral disc without myelopathy 04/22/2010   Diverticulosis 01/17/2019   Dysrhythmia    Dysrhythmias 12/19/2011   Esophageal cancer (HCC)    GERD (gastroesophageal reflux disease)    H/O small bowel obstruction 01/17/2019   Hiatal hernia 01/17/2019   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 microfocus of cancer 11/22/2019   Hyperlipidemia 2020   Hypertension    ILD (interstitial lung disease) (HCC)    Inguinal hernia    bilateral, left more prominant   Left inguinal hernia 03/21/2021   Leg swelling 01/17/2019   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HCC)    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   Pneumonia 11/2019   Pneumothorax 04/08/2020  SBO (small bowel obstruction) (HCC) 11/20/2019   Status post left inguinal hernia repair 04/15/2021   Wears glasses     Past Surgical History:  Procedure Laterality Date   ABDOMINAL HERNIA REPAIR  2012, 2014, 2017   ventral   BRONCHIAL BIOPSY  04/08/2020   Procedure: BRONCHIAL BIOPSIES;  Surgeon: Chilton Greathouse, MD;  Location: [facility];  Service: Cardiopulmonary;;   BRONCHIAL BRUSHINGS  04/08/2020   Procedure: BRONCHIAL  BRUSHINGS;  Surgeon: Chilton Greathouse, MD;  Location: [facility];  Service: Cardiopulmonary;;   BRONCHIAL WASHINGS  04/08/2020   Procedure: BRONCHIAL WASHINGS;  Surgeon: Chilton Greathouse, MD;  Location: [facility];  Service: Cardiopulmonary;;   COLONOSCOPY  2019   ESOPHAGECTOMY  1997   for esophageal cancer   ESOPHAGOGASTRODUODENOSCOPY (EGD) WITH PROPOFOL N/A 06/11/2020   Procedure: ESOPHAGOGASTRODUODENOSCOPY (EGD) WITH PROPOFOL;  Surgeon: Napoleon Form, MD;  Location: [facility];  Service: Endoscopy;  Laterality: N/A;   EYE SURGERY  2\27\2024   Cataracts removed   HERNIA REPAIR  Ventral,incisional 5x & inquinal       [id number]   Last 03/22/2021   INGUINAL HERNIA REPAIR Left 03/22/2021   Procedure: OPEN REPAIR LEFT INGUINAL HERNIA WITH MESH;  Surgeon: Darnell Level, MD;  Location: [facility];  Service: General;  Laterality: Left;   PARATHYROIDECTOMY Right 01/26/2020   Procedure: RIGHT INFERIOR PARATHYROIDECTOMY;  Surgeon: Darnell Level, MD;  Location: [facility];  Service: General;  Laterality: Right;   SMALL INTESTINE SURGERY  01/06/2012   SPINAL FUSION  2018, 2020   L2 L5 in 2018, L5-S1 2020   VIDEO BRONCHOSCOPY N/A 04/08/2020   Procedure: VIDEO BRONCHOSCOPY WITH FLUORO;  Surgeon: Chilton Greathouse, MD;  Location: [facility];  Service: Cardiopulmonary;  Laterality: N/A;    Current Medications: No outpatient medications have been marked as taking for the 02/20/23 encounter (Office Visit) with Parke Poisson, MD.     Allergies:   Mobic [meloxicam] and Statins   Social History   Socioeconomic History   Marital status: Married    Spouse name: Britta Mccreedy   Number of children: 2   Years of education: 16   Highest education level: Bachelor's degree (e.g., BA, AB, BS)  Occupational History   Occupation: retired    Associate Professor: [employer]  Tobacco Use   Smoking status: Never   Smokeless tobacco: Never  Vaping Use   Vaping status: Never Used  Substance and Sexual Activity   Alcohol  use: Not Currently    Comment: Stopped a year ago   Drug use: Never   Sexual activity: Yes    Birth control/protection: None  Other Topics Concern   Not on file  Social History Narrative   Patient lives with his wife in [place].    Patient has one son in [place] and one daughter in [place].    Patient walks daily at local parks/trails around [place] at [place].    Patient has one dog.    Social Determinants of Health   Financial Resource Strain: Low Risk  (11/04/2022)   Overall Financial Resource Strain (CARDIA)    Difficulty of Paying Living Expenses: Not hard at all  Food Insecurity: No Food Insecurity (11/04/2022)   Hunger Vital Sign    Worried About Running Out of Food in the Last Year: Never true    Ran Out of Food in the Last Year: Never true  Transportation Needs: No Transportation Needs (11/04/2022)   PRAPARE - Administrator, Civil Service (Medical): No    Lack of Transportation (Non-Medical):  No  Physical Activity: Sufficiently Active (11/04/2022)   Exercise Vital Sign    Days of Exercise per Week: 5 days    Minutes of Exercise per Session: 60 min  Stress: No Stress Concern Present (11/04/2022)   [employer] of Occupational Health - Occupational Stress Questionnaire    Feeling of Stress : Not at all  Social Connections: Socially Integrated (11/04/2022)   Social Connection and Isolation Panel [NHANES]    Frequency of Communication with Friends and Family: More than three times a week    Frequency of Social Gatherings with Friends and Family: More than three times a week    Attends Religious Services: More than 4 times per year    Active Member of [employer] or Organizations: Yes    Attends Engineer, structural: More than 4 times per year    Marital Status: Married     Family History: The patient's family history includes Cancer in his mother; Emphysema in his father; GER disease in his daughter, mother, and son; Lung cancer in his sister. There is no history of  Colon cancer, Stomach cancer, or Esophageal cancer.  ROS:   Please see the history of present illness.     All other systems reviewed and are negative.  EKGs/Labs/Other Studies Reviewed:    The following studies were reviewed today:  Coronary CTA 07/2022 - cal score 0, mild CAD. Discussed myocardial bridge.    EKG:    02/20/23: SR, poor R wave progression. 07/31/2022: Normal sinus rhythm, rate 79, no ST abnormalities 08/01/21: Normal sinus rhythm, rate 86, no ST abnormalities 07/22: normal sinus rhythm 1st degree AV block, rate 67, no ST abnormalities 05/18/20: NSR, rate 70, no ST/T changes   Cardiac monitor 04/22/19: No atrial fibrillation seen. 20 episodes of SVT, longest lasting 11 seconds.   13 days of data recorded on Zio monitor. Patient had a min HR of 59 bpm, max HR of 174 bpm, and avg HR of 87 bpm. Predominant underlying rhythm was Sinus Rhythm. No VT, atrial fibrillation, high degree block, or pauses noted. Isolated atrial and ventricular ectopy was rare (<1%). 20 episodes of of SVT, longest lasting 11 seconds (rate 144 bpm); fastest was rate 174 bpm, lasting 12 beats.There were 8 triggered events, which corresponded to sinus rhythm and rare PVCs. No significant arrhythmias detected.  TTE 04/02/19:  1. Left ventricular ejection fraction, by visual estimation, is 55 to 60%. The left ventricle has normal function. There is no left ventricular hypertrophy.  2. Left ventricular diastolic Doppler parameters are consistent with pseudonormalization pattern of LV diastolic filling.  3. Global right ventricle has normal systolic function.The right ventricular size is mildly enlarged. No increase in right ventricular wall thickness.  4. Left atrial size was normal.  5. Right atrial size was normal.  6. The mitral valve is normal in structure. No evidence of mitral valve regurgitation. No evidence of mitral stenosis.  7. The tricuspid valve is normal in structure. Tricuspid valve  regurgitation is trivial.  8. The aortic valve is normal in structure. Aortic valve regurgitation was not visualized by color flow Doppler.  9. The pulmonic valve was normal in structure. Pulmonic valve regurgitation is not visualized by color flow Doppler. 10. Normal pulmonary artery systolic pressure. 11. The atrial septum is grossly normal.  Recent Labs: 07/31/2022: BUN 13; Creatinine, Ser 1.00; Potassium 4.7; Sodium 136  Recent Lipid Panel    Component Value Date/Time   CHOL 207 (H) 07/31/2022 1042   TRIG 205 (H)  07/31/2022 1042   HDL 68 07/31/2022 1042   CHOLHDL 3.0 07/31/2022 1042   LDLCALC 104 (H) 07/31/2022 1042    Physical Exam:    VS:  BP 120/70 (BP Location: Right Arm, Patient Position: Sitting, Cuff Size: Normal)   Pulse 80   Ht 6' (1.829 m)   Wt 201 lb 12.8 oz (91.5 kg)   SpO2 97%   BMI 27.37 kg/m     Wt Readings from Last 3 Encounters:  02/20/23 201 lb 12.8 oz (91.5 kg)  11/21/22 203 lb (92.1 kg)  11/04/22 204 lb (92.5 kg)     Constitutional: No acute distress Eyes: sclera non-icteric, normal conjunctiva and lids ENMT: normal dentition, moist mucous membranes Cardiovascular: regular rhythm, normal rate, no murmur. S1 and S2 normal. No jugular venous distention.  Respiratory: clear to auscultation bilaterally GI : normal bowel sounds, soft and nontender. No distention.   MSK: extremities warm, well perfused. No edema.  NEURO: grossly nonfocal exam, moves all extremities. PSYCH: alert and oriented x 3, normal mood and affect.    ASSESSMENT:    1. Paroxysmal atrial fibrillation (HCC)   2. SVT (supraventricular tachycardia)   3. DOE (dyspnea on exertion)   4. Hyperlipidemia, unspecified hyperlipidemia type   5. Aortic atherosclerosis (HCC)    PLAN:     Atrial fibrillation: paroxysmal, on propafenone 225 mg 3 times daily and metoprolol 25 mg daily XL.  CHADS-VASc 2 given age.  Cardiac monitor 04/22/2019 shows no evidence of recurrent A. Fib. TTE on  04/02/2019 showed normal biventricular function, no significant valvular disease - Continue Eliquis 5 mg twice daily - On propafenone 225 mg 3 times daily - Continue Toprol-XL 25 mg daily - Per Dr. Bjorn Pippin, he requested referral to Dr. Lalla Brothers for [facility] evaluation.  Not a good candidate given esophageal cancer status post esophagectomy.  Dr. Lalla Brothers did state that they may move towards procedure being guided by intracardiac echo, but for now is guided by transesophageal echo so patient is not a candidate for watchman at [facility]; he was referred to [facility] for further evaluation, decided not to pursue  SVT: Having intermittent episodes on monitor, longest lasting up to 11 seconds.  Reports palpitations have resolved.  Continue Toprol-XL and propafenone as above  Dyspnea: Following with pulmonology, CT chest 11/30/2019 with findings consistent with sequela of prior inflammation.  PFTs on 12/12/2019 with findings suggestive of restriction but normal lung volumes.  Diastolic dysfunction could be contributing, though BNP checked at prior clinic visit and was unremarkable (21).  Underwent bronchoscopy with biopsy, which was unremarkable.  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   -for DOE he underwent CCTA which showed mild nonobstructive CAD and cal score zero.  Hyperlipidemia: LDL 104. Will recheck today. Statin intolerant. Would like to consider other therapies if LDL elevated. Discuss after labs.     Medication Adjustments/Labs and Tests Ordered: Current medicines are reviewed at length with the patient today.  Concerns regarding medicines are outlined above.  Orders Placed This Encounter  Procedures   Lipid panel   EKG 12-Lead    No orders of the defined types were placed in this encounter.    Patient Instructions  Medication Instructions:  Your physician recommends that you continue on your current medications  as directed. Please refer to the Current Medication list given to you today.   *If you need a refill on your cardiac medications before your next appointment, please call your  pharmacy*   Lab Work: Your physician recommends that you return for lab work in: Today     If you have labs (blood work) drawn today and your tests are completely normal, you will receive your results only by: MyChart Message (if you have MyChart) OR A paper copy in the mail If you have any lab test that is abnormal or we need to change your treatment, we will call you to review the results.   Testing/Procedures: NONE    Follow-Up: At [facility], you and your health needs are our priority.  As part of our continuing mission to provide you with exceptional heart care, we have created designated Provider Care Teams.  These Care Teams include your primary Cardiologist (physician) and Advanced Practice Providers (APPs -  Physician Assistants and Nurse Practitioners) who all work together to provide you with the care you need, when you need it.  We recommend signing up for the patient portal called "MyChart".  Sign up information is provided on this After Visit Summary.  MyChart is used to connect with patients for Virtual Visits (Telemedicine).  Patients are able to view lab/test results, encounter notes, upcoming appointments, etc.  Non-urgent messages can be sent to your provider as well.   To learn more about what you can do with MyChart, go to [email or web address].    Your next appointment:   6 month(s)  Provider:   Parke Poisson, MD     Other Instructions Thank you for choosing [facility]!

## 2023-02-20 NOTE — Patient Instructions (Signed)
Medication Instructions:  Your physician recommends that you continue on your current medications as directed. Please refer to the Current Medication list given to you today.   *If you need a refill on your cardiac medications before your next appointment, please call your pharmacy*   Lab Work: Your physician recommends that you return for lab work in: Today     If you have labs (blood work) drawn today and your tests are completely normal, you will receive your results only by: MyChart Message (if you have MyChart) OR A paper copy in the mail If you have any lab test that is abnormal or we need to change your treatment, we will call you to review the results.   Testing/Procedures: NONE    Follow-Up: At [facility], you and your health needs are our priority.  As part of our continuing mission to provide you with exceptional heart care, we have created designated Provider Care Teams.  These Care Teams include your primary Cardiologist (physician) and Advanced Practice Providers (APPs -  Physician Assistants and Nurse Practitioners) who all work together to provide you with the care you need, when you need it.  We recommend signing up for the patient portal called "MyChart".  Sign up information is provided on this After Visit Summary.  MyChart is used to connect with patients for Virtual Visits (Telemedicine).  Patients are able to view lab/test results, encounter notes, upcoming appointments, etc.  Non-urgent messages can be sent to your provider as well.   To learn more about what you can do with MyChart, go to [email or web address].    Your next appointment:   6 month(s)  Provider:   Parke Poisson, MD     Other Instructions Thank you for choosing [facility]!

## 2023-02-21 LAB — LIPID PANEL
Chol/HDL Ratio: 3.5 ratio (ref 0.0–5.0)
Cholesterol, Total: 203 mg/dL — ABNORMAL HIGH (ref 100–199)
HDL: 58 mg/dL (ref >39)
LDL Chol Calc (NIH): 102 mg/dL — ABNORMAL HIGH (ref 0–99)
Triglycerides: 252 mg/dL — ABNORMAL HIGH (ref 0–149)
VLDL Cholesterol Cal: 43 mg/dL — ABNORMAL HIGH (ref 5–40)

## 2023-02-22 DIAGNOSIS — Z8501 Personal history of malignant neoplasm of esophagus: Secondary | ICD-10-CM | POA: Diagnosis not present

## 2023-02-22 DIAGNOSIS — Z888 Allergy status to other drugs, medicaments and biological substances status: Secondary | ICD-10-CM | POA: Diagnosis not present

## 2023-02-22 DIAGNOSIS — K3184 Gastroparesis: Secondary | ICD-10-CM | POA: Diagnosis not present

## 2023-02-22 DIAGNOSIS — I480 Paroxysmal atrial fibrillation: Secondary | ICD-10-CM | POA: Diagnosis not present

## 2023-02-22 DIAGNOSIS — Z7901 Long term (current) use of anticoagulants: Secondary | ICD-10-CM | POA: Diagnosis not present

## 2023-02-22 DIAGNOSIS — K219 Gastro-esophageal reflux disease without esophagitis: Secondary | ICD-10-CM | POA: Diagnosis not present

## 2023-02-22 DIAGNOSIS — K311 Adult hypertrophic pyloric stenosis: Secondary | ICD-10-CM | POA: Diagnosis not present

## 2023-02-22 DIAGNOSIS — Z79899 Other long term (current) drug therapy: Secondary | ICD-10-CM | POA: Diagnosis not present

## 2023-02-22 DIAGNOSIS — I10 Essential (primary) hypertension: Secondary | ICD-10-CM | POA: Diagnosis not present

## 2023-02-28 ENCOUNTER — Other Ambulatory Visit: Payer: Self-pay | Admitting: Gastroenterology

## 2023-03-26 DIAGNOSIS — Z6827 Body mass index (BMI) 27.0-27.9, adult: Secondary | ICD-10-CM | POA: Diagnosis not present

## 2023-03-26 DIAGNOSIS — G8929 Other chronic pain: Secondary | ICD-10-CM | POA: Diagnosis not present

## 2023-04-04 DIAGNOSIS — K3184 Gastroparesis: Secondary | ICD-10-CM | POA: Diagnosis not present

## 2023-04-09 ENCOUNTER — Other Ambulatory Visit: Payer: Self-pay

## 2023-04-09 DIAGNOSIS — E785 Hyperlipidemia, unspecified: Secondary | ICD-10-CM

## 2023-04-10 DIAGNOSIS — H43813 Vitreous degeneration, bilateral: Secondary | ICD-10-CM | POA: Diagnosis not present

## 2023-04-10 DIAGNOSIS — H2181 Floppy iris syndrome: Secondary | ICD-10-CM | POA: Diagnosis not present

## 2023-04-10 DIAGNOSIS — Z961 Presence of intraocular lens: Secondary | ICD-10-CM | POA: Diagnosis not present

## 2023-04-21 ENCOUNTER — Emergency Department (HOSPITAL_COMMUNITY): Payer: Medicare HMO

## 2023-04-21 ENCOUNTER — Other Ambulatory Visit: Payer: Self-pay

## 2023-04-21 ENCOUNTER — Encounter (HOSPITAL_COMMUNITY): Payer: Self-pay | Admitting: Emergency Medicine

## 2023-04-21 ENCOUNTER — Inpatient Hospital Stay (HOSPITAL_COMMUNITY)
Admission: EM | Admit: 2023-04-21 | Discharge: 2023-04-24 | DRG: 389 | Disposition: A | Discharge status: Home / Self Care | Payer: Medicare HMO | Attending: Family Medicine | Admitting: Family Medicine

## 2023-04-21 ENCOUNTER — Inpatient Hospital Stay (HOSPITAL_COMMUNITY): Payer: Medicare HMO

## 2023-04-21 ENCOUNTER — Other Ambulatory Visit (HOSPITAL_COMMUNITY): Payer: Medicare HMO

## 2023-04-21 DIAGNOSIS — G8929 Other chronic pain: Secondary | ICD-10-CM | POA: Diagnosis not present

## 2023-04-21 DIAGNOSIS — R0902 Hypoxemia: Secondary | ICD-10-CM | POA: Diagnosis present

## 2023-04-21 DIAGNOSIS — J849 Interstitial pulmonary disease, unspecified: Secondary | ICD-10-CM | POA: Diagnosis not present

## 2023-04-21 DIAGNOSIS — K7689 Other specified diseases of liver: Secondary | ICD-10-CM | POA: Diagnosis not present

## 2023-04-21 DIAGNOSIS — R231 Pallor: Secondary | ICD-10-CM | POA: Diagnosis not present

## 2023-04-21 DIAGNOSIS — K56609 Unspecified intestinal obstruction, unspecified as to partial versus complete obstruction: Secondary | ICD-10-CM | POA: Diagnosis not present

## 2023-04-21 DIAGNOSIS — E875 Hyperkalemia: Secondary | ICD-10-CM | POA: Diagnosis present

## 2023-04-21 DIAGNOSIS — Z7901 Long term (current) use of anticoagulants: Secondary | ICD-10-CM

## 2023-04-21 DIAGNOSIS — Z4682 Encounter for fitting and adjustment of non-vascular catheter: Secondary | ICD-10-CM | POA: Diagnosis not present

## 2023-04-21 DIAGNOSIS — I10 Essential (primary) hypertension: Secondary | ICD-10-CM | POA: Diagnosis present

## 2023-04-21 DIAGNOSIS — I4891 Unspecified atrial fibrillation: Principal | ICD-10-CM

## 2023-04-21 DIAGNOSIS — R140 Abdominal distension (gaseous): Secondary | ICD-10-CM | POA: Diagnosis not present

## 2023-04-21 DIAGNOSIS — K5669 Other partial intestinal obstruction: Secondary | ICD-10-CM | POA: Diagnosis not present

## 2023-04-21 DIAGNOSIS — R109 Unspecified abdominal pain: Secondary | ICD-10-CM | POA: Diagnosis not present

## 2023-04-21 DIAGNOSIS — Z8501 Personal history of malignant neoplasm of esophagus: Secondary | ICD-10-CM | POA: Diagnosis not present

## 2023-04-21 DIAGNOSIS — K219 Gastro-esophageal reflux disease without esophagitis: Secondary | ICD-10-CM | POA: Diagnosis not present

## 2023-04-21 DIAGNOSIS — K449 Diaphragmatic hernia without obstruction or gangrene: Secondary | ICD-10-CM | POA: Diagnosis present

## 2023-04-21 DIAGNOSIS — N400 Enlarged prostate without lower urinary tract symptoms: Secondary | ICD-10-CM | POA: Diagnosis present

## 2023-04-21 DIAGNOSIS — E785 Hyperlipidemia, unspecified: Secondary | ICD-10-CM | POA: Diagnosis present

## 2023-04-21 DIAGNOSIS — Z981 Arthrodesis status: Secondary | ICD-10-CM

## 2023-04-21 DIAGNOSIS — Z825 Family history of asthma and other chronic lower respiratory diseases: Secondary | ICD-10-CM | POA: Diagnosis not present

## 2023-04-21 DIAGNOSIS — Z801 Family history of malignant neoplasm of trachea, bronchus and lung: Secondary | ICD-10-CM

## 2023-04-21 DIAGNOSIS — R1084 Generalized abdominal pain: Secondary | ICD-10-CM | POA: Diagnosis not present

## 2023-04-21 DIAGNOSIS — K8689 Other specified diseases of pancreas: Secondary | ICD-10-CM | POA: Diagnosis not present

## 2023-04-21 DIAGNOSIS — K5730 Diverticulosis of large intestine without perforation or abscess without bleeding: Secondary | ICD-10-CM | POA: Diagnosis not present

## 2023-04-21 LAB — BASIC METABOLIC PANEL
Anion gap: 10 (ref 5–15)
BUN: 17 mg/dL (ref 8–23)
CO2: 25 mmol/L (ref 22–32)
Calcium: 8.8 mg/dL — ABNORMAL LOW (ref 8.9–10.3)
Chloride: 99 mmol/L (ref 98–111)
Creatinine, Ser: 0.98 mg/dL (ref 0.61–1.24)
GFR, Estimated: >60 mL/min (ref >60)
Glucose, Bld: 129 mg/dL — ABNORMAL HIGH (ref 70–99)
Potassium: 5.3 mmol/L — ABNORMAL HIGH (ref 3.5–5.1)
Sodium: 134 mmol/L — ABNORMAL LOW (ref 135–145)

## 2023-04-21 LAB — CBC
HCT: 48.1 % (ref 39.0–52.0)
Hemoglobin: 16.7 g/dL (ref 13.0–17.0)
MCH: 33.5 pg (ref 26.0–34.0)
MCHC: 34.7 g/dL (ref 30.0–36.0)
MCV: 96.6 fL (ref 80.0–100.0)
Platelets: 177 10*3/uL (ref 150–400)
RBC: 4.98 MIL/uL (ref 4.22–5.81)
RDW: 12.2 % (ref 11.5–15.5)
WBC: 9 10*3/uL (ref 4.0–10.5)
nRBC: 0 % (ref 0.0–0.2)

## 2023-04-21 LAB — URINALYSIS, ROUTINE W REFLEX MICROSCOPIC
Bilirubin Urine: NEGATIVE
Glucose, UA: NEGATIVE mg/dL
Hgb urine dipstick: NEGATIVE
Ketones, ur: NEGATIVE mg/dL
Leukocytes,Ua: NEGATIVE
Nitrite: NEGATIVE
Protein, ur: NEGATIVE mg/dL
Specific Gravity, Urine: >1.046 — ABNORMAL HIGH (ref 1.005–1.030)
pH: 5 (ref 5.0–8.0)

## 2023-04-21 LAB — COMPREHENSIVE METABOLIC PANEL
ALT: 21 U/L (ref 0–44)
AST: 22 U/L (ref 15–41)
Albumin: 3.8 g/dL (ref 3.5–5.0)
Alkaline Phosphatase: 95 U/L (ref 38–126)
Anion gap: 11 (ref 5–15)
BUN: 15 mg/dL (ref 8–23)
CO2: 26 mmol/L (ref 22–32)
Calcium: 11.1 mg/dL — ABNORMAL HIGH (ref 8.9–10.3)
Chloride: 100 mmol/L (ref 98–111)
Creatinine, Ser: 0.98 mg/dL (ref 0.61–1.24)
GFR, Estimated: >60 mL/min (ref >60)
Glucose, Bld: 178 mg/dL — ABNORMAL HIGH (ref 70–99)
Potassium: 5.4 mmol/L — ABNORMAL HIGH (ref 3.5–5.1)
Sodium: 137 mmol/L (ref 135–145)
Total Bilirubin: 0.6 mg/dL (ref 0.3–1.2)
Total Protein: 7 g/dL (ref 6.5–8.1)

## 2023-04-21 LAB — LIPASE, BLOOD: Lipase: 25 U/L (ref 11–51)

## 2023-04-21 MED ORDER — IOHEXOL 350 MG/ML SOLN
75.0000 mL | Freq: Once | INTRAVENOUS | Status: AC | PRN
  Administered 2023-04-21: 75 mL via INTRAVENOUS

## 2023-04-21 MED ORDER — SODIUM CHLORIDE 0.9 % IV SOLN
12.5000 mg | Freq: Four times a day (QID) | INTRAVENOUS | Status: DC | PRN
  Administered 2023-04-21 – 2023-04-22 (×3): 12.5 mg via INTRAVENOUS
  Filled 2023-04-21: qty 12.5
  Filled 2023-04-21 (×2): qty 0.5

## 2023-04-21 MED ORDER — ACETAMINOPHEN 10 MG/ML IV SOLN
1000.0000 mg | Freq: Four times a day (QID) | INTRAVENOUS | Status: AC
  Administered 2023-04-21 – 2023-04-22 (×4): 1000 mg via INTRAVENOUS
  Filled 2023-04-21 (×4): qty 100

## 2023-04-21 MED ORDER — HYDROMORPHONE HCL 1 MG/ML IJ SOLN
1.0000 mg | INTRAMUSCULAR | Status: DC | PRN
  Administered 2023-04-21 (×2): 1 mg via INTRAVENOUS
  Filled 2023-04-21 (×2): qty 1

## 2023-04-21 MED ORDER — FUROSEMIDE 10 MG/ML IJ SOLN
20.0000 mg | Freq: Once | INTRAMUSCULAR | Status: AC
  Administered 2023-04-21: 20 mg via INTRAVENOUS
  Filled 2023-04-21: qty 2

## 2023-04-21 MED ORDER — MORPHINE SULFATE (PF) 4 MG/ML IV SOLN
4.0000 mg | Freq: Once | INTRAVENOUS | Status: AC
  Administered 2023-04-21: 4 mg via INTRAVENOUS
  Filled 2023-04-21: qty 1

## 2023-04-21 MED ORDER — ENOXAPARIN SODIUM 40 MG/0.4ML IJ SOSY
40.0000 mg | PREFILLED_SYRINGE | INTRAMUSCULAR | Status: DC
  Administered 2023-04-21 – 2023-04-23 (×3): 40 mg via SUBCUTANEOUS
  Filled 2023-04-21 (×3): qty 0.4

## 2023-04-21 MED ORDER — ONDANSETRON HCL 4 MG/2ML IJ SOLN
4.0000 mg | Freq: Once | INTRAMUSCULAR | Status: AC
  Administered 2023-04-21: 4 mg via INTRAVENOUS
  Filled 2023-04-21: qty 2

## 2023-04-21 MED ORDER — METOPROLOL TARTRATE 5 MG/5ML IV SOLN
2.5000 mg | Freq: Four times a day (QID) | INTRAVENOUS | Status: DC
  Administered 2023-04-21 – 2023-04-23 (×8): 2.5 mg via INTRAVENOUS
  Filled 2023-04-21 (×9): qty 5

## 2023-04-21 MED ORDER — ONDANSETRON 4 MG PO TBDP: 4.0000 mg | ORAL_TABLET | Freq: Three times a day (TID) | ORAL | Status: DC | PRN

## 2023-04-21 MED ORDER — PHENOL 1.4 % MT LIQD
1.0000 | OROMUCOSAL | Status: DC | PRN
  Administered 2023-04-21: 1 via OROMUCOSAL
  Filled 2023-04-21 (×2): qty 177

## 2023-04-21 MED ORDER — HYDROMORPHONE HCL 1 MG/ML IJ SOLN
1.0000 mg | Freq: Once | INTRAMUSCULAR | Status: AC
  Administered 2023-04-21: 1 mg via INTRAVENOUS
  Filled 2023-04-21: qty 1

## 2023-04-21 MED ORDER — SODIUM CHLORIDE 0.9 % IV SOLN: INTRAVENOUS | Status: AC

## 2023-04-21 MED ORDER — DIATRIZOATE MEGLUMINE & SODIUM 66-10 % PO SOLN
90.0000 mL | Freq: Once | ORAL | Status: AC
  Administered 2023-04-21: 90 mL via NASOGASTRIC
  Filled 2023-04-21 (×2): qty 90

## 2023-04-21 MED ORDER — PANTOPRAZOLE SODIUM 40 MG IV SOLR
40.0000 mg | Freq: Every day | INTRAVENOUS | Status: DC
  Administered 2023-04-21: 40 mg via INTRAVENOUS
  Filled 2023-04-21: qty 10

## 2023-04-21 NOTE — ED Triage Notes (Signed)
Patient from home via EMS for eval of central abdominal pain since last night. Endorses nausea without vomiting but that is becoming progressively worse and is dry heaving in triage. Last BM last night at 2000.

## 2023-04-21 NOTE — Consult Note (Signed)
Reason for Consult/Chief Complaint: SBO Consultant: Alfredo Martinez, PA  Thomas Dominguez is an 77 y.o. male.   HPI: 89M with n/v/abdominal pain that began 10/25 at 2200. Last BM 10/25 at 2000. Last colonoscopy 2019. H/o multiple SBO, most recent July 2022. Multiple abdominal surgeries.   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/17/2019   Displacement of lumbar intervertebral disc without myelopathy 04/22/2010   Diverticulosis 01/17/2019   Dysrhythmia    Dysrhythmias 12/19/2011   Esophageal cancer (HCC)    GERD (gastroesophageal reflux disease)    H/O small bowel obstruction 01/17/2019   Hiatal hernia 01/17/2019   History of esophagectomy [facility] at [facility] for severe Barrett's with microfocus of cancer 11/22/2019   Hyperlipidemia 2020   Hypertension    ILD (interstitial lung disease) (HCC)    Inguinal hernia    bilateral, left more prominant   Left inguinal hernia 03/21/2021   Leg swelling 01/17/2019   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HCC)    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   Pneumonia 11/2019   Pneumothorax 04/08/2020   SBO (small bowel obstruction) (HCC) 11/20/2019   Status post left inguinal hernia repair 04/15/2021   Wears glasses     Past Surgical History:  Procedure Laterality Date   ABDOMINAL HERNIA REPAIR  2012, 2014, 2017   ventral   BRONCHIAL BIOPSY  04/08/2020   Procedure: BRONCHIAL BIOPSIES;  Surgeon: Chilton Greathouse, MD;  Location: [facility];  Service: Cardiopulmonary;;   BRONCHIAL BRUSHINGS  04/08/2020   Procedure: BRONCHIAL BRUSHINGS;  Surgeon: Chilton Greathouse, MD;  Location: [facility];  Service: Cardiopulmonary;;   BRONCHIAL WASHINGS  04/08/2020    Procedure: BRONCHIAL WASHINGS;  Surgeon: Chilton Greathouse, MD;  Location: [facility];  Service: Cardiopulmonary;;   COLONOSCOPY  2019   ESOPHAGECTOMY  1997   for esophageal cancer   ESOPHAGOGASTRODUODENOSCOPY (EGD) WITH PROPOFOL N/A 06/11/2020   Procedure: ESOPHAGOGASTRODUODENOSCOPY (EGD) WITH PROPOFOL;  Surgeon: Napoleon Form, MD;  Location: [facility];  Service: Endoscopy;  Laterality: N/A;   EYE SURGERY  2\27\2024   Cataracts removed   HERNIA REPAIR  Ventral,incisional 5x & inquinal       [id number]   Last 03/22/2021   INGUINAL HERNIA REPAIR Left 03/22/2021   Procedure: OPEN REPAIR LEFT INGUINAL HERNIA WITH MESH;  Surgeon: Darnell Level, MD;  Location: [facility];  Service: General;  Laterality: Left;   PARATHYROIDECTOMY Right 01/26/2020   Procedure: RIGHT INFERIOR PARATHYROIDECTOMY;  Surgeon: Darnell Level, MD;  Location: [facility];  Service: General;  Laterality: Right;   SMALL INTESTINE SURGERY  01/06/2012   SPINAL FUSION  2018, 2020   L2 L5 in 2018, L5-S1 2020   VIDEO BRONCHOSCOPY N/A 04/08/2020   Procedure: VIDEO BRONCHOSCOPY WITH FLUORO;  Surgeon: Chilton Greathouse, MD;  Location: [facility];  Service: Cardiopulmonary;  Laterality: N/A;    Family History  Problem Relation Age of Onset   GER disease Mother    Cancer Mother    Emphysema Father        smoker   Lung cancer Sister        never smoked   GER disease Daughter    GER disease Son    Colon cancer Neg Hx    Stomach cancer Neg Hx  Esophageal cancer Neg Hx     Social History:  reports that he has never smoked. He has never used smokeless tobacco. He reports that he does not currently use alcohol. He reports that he does not use drugs.  Allergies:  Allergies  Allergen Reactions   Mobic [Meloxicam] Shortness Of Breath    muscle pain   Statins Other (See Comments)    Muscle pain    Medications: I have reviewed the patient's current medications.  Results for orders placed or performed during the hospital encounter  of 04/21/23 (from the past 48 hour(s))  Lipase, blood     Status: None   Collection Time: 04/21/23  8:16 AM  Result Value Ref Range   Lipase 25 11 - 51 U/L    Comment: Performed at [facility], [address], [place], [place] [address]  Comprehensive metabolic panel     Status: Abnormal   Collection Time: 04/21/23  8:16 AM  Result Value Ref Range   Sodium 137 135 - 145 mmol/L   Potassium 5.4 (H) 3.5 - 5.1 mmol/L   Chloride 100 98 - 111 mmol/L   CO2 26 22 - 32 mmol/L   Glucose, Bld 178 (H) 70 - 99 mg/dL    Comment: Glucose reference range applies only to samples taken after fasting for at least 8 hours.   BUN 15 8 - 23 mg/dL   Creatinine, Ser 6.04 0.61 - 1.24 mg/dL   Calcium 54.0 (H) 8.9 - 10.3 mg/dL   Total Protein 7.0 6.5 - 8.1 g/dL   Albumin 3.8 3.5 - 5.0 g/dL   AST 22 15 - 41 U/L   ALT 21 0 - 44 U/L   Alkaline Phosphatase 95 38 - 126 U/L   Total Bilirubin 0.6 0.3 - 1.2 mg/dL   GFR, Estimated >98 >11 mL/min    Comment: (NOTE) Calculated using the CKD-EPI Creatinine Equation (2021)    Anion gap 11 5 - 15    Comment: Performed at [facility], [address], [place], [place] [address]  CBC     Status: None   Collection Time: 04/21/23  8:16 AM  Result Value Ref Range   WBC 9.0 4.0 - 10.5 K/uL   RBC 4.98 4.22 - 5.81 MIL/uL   Hemoglobin 16.7 13.0 - 17.0 g/dL   HCT 29.5 62.1 - 30.8 %   MCV 96.6 80.0 - 100.0 fL   MCH 33.5 26.0 - 34.0 pg   MCHC 34.7 30.0 - 36.0 g/dL   RDW 65.7 84.6 - 96.2 %   Platelets 177 150 - 400 K/uL   nRBC 0.0 0.0 - 0.2 %    Comment: Performed at [facility], [address] [place], [place] [address]    CT ABDOMEN PELVIS W CONTRAST  Result Date: 04/21/2023 CLINICAL DATA:  Acute abdominal pain, history of prior bowel obstructions. EXAM: CT ABDOMEN AND PELVIS WITH CONTRAST TECHNIQUE: Multidetector CT imaging of the abdomen and pelvis was performed using the standard protocol following bolus administration of intravenous contrast.  RADIATION DOSE REDUCTION: This exam was performed according to the departmental dose-optimization program which includes automated exposure control, adjustment of the mA and/or kV according to patient size and/or use of iterative reconstruction technique. CONTRAST:  75mL OMNIPAQUE IOHEXOL 350 MG/ML SOLN COMPARISON:  10/19/2021 FINDINGS: Lower chest: A very large hiatal hernia contains almost the entire stomach, part of the transverse colon, much of the pancreas, and part of the splenic vein. The entirety of  the hernia extends well up into the chest and beyond the superior margin of today's abdomen CT. This has been shown on prior exams. Bandlike regions of infrahilar atelectasis are present in the left upper lobe, right middle lobe, and both lower lobes. There is also passive atelectasis in both lower lobes associated with the large hiatal hernia. Hepatobiliary: Stable hepatic morphology and appearance including a 2.4 cm simple cyst of the right hepatic lobe on image 33 series 4. No further imaging workup of this lesion is indicated. Gallbladder unremarkable. Pancreas: Fatty atrophy. Much of the pancreas is intrathoracic due to the hernia. Overall stable. Spleen: Unremarkable Adrenals/Urinary Tract: Mildly prominent median lobe of the prostate gland indents the bladder base. Otherwise unremarkable. Stomach/Bowel: As noted above, there is a large hiatal hernia containing most of the stomach and part of the transverse colon. Mildly dilated loops proximal small bowel measuring up to 4.2 cm in diameter, extending to a transition point in the left abdomen about on image 55 of series 4 and image 169 of series 8 where there is mild angulation of a loop of bowel. Adhesion not excluded. Other postoperative findings in the small bowel noted, not in proximity to the transition. Normal appendix.  Descending and sigmoid colon diverticulosis. Vascular/Lymphatic: Unremarkable Reproductive: Mild prostatomegaly.  This ectomy clips  noted. Other: No supplemental non-categorized findings. Musculoskeletal: Posterolateral rod and pedicle screw fixation at L2-L3-L4, and also L5-S1 bilaterally. Interbody fusion at the involved levels and also at L4-5 with posterior decompression at the L5 level. No abnormal lucency along the screw tracks. IMPRESSION: 1. Mildly dilated loops of proximal small bowel measuring up to 4.2 cm in diameter, extending to a transition point in the left abdomen where there is mild angulation of a loop of bowel. Adhesion with mild/early small bowel obstruction is a possibility. 2. Very large hiatal hernia containing most of the stomach, part of the transverse colon, much of the pancreas, and part of the splenic vein. The entirety of the hernia extends well up into the chest and beyond the superior margin of today's abdomen CT. This has been shown on prior exams. 3. Descending and sigmoid colon diverticulosis. 4. Mild prostatomegaly. 5. Posterolateral rod and pedicle screw fixation at L2-L3-L4, and also L5-S1 bilaterally. Interbody fusion at the involved levels and also at L4-5 with posterior decompression at the L5 level. No abnormal lucency along the screw tracks. Electronically Signed   By: Gaylyn Rong M.D.   On: 04/21/2023 11:57    ROS 10 point review of systems is negative except as listed above in HPI.   Physical Exam Blood pressure 113/76, pulse 96, temperature 97.8 F (36.6 C), temperature source Oral, resp. rate 10, height 6' (1.829 m), weight 94.3 kg, SpO2 (!) 89%. Constitutional: well-developed, well-nourished HEENT: pupils equal, round, reactive to light, 2mm b/l, moist conjunctiva, external inspection of ears and nose normal, hearing intact Oropharynx: normal oropharyngeal mucosa, normal dentition Neck: no thyromegaly, trachea midline, no midline cervical tenderness to palpation Chest: breath sounds equal bilaterally, normal respiratory effort, no midline or lateral chest wall tenderness to  palpation/deformity Abdomen: soft, distended, NT, large midline scar, no bruising, no hepatosplenomegaly GU: no blood at urethral meatus of penis, no scrotal masses or abnormality  Back: no wounds, no thoracic/lumbar spine tenderness to palpation, no thoracic/lumbar spine stepoffs Rectal: deferred Extremities: 2+ radial and pedal pulses bilaterally, intact motor and sensation bilateral UE and LE, no peripheral edema MSK: normal gait/station, no clubbing/cyanosis of fingers/toes, normal ROM of all four extremities  Skin: warm, dry, no rashes Psych: normal memory, normal mood/affect     Assessment/Plan: SBO - SBO protocol, 1.4L out of NG on placement Large hiatal hernia - h/o prior robotic repair at [facility] in 2013, recommend o/p f/u FEN - strict NPO and NPO except sips/chips after NGT in place DVT - SCDs, LMWH Dispo -  admit to IMS     Diamantina Monks, MD General and Trauma Surgery [facility]

## 2023-04-21 NOTE — ED Notes (Signed)
Patient transported to CT 

## 2023-04-21 NOTE — Plan of Care (Signed)

## 2023-04-21 NOTE — Progress Notes (Signed)
FMTS Interim Progress Note  S: Patient assessed at bedside, sitting up watching TV.  NG tube off suction this patient took oral contrast about 2 hours ago.  Abdominal plain improved after additional removal of 1.4 L from NG tube.  Denies any nausea or vomiting currently.  O: BP 124/83 (BP Location: Left Arm)   Pulse 91   Temp 98.2 F (36.8 C) (Oral)   Resp 16   Ht (!) 6" (0.152 m)   Wt 95.5 kg   SpO2 99%   BMI 4111.82 kg/m    General: Well-appearing. Alert. NAD CV: RRR without murmur Pulm: CTAB. Normal WOB on 1 L nasal cannula. No wheezing Abdomen: Soft, distended.  Mild diffuse tenderness.  Absent bowel sounds Ext: Well perfused. Cap refill < 3 seconds Skin: Warm, dry. No rashes noted   A/P: SBO Symptomatically improved after placement of NG tube.  Will continue to monitor pain and signs of N/V. -Surgery consulted, appreciate recommendations regarding NG tube management -Pending small bowel follow-through imaging for 8-hour delay  Hypoxia Patient on 1 L nasal cannula during exam, breathing comfortably and is not on home oxygen.  Unlikely to need supplemental oxygen. -Advised nursing to remove nasal cannula if oxygen saturation is appropriate  Hyperkalemia Repeat BMP with K 5.3. -Lasix 20 mg IV x 1 -Follow-up CMP in AM  Remainder of plan per day team  Elberta Fortis, MD 04/21/2023, 10:28 PM PGY-2, [facility] pager [phone]

## 2023-04-21 NOTE — ED Notes (Signed)
ED TO INPATIENT HANDOFF REPORT  ED Nurse Name and Phone #: Vernona Rieger [phone]  S Name/Age/Gender Thomas Dominguez 77 y.o. male Room/Bed: OTFC/OTF  Code Status   Code Status: Full Code  Home/SNF/Other Home Patient oriented to: self, place, time, and situation Is this baseline? Yes   Triage Complete: Triage complete  Chief Complaint SBO (small bowel obstruction) (HCC) [K56.609]  Triage Note Patient from home via EMS for eval of central abdominal pain since last night. Endorses nausea without vomiting but that is becoming progressively worse and is dry heaving in triage. Last BM last night at 2000.    Allergies Allergies  Allergen Reactions   Mobic [Meloxicam] Shortness Of Breath    muscle pain   Statins Other (See Comments)    Muscle pain    Level of Care/Admitting Diagnosis ED Disposition     ED Disposition  Admit   Condition  --   Comment  Hospital Area: [facility] [[id number]]  Level of Care: Med-Surg [16]  May admit patient to [facility] or [facility] if equivalent level of care is available:: No  Covid Evaluation: Asymptomatic - no recent exposure (last 10 days) testing not required  Diagnosis: SBO (small bowel obstruction) [facility]) [[id number]]  Admitting Physician: Gerrit Heck [[id number]]  Attending Physician: Billey Co [[id number]]  Certification:: I certify this patient will need inpatient services for at least 2 midnights  Expected Medical Readiness: 04/24/2023          B Medical/Surgery History 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/17/2019   Displacement of lumbar intervertebral disc without myelopathy 04/22/2010    Diverticulosis 01/17/2019   Dysrhythmia    Dysrhythmias 12/19/2011   Esophageal cancer (HCC)    GERD (gastroesophageal reflux disease)    H/O small bowel obstruction 01/17/2019   Hiatal hernia 01/17/2019   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 11/22/2019   Hyperlipidemia 2020   Hypertension    ILD (interstitial lung disease) (HCC)    Inguinal hernia    bilateral, left more prominant   Left inguinal hernia 03/21/2021   Leg swelling 01/17/2019   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HCC)    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   Pneumonia 11/2019   Pneumothorax 04/08/2020   SBO (small bowel obstruction) (HCC) 11/20/2019   Status post left inguinal hernia repair 04/15/2021   Wears glasses    Past Surgical History:  Procedure Laterality Date   ABDOMINAL HERNIA REPAIR  2012, 2014, 2017   ventral   BRONCHIAL BIOPSY  04/08/2020   Procedure: BRONCHIAL BIOPSIES;  Surgeon: Chilton Greathouse, MD;  Location: [facility];  Service: Cardiopulmonary;;   BRONCHIAL BRUSHINGS  04/08/2020   Procedure: BRONCHIAL BRUSHINGS;  Surgeon: Chilton Greathouse, MD;  Location: [facility];  Service: Cardiopulmonary;;   BRONCHIAL WASHINGS  04/08/2020   Procedure: BRONCHIAL WASHINGS;  Surgeon: Chilton Greathouse, MD;  Location: [facility];  Service: Cardiopulmonary;;   COLONOSCOPY  2019   ESOPHAGECTOMY  1997   for esophageal cancer   ESOPHAGOGASTRODUODENOSCOPY (EGD) WITH PROPOFOL N/A 06/11/2020   Procedure: ESOPHAGOGASTRODUODENOSCOPY (EGD) WITH PROPOFOL;  Surgeon: Napoleon Form, MD;  Location: [facility];  Service: Endoscopy;  Laterality: N/A;   EYE SURGERY  [phone]   Cataracts removed   HERNIA REPAIR  Ventral,incisional 5x & inquinal       [id number]   Last 03/22/2021   INGUINAL HERNIA REPAIR Left 03/22/2021   Procedure: OPEN REPAIR LEFT INGUINAL HERNIA WITH MESH;  Surgeon: Darnell Level, MD;  Location: [facility];  Service: General;  Laterality: Left;   PARATHYROIDECTOMY Right  01/26/2020   Procedure: RIGHT INFERIOR PARATHYROIDECTOMY;  Surgeon: Darnell Level, MD;  Location: [facility];  Service: General;  Laterality: Right;   SMALL INTESTINE SURGERY  01/06/2012   SPINAL FUSION  2018, 2020   L2 L5 in 2018, L5-S1 2020   VIDEO BRONCHOSCOPY N/A 04/08/2020   Procedure: VIDEO BRONCHOSCOPY WITH FLUORO;  Surgeon: Chilton Greathouse, MD;  Location: [facility];  Service: Cardiopulmonary;  Laterality: N/A;     A IV Location/Drains/Wounds Patient Lines/Drains/Airways Status     Active Line/Drains/Airways     Name Placement date Placement time Site Days   Peripheral IV 04/21/23 20 G Left Antecubital 04/21/23  0815  Antecubital  less than 1   Peripheral IV 04/21/23 22 G Right Antecubital 04/21/23  --  Antecubital  less than 1   NG/OG Vented/Dual Lumen 16 Fr. Right nare External length of tube 56 cm 04/21/23  1310  Right nare  less than 1            Intake/Output Last 24 hours  Intake/Output Summary (Last 24 hours) at 04/21/2023 1544 Last data filed at 04/21/2023 1402 Gross per 24 hour  Intake --  Output 1250 ml  Net -1250 ml    Labs/Imaging Results for orders placed or performed during the hospital encounter of 04/21/23 (from the past 48 hour(s))  Lipase, blood     Status: None   Collection Time: 04/21/23  8:16 AM  Result Value Ref Range   Lipase 25 11 - 51 U/L    Comment: Performed at [facility], [address], [place], [place] [address]  Comprehensive metabolic panel     Status: Abnormal   Collection Time: 04/21/23  8:16 AM  Result Value Ref Range   Sodium 137 135 - 145 mmol/L   Potassium 5.4 (H) 3.5 - 5.1 mmol/L   Chloride 100 98 - 111 mmol/L   CO2 26 22 - 32 mmol/L   Glucose, Bld 178 (H) 70 - 99 mg/dL    Comment: Glucose reference range applies only to samples taken after fasting for at least 8 hours.   BUN 15 8 - 23 mg/dL   Creatinine, Ser 8.11 0.61 - 1.24 mg/dL   Calcium 91.4 (H) 8.9 - 10.3 mg/dL   Total Protein 7.0 6.5 - 8.1 g/dL    Albumin 3.8 3.5 - 5.0 g/dL   AST 22 15 - 41 U/L   ALT 21 0 - 44 U/L   Alkaline Phosphatase 95 38 - 126 U/L   Total Bilirubin 0.6 0.3 - 1.2 mg/dL   GFR, Estimated >78 >29 mL/min    Comment: (NOTE) Calculated using the CKD-EPI Creatinine Equation (2021)    Anion gap 11 5 - 15    Comment: Performed at [facility], [address], [place], [place] [address]  CBC     Status: None   Collection Time: 04/21/23  8:16 AM  Result Value Ref Range   WBC 9.0 4.0 - 10.5 K/uL   RBC 4.98 4.22 - 5.81 MIL/uL   Hemoglobin 16.7 13.0 - 17.0 g/dL   HCT 08.6 57.8 - 46.9 %   MCV 96.6  80.0 - 100.0 fL   MCH 33.5 26.0 - 34.0 pg   MCHC 34.7 30.0 - 36.0 g/dL   RDW 91.4 78.2 - 95.6 %   Platelets 177 150 - 400 K/uL   nRBC 0.0 0.0 - 0.2 %    Comment: Performed at [facility], [address], [place], [place] [address]   DG Abd Portable 1V-Small Bowel Protocol-Position Verification  Result Date: 04/21/2023 CLINICAL DATA:  Central abdominal pain since yesterday, enteric catheter placement EXAM: PORTABLE ABDOMEN - 1 VIEW COMPARISON:  04/21/2023 FINDINGS: Frontal view of the lower chest and upper abdomen demonstrates enteric catheter tip and side port overlying the medial right lung base, likely within the large hiatal hernia seen on preceding CT. Cardiac silhouette is enlarged. Scattered hypoventilatory changes at the lung bases. Paucity of bowel gas. IMPRESSION: 1. Enteric catheter tip and side port likely within the the large hiatal hernia within the right hemithorax, as demonstrated on preceding CT exam. Electronically Signed   By: Sharlet Salina M.D.   On: 04/21/2023 15:17   CT ABDOMEN PELVIS W CONTRAST  Result Date: 04/21/2023 CLINICAL DATA:  Acute abdominal pain, history of prior bowel obstructions. EXAM: CT ABDOMEN AND PELVIS WITH CONTRAST TECHNIQUE: Multidetector CT imaging of the abdomen and pelvis was performed using the standard protocol following bolus administration of intravenous contrast.  RADIATION DOSE REDUCTION: This exam was performed according to the departmental dose-optimization program which includes automated exposure control, adjustment of the mA and/or kV according to patient size and/or use of iterative reconstruction technique. CONTRAST:  75mL OMNIPAQUE IOHEXOL 350 MG/ML SOLN COMPARISON:  10/19/2021 FINDINGS: Lower chest: A very large hiatal hernia contains almost the entire stomach, part of the transverse colon, much of the pancreas, and part of the splenic vein. The entirety of the hernia extends well up into the chest and beyond the superior margin of today's abdomen CT. This has been shown on prior exams. Bandlike regions of infrahilar atelectasis are present in the left upper lobe, right middle lobe, and both lower lobes. There is also passive atelectasis in both lower lobes associated with the large hiatal hernia. Hepatobiliary: Stable hepatic morphology and appearance including a 2.4 cm simple cyst of the right hepatic lobe on image 33 series 4. No further imaging workup of this lesion is indicated. Gallbladder unremarkable. Pancreas: Fatty atrophy. Much of the pancreas is intrathoracic due to the hernia. Overall stable. Spleen: Unremarkable Adrenals/Urinary Tract: Mildly prominent median lobe of the prostate gland indents the bladder base. Otherwise unremarkable. Stomach/Bowel: As noted above, there is a large hiatal hernia containing most of the stomach and part of the transverse colon. Mildly dilated loops proximal small bowel measuring up to 4.2 cm in diameter, extending to a transition point in the left abdomen about on image 55 of series 4 and image 169 of series 8 where there is mild angulation of a loop of bowel. Adhesion not excluded. Other postoperative findings in the small bowel noted, not in proximity to the transition. Normal appendix.  Descending and sigmoid colon diverticulosis. Vascular/Lymphatic: Unremarkable Reproductive: Mild prostatomegaly.  This ectomy clips  noted. Other: No supplemental non-categorized findings. Musculoskeletal: Posterolateral rod and pedicle screw fixation at L2-L3-L4, and also L5-S1 bilaterally. Interbody fusion at the involved levels and also at L4-5 with posterior decompression at the L5 level. No abnormal lucency along the screw tracks. IMPRESSION: 1. Mildly dilated loops of proximal small bowel measuring up to 4.2 cm in diameter, extending to a transition point in the left  abdomen where there is mild angulation of a loop of bowel. Adhesion with mild/early small bowel obstruction is a possibility. 2. Very large hiatal hernia containing most of the stomach, part of the transverse colon, much of the pancreas, and part of the splenic vein. The entirety of the hernia extends well up into the chest and beyond the superior margin of today's abdomen CT. This has been shown on prior exams. 3. Descending and sigmoid colon diverticulosis. 4. Mild prostatomegaly. 5. Posterolateral rod and pedicle screw fixation at L2-L3-L4, and also L5-S1 bilaterally. Interbody fusion at the involved levels and also at L4-5 with posterior decompression at the L5 level. No abnormal lucency along the screw tracks. Electronically Signed   By: Gaylyn Rong M.D.   On: 04/21/2023 11:57    Pending Labs Unresulted Labs (From admission, onward)     Start     Ordered   04/22/23 0500  Comprehensive metabolic panel  Tomorrow morning,   R        04/21/23 1448   04/21/23 0800  Urinalysis, Routine w reflex microscopic -Urine, Clean Catch  Once,   URGENT       Question:  Specimen Source  Answer:  Urine, Clean Catch   04/21/23 0804            Vitals/Pain Today's Vitals   04/21/23 0920 04/21/23 1200 04/21/23 1234 04/21/23 1400  BP:  113/76  132/73  Pulse:  96  76  Resp:  10  (!) 8  Temp:   97.8 F (36.6 C)   TempSrc:   Oral   SpO2:  (!) 89%  99%  Weight:      Height:      PainSc: 5        Isolation Precautions No active  isolations  Medications Medications  promethazine (PHENERGAN) 12.5 mg in sodium chloride 0.9 % 50 mL IVPB (0 mg Intravenous Stopped 04/21/23 1126)  ondansetron (ZOFRAN) injection 4 mg (has no administration in time range)  diatrizoate meglumine-sodium (GASTROGRAFIN) 66-10 % solution 90 mL (has no administration in time range)  enoxaparin (LOVENOX) injection 40 mg (has no administration in time range)  pantoprazole (PROTONIX) injection 40 mg (has no administration in time range)  ondansetron (ZOFRAN) injection 4 mg (4 mg Intravenous Given 04/21/23 0820)  morphine (PF) 4 MG/ML injection 4 mg (4 mg Intravenous Given 04/21/23 0820)  HYDROmorphone (DILAUDID) injection 1 mg (1 mg Intravenous Given 04/21/23 1101)  iohexol (OMNIPAQUE) 350 MG/ML injection 75 mL (75 mLs Intravenous Contrast Given 04/21/23 1108)    Mobility walks     Focused Assessments Abd pain   R Recommendations: See Admitting Provider Note  Report given to:   Additional Notes: NG tube in place

## 2023-04-21 NOTE — H&P (Addendum)
Hospital Admission History and Physical Service Pager: [phone]  Patient name: Thomas Dominguez. Annett Medical record number: [id number] Date of Birth: [birth year] Age: 77 y.o. Gender: male  Primary Care Provider: Shelby Mattocks, DO Consultants: GI, Gen Surg Code Status: Full Preferred Emergency Contact:  Contact Information     Name Relation Home Work Mobile   Meckes,Barbara Spouse [phone]  [phone]   Spyridon, Kenyon [phone]  [phone]      Other Contacts   None on File      Chief Complaint: Abdominal pain  Assessment and Plan: Thomas Dominguez is a 77 y.o. male presenting with several hours of acute abdominal pain, nausea, vomiting, and lack of flatulence and normal bowel movements. He has an extensive history of GI surgeries and pathology, including multiple previous SBOs. Differential for this patient's presentation of this includes: Small bowel obstruction Patient has a history of small bowel obstructions as well as extensive history of intra-abdominal surgeries making small bowel obstruction very likely. Thomas Dominguez well to decompression via nasogastric tube Patient has large hiatal hernia containing much of his abdominal organs, very susceptible to mechanical obstruction. Not observed on CT Volvulus Similar presentation to SBO No radiographic evidence observed Incarcerated hernia Also present with acute abdominal pain and loss of normal bowel function Would be apparent on CT Patient's hernia is very large caliber making incarceration less likely Assessment & Plan Small bowel obstruction (HCC) Given this patient's history and presentation this is the most likely cause of his nausea and vomiting.  Surgery has already been consulted and is following this patient.  They have placed a nasogastric tube for decompression. -Admit to FM TS, Dr. Miquel Dunn attending -Continue nasogastric decompression of affected SBO -Surgery is following appreciate their  recommendations -N.p.o. -Convert appropriate home meds to IV formulations -AM CMP  Chronic and Stable Conditions: A-fib: anticoagulation with lovenox, IV metoprolol for now and can transition to PO as able, will hold propafenone for now (held last admission for same) BPH: Hold proscar and flomax while NPO GERD: Hold protonix and pepcid while NPO  FEN/GI: NPO VTE Prophylaxis: lovenox  Disposition: Med-surg  History of Present Illness:  Thomas Dominguez is a 77 y.o. male presenting with acute abdominal pain, nausea and vomiting.   Last had bowel movement at 8 PM that was more fluid than normal. Had worsening abdominal pain/cramps overnight though where he could not sleep well. He had a fairly rapid increase in his pain overnight which prompted his arrival to the ED. He notes this has happened before.  He has had nausea and vomiting with this. No blood in stool or vomit, but vomit looked more yellow. No urinary symptoms. No chest pain. Has SOB like at baseline but not worse. No changes in diet that he knows of. No fevers, but he felt hot, and he did have "severe" sweating before he came in this morning and while he has been in the ED. He has had this before around 6 times.  In the ED, routine labs were drawn with no major abnormalities noted.  CT abdomen pelvis showed mildly dilated loops of small bowel as well as his previously known large hiatal hernia.  Surgery and GI were consulted, and nasogastric decompression of suspected small bowel obstruction was started.  Review Of Systems: Per HPI.  Pertinent Past Medical History: BPH GERD HLD Hiatal hernia s/p surgery to help with reflux Atrial fibrillation Remainder reviewed in history tab.   Pertinent Past Surgical History: Hiatal hernia repair surgery -  2012, 2014, 2017 Spinal fusion Inguinal hernia repair Parathyroidectomy  Remainder reviewed in history tab.   Pertinent Social History: Tobacco use: Yes/No/Former Alcohol use:  infrequently, maybe once a year Other Substance use: None Lives with   Pertinent Family History: Mother: GERD, cancer Father: Lung cancer Daughter and son: GERD Remainder reviewed in history tab.   Important Outpatient Medications: Eliquis 5 mg BID Prilosec 40 mg BID Zofran Metoprolol Propafenone Tramadol Effexor Finasteride Tamsulosin Remainder reviewed in medication history.   Objective: BP 132/73   Pulse 76   Temp 97.8 F (36.6 C) (Oral)   Resp (!) 8   Ht 6' (1.829 m)   Wt 94.3 kg   SpO2 99%   BMI 28.21 kg/m  Exam: General: Alert, oriented.  No obvious distress Eyes: PERRLA, EOMI. ENTM: Moist mucous membranes Cardiovascular: RRR, no M/R/G Respiratory: CTAB, no increased work of breathing Gastrointestinal: Distended, soft, minimally tender.  Decreased bowel sounds in the lower quadrants, minimal bowel sounds present in the left upper quadrant near the introitus of patient's hernia MSK: Appropriate bulk and tone Derm: Warm well-perfused  Labs:  CBC BMET  Recent Labs  Lab 04/21/23 0816  WBC 9.0  HGB 16.7  HCT 48.1  PLT 177   Recent Labs  Lab 04/21/23 0816  NA 137  K 5.4*  CL 100  CO2 26  BUN 15  CREATININE 0.98  GLUCOSE 178*  CALCIUM 11.1*     EKG: Normal sinus rhythm, no evidence of T elevation or left bundle branch block.   Imaging Studies Performed:  CT abdomen pelvis with contrast  IMPRESSION: 1. Mildly dilated loops of proximal small bowel measuring up to 4.2 cm in diameter, extending to a transition point in the left abdomen where there is mild angulation of a loop of bowel. Adhesion with mild/early small bowel obstruction is a possibility. 2. Very large hiatal hernia containing most of the stomach, part of the transverse colon, much of the pancreas, and part of the splenic vein. The entirety of the hernia extends well up into the chest and beyond the superior margin of today's abdomen CT. This has been shown on prior exams. 3.  Descending and sigmoid colon diverticulosis. 4. Mild prostatomegaly. 5. Posterolateral rod and pedicle screw fixation at L2-L3-L4, and also L5-S1 bilaterally. Interbody fusion at the involved levels and also at L4-5 with posterior decompression at the L5 level. No abnormal lucency along the screw tracks.  Gerrit Heck, DO 04/21/2023, 3:07 PM PGY-1, [facility]  FPTS Intern pager: [phone], text pages welcome Secure chat group [facility] Teaching Service   I agree with the assessment and plan as documented in the above note. Janeal Holmes, MD PGY-2, [facility]

## 2023-04-21 NOTE — ED Provider Notes (Signed)
[place] EMERGENCY DEPARTMENT AT [facility] Provider Note   CSN: [id number] Arrival date & time: 04/21/23  0725     History  Chief Complaint  Patient presents with   Abdominal Pain    Thomas Dominguez is a 77 y.o. male with past medical history significant for chronic pain, gastroparesis, history of esophageal cancer status post esophageal resection, multiple small bowel obstructions, multiple hernia repair who presents with concern for central abdominal pain since last night.  He endorses nausea without vomiting that is becoming progressively worse, dry heaving noted in triage.  He reports last bowel movement was last night around 8 PM.  He denies any passage of gas this morning.  He reports it feels just like his previous bowel obstructions.  He reports that he is no longer having problems with gastroparesis after a procedure on his pyloric valve at [facility] in August of this year.  Patient did not take anything for pain or nausea prior to arrival.  He has some chronic back pain and reports he takes tramadol around 4 times a week.  HPI     Home Medications Prior to Admission medications   Medication Sig Start Date End Date Taking? Authorizing Provider  apixaban (ELIQUIS) 5 MG TABS tablet Take 5 mg by mouth 2 (two) times daily.    [provider]  famotidine (PEPCID) 20 MG tablet TAKE 1 TABLET AT BEDTIME 07/27/22   Nandigam, Eleonore Chiquito, MD  finasteride (PROSCAR) 5 MG tablet TAKE 1 TABLET EVERY DAY, PLEASE MAKE ANNUAL APPOINTMENT BEFORE NEXT REFILL 11/29/22   Shelby Mattocks, DO  hyoscyamine (LEVSIN SL) 0.125 MG SL tablet DISSOLVE 1 TABLET UNDER THE TONGUE TWICE DAILY (NEED APPOINTMENT) AS NEEDED 02/15/23   Napoleon Form, MD  metoprolol succinate (TOPROL-XL) 25 MG 24 hr tablet TAKE 1 TABLET EVERY DAY (STOP LOPRESSOR) 01/11/23   Little Ishikawa, MD  Multiple Vitamins-Minerals (CENTRUM SILVER 50+MEN) TABS Take 1 tablet by mouth daily.    [provider]   omeprazole (PRILOSEC) 40 MG capsule TAKE 1 CAPSULE TWICE DAILY 10/09/22   Nandigam, Eleonore Chiquito, MD  ondansetron (ZOFRAN) 4 MG tablet TAKE 1 TABLET EVERY EIGHT HOURS AS NEEDED FOR NAUSEA OR VOMITING. 02/28/23   Napoleon Form, MD  propafenone (RYTHMOL) 225 MG tablet TAKE 1 TABLET EVERY 8 HOURS 10/23/22   Little Ishikawa, MD  tamsulosin (FLOMAX) 0.4 MG CAPS capsule TAKE 2 CAPSULES EVERY DAY 12/15/22   Shelby Mattocks, DO  traMADol (ULTRAM) 50 MG tablet Take 1 tablet (50 mg total) by mouth 2 (two) times daily as needed for moderate pain. Needs appointment for further refills. 02/15/23   Shelby Mattocks, DO  venlafaxine XR (EFFEXOR-XR) 150 MG 24 hr capsule TAKE 1 CAPSULE EVERY DAY WITH BREAKFAST 10/30/22   Shelby Mattocks, DO      Allergies    Mobic [meloxicam] and Statins    Review of Systems   Review of Systems  All other systems reviewed and are negative.   Physical Exam Updated Vital Signs BP 132/73   Pulse 76   Temp 97.8 F (36.6 C) (Oral)   Resp (!) 8   Ht 6' (1.829 m)   Wt 94.3 kg   SpO2 99%   BMI 28.21 kg/m  Physical Exam Vitals and nursing note reviewed.  Constitutional:      General: He is not in acute distress.    Appearance: Normal appearance.  HENT:     Head: Normocephalic and atraumatic.  Eyes:  General:        Right eye: No discharge.        Left eye: No discharge.  Cardiovascular:     Rate and Rhythm: Normal rate and regular rhythm.     Heart sounds: No murmur heard.    No friction rub. No gallop.  Pulmonary:     Effort: Pulmonary effort is normal.     Breath sounds: Normal breath sounds.  Abdominal:     General: Bowel sounds are normal.     Palpations: Abdomen is soft.     Comments: Patient with some focal tenderness in the left upper quadrant, hypoactive bowel sounds throughout, no rebound, rigidity, he does have some overall abdominal distention noted. No fluid wave.  Skin:    General: Skin is warm and dry.     Capillary Refill: Capillary  refill takes less than 2 seconds.  Neurological:     Mental Status: He is alert and oriented to person, place, and time.  Psychiatric:        Mood and Affect: Mood normal.        Behavior: Behavior normal.     ED Results / Procedures / Treatments   Labs (all labs ordered are listed, but only abnormal results are displayed) Labs Reviewed  COMPREHENSIVE METABOLIC PANEL - Abnormal; Notable for the following components:      Result Value   Potassium 5.4 (*)    Glucose, Bld 178 (*)    Calcium 11.1 (*)    All other components within normal limits  LIPASE, BLOOD  CBC  URINALYSIS, ROUTINE W REFLEX MICROSCOPIC    EKG None  Radiology CT ABDOMEN PELVIS W CONTRAST  Result Date: 04/21/2023 CLINICAL DATA:  Acute abdominal pain, history of prior bowel obstructions. EXAM: CT ABDOMEN AND PELVIS WITH CONTRAST TECHNIQUE: Multidetector CT imaging of the abdomen and pelvis was performed using the standard protocol following bolus administration of intravenous contrast. RADIATION DOSE REDUCTION: This exam was performed according to the departmental dose-optimization program which includes automated exposure control, adjustment of the mA and/or kV according to patient size and/or use of iterative reconstruction technique. CONTRAST:  75mL OMNIPAQUE IOHEXOL 350 MG/ML SOLN COMPARISON:  10/19/2021 FINDINGS: Lower chest: A very large hiatal hernia contains almost the entire stomach, part of the transverse colon, much of the pancreas, and part of the splenic vein. The entirety of the hernia extends well up into the chest and beyond the superior margin of today's abdomen CT. This has been shown on prior exams. Bandlike regions of infrahilar atelectasis are present in the left upper lobe, right middle lobe, and both lower lobes. There is also passive atelectasis in both lower lobes associated with the large hiatal hernia. Hepatobiliary: Stable hepatic morphology and appearance including a 2.4 cm simple cyst of the  right hepatic lobe on image 33 series 4. No further imaging workup of this lesion is indicated. Gallbladder unremarkable. Pancreas: Fatty atrophy. Much of the pancreas is intrathoracic due to the hernia. Overall stable. Spleen: Unremarkable Adrenals/Urinary Tract: Mildly prominent median lobe of the prostate gland indents the bladder base. Otherwise unremarkable. Stomach/Bowel: As noted above, there is a large hiatal hernia containing most of the stomach and part of the transverse colon. Mildly dilated loops proximal small bowel measuring up to 4.2 cm in diameter, extending to a transition point in the left abdomen about on image 55 of series 4 and image 169 of series 8 where there is mild angulation of a loop of bowel. Adhesion not excluded.  Other postoperative findings in the small bowel noted, not in proximity to the transition. Normal appendix.  Descending and sigmoid colon diverticulosis. Vascular/Lymphatic: Unremarkable Reproductive: Mild prostatomegaly.  This ectomy clips noted. Other: No supplemental non-categorized findings. Musculoskeletal: Posterolateral rod and pedicle screw fixation at L2-L3-L4, and also L5-S1 bilaterally. Interbody fusion at the involved levels and also at L4-5 with posterior decompression at the L5 level. No abnormal lucency along the screw tracks. IMPRESSION: 1. Mildly dilated loops of proximal small bowel measuring up to 4.2 cm in diameter, extending to a transition point in the left abdomen where there is mild angulation of a loop of bowel. Adhesion with mild/early small bowel obstruction is a possibility. 2. Very large hiatal hernia containing most of the stomach, part of the transverse colon, much of the pancreas, and part of the splenic vein. The entirety of the hernia extends well up into the chest and beyond the superior margin of today's abdomen CT. This has been shown on prior exams. 3. Descending and sigmoid colon diverticulosis. 4. Mild prostatomegaly. 5. Posterolateral  rod and pedicle screw fixation at L2-L3-L4, and also L5-S1 bilaterally. Interbody fusion at the involved levels and also at L4-5 with posterior decompression at the L5 level. No abnormal lucency along the screw tracks. Electronically Signed   By: Gaylyn Rong M.D.   On: 04/21/2023 11:57    Procedures Procedures    Medications Ordered in ED Medications  promethazine (PHENERGAN) 12.5 mg in sodium chloride 0.9 % 50 mL IVPB (0 mg Intravenous Stopped 04/21/23 1126)  ondansetron (ZOFRAN) injection 4 mg (has no administration in time range)  diatrizoate meglumine-sodium (GASTROGRAFIN) 66-10 % solution 90 mL (has no administration in time range)  ondansetron (ZOFRAN) injection 4 mg (4 mg Intravenous Given 04/21/23 0820)  morphine (PF) 4 MG/ML injection 4 mg (4 mg Intravenous Given 04/21/23 0820)  HYDROmorphone (DILAUDID) injection 1 mg (1 mg Intravenous Given 04/21/23 1101)  iohexol (OMNIPAQUE) 350 MG/ML injection 75 mL (75 mLs Intravenous Contrast Given 04/21/23 1108)    ED Course/ Medical Decision Making/ A&P Clinical Course as of 04/21/23 1406  Sat Apr 21, 2023  0956 Potassium(!): 5.4 [CP]  1234 Patient with history of interstitial lung disease, on reevaluation he was moderately hypoxic at rest, oxygen saturation 88 to 89% on room air.  He is not in any respiratory distress, he denies shortness of breath, placed on 2 L nasal cannula with improvement. [CP]    Clinical Course User Index [CP] Olene Floss, PA-C                                 Medical Decision Making  This patient is a 77 y.o. male  who presents to the ED for concern of abdominal pain, nausea.   Differential diagnoses prior to evaluation: The emergent differential diagnosis includes, but is not limited to,  The causes of generalized abdominal pain include but are not limited to AAA, mesenteric ischemia, appendicitis, diverticulitis, DKA, gastritis, gastroenteritis, AMI, nephrolithiasis, pancreatitis,  peritonitis, adrenal insufficiency,lead poisoning, iron toxicity, intestinal ischemia, constipation, UTI,SBO/LBO, splenic rupture, biliary disease, IBD, IBS, PUD, or hepatitis . This is not an exhaustive differential.   Past Medical History / Co-morbidities / Social History: chronic pain, gastroparesis, history of esophageal cancer status post esophageal resection, multiple small bowel obstructions, multiple hernia repair  Additional history: Chart reviewed. Pertinent results include: Reviewed outpatient cardiology, gastroenterology visits, as well as outpatient family medicine visits, patient sees  the family medicine team at [facility]  Physical Exam: Physical exam performed. The pertinent findings include: Patient with some focal tenderness in the left upper quadrant, hypoactive bowel sounds throughout, no rebound, rigidity, he does have some overall abdominal distention noted. No fluid wave.   Patient did have a few episodes of hypoxia on room air.  He endorses chronic interstitial lung disease.  He is not having any increased work of breathing, wheezing, rhonchi, and oxygen improved when placed on 2 L nasal cannula.  Lab Tests/Imaging studies: I personally interpreted labs/imaging and the pertinent results include: CBC overall unremarkable, lipase unremarkable, CMP notable for Some hyperkalemia noted which is new for patient, kidney function within normal range, he does have some hypercalcemia as well, calcium 11.1..  I independently interpreted CT abdomen pelvis with contrast which shows dilated loops of bowel concerning for early SBO, given patient's physical exam, and history of multiple frequent SBO I do think that this is quite likely and that he would benefit from NG tube decompression, and surgical consultation.  I agree with the radiologist interpretation.   Medications: I ordered medication including morphine, Dilaudid for pain, Zofran, Phenergan for nausea.  I have reviewed the  patients home medicines and have made adjustments as needed.   Consults: I spoke with the general surgeon on-call, Dr. Bedelia Person who agrees to consult on this patient for SBO, agrees with NG tube placement, recommends admission to family medicine, spoke with family medicine doctor, Dr Miquel Dunn, who after discussion of patient's lab work, imaging, clinical condition agrees to admission for SBO.  Disposition: After consideration of the diagnostic results and the patients response to treatment, I feel that patient would benefit from admission for small bowel obstruction as discussed above.   I discussed this case with my attending physician who cosigned this note including patient's presenting symptoms, physical exam, and planned diagnostics and interventions. Attending physician stated agreement with plan or made changes to plan which were implemented.   Attending physician assessed patient at bedside. Final Clinical Impression(s) / ED Diagnoses Final diagnoses:  Small bowel obstruction [facility])    Rx / DC Orders ED Discharge Orders     None         Olene Floss, PA-C 04/21/23 1406    Jacalyn Lefevre, MD 04/22/23 [phone]

## 2023-04-22 ENCOUNTER — Inpatient Hospital Stay (HOSPITAL_COMMUNITY): Payer: Medicare HMO

## 2023-04-22 DIAGNOSIS — I4891 Unspecified atrial fibrillation: Secondary | ICD-10-CM | POA: Diagnosis not present

## 2023-04-22 LAB — COMPREHENSIVE METABOLIC PANEL
ALT: 18 U/L (ref 0–44)
AST: 17 U/L (ref 15–41)
Albumin: 3.5 g/dL (ref 3.5–5.0)
Alkaline Phosphatase: 73 U/L (ref 38–126)
Anion gap: 10 (ref 5–15)
BUN: 18 mg/dL (ref 8–23)
CO2: 30 mmol/L (ref 22–32)
Calcium: 8.7 mg/dL — ABNORMAL LOW (ref 8.9–10.3)
Chloride: 100 mmol/L (ref 98–111)
Creatinine, Ser: 1.09 mg/dL (ref 0.61–1.24)
GFR, Estimated: >60 mL/min (ref >60)
Glucose, Bld: 121 mg/dL — ABNORMAL HIGH (ref 70–99)
Potassium: 4.2 mmol/L (ref 3.5–5.1)
Sodium: 140 mmol/L (ref 135–145)
Total Bilirubin: 0.5 mg/dL (ref 0.3–1.2)
Total Protein: 6.6 g/dL (ref 6.5–8.1)

## 2023-04-22 MED ORDER — ACETAMINOPHEN 10 MG/ML IV SOLN
1000.0000 mg | Freq: Four times a day (QID) | INTRAVENOUS | Status: DC
  Administered 2023-04-23: 1000 mg via INTRAVENOUS
  Filled 2023-04-22 (×5): qty 100

## 2023-04-22 MED ORDER — PANTOPRAZOLE SODIUM 40 MG IV SOLR
40.0000 mg | INTRAVENOUS | Status: DC
  Administered 2023-04-22 – 2023-04-23 (×2): 40 mg via INTRAVENOUS
  Filled 2023-04-22 (×2): qty 10

## 2023-04-22 MED ORDER — MELATONIN 3 MG PO TABS
3.0000 mg | ORAL_TABLET | Freq: Every day | ORAL | Status: DC
  Filled 2023-04-22: qty 1

## 2023-04-22 NOTE — Progress Notes (Signed)
Trauma/Critical Care Follow Up Note  Subjective:    Overnight Issues:   Objective:  Vital signs for last 24 hours: Temp:  [97.4 F (36.3 C)-98.2 F (36.8 C)] 97.7 F (36.5 C) (10/27 0559) Pulse Rate:  [73-104] 79 (10/27 0559) Resp:  [8-20] 16 (10/27 0559) BP: (110-149)/(60-88) 128/82 (10/27 0559) SpO2:  [89 %-99 %] 97 % (10/27 0559) Weight:  [95.5 kg] 95.5 kg (10/26 1557)  Hemodynamic parameters for last 24 hours:    Intake/Output from previous day: 10/26 0701 - 10/27 0700 In: 957 [I.V.:657.8; IV Piggyback:299.2] Out: 2300 [Urine:450; Emesis/NG output:1850]  Intake/Output this shift: No intake/output data recorded.  Vent settings for last 24 hours:    Physical Exam:  Gen: comfortable, no distress Neuro: follows commands, alert, communicative HEENT: PERRL Neck: supple CV: RRR Pulm: unlabored breathing on RA Abd: soft, NT    GU: urine clear and yellow, +spontaneous voids Extr: wwp, no edema  Results for orders placed or performed during the hospital encounter of 04/21/23 (from the past 24 hour(s))  Lipase, blood     Status: None   Collection Time: 04/21/23  8:16 AM  Result Value Ref Range   Lipase 25 11 - 51 U/L  Comprehensive metabolic panel     Status: Abnormal   Collection Time: 04/21/23  8:16 AM  Result Value Ref Range   Sodium 137 135 - 145 mmol/L   Potassium 5.4 (H) 3.5 - 5.1 mmol/L   Chloride 100 98 - 111 mmol/L   CO2 26 22 - 32 mmol/L   Glucose, Bld 178 (H) 70 - 99 mg/dL   BUN 15 8 - 23 mg/dL   Creatinine, Ser 4.09 0.61 - 1.24 mg/dL   Calcium 81.1 (H) 8.9 - 10.3 mg/dL   Total Protein 7.0 6.5 - 8.1 g/dL   Albumin 3.8 3.5 - 5.0 g/dL   AST 22 15 - 41 U/L   ALT 21 0 - 44 U/L   Alkaline Phosphatase 95 38 - 126 U/L   Total Bilirubin 0.6 0.3 - 1.2 mg/dL   GFR, Estimated >91 >47 mL/min   Anion gap 11 5 - 15  CBC     Status: None   Collection Time: 04/21/23  8:16 AM  Result Value Ref Range   WBC 9.0 4.0 - 10.5 K/uL   RBC 4.98 4.22 - 5.81 MIL/uL    Hemoglobin 16.7 13.0 - 17.0 g/dL   HCT 82.9 56.2 - 13.0 %   MCV 96.6 80.0 - 100.0 fL   MCH 33.5 26.0 - 34.0 pg   MCHC 34.7 30.0 - 36.0 g/dL   RDW 86.5 78.4 - 69.6 %   Platelets 177 150 - 400 K/uL   nRBC 0.0 0.0 - 0.2 %  Basic metabolic panel     Status: Abnormal   Collection Time: 04/21/23  5:24 PM  Result Value Ref Range   Sodium 134 (L) 135 - 145 mmol/L   Potassium 5.3 (H) 3.5 - 5.1 mmol/L   Chloride 99 98 - 111 mmol/L   CO2 25 22 - 32 mmol/L   Glucose, Bld 129 (H) 70 - 99 mg/dL   BUN 17 8 - 23 mg/dL   Creatinine, Ser 2.95 0.61 - 1.24 mg/dL   Calcium 8.8 (L) 8.9 - 10.3 mg/dL   GFR, Estimated >28 >41 mL/min   Anion gap 10 5 - 15  Urinalysis, Routine w reflex microscopic -Urine, Clean Catch     Status: Abnormal   Collection Time: 04/21/23 10:00 PM  Result  Value Ref Range   Color, Urine YELLOW YELLOW   APPearance CLEAR CLEAR   Specific Gravity, Urine >1.046 (H) 1.005 - 1.030   pH 5.0 5.0 - 8.0   Glucose, UA NEGATIVE NEGATIVE mg/dL   Hgb urine dipstick NEGATIVE NEGATIVE   Bilirubin Urine NEGATIVE NEGATIVE   Ketones, ur NEGATIVE NEGATIVE mg/dL   Protein, ur NEGATIVE NEGATIVE mg/dL   Nitrite NEGATIVE NEGATIVE   Leukocytes,Ua NEGATIVE NEGATIVE  Comprehensive metabolic panel     Status: Abnormal   Collection Time: 04/22/23  5:00 AM  Result Value Ref Range   Sodium 140 135 - 145 mmol/L   Potassium 4.2 3.5 - 5.1 mmol/L   Chloride 100 98 - 111 mmol/L   CO2 30 22 - 32 mmol/L   Glucose, Bld 121 (H) 70 - 99 mg/dL   BUN 18 8 - 23 mg/dL   Creatinine, Ser 1.61 0.61 - 1.24 mg/dL   Calcium 8.7 (L) 8.9 - 10.3 mg/dL   Total Protein 6.6 6.5 - 8.1 g/dL   Albumin 3.5 3.5 - 5.0 g/dL   AST 17 15 - 41 U/L   ALT 18 0 - 44 U/L   Alkaline Phosphatase 73 38 - 126 U/L   Total Bilirubin 0.5 0.3 - 1.2 mg/dL   GFR, Estimated >09 >60 mL/min   Anion gap 10 5 - 15    Assessment & Plan: The plan of care was discussed with the bedside nurse for the day, who is in agreement with this plan and no  additional concerns were raised.   Present on Admission:  SBO (small bowel obstruction) (HCC)    LOS: 1 day   Additional comments:I reviewed the patient's new clinical lab test results.   and I reviewed the patients new imaging test results.    SBO - SBO protocol, 1.8L out o/n, gastrografin in colon on 8h AXR. Known colon containing hiatal hernia, so will leave NG in place until cROBF. +flatus Large hiatal hernia - h/o prior robotic repair at [facility] in 2013, recommend o/p f/u FEN - cont NGT, NPO, okay for sips/chips DVT - SCDs, LMWH Dispo -  78M   Diamantina Monks, MD Trauma & General Surgery Please use [email or web address] to contact on call provider  04/22/2023  *Care during the described time interval was provided by me. I have reviewed this patient's available data, including medical history, events of note, physical examination and test results as part of my evaluation.

## 2023-04-22 NOTE — Assessment & Plan Note (Addendum)
Patient with history of esophageal cancer in remission, 6 prior SBOs.  1.8 L out NGT overnight.  -General Surgery consulted, appreciate their recommendations -Continue n.p.o., sips/chips -Continue NGT at this time

## 2023-04-22 NOTE — Hospital Course (Signed)
Thomas Dominguez is a 77 y.o.male with a history of Afib, BPH, GERD, h/o multiple SBO, h/o esophagectomy for severe Barrett's, history of hiatal hernia, HTN, HLD, ILD, h/o abdominal hernia repair who was admitted to the [facility] Teaching Service at [facility] for abdominal pain, vomiting, nausea. {Blank single:19197::"His","Her"} hospital course is detailed below:  Abdominal Pain  Nausea & Vomiting  H/o multiple SBO  In ED, CT abdomen and pelvis showed showed mildly dilated loops of small bowel as well as his previously known large hiatal hernia. GI and general surgery consulted in the ED and recommended NGT and SBO protocol. Clinical picture improved significantly after NGT placement, 1.8L removed. Passed flautus. Gastrographin showed bowel motility, tolerated PO sips with medicine. ***   Other chronic conditions were medically managed with home medications and formulary alternatives as necessary (***)  PCP Follow-up Recommendations:

## 2023-04-22 NOTE — Progress Notes (Signed)
New Admission Note:   Arrival Method: stretcher Mental Orientation: aa+ox4 Telemetry: N/A Assessment: Completed Skin: intact IV: right antecub Pain: 0/0 Tubes: NGT to LIS Safety Measures: Safety Fall Prevention Plan has been given, discussed and signed Admission: Completed [facility] [place] Orientation: Patient has been orientated to the room, unit and staff.  Family:  Orders have been reviewed and implemented. Will continue to monitor the patient. Call light has been placed within reach and bed alarm has been activated.   Margarita Grizzle, RN

## 2023-04-22 NOTE — Progress Notes (Signed)
Daily Progress Note Intern Pager: [phone]  Patient name: Thomas Dominguez. Meek Medical record number: [id number] Date of birth: [birth year] Age: 77 y.o. Gender: male  Primary Care Provider: Shelby Mattocks, DO Consultants: GI, general surgery Code Status: Full  Pt Overview and Major Events to Date:  10/26: Admitted to FMTS  Assessment and Plan: Windy Fast L. Miske is a 77 y.o. male who presented with several hours of acute abdominal pain, nausea, vomiting, and lack of flatulence and normal bowel movements. He has an extensive history of GI surgeries and pathology, including multiple previous SBOs.   Gastrografin abdominal x-ray with evidence of bowel motility, which is reassuring.  Will continue as below. Assessment & Plan SBO (small bowel obstruction) (HCC) Patient with history of esophageal cancer in remission, 6 prior SBOs.  1.8 L out NGT overnight.  -General Surgery consulted, appreciate their recommendations -Continue n.p.o., sips/chips -Continue NGT at this time   Chronic and Stable Conditions: A-fib: anticoagulation with lovenox, IV metoprolol for now and can transition to PO as able, will hold propafenone for now (held last admission for same) BPH: Hold proscar and flomax while NPO GERD: Hold protonix and pepcid while NPO   FEN/GI: NPO, sips and chips PPx: Lovenox Dispo:Home pending clinical improvement .   Subjective:  Patient seen sitting up in bed this morning NG tube in place.  He is without complaint.  Denies headache, dizziness, shortness of breath, abdominal pain.  No concerns at this time.  Objective: Temp:  [97.7 F (36.5 C)-98.2 F (36.8 C)] 97.7 F (36.5 C) (10/27 0559) Pulse Rate:  [76-104] 79 (10/27 0559) Resp:  [8-20] 16 (10/27 0559) BP: (110-149)/(60-88) 128/82 (10/27 0559) SpO2:  [89 %-99 %] 97 % (10/27 0559) Weight:  [95.5 kg] 95.5 kg (10/26 1557) Physical Exam: General: Alert, pleasant, NAD Cardiovascular: RRR, no murmurs Respiratory: CTA  bilaterally, on room air Abdomen: Very distant bowel sounds, abdomen distended, no tenderness to palpation Extremities: Moves all extremities equally and spontaneously.  No LE edema bilaterally.  2+ pedal pulses bilaterally.  Laboratory: Most recent CBC Lab Results  Component Value Date   WBC 9.0 04/21/2023   HGB 16.7 04/21/2023   HCT 48.1 04/21/2023   MCV 96.6 04/21/2023   PLT 177 04/21/2023   Most recent BMP    Latest Ref Rng & Units 04/22/2023    5:00 AM  BMP  Glucose 70 - 99 mg/dL 425   BUN 8 - 23 mg/dL 18   Creatinine 9.56 - 1.24 mg/dL 3.87   Sodium 564 - 332 mmol/L 140   Potassium 3.5 - 5.1 mmol/L 4.2   Chloride 98 - 111 mmol/L 100   CO2 22 - 32 mmol/L 30   Calcium 8.9 - 10.3 mg/dL 8.7    UA WNL  95/18 SBO protocol imaging gastrografin is visualized in colon on 8 hour AXR.   Cyndia Skeeters, DO 04/22/2023, 8:38 AM  PGY-1, [facility] Family Medicine FPTS Intern pager: [phone], text pages welcome Secure chat group [facility] Teaching Service

## 2023-04-22 NOTE — Plan of Care (Signed)

## 2023-04-23 DIAGNOSIS — I4891 Unspecified atrial fibrillation: Secondary | ICD-10-CM | POA: Diagnosis not present

## 2023-04-23 LAB — BASIC METABOLIC PANEL
Anion gap: 10 (ref 5–15)
BUN: 18 mg/dL (ref 8–23)
CO2: 29 mmol/L (ref 22–32)
Calcium: 9.1 mg/dL (ref 8.9–10.3)
Chloride: 100 mmol/L (ref 98–111)
Creatinine, Ser: 1.05 mg/dL (ref 0.61–1.24)
GFR, Estimated: >60 mL/min (ref >60)
Glucose, Bld: 94 mg/dL (ref 70–99)
Potassium: 4 mmol/L (ref 3.5–5.1)
Sodium: 139 mmol/L (ref 135–145)

## 2023-04-23 LAB — MAGNESIUM: Magnesium: 2.3 mg/dL (ref 1.7–2.4)

## 2023-04-23 MED ORDER — PANTOPRAZOLE SODIUM 40 MG PO TBEC
40.0000 mg | DELAYED_RELEASE_TABLET | Freq: Every day | ORAL | Status: DC
  Administered 2023-04-24: 40 mg via ORAL
  Filled 2023-04-23: qty 1

## 2023-04-23 MED ORDER — PROMETHAZINE HCL 25 MG PO TABS: 12.5000 mg | ORAL_TABLET | Freq: Four times a day (QID) | ORAL | Status: DC | PRN

## 2023-04-23 MED ORDER — ACETAMINOPHEN 325 MG PO TABS: 650.0000 mg | ORAL_TABLET | Freq: Four times a day (QID) | ORAL | Status: DC | PRN

## 2023-04-23 MED ORDER — METOPROLOL SUCCINATE ER 25 MG PO TB24
25.0000 mg | ORAL_TABLET | Freq: Every day | ORAL | Status: DC
  Administered 2023-04-23 – 2023-04-24 (×2): 25 mg via ORAL
  Filled 2023-04-23 (×2): qty 1

## 2023-04-23 MED ORDER — OXYCODONE HCL 5 MG PO TABS: 5.0000 mg | ORAL_TABLET | Freq: Two times a day (BID) | ORAL | Status: DC | PRN

## 2023-04-23 MED ORDER — MELATONIN 3 MG PO TABS
3.0000 mg | ORAL_TABLET | Freq: Once | ORAL | Status: AC
  Administered 2023-04-23: 3 mg via ORAL

## 2023-04-23 NOTE — Progress Notes (Addendum)
   Trauma/Critical Care Follow Up Note  Subjective:    Overnight Issues:  NAEO. Reports increasing flatus and decreased distention. No BM yet. Taking in some ice chips and using chloraseptic spray.  Objective:  Vital signs for last 24 hours: Temp:  [97.9 F (36.6 C)-98.4 F (36.9 C)] 98 F (36.7 C) (10/28 0721) Pulse Rate:  [80-99] 80 (10/28 0721) Resp:  [16-20] 18 (10/28 0721) BP: (124-149)/(74-95) 137/80 (10/28 0721) SpO2:  [97 %-98 %] 98 % (10/28 0721)  Hemodynamic parameters for last 24 hours:    Intake/Output from previous day: 10/27 0701 - 10/28 0700 In: 1890.3 [P.O.:360; I.V.:1380.3; IV Piggyback:150] Out: 1450 [Urine:300; Emesis/NG output:1150]  Intake/Output this shift: Total I/O In: 120 [P.O.:120] Out: -   Vent settings for last 24 hours:    Physical Exam:  Gen: comfortable, no distress Neuro: follows commands, alert, communicative HEENT: PERRL Neck: supple CV: RRR Pulm: unlabored breathing on RA Abd: soft, NT   mild distention Extr: wwp, no edema  Results for orders placed or performed during the hospital encounter of 04/21/23 (from the past 24 hour(s))  Basic metabolic panel     Status: None   Collection Time: 04/23/23  4:49 AM  Result Value Ref Range   Sodium 139 135 - 145 mmol/L   Potassium 4.0 3.5 - 5.1 mmol/L   Chloride 100 98 - 111 mmol/L   CO2 29 22 - 32 mmol/L   Glucose, Bld 94 70 - 99 mg/dL   BUN 18 8 - 23 mg/dL   Creatinine, Ser 9.52 0.61 - 1.24 mg/dL   Calcium 9.1 8.9 - 84.1 mg/dL   GFR, Estimated >32 >44 mL/min   Anion gap 10 5 - 15  Magnesium     Status: None   Collection Time: 04/23/23  4:49 AM  Result Value Ref Range   Magnesium 2.3 1.7 - 2.4 mg/dL    Assessment & Plan: The plan of care was discussed with the bedside nurse for the day, who is in agreement with this plan and no additional concerns were raised.   Present on Admission:  SBO (small bowel obstruction) (HCC)    LOS: 2 days   Additional comments:I reviewed the  patient's new clinical lab test results.   and I reviewed the patients new imaging test results.    SBO - SBO protocol >> gastrografin in colon proximal to/involving the area that enters the [facility]. leave NG in place until cROBF. +flatus. NG clamped today; pt had a BM this afternoon and no residuals when NG was replaced to suction. D/C NG and start FLD.   Large hiatal hernia - h/o prior robotic repair at [facility] in 2013, recommend o/p f/u FEN - FLD DVT - SCDs, LMWH Dispo -  56M   Hosie Spangle, PA-C Trauma & General Surgery Please use [email or web address] to contact on call provider  04/23/2023  *Care during the described time interval was provided by me. I have reviewed this patient's available data, including medical history, events of note, physical examination and test results as part of my evaluation.

## 2023-04-23 NOTE — Plan of Care (Signed)
  Problem: Education: Goal: Knowledge of General Education information will improve Description: Including pain rating scale, medication(s)/side effects and non-pharmacologic comfort measures Outcome: Progressing   Problem: Health Behavior/Discharge Planning: Goal: Ability to manage health-related needs will improve Outcome: Progressing   Problem: Clinical Measurements: Goal: Ability to maintain clinical measurements within normal limits will improve Outcome: Progressing Goal: Will remain free from infection Outcome: Progressing Goal: Diagnostic test results will improve Outcome: Progressing Goal: Respiratory complications will improve Outcome: Progressing Goal: Cardiovascular complication will be avoided Outcome: Progressing   Problem: Activity: Goal: Risk for activity intolerance will decrease Outcome: Progressing   Problem: Nutrition: Goal: Adequate nutrition will be maintained Outcome: Progressing   Problem: Elimination: Goal: Will not experience complications related to bowel motility Outcome: Progressing Goal: Will not experience complications related to urinary retention Outcome: Progressing   Problem: Pain Management: Goal: General experience of comfort will improve Outcome: Progressing   Problem: Safety: Goal: Ability to remain free from injury will improve Outcome: Progressing   Problem: Skin Integrity: Goal: Risk for impaired skin integrity will decrease Outcome: Progressing

## 2023-04-23 NOTE — TOC CM/SW Note (Addendum)
Transition of Care [facility]) - Inpatient Brief Assessment   Patient Details  Name: Thomas Dominguez. Everage MRN: [id number] Date of Birth: 1946-01-06  Transition of Care [facility]) CM/SW Contact:    Tom-Johnson, Hershal Coria, RN Phone Number: 04/23/2023, 2:34 PM   Clinical Narrative:  Patient presented to the ED with Acute Abdominal Pain, Nausea and Vomiting. NG Tube placed, symptoms improved, no surgical intervention at this time. Patient with Hx of SBO and multiple surgeries. Gen Sx following.  On Eliquis for A-Fib at home, currently on Lovenox.  From home with wife, has two supportive children. Retired, independent with care and drive self. Has access to a cane, walker, shower seat and grab bars at home.  PCP is Shelby Mattocks, DO and uses [facility] on [address] and also [employer].   No TOC needs or recommendations noted at this time.  Patient not Medically ready for discharge.  CM will continue to follow as patient progresses with care towards discharge.         Transition of Care Asessment: Insurance and Status: Insurance coverage has been reviewed Patient has primary care physician: Yes Home environment has been reviewed: Yes Prior level of function:: Independent Prior/Current Home Services: No current home services Social Determinants of Health Reivew: SDOH reviewed no interventions necessary Readmission risk has been reviewed: Yes Transition of care needs: no transition of care needs at this time

## 2023-04-23 NOTE — Assessment & Plan Note (Addendum)
Patient with history of esophageal cancer in remission, 6 prior SBOs.  1.8 L out NGT overnight.  Continued to improve overnight. Received as needed Phenergan x1 overnight.  No new complaints this morning.  Passed BM, no residuals.  -General surgery: NG withdrawn 10/28, begin liquid diet -Continue n.p.o., sips/chips -Continue NGT at this time

## 2023-04-23 NOTE — Progress Notes (Addendum)
Daily Progress Note Intern Pager: [phone]  Patient name: Thomas Dominguez. Lavery Medical record number: [id number] Date of birth: [birth year] Age: 77 y.o. Gender: male  Primary Care Provider: Shelby Mattocks, DO Consultants: General Surgery, GI Code Status: Full  Pt Overview and Major Events to Date:   Jazzman L. Brunkhorst is a 77 y.o. male who presented with several hours of acute abdominal pain, nausea, vomiting, and lack of flatulence and normal bowel movements. He has an extensive history of GI surgeries and pathology, including multiple previous SBOs.  Patient appears well this morning. Assessment & Plan SBO (small bowel obstruction) (HCC) Patient with history of esophageal cancer in remission, 6 prior SBOs.  1.8 L out NGT overnight.  Continued to improve overnight. Received as needed Phenergan x1 overnight.  No new complaints this morning.  Passed BM, no residuals.  -General surgery: NG withdrawn 10/28, begin liquid diet -Continue n.p.o., sips/chips -Continue NGT at this time  Chronic and Stable Issues:  A-fib: anticoagulation with lovenox, IV metoprolol for now and can transition to PO as able consider transition to PO before discharge, will hold propafenone for now (held last admission for same) BPH: Hold proscar and flomax while NPO GERD: Hold protonix and pepcid while NPO   FEN/GI: Liquid diet PPx: Lovenox Dispo:Home pending clinical improvement .   Subjective:   On interview, no new complaints.  No pain.  Passing flatus.  Walked "up to a mile" yesterday around hospital.   Objective:  BP: 137/80 HR: 80 RR: 18 T: Afebrile O2sat: 90% on room air  Significant vitals over past 24 hours: None  Physical Exam:  General: Alert, pleasant, NAD  Cardiovascular: RRR, no murmurs Respiratory: CTA bilaterally, on room air Abdomen: Distant bowel sounds, abdomen distended, no tenderness to palpation Extremities: Moves all extremities equally and spontaneously.  No LE edema bilaterally.    Basic labs:  Most recent CBC Lab Results  Component Value Date   WBC 9.0 04/21/2023   HGB 16.7 04/21/2023   HCT 48.1 04/21/2023   MCV 96.6 04/21/2023   PLT 177 04/21/2023   Most recent BMP    Latest Ref Rng & Units 04/23/2023    4:49 AM  BMP  Glucose 70 - 99 mg/dL 94   BUN 8 - 23 mg/dL 18   Creatinine 1.66 - 1.24 mg/dL 0.63   Sodium 016 - 010 mmol/L 139   Potassium 3.5 - 5.1 mmol/L 4.0   Chloride 98 - 111 mmol/L 100   CO2 22 - 32 mmol/L 29   Calcium 8.9 - 10.3 mg/dL 9.1     Other pertinent labs:  Magnesium: 2.3  Imaging/Diagnostic Tests:  SBO protocol abdominal x-ray:  IMPRESSION: Enteric contrast is visualized within the colon.  Tomie China, MD 04/23/2023, 7:04 AM  PGY-1, [facility] Family Medicine FPTS Intern pager: [phone], text pages welcome Secure chat group [facility] Teaching Service

## 2023-04-24 ENCOUNTER — Ambulatory Visit: Payer: Medicare HMO | Admitting: Gastroenterology

## 2023-04-24 DIAGNOSIS — I4891 Unspecified atrial fibrillation: Secondary | ICD-10-CM | POA: Diagnosis not present

## 2023-04-24 LAB — BASIC METABOLIC PANEL
Anion gap: 11 (ref 5–15)
BUN: 11 mg/dL (ref 8–23)
CO2: 23 mmol/L (ref 22–32)
Calcium: 8.7 mg/dL — ABNORMAL LOW (ref 8.9–10.3)
Chloride: 103 mmol/L (ref 98–111)
Creatinine, Ser: 0.95 mg/dL (ref 0.61–1.24)
GFR, Estimated: >60 mL/min (ref >60)
Glucose, Bld: 146 mg/dL — ABNORMAL HIGH (ref 70–99)
Potassium: 3.6 mmol/L (ref 3.5–5.1)
Sodium: 137 mmol/L (ref 135–145)

## 2023-04-24 NOTE — Progress Notes (Incomplete)
Daily Progress Note Intern Pager: [phone]  Patient name: Thomas Dominguez. Thomas Dominguez Medical record number: [id number] Date of birth: [birth year] Age: 77 y.o. Gender: male  Primary Care Provider: Shelby Mattocks, DO Consultants: General Surgery, GI Code Status: Full  Pt Overview and Major Events to Date:   Thomas Dominguez is a 77 y.o. male who presented with several hours of acute abdominal pain, nausea, vomiting, and lack of flatulence and normal bowel movements. He has an extensive history of GI surgeries and pathology, including multiple previous SBOs.  Patient appears well-cleared for discharge from medical standpoint.  Appreciate GI recs. Assessment & Plan SBO (small bowel obstruction) (HCC) Patient with history of esophageal cancer in remission, 6 prior SBOs.  1.8 L out NGT overnight.  Continued to improve overnight. No new complaints this morning.  Passed BM x2, no residuals.  Tolerating liquid diet well, eating pudding. -General surgery: NG withdrawn 10/28, continued liquid diet -Appreciate further recs per GI  Chronic and Stable Issues:  A-fib: anticoagulation with lovenox, switched to p.o. metoprolol will hold propafenone for now (held last admission for same) BPH: Hold proscar and flomax while NPO GERD: Hold protonix and pepcid while NPO   FEN/GI: Liquid diet PPx: Lovenox Dispo:Home pending clinical improvement .   Subjective:   On interview, patient at baseline.  No new complaints.  Feeling fine.  In good spirits.  Passed another BM overnight.  Eating liquid snacks.  Feels "undistended".  Objective:  BP: 135/75 HR: 85 RR: 20 T: 97.7 O2sat: 99% on room air  Significant vitals over past 24 hours: None  Physical Exam:  General: Alert, pleasant, NAD  Cardiovascular: RRR, no murmurs Respiratory: CTA bilaterally, on room air Abdomen: Distant bowel sounds, no distention or tenderness to palpation Extremities: Moves all extremities equally and spontaneously.  No LE edema  bilaterally.   Basic labs:  Most recent CBC Lab Results  Component Value Date   WBC 9.0 04/21/2023   HGB 16.7 04/21/2023   HCT 48.1 04/21/2023   MCV 96.6 04/21/2023   PLT 177 04/21/2023   Most recent BMP    Latest Ref Rng & Units 04/23/2023    4:49 AM  BMP  Glucose 70 - 99 mg/dL 94   BUN 8 - 23 mg/dL 18   Creatinine 1.47 - 1.24 mg/dL 8.29   Sodium 562 - 130 mmol/L 139   Potassium 3.5 - 5.1 mmol/L 4.0   Chloride 98 - 111 mmol/L 100   CO2 22 - 32 mmol/L 29   Calcium 8.9 - 10.3 mg/dL 9.1     Other pertinent labs:  No new labs.  Imaging/Diagnostic Tests:  No new imaging.  Tomie China, MD 04/24/2023, 7:16 AM  PGY-1, [facility] Family Medicine FPTS Intern pager: [phone], text pages welcome Secure chat group [facility] Teaching Service

## 2023-04-24 NOTE — Discharge Instructions (Signed)
Dear Thomas Dominguez,  Thank you for letting me be part of your care team. Please follow these instructions:  1) Take your medications as prescribed. Instructions are included in this packet.

## 2023-04-24 NOTE — Discharge Summary (Addendum)
Family Medicine Teaching [facility] Discharge Summary  Patient name: Thomas Dominguez Medical record number: [id number] Date of birth: [birth year] Age: 77 y.o. Gender: male Date of Admission: 04/21/2023  Date of Discharge: 04/24/2023 Admitting Physician: Gerrit Heck, DO  Primary Care Provider: Shelby Mattocks, DO Consultants: general surgery   Indication for Hospitalization: SBO  Discharge Diagnoses/Problem List:  Principal Problem for Admission: SBO Other Problems addressed during stay:  Active Problems:   SBO (small bowel obstruction) [facility])  Brief Hospital Course:  Thomas Dominguez is a 77 y.o.male with a history of Afib, BPH, GERD, h/o multiple SBO, h/o esophagectomy for severe Barrett's, history of hiatal hernia, HTN, HLD, ILD, h/o abdominal hernia repair who was admitted to the [facility] Teaching Service at [facility] for abdominal pain, vomiting, nausea. His hospital course is detailed below:  Abdominal Pain  Nausea & Vomiting  H/o multiple SBO  In ED, CT abdomen and pelvis showed showed mildly dilated loops of small bowel as well as his previously known large hiatal hernia. GI and general surgery consulted in the ED and recommended NGT and SBO protocol. Clinical picture improved significantly after NGT placement, 1.8L removed. Passed flautus. Gastrographin showed bowel motility, tolerated PO sips with medicine.  Passed BMs before discharge. Surgery cleared to advanced diet as tolerated.   Other chronic conditions were medically managed with home medications and formulary alternatives as necessary  PCP Follow-up Recommendations: P.o. intake, advance diet to regular. Monitor BMs  Disposition: Home   Discharge Condition: Stable  Discharge Exam:  Vitals:   04/24/23 0450 04/24/23 0914  BP: 135/75 136/80  Pulse: 85 78  Resp: 20 18  Temp: 97.7 F (36.5 C) 98.2 F (36.8 C)  SpO2: 99% 99%   General: Alert, pleasant, NAD  Cardiovascular: RRR, no murmurs Respiratory: CTA  bilaterally, on room air Abdomen: Distant bowel sounds, no distention or tenderness to palpation Extremities: Moves all extremities equally and spontaneously.  No LE edema bilaterally.   Significant Procedures: NG tube placement 10/27  Significant Labs and Imaging:  No results for input(s): "WBC", "HGB", "HCT", "PLT" in the last 48 hours. Recent Labs  Lab 04/23/23 0449 04/24/23 0803  NA 139 137  K 4.0 3.6  CL 100 103  CO2 29 23  GLUCOSE 94 146*  BUN 18 11  CREATININE 1.05 0.95  CALCIUM 9.1 8.7*  MG 2.3  --    Pertinent Imaging  CT AP (10/26):   IMPRESSION: 1. Mildly dilated loops of proximal small bowel measuring up to 4.2 cm in diameter, extending to a transition point in the left abdomen where there is mild angulation of a loop of bowel. Adhesion with mild/early small bowel obstruction is a possibility. 2. Very large hiatal hernia containing most of the stomach, part of the transverse colon, much of the pancreas, and part of the splenic vein. The entirety of the hernia extends well up into the chest and beyond the superior margin of today's abdomen CT. This has been shown on prior exams. 3. Descending and sigmoid colon diverticulosis. 4. Mild prostatomegaly. 5. Posterolateral rod and pedicle screw fixation at L2-L3-L4, and also L5-S1 bilaterally. Interbody fusion at the involved levels and also at L4-5 with posterior decompression at the L5 level. No abnormal lucency along the screw tracks.  XR abdomen SBO Position Verification (10/26):   IMPRESSION: 1. Enteric catheter tip and side port likely within the the large hiatal hernia within the right hemithorax, as demonstrated on preceding CT exam.  XR abdomen SBO Protocol Initial (  10/27):   IMPRESSION: Enteric contrast is visualized within the colon.  Results/Tests Pending at Time of Discharge: None  Discharge Medications:  Allergies as of 04/24/2023       Reactions   Mobic [meloxicam] Shortness Of Breath,  Other (See Comments)   Myalgias   Nsaids Other (See Comments)   Pt said he was told to avoid due to stomach issues   Statins Other (See Comments)   Myalgias        Medication List     TAKE these medications    acetaminophen 500 MG tablet Commonly known as: TYLENOL Take 500-1,000 mg by mouth daily as needed for moderate pain (pain score 4-6), fever or headache.   apixaban 5 MG Tabs tablet Commonly known as: ELIQUIS Take 5 mg by mouth 2 (two) times daily.   Centrum Silver 50+Men Tabs Take 1 tablet by mouth daily after breakfast.   famotidine 20 MG tablet Commonly known as: PEPCID TAKE 1 TABLET AT BEDTIME What changed:  when to take this reasons to take this   finasteride 5 MG tablet Commonly known as: PROSCAR TAKE 1 TABLET EVERY DAY, PLEASE MAKE ANNUAL APPOINTMENT BEFORE NEXT REFILL What changed: See the new instructions.   hyoscyamine 0.125 MG SL tablet Commonly known as: LEVSIN SL DISSOLVE 1 TABLET UNDER THE TONGUE TWICE DAILY (NEED APPOINTMENT) AS NEEDED   metoprolol succinate 25 MG 24 hr tablet Commonly known as: TOPROL-XL TAKE 1 TABLET EVERY DAY (STOP LOPRESSOR) What changed: See the new instructions.   omeprazole 40 MG capsule Commonly known as: PRILOSEC TAKE 1 CAPSULE TWICE DAILY   ondansetron 4 MG tablet Commonly known as: ZOFRAN TAKE 1 TABLET EVERY EIGHT HOURS AS NEEDED FOR NAUSEA OR VOMITING.   propafenone 225 MG tablet Commonly known as: RYTHMOL TAKE 1 TABLET EVERY 8 HOURS   tamsulosin 0.4 MG Caps capsule Commonly known as: FLOMAX TAKE 2 CAPSULES EVERY DAY What changed:  how much to take when to take this   traMADol 50 MG tablet Commonly known as: ULTRAM Take 1 tablet (50 mg total) by mouth 2 (two) times daily as needed for moderate pain. Needs appointment for further refills.   venlafaxine XR 150 MG 24 hr capsule Commonly known as: EFFEXOR-XR TAKE 1 CAPSULE EVERY DAY WITH BREAKFAST        Discharge Instructions: Please refer to  Patient Instructions section of EMR for full details.  Patient was counseled important signs and symptoms that should prompt return to medical care, changes in medications, dietary instructions, activity restrictions, and follow up appointments.   Follow-Up Appointments:  Gastrology: 11/4 11:00 AM  Cardiology: 11/6 1:30 PM   Tomie China, MD 04/24/2023, 12:44 PM PGY-1, [facility]   Upper Level Addendum:  I have seen and evaluated this patient along with Dr. Okey Dupre and reviewed the above note, making necessary revisions.  Darral Dash, DO 04/24/2023, 1:56 PM PGY-3, [place] [facility]

## 2023-04-24 NOTE — Assessment & Plan Note (Deleted)
Patient with history of esophageal cancer in remission, 6 prior SBOs.  1.8 L out NGT overnight.  Continued to improve overnight. Received as needed Phenergan x1 overnight.  No new complaints this morning.  Passed BM, no residuals.  -General surgery: NG withdrawn 10/28, begin liquid diet -Continue n.p.o., sips/chips -Continue NGT at this time

## 2023-04-24 NOTE — Progress Notes (Signed)
Patient ID: Windy Fast L. Dudzik, male   DOB: 1945-10-14, 77 y.o.   MRN: [id number] [facility] Progress Note     Subjective: CC-  Denies abdominal pain, n/v. Tolerating full liquids. Passing flatus and had another BM.  Objective: Vital signs in last 24 hours: Temp:  [97.7 F (36.5 C)-98.3 F (36.8 C)] 98.2 F (36.8 C) (10/29 0914) Pulse Rate:  [78-108] 78 (10/29 0914) Resp:  [18-20] 18 (10/29 0914) BP: (128-142)/(56-85) 136/80 (10/29 0914) SpO2:  [98 %-99 %] 99 % (10/29 0914) Last BM Date : 04/24/23  Intake/Output from previous day: 10/28 0701 - 10/29 0700 In: 240 [P.O.:240] Out: 0  Intake/Output this shift: Total I/O In: 240 [P.O.:240] Out: -   PE: Gen:  Alert, NAD, pleasant Abd: soft, NT, mild distention   Lab Results:  No results for input(s): "WBC", "HGB", "HCT", "PLT" in the last 72 hours. BMET Recent Labs    04/23/23 0449 04/24/23 0803  NA 139 137  K 4.0 3.6  CL 100 103  CO2 29 23  GLUCOSE 94 146*  BUN 18 11  CREATININE 1.05 0.95  CALCIUM 9.1 8.7*   PT/INR No results for input(s): "LABPROT", "INR" in the last 72 hours. CMP     Component Value Date/Time   NA 137 04/24/2023 0803   NA 136 07/31/2022 1042   K 3.6 04/24/2023 0803   CL 103 04/24/2023 0803   CO2 23 04/24/2023 0803   GLUCOSE 146 (H) 04/24/2023 0803   BUN 11 04/24/2023 0803   BUN 13 07/31/2022 1042   CREATININE 0.95 04/24/2023 0803   CALCIUM 8.7 (L) 04/24/2023 0803   PROT 6.6 04/22/2023 0500   PROT 6.9 05/18/2020 1253   ALBUMIN 3.5 04/22/2023 0500   ALBUMIN 4.3 05/18/2020 1253   AST 17 04/22/2023 0500   ALT 18 04/22/2023 0500   ALKPHOS 73 04/22/2023 0500   BILITOT 0.5 04/22/2023 0500   BILITOT 0.3 05/18/2020 1253   GFRNONAA >60 04/24/2023 0803   GFRAA 99 05/18/2020 1253   Lipase     Component Value Date/Time   LIPASE 25 04/21/2023 0816       Studies/Results: No results found.  Anti-infectives: Anti-infectives (From admission, onward)    None         Assessment/Plan Recurrent SBO  - obstruction resolving. On full liquids, advance diet as tolerated. Ok for discharge from surgical standpoint - this was communicated with the primary team.   Large hiatal hernia - h/o prior robotic repair at [facility] in 2013, recommend o/p f/u  FEN - FLD DVT - SCDs, LMWH Dispo -  24M   I reviewed last 24 h vitals and pain scores, last 48 h intake and output, last 24 h labs and trends, and last 24 h imaging results.    LOS: 3 days    Franne Forts, [facility] Surgery 04/24/2023, 10:15 AM Please see Amion for pager number during day hours 7:00am-4:30pm

## 2023-04-24 NOTE — Assessment & Plan Note (Deleted)
Patient with history of esophageal cancer in remission, 6 prior SBOs.  1.8 L out NGT overnight.  Continued to improve overnight. No new complaints this morning.  Passed BM x2, no residuals.  Tolerating liquid diet well, eating pudding. -General surgery: NG withdrawn 10/28, continued liquid diet -Appreciate further recs per GI

## 2023-04-24 NOTE — TOC Transition Note (Signed)
Transition of Care [facility]) - CM/SW Discharge Note   Patient Details  Name: Thomas Dominguez. Mantle MRN: [id number] Date of Birth: 1945/09/22  Transition of [facility]) CM/SW Contact:  Tom-Johnson, Hershal Coria, RN Phone Number: 04/24/2023, 10:57 AM   Clinical Narrative:     Patient is scheduled for discharge today.  Readmission Risk Assessment done. Hospital f/u and discharge instructions on AVS. No TOC needs or recommendations noted. Wife, Britta Mccreedy to transport at discharge.  No further TOC needs noted.        Final next level of care: Home/Self Care Barriers to Discharge: Barriers Resolved   Patient Goals and CMS Choice CMS Medicare.gov Compare Post Acute Care list provided to:: Patient Choice offered to / list presented to : NA  Discharge Placement                  Patient to be transferred to facility by: Wife Name of family member notified: [facility] Services Additional resources added to the After Visit Summary for                  DME Arranged: N/A DME Agency: NA       HH Arranged: NA HH Agency: NA        Social Determinants of Health (SDOH) Interventions SDOH Screenings   Food Insecurity: No Food Insecurity (04/21/2023)  Housing: Low Risk  (04/21/2023)  Transportation Needs: No Transportation Needs (04/21/2023)  Utilities: Not At Risk (04/21/2023)  Alcohol Screen: Low Risk  (11/04/2022)  Depression (PHQ2-9): Low Risk  (11/21/2022)  Financial Resource Strain: Low Risk  (11/04/2022)  Physical Activity: Sufficiently Active (11/04/2022)  Social Connections: Socially Integrated (11/04/2022)  Stress: No Stress Concern Present (11/04/2022)  Tobacco Use: Low Risk  (04/21/2023)     Readmission Risk Interventions    04/23/2023    2:34 PM  Readmission Risk Prevention Plan  Post Dischage Appt Complete  Medication Screening Complete  Transportation Screening Complete

## 2023-04-24 NOTE — Progress Notes (Signed)
DISCHARGE NOTE HOME Thomas Dominguez to be discharged Home per MD order. Discussed prescriptions and follow up appointments with the patient. Prescriptions given to patient; medication list explained in detail. Patient verbalized understanding.  Skin clean, dry and intact without evidence of skin break down, no evidence of skin tears noted. IV catheter discontinued intact. Site without signs and symptoms of complications. Dressing and pressure applied. Pt denies pain at the site currently. No complaints noted.  Patient free of lines, drains, and wounds.   An After Visit Summary (AVS) was printed and given to the patient. Patient escorted via wheelchair, and discharged home via private auto.  Velia Meyer, RN

## 2023-04-25 ENCOUNTER — Telehealth: Payer: Self-pay

## 2023-04-25 NOTE — Transitions of Care (Post Inpatient/ED Visit) (Signed)
04/25/2023  Name: Thomas Dominguez. Thomas Dominguez MRN: [id number] DOB: Jul 29, 1945  Today's TOC FU Call Status: Today's TOC FU Call Status:: Successful TOC FU Call Completed TOC FU Call Complete Date: 04/25/23 Patient's Name and Date of Birth confirmed.  Transition Care Management Follow-up Telephone Call Date of Discharge: 04/24/23 Discharge Facility: [facility]) Type of Discharge: Inpatient Admission Primary Inpatient Discharge Diagnosis:: Small Bowel Obstruction How have you been since you were released from the hospital?: Better (Patient notes he feels great) Any questions or concerns?: No  Items Reviewed: Did you receive and understand the discharge instructions provided?: Yes Medications obtained,verified, and reconciled?: Yes (Medications Reviewed) Any new allergies since your discharge?: No Dietary orders reviewed?: No Do you have support at home?: Yes People in Home: spouse Name of Support/Comfort Primary Source: Thomas Dominguez  Medications Reviewed Today: Medications Reviewed Today     Reviewed by Jodelle Gross, RN (Case Manager) on 04/25/23 at 1117  Med List Status: <None>   Medication Order Taking? Sig Documenting Provider Last Dose Status Informant  acetaminophen (TYLENOL) 500 MG tablet [id number] Yes Take 500-1,000 mg by mouth daily as needed for moderate pain (pain score 4-6), fever or headache. [provider] Taking Active Self  apixaban (ELIQUIS) 5 MG TABS tablet [id number] No Take 5 mg by mouth 2 (two) times daily. [provider] Unknown Active Self, Pharmacy Records           Med Note Vonita Moss   Sat Apr 21, 2023  3:50 PM) No fill history found.  famotidine (PEPCID) 20 MG tablet [id number] Yes TAKE 1 TABLET AT BEDTIME  Patient taking differently: Take 20 mg by mouth daily as needed (breakthrough heartburn).   Thomas Form, MD Taking Active Self, Pharmacy Records  finasteride (PROSCAR) 5 MG tablet [id number] Yes TAKE 1 TABLET EVERY DAY,  PLEASE MAKE ANNUAL APPOINTMENT BEFORE NEXT REFILL  Patient taking differently: Take 5 mg by mouth at bedtime.   Shelby Mattocks, DO Taking Active Self, Pharmacy Records  hyoscyamine (LEVSIN SL) 0.125 MG SL tablet [id number] Yes DISSOLVE 1 TABLET UNDER THE TONGUE TWICE DAILY (NEED APPOINTMENT) AS NEEDED Nandigam, Eleonore Chiquito, MD Taking Active Self, Pharmacy Records  metoprolol succinate (TOPROL-XL) 25 MG 24 hr tablet [id number] Yes TAKE 1 TABLET EVERY DAY (STOP LOPRESSOR)  Patient taking differently: Take 25 mg by mouth at bedtime.   Thomas Ishikawa, MD Taking Active Self, Pharmacy Records  Multiple Vitamins-Minerals (CENTRUM SILVER 50+MEN) TABS [id number] Yes Take 1 tablet by mouth daily after breakfast. [provider] Taking Active Self  omeprazole (PRILOSEC) 40 MG capsule [id number] Yes TAKE 1 CAPSULE TWICE DAILY Nandigam, Eleonore Chiquito, MD Taking Active Self, Pharmacy Records  ondansetron (ZOFRAN) 4 MG tablet [id number] Yes TAKE 1 TABLET EVERY EIGHT HOURS AS NEEDED FOR NAUSEA OR VOMITING. Thomas Form, MD Taking Active Self, Pharmacy Records  propafenone [facility]) 225 MG tablet [id number] Yes TAKE 1 TABLET EVERY 8 HOURS Thomas Ishikawa, MD Taking Active Self, Pharmacy Records  tamsulosin [facility]) 0.4 MG CAPS capsule [id number] Yes TAKE 2 CAPSULES EVERY DAY  Patient taking differently: Take 0.4 mg by mouth 2 (two) times daily.   Shelby Mattocks, DO Taking Active Self, Pharmacy Records  traMADol (ULTRAM) 50 MG tablet [id number] Yes Take 1 tablet (50 mg total) by mouth 2 (two) times daily as needed for moderate pain. Needs appointment for further refills. Shelby Mattocks, DO Taking Active Self, Pharmacy Records  venlafaxine XR (EFFEXOR-XR) 150 MG 24 hr capsule [id number] Yes TAKE 1 CAPSULE EVERY  DAY WITH BREAKFAST Shelby Mattocks, DO Taking Active Self, Pharmacy Records           Med Note Vonita Moss   WGN Apr 21, 2023  3:52 PM) Pt states he takes every morning. Per dispense  report, LF 10/30/2022 #90, 90 DS.            Home Care and Equipment/Supplies: Were Home Health Services Ordered?: No Any new equipment or medical supplies ordered?: No  Functional Questionnaire: Do you need assistance with bathing/showering or dressing?: No Do you need assistance with meal preparation?: No Do you need assistance with eating?: No Do you have difficulty maintaining continence: No Do you need assistance with getting out of bed/getting out of a chair/moving?: No Do you have difficulty managing or taking your medications?: No  Follow up appointments reviewed: PCP Follow-up appointment confirmed?: NA Specialist Hospital Follow-up appointment confirmed?: Yes Date of Specialist follow-up appointment?: 04/30/23 Follow-Up Specialty Provider:: [facility] (Gastroenterology) Do you need transportation to your follow-up appointment?: No Do you understand care options if your condition(s) worsen?: Yes-patient verbalized understanding  SDOH Interventions Today    Flowsheet Row Most Recent Value  SDOH Interventions   Food Insecurity Interventions Intervention Not Indicated  Housing Interventions Intervention Not Indicated  Transportation Interventions Intervention Not Indicated  Utilities Interventions Intervention Not Indicated      Jodelle Gross RN, BSN, CCM RN Care Manager  Transitions of Care  [facility]  [phone]

## 2023-04-30 ENCOUNTER — Encounter: Payer: Self-pay | Admitting: Physician Assistant

## 2023-04-30 ENCOUNTER — Ambulatory Visit: Payer: Medicare HMO | Admitting: Physician Assistant

## 2023-04-30 VITALS — BP 98/60 | HR 84 | Ht 72.0 in | Wt 207.2 lb

## 2023-04-30 DIAGNOSIS — K219 Gastro-esophageal reflux disease without esophagitis: Secondary | ICD-10-CM

## 2023-04-30 DIAGNOSIS — K449 Diaphragmatic hernia without obstruction or gangrene: Secondary | ICD-10-CM

## 2023-04-30 DIAGNOSIS — Z8719 Personal history of other diseases of the digestive system: Secondary | ICD-10-CM | POA: Diagnosis not present

## 2023-04-30 NOTE — Progress Notes (Signed)
Subjective:    Patient ID: Thomas Dominguez, male    DOB: 1945-11-21, 77 y.o.   MRN: [id number]  HPI Thomas Dominguez is a 77 year old white male, established with Dr. Lavon Paganini who comes in today for follow-up.  He was last seen in the office in 4/ 2023.  Patient has a somewhat complicated GI history with remote history of distal esophagectomy and gastric pull- through 4 history of intramucosal adenocarcinoma.  This was done remotely/T97 at [facility]. 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 He has had multiple recurrences of small bowel obstructions since then. Also with known pandiverticulosis colonoscopy in 2019 Dr. Aloha Gell, and EGD in 2012 per Dr. Aloha Gell showed gastric food retention obscuring the pylorus.  He has had recurrence of the very large hiatal hernia and more recent imaging has shown most of his stomach and part of the transverse colon to be in his chest.  He is not felt to be a surgical candidate at this time due to his other comorbidities and extent of prior surgeries.  He also carries diagnosis of postsurgical gastroparesis and underwent a gastric POE M in August 2024 at [facility].  He has had follow-up there and is felt to be doing well.  He had good symptom relief after this procedure which has persisted.  He was just hospitalized last week with another partial small bowel obstruction, he was cared for by surgery, did not see GI during that admission, he did require NG decompression, and resolved with conservative management.  He says that episode came on very abruptly late 1 evening.  He had had a bowel movement at about 8 PM and then a couple of hours later developed acute severe pain consistent with his previous obstructions. CT of the abdomen pelvis on 04/21/2023 showed a very large hiatal hernia containing most of the stomach, part of the transverse colon most of the pancreas and splenic vein all well up into the chest.  There were multiple mild to  moderately dilated loops of small bowel with a transition point in the left abdomen.  He is very good about managing his diet in setting of all of his GI issues, tends to eat smaller meals, he is trying to only eat soluble fibers, eats his meats chopped, and usually takes 1 dose of MiraLAX daily.  If he does not have a bowel movement for a day then he will increase to 2 doses of MiraLAX daily.  He has noticed that he is able to walk further without getting winded when he has not eaten much and that if his stomach is full he will tend to have dyspnea with exertion.  He is generally on omeprazole 40 mg twice daily, uses famotidine 20 mg at bedtime as needed, Levsin sublingual as needed and Zofran 4 mg every 6 hours as needed for nausea.  He does have a prescription for tramadol for arthritic type symptoms but uses this sparingly.  Review of Systems Pertinent positive and negative review of systems were noted in the above HPI section.  All other review of systems was otherwise negative.   Outpatient Encounter Medications as of 04/30/2023  Medication Sig   acetaminophen (TYLENOL) 500 MG tablet Take 500-1,000 mg by mouth daily as needed for moderate pain (pain score 4-6), fever or headache.   apixaban (ELIQUIS) 5 MG TABS tablet Take 5 mg by mouth 2 (two) times daily.   famotidine (PEPCID) 20 MG tablet TAKE 1 TABLET AT BEDTIME (  Patient taking differently: Take 20 mg by mouth daily as needed (breakthrough heartburn).)   finasteride (PROSCAR) 5 MG tablet TAKE 1 TABLET EVERY DAY, PLEASE MAKE ANNUAL APPOINTMENT BEFORE NEXT REFILL (Patient taking differently: Take 5 mg by mouth at bedtime.)   hyoscyamine (LEVSIN SL) 0.125 MG SL tablet DISSOLVE 1 TABLET UNDER THE TONGUE TWICE DAILY (NEED APPOINTMENT) AS NEEDED   metoprolol succinate (TOPROL-XL) 25 MG 24 hr tablet TAKE 1 TABLET EVERY DAY (STOP LOPRESSOR) (Patient taking differently: Take 25 mg by mouth at bedtime.)   Multiple Vitamins-Minerals (CENTRUM SILVER  50+MEN) TABS Take 1 tablet by mouth daily after breakfast.   omeprazole (PRILOSEC) 40 MG capsule TAKE 1 CAPSULE TWICE DAILY   ondansetron (ZOFRAN) 4 MG tablet TAKE 1 TABLET EVERY EIGHT HOURS AS NEEDED FOR NAUSEA OR VOMITING.   propafenone (RYTHMOL) 225 MG tablet TAKE 1 TABLET EVERY 8 HOURS   tamsulosin (FLOMAX) 0.4 MG CAPS capsule TAKE 2 CAPSULES EVERY DAY (Patient taking differently: Take 0.4 mg by mouth 2 (two) times daily.)   traMADol (ULTRAM) 50 MG tablet Take 1 tablet (50 mg total) by mouth 2 (two) times daily as needed for moderate pain. Needs appointment for further refills.   venlafaxine XR (EFFEXOR-XR) 150 MG 24 hr capsule TAKE 1 CAPSULE EVERY DAY WITH BREAKFAST   No facility-administered encounter medications on file as of 04/30/2023.   Allergies  Allergen Reactions   Mobic [Meloxicam] Shortness Of Breath and Other (See Comments)    Myalgias   Nsaids Other (See Comments)    Pt said he was told to avoid due to stomach issues   Statins Other (See Comments)    Myalgias   Patient Active Problem List   Diagnosis Date Noted   SBO (small bowel obstruction) (HCC) 04/21/2023   Aortic atherosclerosis (HCC) 11/24/2021   Mood disorder (HCC) 05/06/2021   Aspiration, chronic pulmonary, subsequent encounter 08/26/2020   Hyperlipidemia 09/05/2019   BPH (benign prostatic hyperplasia) 01/17/2019   Degenerative disc disease, lumbar 01/17/2019   Chronic pain syndrome 10/06/2013   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   Unspecified atrial fibrillation (HCC) 04/13/2010   GERD (gastroesophageal reflux disease) 04/13/2010   Social History   Socioeconomic History   Marital status: Married    Spouse name: Britta Mccreedy   Number of children: 2   Years of education: 16   Highest education level: Bachelor's degree (e.g., BA, AB, BS)  Occupational History   Occupation: retired    Associate Professor: [employer]  Tobacco Use   Smoking status: Never   Smokeless tobacco: Never  Vaping Use   Vaping status: Never  Used  Substance and Sexual Activity   Alcohol use: Not Currently    Comment: Stopped a year ago   Drug use: Never   Sexual activity: Yes    Birth control/protection: None  Other Topics Concern   Not on file  Social History Narrative   Patient lives with his wife in [place].    Patient has one son in [place] and one daughter in [place].    Patient walks daily at local parks/trails around [place].    Patient has one dog.    Social Determinants of Health   Financial Resource Strain: Low Risk  (11/04/2022)   Overall Financial Resource Strain (CARDIA)    Difficulty of Paying Living Expenses: Not hard at all  Food Insecurity: No Food Insecurity (04/25/2023)   Hunger Vital Sign    Worried About Running Out of Food in the Last Year: Never true    Ran Out  of Food in the Last Year: Never true  Transportation Needs: No Transportation Needs (04/25/2023)   PRAPARE - Administrator, Civil Service (Medical): No    Lack of Transportation (Non-Medical): No  Physical Activity: Sufficiently Active (11/04/2022)   Exercise Vital Sign    Days of Exercise per Week: 5 days    Minutes of Exercise per Session: 60 min  Stress: No Stress Concern Present (11/04/2022)   [employer] of Occupational Health - Occupational Stress Questionnaire    Feeling of Stress : Not at all  Social Connections: Socially Integrated (11/04/2022)   Social Connection and Isolation Panel [NHANES]    Frequency of Communication with Friends and Family: More than three times a week    Frequency of Social Gatherings with Friends and Family: More than three times a week    Attends Religious Services: More than 4 times per year    Active Member of [employer] or Organizations: Yes    Attends Banker Meetings: More than 4 times per year    Marital Status: Married  Catering manager Violence: Not At Risk (04/21/2023)   Humiliation, Afraid, Rape, and Kick questionnaire    Fear of Current or Ex-Partner: No     Emotionally Abused: No    Physically Abused: No    Sexually Abused: No    Mr. Demelo's family history includes Cancer in his mother; Emphysema in his father; GER disease in his daughter, mother, and son; Lung cancer in his sister.      Objective:    Vitals:   04/30/23 1057  BP: 98/60  Pulse: 84    Physical Exam. Well-developed well-nourished elderly WM  in no acute distress.  Height, Weight,207  BMI 28.1  HEENT; nontraumatic normocephalic, EOMI, PE R LA, sclera anicteric. Oropharynx;not done today  Neck; supple, no JVD Cardiovascular; regular rate and rhythm with S1-S2, no murmur rub or gallop Pulmonary; Clear bilaterally Abdomen; soft, nontender, nondistended, large midline incisional scar ,no palpable mass or hepatosplenomegaly, bowel sounds are active Rectal; not done today Skin; benign exam, no jaundice rash or appreciable lesions Extremities; no clubbing cyanosis or edema skin warm and dry Neuro/Psych; alert and oriented x4, grossly nonfocal mood and affect appropriate        Assessment & Plan:   #84 77 year old white male with history of recurrent small bowel obstructions, patient was admitted about a week ago with partial small bowel obstruction which resolved with NG decompression and conservative management. He is asymptomatic today, and is well familiar with management of his recurrent obstructions  #2 status post gastric POEM August 2024 for postoperative gastroparesis with significant improvement in symptoms.- [facility]  # 3 status post remote distal esophagectomy and gastric pull-through 1997 for early esophageal adenocarcinoma- [facility]   #4 status post repair of very large paraesophageal hernia 2013-[facility] Has developed recurrence of very large hernia with most recent imaging showing most of the stomach, transverse colon, pancreas and splenic vein all well up into the chest cavity  #5 GERD stable #6 pandiverticulosis #7 atrial fibrillation #8.  History of aspiration  pneumonia #9 BPH  Plan; continue MiraLAX 17 g in 8 ounces water daily, titrate as needed to achieve a bowel movement at least every other day Continue omeprazole 40 mg p.o. twice daily refill x 1 year Continue famotidine 20 mg nightly as needed refill x 1 year Continue Levsin sublingual as needed-using infrequently refill Continue Zofran 4 mg every 6 hours as needed refill  Continue small meals with chopped  meats, soluble fibers, n.p.o. for at least 3 hours prior to bedtime and continue elevation of the head of the bed at least 45 degrees for sleep or naps  Will follow-up with Dr. Lavon Paganini in 4 months or sooner as needed   Yolinda Duerr Oswald Hillock PA-C 04/30/2023   Cc: Shelby Mattocks, DO

## 2023-04-30 NOTE — Patient Instructions (Addendum)
Continue your current regimen.   _______________________________________________________  If your blood pressure at your visit was 140/90 or greater, please contact your primary care physician to follow up on this.  _______________________________________________________  If you are age 77 or older, your body mass index should be between 23-30. Your Body mass index is 28.11 kg/m. If this is out of the aforementioned range listed, please consider follow up with your Primary Care Provider.  If you are age 60 or younger, your body mass index should be between 19-25. Your Body mass index is 28.11 kg/m. If this is out of the aformentioned range listed, please consider follow up with your Primary Care Provider.   ________________________________________________________  The [place] [facility] providers would like to encourage you to use [facility] to communicate with providers for non-urgent requests or questions.  Due to long hold times on the telephone, sending your provider a message by [facility] may be a faster and more efficient way to get a response.  Please allow 48 business hours for a response.  Please remember that this is for non-urgent requests.  _______________________________________________________   If better you can cancel your February appointment.  I appreciate the opportunity to care for you. Amy Esterwood PA-C

## 2023-05-02 ENCOUNTER — Ambulatory Visit
Payer: Medicare HMO | Attending: Cardiovascular Disease | Admitting: Pharmacist Clinician (PhC)/ Clinical Pharmacy Specialist

## 2023-05-02 ENCOUNTER — Encounter: Payer: Self-pay | Admitting: Pharmacist Clinician (PhC)/ Clinical Pharmacy Specialist

## 2023-05-02 DIAGNOSIS — E785 Hyperlipidemia, unspecified: Secondary | ICD-10-CM | POA: Diagnosis not present

## 2023-05-02 MED ORDER — EZETIMIBE 10 MG PO TABS: 10.0000 mg | ORAL_TABLET | Freq: Every day | ORAL | 3 refills | Status: DC

## 2023-05-02 NOTE — Patient Instructions (Signed)
Your Results:             Your most recent labs Goal  Total Cholesterol 203 < 200  Triglycerides 252 < 150  HDL (happy/good cholesterol) 58 > 40  LDL (lousy/bad cholesterol 102 < 70   Medication changes:  Start ezetimibe  10 mg once daily to help with cholesterol management.    Lab orders:  We want to repeat labs after 2-3 months.  We will send you a lab order to remind you once we get closer to that time.    Thank you for choosing [facility]

## 2023-05-02 NOTE — Progress Notes (Unsigned)
Office Visit    Patient Name: Thomas Dominguez. Cosby Date of Encounter: 05/02/2023  Primary Care Provider:  Shelby Mattocks, DO Primary Cardiologist:  Parke Poisson, MD  Chief Complaint    Hyperlipidemia   Significant Past Medical History   AF CHADS2-VASc =  3, on apixaban, propafenone metoprolol  Esophageal cancer Post esophagectomy  CHF HFpEF - grade 2 diastolic dysfunction  BPH On tamsulosin, finasteride        Allergies  Allergen Reactions   Mobic [Meloxicam] Shortness Of Breath and Other (See Comments)    Myalgias   Nsaids Other (See Comments)    Pt said he was told to avoid due to stomach issues   Statins Other (See Comments)    Myalgias    History of Present Illness    Thomas Dominguez is a 77 y.o. male patient of Dr Jacques Navy, in the office today to discuss options for cholesterol management.  His most recent labs were drawn in August.  He notes that the labs were drawn after he had spent a few days on a liquid diet because of ongoing problems with small bowel obstructions.   He notes that this diet usually includes lots of milkshakes and puddings, less healthy options that he normally would not eat.    In February he had a calcium screen done, score was 0.    Insurance Carrier:  [employer]  [id number]  $47/month   LDL Cholesterol goal:  LDL < 100  Current Medications:  none  Previously tried:  atorvastatin, pravastatin, rosuvastatin - myalgias   Family Hx: sister is 82 had MI, still living, other sister with splenic aneurysm, mother lived to 76    Social Hx: Tobacco: no Alcohol: occasional less than  Diet:    eats mostly soluble fiber 2/2 chronic bowel obstruction problems; uses whole grains, oat bread or oatmeal, oat cereals protein is chicken, salmon;  baked sweet potatoes, can eat some fruits, but has to be careful, no apple skins, has to chopped vegetables, cooked not raw   Accessory Clinical Findings   Lab Results  Component Value Date   CHOL  203 (H) 02/20/2023   HDL 58 02/20/2023   LDLCALC 102 (H) 02/20/2023   TRIG 252 (H) 02/20/2023   CHOLHDL 3.5 02/20/2023    No results found for: "LIPOA"  Lab Results  Component Value Date   ALT 18 04/22/2023   AST 17 04/22/2023   ALKPHOS 73 04/22/2023   BILITOT 0.5 04/22/2023   Lab Results  Component Value Date   CREATININE 0.95 04/24/2023   BUN 11 04/24/2023   NA 137 04/24/2023   K 3.6 04/24/2023   CL 103 04/24/2023   CO2 23 04/24/2023   No results found for: "HGBA1C"  Home Medications    Current Outpatient Medications  Medication Sig Dispense Refill   ezetimibe (ZETIA) 10 MG tablet Take 1 tablet (10 mg total) by mouth daily. 90 tablet 3   acetaminophen (TYLENOL) 500 MG tablet Take 500-1,000 mg by mouth daily as needed for moderate pain (pain score 4-6), fever or headache.     apixaban (ELIQUIS) 5 MG TABS tablet Take 5 mg by mouth 2 (two) times daily.     famotidine (PEPCID) 20 MG tablet TAKE 1 TABLET AT BEDTIME (Patient taking differently: Take 20 mg by mouth daily as needed (breakthrough heartburn).) 90 tablet 3   finasteride (PROSCAR) 5 MG tablet TAKE 1 TABLET EVERY DAY, PLEASE MAKE ANNUAL APPOINTMENT BEFORE NEXT REFILL (Patient taking  differently: Take 5 mg by mouth at bedtime.) 30 tablet 11   hyoscyamine (LEVSIN SL) 0.125 MG SL tablet DISSOLVE 1 TABLET UNDER THE TONGUE TWICE DAILY (NEED APPOINTMENT) AS NEEDED 120 tablet 5   metoprolol succinate (TOPROL-XL) 25 MG 24 hr tablet TAKE 1 TABLET EVERY DAY (STOP LOPRESSOR) (Patient taking differently: Take 25 mg by mouth at bedtime.) 90 tablet 2   Multiple Vitamins-Minerals (CENTRUM SILVER 50+MEN) TABS Take 1 tablet by mouth daily after breakfast.     omeprazole (PRILOSEC) 40 MG capsule TAKE 1 CAPSULE TWICE DAILY 180 capsule 3   ondansetron (ZOFRAN) 4 MG tablet TAKE 1 TABLET EVERY EIGHT HOURS AS NEEDED FOR NAUSEA OR VOMITING. 30 tablet 3   propafenone (RYTHMOL) 225 MG tablet TAKE 1 TABLET EVERY 8 HOURS 270 tablet 3   tamsulosin  (FLOMAX) 0.4 MG CAPS capsule TAKE 2 CAPSULES EVERY DAY (Patient taking differently: Take 0.4 mg by mouth 2 (two) times daily.) 180 capsule 3   traMADol (ULTRAM) 50 MG tablet Take 1 tablet (50 mg total) by mouth 2 (two) times daily as needed for moderate pain. Needs appointment for further refills. 60 tablet 0   venlafaxine XR (EFFEXOR-XR) 150 MG 24 hr capsule TAKE 1 CAPSULE EVERY DAY WITH BREAKFAST 90 capsule 3   No current facility-administered medications for this visit.     Assessment & Plan    Hyperlipidemia Assessment: Patient not at LDL goal of < 100 Triglycerides not at goal of < 150 Most recent LDL 102, TG 252 on 02/20/23 Not able to tolerate statins secondary to myalgias Reviewed options for lowering LDL cholesterol, including ezetimibe, PCSK-9 inhibitors, bempedoic acid and inclisiran.  Discussed mechanisms of action, dosing, side effects, potential decreases in LDL cholesterol and costs.  Also reviewed potential options for patient assistance.  Plan: Patient agreeable to starting ezetimibe 10 mg daily Would like to continue with current dietary restrictions to see if TG will improve, Repeat labs after:  3 months Lipid Liver function    Phillips Hay, PharmD CPP [facility] [address]  [place], [place] [address] [phone]  05/02/2023, 7:11 PM

## 2023-05-02 NOTE — Assessment & Plan Note (Signed)
Assessment: Patient not at LDL goal of < 100 Triglycerides not at goal of < 150 Most recent LDL 102, TG 252 on 02/20/23 Not able to tolerate statins secondary to myalgias Reviewed options for lowering LDL cholesterol, including ezetimibe, PCSK-9 inhibitors, bempedoic acid and inclisiran.  Discussed mechanisms of action, dosing, side effects, potential decreases in LDL cholesterol and costs.  Also reviewed potential options for patient assistance.  Plan: Patient agreeable to starting ezetimibe 10 mg daily Would like to continue with current dietary restrictions to see if TG will improve, Repeat labs after:  3 months Lipid Liver function

## 2023-05-03 DIAGNOSIS — N401 Enlarged prostate with lower urinary tract symptoms: Secondary | ICD-10-CM | POA: Diagnosis not present

## 2023-05-03 DIAGNOSIS — R351 Nocturia: Secondary | ICD-10-CM | POA: Diagnosis not present

## 2023-05-03 DIAGNOSIS — N5201 Erectile dysfunction due to arterial insufficiency: Secondary | ICD-10-CM | POA: Diagnosis not present

## 2023-05-03 DIAGNOSIS — R3912 Poor urinary stream: Secondary | ICD-10-CM | POA: Diagnosis not present

## 2023-05-03 DIAGNOSIS — N430 Encysted hydrocele: Secondary | ICD-10-CM | POA: Diagnosis not present

## 2023-05-14 ENCOUNTER — Other Ambulatory Visit: Payer: Self-pay | Admitting: Student

## 2023-05-14 DIAGNOSIS — M51369 Other intervertebral disc degeneration, lumbar region without mention of lumbar back pain or lower extremity pain: Secondary | ICD-10-CM

## 2023-05-16 ENCOUNTER — Other Ambulatory Visit: Payer: Self-pay | Admitting: Gastroenterology

## 2023-05-29 DIAGNOSIS — M47816 Spondylosis without myelopathy or radiculopathy, lumbar region: Secondary | ICD-10-CM | POA: Diagnosis not present

## 2023-05-29 DIAGNOSIS — Z6827 Body mass index (BMI) 27.0-27.9, adult: Secondary | ICD-10-CM | POA: Diagnosis not present

## 2023-06-05 DIAGNOSIS — R3912 Poor urinary stream: Secondary | ICD-10-CM | POA: Diagnosis not present

## 2023-06-05 DIAGNOSIS — R3915 Urgency of urination: Secondary | ICD-10-CM | POA: Diagnosis not present

## 2023-06-12 DIAGNOSIS — N401 Enlarged prostate with lower urinary tract symptoms: Secondary | ICD-10-CM | POA: Diagnosis not present

## 2023-06-12 DIAGNOSIS — R3915 Urgency of urination: Secondary | ICD-10-CM | POA: Diagnosis not present

## 2023-06-12 DIAGNOSIS — R3912 Poor urinary stream: Secondary | ICD-10-CM | POA: Diagnosis not present

## 2023-06-12 DIAGNOSIS — R351 Nocturia: Secondary | ICD-10-CM | POA: Diagnosis not present

## 2023-07-12 DIAGNOSIS — M47816 Spondylosis without myelopathy or radiculopathy, lumbar region: Secondary | ICD-10-CM | POA: Diagnosis not present

## 2023-07-12 DIAGNOSIS — M47815 Spondylosis without myelopathy or radiculopathy, thoracolumbar region: Secondary | ICD-10-CM | POA: Diagnosis not present

## 2023-07-26 DIAGNOSIS — N401 Enlarged prostate with lower urinary tract symptoms: Secondary | ICD-10-CM | POA: Diagnosis not present

## 2023-07-26 DIAGNOSIS — L905 Scar conditions and fibrosis of skin: Secondary | ICD-10-CM | POA: Diagnosis not present

## 2023-07-26 DIAGNOSIS — Z85820 Personal history of malignant melanoma of skin: Secondary | ICD-10-CM | POA: Diagnosis not present

## 2023-07-26 DIAGNOSIS — D692 Other nonthrombocytopenic purpura: Secondary | ICD-10-CM | POA: Diagnosis not present

## 2023-07-26 DIAGNOSIS — R3912 Poor urinary stream: Secondary | ICD-10-CM | POA: Diagnosis not present

## 2023-07-26 DIAGNOSIS — R351 Nocturia: Secondary | ICD-10-CM | POA: Diagnosis not present

## 2023-07-26 DIAGNOSIS — L570 Actinic keratosis: Secondary | ICD-10-CM | POA: Diagnosis not present

## 2023-07-26 DIAGNOSIS — D225 Melanocytic nevi of trunk: Secondary | ICD-10-CM | POA: Diagnosis not present

## 2023-07-26 DIAGNOSIS — R3915 Urgency of urination: Secondary | ICD-10-CM | POA: Diagnosis not present

## 2023-07-26 DIAGNOSIS — L814 Other melanin hyperpigmentation: Secondary | ICD-10-CM | POA: Diagnosis not present

## 2023-07-26 DIAGNOSIS — L821 Other seborrheic keratosis: Secondary | ICD-10-CM | POA: Diagnosis not present

## 2023-07-27 ENCOUNTER — Telehealth: Payer: Self-pay | Admitting: Internal Medicine

## 2023-07-27 NOTE — Telephone Encounter (Signed)
   Pre-operative Risk Assessment    Patient Name: Thomas Dominguez  DOB: [birth year] MRN: [id number]   Date of last office visit: 01/31/23 Date of next office visit: 10/08/23   Request for Surgical Clearance    Procedure:   TURP   Date of Surgery:  Clearance 08/29/23                                Surgeon:  Dr. Modena Slater Surgeon's Group or Practice Name:  [facility]  Phone number:  [phone]  Fax number:  [phone]   Type of Clearance Requested:   - Pharmacy:  Hold Apixaban (Eliquis)     Type of Anesthesia:  General    Additional requests/questions:   Caller Bjorn Loser) stated patient needs clearance to hold his Eliquis.  Signed, Annetta Maw   07/27/2023, 9:57 AM

## 2023-07-28 ENCOUNTER — Other Ambulatory Visit: Payer: Self-pay | Admitting: Gastroenterology

## 2023-07-28 NOTE — Telephone Encounter (Signed)
Patient with diagnosis of atrial fibrillation on Eliquis for anticoagulation.    Procedure:   TURP    Date of Surgery:  Clearance 08/29/23   CHA2DS2-VASc Score = 3   This indicates a 3.2% annual risk of stroke. The patient's score is based upon: CHF History: 1 HTN History: 0 Diabetes History: 0 Stroke History: 0 Vascular Disease History: 0 Age Score: 2 Gender Score: 0    CrCl 75 Platelet count 177  Per office protocol, patient can hold Eliquis for 2 days prior to procedure.   Patient will not need bridging with Lovenox (enoxaparin) around procedure.  **This guidance is not considered finalized until pre-operative APP has relayed final recommendations.**

## 2023-07-30 NOTE — Telephone Encounter (Signed)
     Primary Cardiologist: Parke Poisson, MD  Clinical pharmacist have reviewed patient's surgical request and provided the following recommendations for , Thomas Dominguez   Patient with diagnosis of atrial fibrillation on Eliquis for anticoagulation.     Procedure:   TURP    Date of Surgery:  Clearance 08/29/23     CHA2DS2-VASc Score = 3   This indicates a 3.2% annual risk of stroke. The patient's score is based upon: CHF History: 1 HTN History: 0 Diabetes History: 0 Stroke History: 0 Vascular Disease History: 0 Age Score: 2 Gender Score: 0     CrCl 75 Platelet count 177   Per office protocol, patient can hold Eliquis for 2 days prior to procedure.   Patient will not need bridging with Lovenox (enoxaparin) around procedure.   I will route this recommendation to the requesting party via Epic fax function and remove from pre-op pool.  [name] NP-C     07/30/2023, 10:19 AM [facility] [address] Office [phone] Fax [phone]

## 2023-08-13 ENCOUNTER — Other Ambulatory Visit: Payer: Self-pay | Admitting: Student

## 2023-08-13 DIAGNOSIS — M51369 Other intervertebral disc degeneration, lumbar region without mention of lumbar back pain or lower extremity pain: Secondary | ICD-10-CM

## 2023-08-18 ENCOUNTER — Other Ambulatory Visit: Payer: Self-pay | Admitting: Cardiology

## 2023-08-21 ENCOUNTER — Telehealth: Payer: Self-pay | Admitting: Internal Medicine

## 2023-08-21 NOTE — Telephone Encounter (Signed)
 Patient stopped in this afternoon and dropped off patient assistance forms In providers box

## 2023-08-22 ENCOUNTER — Encounter: Payer: Self-pay | Admitting: Gastroenterology

## 2023-08-22 ENCOUNTER — Ambulatory Visit: Payer: Medicare HMO | Admitting: Gastroenterology

## 2023-08-22 VITALS — BP 104/70 | Ht 72.0 in | Wt 211.0 lb

## 2023-08-22 DIAGNOSIS — R110 Nausea: Secondary | ICD-10-CM | POA: Diagnosis not present

## 2023-08-22 DIAGNOSIS — R109 Unspecified abdominal pain: Secondary | ICD-10-CM | POA: Diagnosis not present

## 2023-08-22 DIAGNOSIS — Z8719 Personal history of other diseases of the digestive system: Secondary | ICD-10-CM | POA: Diagnosis not present

## 2023-08-22 DIAGNOSIS — K2100 Gastro-esophageal reflux disease with esophagitis, without bleeding: Secondary | ICD-10-CM | POA: Diagnosis not present

## 2023-08-22 NOTE — Progress Notes (Signed)
 Thomas Dominguez    [id number]    [birth year]  Primary Care Physician:Dominguez, Thomas Donning, Dominguez  Referring Physician: Shelby Mattocks, Dominguez [address] [place],  [place] [address]   Chief complaint:  h/o esophageal cancer, barrett's esophagus  Discussed the use of AI scribe software for clinical note transcription with the patient, who gave verbal consent to proceed.  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  He is here for a follow-up on his gastrointestinal issues, particularly focusing on swallowing and bowel movements. Swallowing has improved since his surgery G-POEM in August 2024, and he no longer experiences regurgitation. His stomach is emptying sooner, which he describes as 'probably what used to be normal for me'. He has one to two bowel movements per day and uses Metamucil and Benefiber to help regulate them.  He has a history of Barrett's esophagus , last EGD with biopsies in 2021, last EGD during G-POEM but biopsies were not obtained.  He experiences intestinal cramping and uses hyoscyamine as needed, approximately twice a month, to manage these symptoms. He is cautious with its use due to concerns about constipation and potential bowel obstructions. He also takes Zofran for nausea about four times a week, which he finds effective in managing his symptoms.  He has a family history of gastrointestinal issues, with both of his older sisters and his mother having similar problems. He recalls his mother using peppermint for relief, which he also finds helpful.        CT abdomen pelvis with contrast January 31, 2021 1. Interval resolution of small bowel dilatation. No current evidence of bowel  obstruction. 2. Colonic diverticulosis with unchanged sigmoid colon wall thickening, possibly chronic and related to previous inflammation. No definite evidence of acute diverticulitis. 3. Unchanged large hiatal hernia containing a short segment of the transverse colon and portions of the pancreas and retroperitoneal vasculature. 4. Diffuse bladder wall thickening, increased from the prior CT. Correlate for cystitis. 5. Unchanged peribronchovascular consolidation in the lung bases with differential considerations as previously detailed.     CT chest March 07, 2021 Peribronchovascular consolidations and ground-glass nodular opacities are unchanged in appearance compared to prior exam and likely sequela of aspiration. Areas of low attenuation are seen within the consolidations, suggesting a component of lipoid pneumonia. Status post esophagectomy with gastric pull-through.   CT cardiac coronary March 12, 2020: Cardiac coronary calcium score 0   CT chest high-resolution March 12, 2020: Widespread areas of thickening of peribronchovascular interstitium with architectural distortion including nodular and masslike opacities, surrounding groundglass attenuation and micronodularity stable compared to March 2021.    CT abdomen pelvis Nov 20, 2019: Mid small bowel obstruction likely due to adhesions.  Airspace disease in bilateral lungs worrisome for aspiration   04/02/2019: LVEF 55-60%   Colonoscopy 10/03/2017 Dr. Aloha Dominguez (f/u diverticulitis)- severe pancolonic diverticulosis, tortuosity & spasm  EGD 03/2015 Dr. Aloha Dominguez- changes of prior esophagectomy and gastric pull-through, normal mucosa throughout  EGD 03/2011 Dr. Aloha Dominguez- surgically shortened anastomosis at 21 cm, no Barrett's changes seen. Fair amount of solid food retention. Gastric food retention obscuring pylorus. Very angulated atrium without stenosis, probably contributing to emptying difficulties. Because of coughing  difficulties did not pursue duodenal intubation  EGD  10/2010 Dr. Aloha Dominguez- gastric food retention obscuring pylorus  CLN 09/12/2012 Dr. Aloha Dominguez- pancolonic diverticulosis, less than ideal prep, internal hemorrhoids  CLN 2004- severe left-sided diverticulosis  RECENT GI IMAGING STUDIES:  AXR 05/2018- no free air identified, no dilated bowel loops  CTAP 05/2018 SMALL BOWEL OBSTRUCTION. TRANSITION POINT IS NEAR A SMALL BOWEL ANASTOMOSIS IN THE DISTAL SMALL BOWEL CENTRAL ABDOMEN IN THE REGION OF THE CENTRAL MESENTERY. EXACT TRANSITION POINT IS DIFFICULT TO IDENTIFY. EFFUSIONS CANNOT BE EXCLUDED. NO DEFINITIVE FOCAL MASS. NO ADDITIONAL SIGNIFICANT CHANGE.  PET scan 07/2017 NO EVIDENCE OF HYPERMETABOLIC MALIGNANCY IN THE CHEST, ABDOMEN OR PELVIS. HYPERMETABOLIC ACTIVITY NEAR THE SMALL BOWEL ANASTOMOSIS HAS THE APPEARANCE OF PHYSIOLOGIC INTRALUMINAL BOWEL ACTIVITY WITH NO PERCEIVED MASS SEEN ON TODAY'S EXAM  PREVIOUS ABDOMINAL SURGERIES: Ventral hernia repair, paraesophageal hernia repair (2013), esophagectomy (1997)  CANCER HISTORY: Esophageal cancer s/p esophagectomy 1997 at [facility]. No family history of colon cancer.   Barium swallow 3/5/98l Post op anastomotic stricture s/p esophagogastrectomy. 13mm barium tablet did not traverse.     Outpatient Encounter Medications as of 08/22/2023  Medication Sig   acetaminophen (TYLENOL) 500 MG tablet Take 500-1,000 mg by mouth daily as needed for moderate pain (pain score 4-6), fever or headache.   apixaban (ELIQUIS) 5 MG TABS tablet Take 5 mg by mouth 2 (two) times daily.   ezetimibe (ZETIA) 10 MG tablet Take 1 tablet (10 mg total) by mouth daily.   famotidine (PEPCID) 20 MG tablet TAKE 1 TABLET AT BEDTIME   hyoscyamine (LEVSIN SL) 0.125 MG SL tablet DISSOLVE 1 TABLET UNDER THE TONGUE TWICE DAILY (NEED APPOINTMENT) AS NEEDED   metoprolol succinate (TOPROL-XL) 25 MG 24 hr tablet TAKE 1 TABLET EVERY DAY (STOP LOPRESSOR)   Multiple Vitamins-Minerals  (CENTRUM SILVER 50+MEN) TABS Take 1 tablet by mouth daily after breakfast.   omeprazole (PRILOSEC) 40 MG capsule TAKE 1 CAPSULE TWICE DAILY   ondansetron (ZOFRAN) 4 MG tablet TAKE 1 TABLET EVERY EIGHT HOURS AS NEEDED FOR NAUSEA OR VOMITING.   propafenone (RYTHMOL) 225 MG tablet TAKE 1 TABLET EVERY 8 HOURS   tamsulosin (FLOMAX) 0.4 MG CAPS capsule TAKE 2 CAPSULES EVERY DAY (Patient taking differently: Take 0.4 mg by mouth 2 (two) times daily.)   traMADol (ULTRAM) 50 MG tablet Take 1 tablet (50 mg total) by mouth 2 (two) times daily as needed for moderate pain (pain score 4-6).   venlafaxine XR (EFFEXOR-XR) 150 MG 24 hr capsule TAKE 1 CAPSULE EVERY DAY WITH BREAKFAST   [DISCONTINUED] finasteride (PROSCAR) 5 MG tablet TAKE 1 TABLET EVERY DAY, PLEASE MAKE ANNUAL APPOINTMENT BEFORE NEXT REFILL (Patient taking differently: Take 5 mg by mouth at bedtime.)   No facility-administered encounter medications on file as of 08/22/2023.    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/17/2019   Displacement of lumbar intervertebral disc without myelopathy 04/22/2010   Diverticulosis 01/17/2019   Dysrhythmia    Dysrhythmias 12/19/2011   Esophageal cancer (HCC)    Gastroparesis    GERD (  gastroesophageal reflux disease)    H/O small bowel obstruction 01/17/2019   Hiatal hernia 01/17/2019   History of esophagectomy 90[facility] at [facility] for severe Barrett's with  microfocus of cancer 11/22/2019   Hyperlipidemia 2020   Hypertension    ILD (interstitial lung disease) (HCC)    Inguinal hernia    bilateral, left more prominant   Left inguinal hernia 03/21/2021   Leg swelling 01/17/2019   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HCC)    Melanoma (HCC) 07/2020   Dr Karlyn Agee   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   Pneumonia 11/2019   Pneumothorax 04/08/2020   Sacroiliitis (HCC)    SBO (small bowel obstruction) (HCC) 11/20/2019   Status post left inguinal hernia repair 04/15/2021   Wears glasses     Past Surgical History:  Procedure Laterality Date   ABDOMINAL HERNIA REPAIR  2012, 2014, 2017   ventral   BRONCHIAL BIOPSY  04/08/2020   Procedure: BRONCHIAL BIOPSIES;  Surgeon: Chilton Greathouse, MD;  Location: [facility];  Service: Cardiopulmonary;;   BRONCHIAL BRUSHINGS  04/08/2020   Procedure: BRONCHIAL BRUSHINGS;  Surgeon: Chilton Greathouse, MD;  Location: [facility];  Service: Cardiopulmonary;;   BRONCHIAL WASHINGS  04/08/2020   Procedure: BRONCHIAL WASHINGS;  Surgeon: Chilton Greathouse, MD;  Location: [facility];  Service: Cardiopulmonary;;   COLONOSCOPY  2019   ESOPHAGECTOMY  1997   for esophageal cancer   ESOPHAGOGASTRODUODENOSCOPY (EGD) WITH PROPOFOL N/A 06/11/2020   Procedure: ESOPHAGOGASTRODUODENOSCOPY (EGD) WITH PROPOFOL;  Surgeon: Napoleon Form, MD;  Location: [facility];  Service: Endoscopy;  Laterality: N/A;   EYE SURGERY  2\27\2024   Cataracts removed   HERNIA REPAIR  Ventral,incisional 5x & inquinal       plu5   Last 03/22/2021   INGUINAL HERNIA REPAIR Left 03/22/2021   Procedure: OPEN REPAIR LEFT INGUINAL HERNIA WITH MESH;  Surgeon: Darnell Level, MD;  Location: [facility];  Service: General;  Laterality: Left;   PARATHYROIDECTOMY Right 01/26/2020   Procedure: RIGHT INFERIOR PARATHYROIDECTOMY;  Surgeon: Darnell Level, MD;  Location: [facility];  Service: General;  Laterality: Right;   peroral endoscopic myotomy     at [facility]   SMALL INTESTINE  SURGERY  01/06/2012   SPINAL FUSION  2018, 2020   L2 L5 in 2018, L5-S1 2020   VIDEO BRONCHOSCOPY N/A 04/08/2020   Procedure: VIDEO BRONCHOSCOPY WITH FLUORO;  Surgeon: Chilton Greathouse, MD;  Location: [facility];  Service: Cardiopulmonary;  Laterality: N/A;    Family History  Problem Relation Age of Onset   GER disease Mother    Cancer Mother    Emphysema Father        smoker   Lung cancer Sister        never smoked   GER disease Daughter    GER disease Son    Colon cancer Neg Hx    Stomach cancer Neg Hx    Esophageal cancer Neg Hx     Social History   Socioeconomic History   Marital status: Married    Spouse name: Britta Mccreedy   Number of children: 2   Years of education: 16   Highest education level: Bachelor's degree (e.g., BA, AB, BS)  Occupational History   Occupation: retired    Associate Professor: [employer]  Tobacco Use   Smoking status: Never   Smokeless tobacco: Never  Vaping Use   Vaping status: Never Used  Substance and Sexual Activity   Alcohol use: Not Currently    Comment: Stopped a year ago   Drug use: Never  Sexual activity: Yes    Birth control/protection: None  Other Topics Concern   Not on file  Social History Narrative   Patient lives with his wife in [place].    Patient has one son in [place] and one daughter in [place].    Patient walks daily at local parks/trails around [place].    Patient has one dog.    Social Drivers of Corporate investment banker Strain: Low Risk  (11/04/2022)   Overall Financial Resource Strain (CARDIA)    Difficulty of Paying Living Expenses: Not hard at all  Food Insecurity: No Food Insecurity (04/25/2023)   Hunger Vital Sign    Worried About Running Out of Food in the Last Year: Never true    Ran Out of Food in the Last Year: Never true  Transportation Needs: No Transportation Needs (04/25/2023)   PRAPARE - Administrator, Civil Service (Medical): No    Lack of Transportation (Non-Medical): No  Physical  Activity: Sufficiently Active (11/04/2022)   Exercise Vital Sign    Days of Exercise per Week: 5 days    Minutes of Exercise per Session: 60 min  Stress: No Stress Concern Present (11/04/2022)   [employer] of Occupational Health - Occupational Stress Questionnaire    Feeling of Stress : Not at all  Social Connections: Socially Integrated (11/04/2022)   Social Connection and Isolation Panel [NHANES]    Frequency of Communication with Friends and Family: More than three times a week    Frequency of Social Gatherings with Friends and Family: More than three times a week    Attends Religious Services: More than 4 times per year    Active Member of [employer] or Organizations: Yes    Attends Engineer, structural: More than 4 times per year    Marital Status: Married  Catering manager Violence: Not At Risk (04/21/2023)   Humiliation, Afraid, Rape, and Kick questionnaire    Fear of Current or Ex-Partner: No    Emotionally Abused: No    Physically Abused: No    Sexually Abused: No      Review of systems: All other review of systems negative except as mentioned in the HPI.   Physical Exam: Vitals:   08/22/23 1336  BP: 104/70   Body mass index is 28.62 kg/m. Gen:      No acute distress HEENT:  sclera anicteric Abd:      soft, non-tender; no palpable masses, no distension Ext:    No edema Neuro: alert and oriented x 3 Psych: normal mood and affect  Data Reviewed:  Reviewed labs, radiology imaging, old records and pertinent past GI work up     Assessment and Plan    H/o SBO, gastroparesis s/p G-POEM Reports no bowel obstructions and improved gastric emptying post-surgery. No regurgitation or dilation since August 2024. Continues to have one to two bowel movements daily with Metamucil and Benefiber. No current dysphagia. Discussed potential for dumping syndrome and surgical site adjustment. Prefers current management. - Continue Metamucil and Benefiber - Encourage  dietary fiber intake through cooked vegetables and smoothies - Monitor for bowel obstruction or regurgitation - Follow up with GI if issues arise  Barrett's Esophagus Last endoscopy in 2021 with barrett's biopsies and last EGD in August 2024 during G-POEM.  Plan to monitor and perform biopsies for dysplasia or malignancy. Emphasized regular monitoring due to cancer risk. - Schedule upper endoscopy with biopsies in August 2025 - Set reminder for endoscopy  Nausea Experiences  nausea approximately four days a week, managed with Zofran. Symptoms range from mild to moderate. Advised hydration and avoiding simple carbs. Discussed FD Guard, IB Guard, and peppermint oil/tea for symptom relief. - Continue Zofran as needed - Encourage hydration and small frequent meals - Avoid simple carbohydrates - Provide FD Guard and IB Guard samples - Consider peppermint oil or tea for relief  Intestinal cramping Intermittent cramping managed with hyoscyamine, used twice a month. Insurance suggests switching due to cost, but current medication is effective. Option to take every 8 hours if needed. - Continue hyoscyamine as needed, up to every 8 hours - Discuss necessity with insurance - Consider alternatives if cost becomes prohibitive  General Health Maintenance Up to date with colonoscopy, last in 2019 with no polyps. Diverticulosis in descending colon. No need for another colonoscopy unless symptoms arise. - No immediate need for colonoscopy - Monitor for diverticulosis symptoms  Follow-up - Use MiraLAX as needed to prevent constipation - Consider reducing fiber intake post-surgery if not eating much - Schedule follow-up endoscopy in August 2025 - Monitor recovery post-urological surgery.       This visit required 40 minutes of patient care (this includes precharting, chart review, review of results, face-to-face time used for counseling as well as treatment plan and follow-up. The patient was  provided an opportunity to ask questions and all were answered. The patient agreed with the plan and demonstrated an understanding of the instructions.  Iona Beard , MD    CC: Thomas Dominguez

## 2023-08-22 NOTE — Patient Instructions (Addendum)
 VISIT SUMMARY:  Thomas Dominguez. Rendleman, a 78 year old male with a history of esophageal surgery and Barrett's esophagus, visited for a follow-up on his gastrointestinal issues. He reported improvements in swallowing and bowel movements since his surgery in August 2024. He uses Metamucil and Benefiber to regulate bowel movements and takes hyoscyamine for intestinal cramping and Zofran for nausea. He has a family history of gastrointestinal issues and finds peppermint helpful for relief.  YOUR PLAN:  -POST-SURGICAL FOLLOW-UP FOR ESOPHAGEAL AND GASTRIC SURGERY: You have shown significant improvement since your surgery, with no bowel obstructions and better stomach emptying. You should continue using Metamucil and Benefiber, increase dietary fiber through cooked vegetables and smoothies, and monitor for any signs of bowel obstruction or regurgitation. Follow up with your GI specialist if any issues arise.  -BARRETT'S ESOPHAGUS: Barrett's Esophagus is a condition where the lining of the esophagus changes, increasing the risk of cancer. Your last endoscopy was in August 2024, and we plan to schedule another with biopsies in August 2025 to monitor for any changes. We will review your previous endoscopy reports and set a reminder for the upcoming procedure.  -NAUSEA: You experience nausea about four times a week, which is managed with Zofran. To help with this, continue taking Zofran as needed, stay hydrated, eat small frequent meals, and avoid simple carbohydrates. We also provided samples of FD Guard and IB Guard, and you might consider using peppermint oil or tea for additional relief.  -INTESTINAL CRAMPING: You have intermittent cramping managed with hyoscyamine, which you use twice a month. You can continue using hyoscyamine as needed, up to every 8 hours. Discuss with your insurance if necessary and consider alternatives if the cost becomes too high.  -GENERAL HEALTH MAINTENANCE: Your last colonoscopy in 2019  showed no polyps but did reveal diverticulosis in the descending colon. You do not need another colonoscopy unless symptoms arise. Continue to monitor for any symptoms of diverticulosis.  INSTRUCTIONS:  Use MiraLAX as needed to prevent constipation. Consider reducing fiber intake post-surgery if you are not eating much. Schedule a follow-up endoscopy in August 2025. Monitor your recovery post-urological surgery.  I appreciate the  opportunity to care for you  Thank You   Marsa Aris , MD

## 2023-08-27 ENCOUNTER — Encounter: Payer: Self-pay | Admitting: *Deleted

## 2023-08-27 DIAGNOSIS — M47816 Spondylosis without myelopathy or radiculopathy, lumbar region: Secondary | ICD-10-CM | POA: Diagnosis not present

## 2023-08-27 NOTE — Telephone Encounter (Signed)
 Completed application for [name] pt assist. Found.) faxed today.  MyChart msg sent to pt to update.

## 2023-08-29 DIAGNOSIS — N400 Enlarged prostate without lower urinary tract symptoms: Secondary | ICD-10-CM | POA: Diagnosis not present

## 2023-08-29 DIAGNOSIS — R3912 Poor urinary stream: Secondary | ICD-10-CM | POA: Diagnosis not present

## 2023-08-29 DIAGNOSIS — R350 Frequency of micturition: Secondary | ICD-10-CM | POA: Diagnosis not present

## 2023-08-29 DIAGNOSIS — N401 Enlarged prostate with lower urinary tract symptoms: Secondary | ICD-10-CM | POA: Diagnosis not present

## 2023-08-30 ENCOUNTER — Encounter: Payer: Self-pay | Admitting: Pharmacist Clinician (PhC)/ Clinical Pharmacy Specialist

## 2023-08-30 ENCOUNTER — Other Ambulatory Visit: Payer: Self-pay | Admitting: Pharmacist Clinician (PhC)/ Clinical Pharmacy Specialist

## 2023-08-30 DIAGNOSIS — E785 Hyperlipidemia, unspecified: Secondary | ICD-10-CM

## 2023-09-03 DIAGNOSIS — R3912 Poor urinary stream: Secondary | ICD-10-CM | POA: Diagnosis not present

## 2023-09-14 DIAGNOSIS — E785 Hyperlipidemia, unspecified: Secondary | ICD-10-CM | POA: Diagnosis not present

## 2023-09-15 LAB — LIPID PANEL
Chol/HDL Ratio: 3.3 ratio (ref 0.0–5.0)
Cholesterol, Total: 167 mg/dL (ref 100–199)
HDL: 50 mg/dL (ref >39)
LDL Chol Calc (NIH): 83 mg/dL (ref 0–99)
Triglycerides: 200 mg/dL — ABNORMAL HIGH (ref 0–149)
VLDL Cholesterol Cal: 34 mg/dL (ref 5–40)

## 2023-09-19 ENCOUNTER — Encounter: Payer: Self-pay | Admitting: Pharmacist Clinician (PhC)/ Clinical Pharmacy Specialist

## 2023-09-20 DIAGNOSIS — M47816 Spondylosis without myelopathy or radiculopathy, lumbar region: Secondary | ICD-10-CM | POA: Diagnosis not present

## 2023-09-25 HISTORY — PX: TRANSURETHRAL RESECTION OF PROSTATE: SHX73

## 2023-10-08 ENCOUNTER — Encounter: Payer: Self-pay | Admitting: Internal Medicine

## 2023-10-08 ENCOUNTER — Ambulatory Visit: Payer: Medicare HMO | Attending: Internal Medicine | Admitting: Internal Medicine

## 2023-10-08 VITALS — BP 110/88 | HR 77 | Ht 72.0 in | Wt 211.4 lb

## 2023-10-08 DIAGNOSIS — E785 Hyperlipidemia, unspecified: Secondary | ICD-10-CM

## 2023-10-08 DIAGNOSIS — I480 Paroxysmal atrial fibrillation: Secondary | ICD-10-CM | POA: Diagnosis not present

## 2023-10-08 NOTE — Progress Notes (Signed)
 Cardiology Office Note:  .   Date:  10/08/2023  ID:  Windy Fast L. Zeddie, Njie June 17, 1946, MRN [id number] PCP: Shelby Mattocks, DO  [facility] Providers Cardiologist:  Parke Poisson, MD Electrophysiologist:  Lanier Prude, MD    History of Present Illness: .   Ervie L. Archambeau is a 78 y.o. male.  Discussed the use of AI scribe software for clinical note transcription with the patient, who gave verbal consent to proceed.  History of Present Illness The patient, with a history of atrial fibrillation, esophageal cancer status post esophagectomy, benign prostatic hyperplasia (BPH), depression, and degenerative disc disease, presents for a follow-up visit. The patient is currently on propafenone 225mg  three times daily and metoprolol succinate 25mg  daily for atrial fibrillation. The patient's CHADS2 VASc score is two for age. The patient's last echocardiogram was structurally normal in 2020 and a cardiac monitor in 2020 showed no evidence of recurrent atrial fibrillation. The patient continues on Eliquis 5mg  twice daily for anticoagulation. The patient was evaluated for a Watchman device at [facility] but decided against it. The patient has also had episodes of supraventricular tachycardia (SVT) which are well controlled with metoprolol and propafenone. The patient's dyspnea has been thoroughly evaluated, including a bronchoscopy and a coronary CTA which showed mild nonobstructive coronary artery disease. The patient recently underwent prostate surgery and nerve ablations for back pain.    ROS: negative except per HPI above.  Studies Reviewed: Marland Kitchen   EKG Interpretation Date/Time:  Monday October 08 2023 10:48:22 EDT Ventricular Rate:  74 PR Interval:  198 QRS Duration:  80 QT Interval:  366 QTC Calculation: 406 R Axis:   82  Text Interpretation: Normal sinus rhythm Septal infarct , age undetermined When compared with ECG of 21-Apr-2023 07:46, No significant change since last tracing  Confirmed by Weston Brass ([id number]) on 10/08/2023 11:00:30 AM    Results LABS LDL: 83  RADIOLOGY Coronary CTA: Mild nonobstructive CAD  DIAGNOSTIC Echo: Structurally normal (2020) Cardiac monitor: No evidence of recurrent afib (2020) EKG: Normal sinus rhythm, no significant change from last tracing (10/08/2023)  PATHOLOGY Bronchoscopy with biopsy: Unremarkable Risk Assessment/Calculations:    CHA2DS2-VASc Score = 3   This indicates a 3.2% annual risk of stroke. The patient's score is based upon: CHF History: 1 HTN History: 0 Diabetes History: 0 Stroke History: 0 Vascular Disease History: 0 Age Score: 2 Gender Score: 0        Physical Exam:   VS:  BP 110/88 (BP Location: Left Arm, Patient Position: Sitting, Cuff Size: Normal)   Pulse 77   Ht 6' (1.829 m)   Wt 211 lb 6.4 oz (95.9 kg)   SpO2 97%   BMI 28.67 kg/m    Wt Readings from Last 3 Encounters:  10/08/23 211 lb 6.4 oz (95.9 kg)  08/22/23 211 lb (95.7 kg)  04/30/23 207 lb 4 oz (94 kg)     Physical Exam VITALS: P- 77, BP- 110/88 MEASUREMENTS: Weight- 95. GENERAL: Alert, cooperative, well developed, no acute distress. HEENT: Normocephalic, normal oropharynx, moist mucous membranes. CHEST: Clear to auscultation bilaterally, no wheezes, rhonchi, or crackles. CARDIOVASCULAR: Normal heart rate and rhythm, S1 and S2 normal without murmurs. ABDOMEN: Soft, non-tender, non-distended, without organomegaly, normal bowel sounds. EXTREMITIES: No cyanosis or edema. NEUROLOGICAL: Cranial nerves grossly intact, moves all extremities without gross motor or sensory deficit.   ASSESSMENT AND PLAN: .    Assessment and Plan Assessment & Plan Atrial Fibrillation In sinus rhythm today. Paroxysmal atrial fibrillation, well-controlled with  propafenone and metoprolol. CHADS2 VASc score is 2. Last echocardiogram and cardiac monitor in 2020 showed no recurrence. Continues on Eliquis 5 mg BID. Criteria for dose reduction not met.  No significant bleeding issues. - Continue propafenone 225 mg TID. - Continue metoprolol succinate 25 mg daily. - Continue Eliquis 5 mg BID.  Supraventricular Tachycardia (SVT) Palpitations and SVT well-controlled with current medication regimen. - Continue metoprolol succ 25 mg daily.  Coronary Artery Disease (CAD) Mild nonobstructive CAD. No current symptoms beyond previous evaluations. - Continue current management. - on eliquis 5 mg bid  Hyperlipidemia LDL cholesterol is 83 mg/dL. Triglycerides require dietary management. Advised dietary adjustments to limit intake of white flour foods. - Continue Zetia 10 mg daily. - Continue dietary modifications.      Grady Lawman, MD, [facility]

## 2023-10-08 NOTE — Patient Instructions (Signed)
 Medication Instructions:  No Changes *If you need a refill on your cardiac medications before your next appointment, please call your pharmacy*  Lab Work: None  Follow-Up: At [facility], you and your health needs are our priority.  As part of our continuing mission to provide you with exceptional heart care, our providers are all part of one team.  This team includes your primary Cardiologist (physician) and Advanced Practice Providers or APPs (Physician Assistants and Nurse Practitioners) who all work together to provide you with the care you need, when you need it.  Your next appointment:   1 year(s)  Provider:   Parke Poisson, MD     Other Instructions Please call us or send a MyChart message with any Cardiology related questions/concerns.  [phone].  Thank you!         Valet parking services will be available as well.

## 2023-10-16 DIAGNOSIS — R3915 Urgency of urination: Secondary | ICD-10-CM | POA: Diagnosis not present

## 2023-10-30 ENCOUNTER — Other Ambulatory Visit: Payer: Self-pay

## 2023-10-30 DIAGNOSIS — M51369 Other intervertebral disc degeneration, lumbar region without mention of lumbar back pain or lower extremity pain: Secondary | ICD-10-CM

## 2023-10-30 NOTE — Telephone Encounter (Signed)
 [facility] faxed a Therapy Continuation Request Form to be completed by PCP.  Placed in PCP's box  Christ Courier, CMA

## 2023-10-31 MED ORDER — TRAMADOL HCL 50 MG PO TABS: 50.0000 mg | ORAL_TABLET | Freq: Two times a day (BID) | ORAL | 0 refills | Status: DC | PRN

## 2023-11-11 ENCOUNTER — Encounter: Payer: Self-pay | Admitting: Gastroenterology

## 2023-11-14 DIAGNOSIS — J849 Interstitial pulmonary disease, unspecified: Secondary | ICD-10-CM | POA: Diagnosis not present

## 2023-11-14 DIAGNOSIS — Z9889 Other specified postprocedural states: Secondary | ICD-10-CM | POA: Diagnosis not present

## 2023-11-14 DIAGNOSIS — Z8719 Personal history of other diseases of the digestive system: Secondary | ICD-10-CM | POA: Diagnosis not present

## 2023-11-14 DIAGNOSIS — Z79899 Other long term (current) drug therapy: Secondary | ICD-10-CM | POA: Diagnosis not present

## 2023-11-14 DIAGNOSIS — K219 Gastro-esophageal reflux disease without esophagitis: Secondary | ICD-10-CM | POA: Diagnosis not present

## 2023-11-14 DIAGNOSIS — T17908D Unspecified foreign body in respiratory tract, part unspecified causing other injury, subsequent encounter: Secondary | ICD-10-CM | POA: Diagnosis not present

## 2023-11-14 DIAGNOSIS — R0609 Other forms of dyspnea: Secondary | ICD-10-CM | POA: Diagnosis not present

## 2023-11-14 DIAGNOSIS — K311 Adult hypertrophic pyloric stenosis: Secondary | ICD-10-CM | POA: Diagnosis not present

## 2023-11-14 DIAGNOSIS — K449 Diaphragmatic hernia without obstruction or gangrene: Secondary | ICD-10-CM | POA: Diagnosis not present

## 2023-11-15 DIAGNOSIS — M25552 Pain in left hip: Secondary | ICD-10-CM | POA: Diagnosis not present

## 2023-11-15 DIAGNOSIS — M5136 Other intervertebral disc degeneration, lumbar region with discogenic back pain only: Secondary | ICD-10-CM | POA: Diagnosis not present

## 2023-11-15 DIAGNOSIS — Z981 Arthrodesis status: Secondary | ICD-10-CM | POA: Diagnosis not present

## 2023-11-15 DIAGNOSIS — M1612 Unilateral primary osteoarthritis, left hip: Secondary | ICD-10-CM | POA: Diagnosis not present

## 2023-11-15 DIAGNOSIS — M47816 Spondylosis without myelopathy or radiculopathy, lumbar region: Secondary | ICD-10-CM | POA: Diagnosis not present

## 2023-11-17 ENCOUNTER — Other Ambulatory Visit: Payer: Self-pay | Admitting: Student

## 2023-11-26 ENCOUNTER — Encounter: Payer: Self-pay | Admitting: Pharmacist Clinician (PhC)/ Clinical Pharmacy Specialist

## 2023-11-28 MED ORDER — PROMETHAZINE HCL 12.5 MG PO TABS: 12.5000 mg | ORAL_TABLET | Freq: Four times a day (QID) | ORAL | 0 refills | Status: DC | PRN

## 2023-11-29 DIAGNOSIS — M25552 Pain in left hip: Secondary | ICD-10-CM | POA: Diagnosis not present

## 2023-12-04 ENCOUNTER — Encounter: Payer: Self-pay | Admitting: *Deleted

## 2023-12-25 DIAGNOSIS — M25552 Pain in left hip: Secondary | ICD-10-CM | POA: Diagnosis not present

## 2023-12-25 DIAGNOSIS — M47816 Spondylosis without myelopathy or radiculopathy, lumbar region: Secondary | ICD-10-CM | POA: Diagnosis not present

## 2023-12-31 ENCOUNTER — Other Ambulatory Visit (HOSPITAL_BASED_OUTPATIENT_CLINIC_OR_DEPARTMENT_OTHER): Payer: Self-pay | Admitting: Cardiology

## 2024-01-02 ENCOUNTER — Encounter: Payer: Self-pay | Admitting: Gastroenterology

## 2024-01-02 ENCOUNTER — Other Ambulatory Visit: Payer: Self-pay

## 2024-01-02 MED ORDER — PROMETHAZINE HCL 12.5 MG PO TABS: 12.5000 mg | ORAL_TABLET | Freq: Four times a day (QID) | ORAL | 2 refills | Status: AC | PRN

## 2024-01-04 ENCOUNTER — Other Ambulatory Visit: Payer: Self-pay

## 2024-01-04 DIAGNOSIS — M51369 Other intervertebral disc degeneration, lumbar region without mention of lumbar back pain or lower extremity pain: Secondary | ICD-10-CM

## 2024-01-04 MED ORDER — TRAMADOL HCL 50 MG PO TABS: 50.0000 mg | ORAL_TABLET | Freq: Two times a day (BID) | ORAL | 0 refills | Status: DC | PRN

## 2024-01-11 ENCOUNTER — Ambulatory Visit

## 2024-01-11 VITALS — BP 122/84 | HR 78 | Ht 72.0 in | Wt 209.4 lb

## 2024-01-11 DIAGNOSIS — R5383 Other fatigue: Secondary | ICD-10-CM

## 2024-01-11 DIAGNOSIS — N401 Enlarged prostate with lower urinary tract symptoms: Secondary | ICD-10-CM

## 2024-01-11 DIAGNOSIS — Z0000 Encounter for general adult medical examination without abnormal findings: Secondary | ICD-10-CM | POA: Diagnosis not present

## 2024-01-11 DIAGNOSIS — R29898 Other symptoms and signs involving the musculoskeletal system: Secondary | ICD-10-CM

## 2024-01-11 DIAGNOSIS — R519 Headache, unspecified: Secondary | ICD-10-CM

## 2024-01-11 DIAGNOSIS — N3943 Post-void dribbling: Secondary | ICD-10-CM | POA: Diagnosis not present

## 2024-01-11 LAB — POCT GLYCOSYLATED HEMOGLOBIN (HGB A1C): Hemoglobin A1C: 5.6 % (ref 4.0–5.6)

## 2024-01-11 NOTE — Assessment & Plan Note (Addendum)
 Pt now 3 months s/p TURP with symptomatic improvement

## 2024-01-11 NOTE — Progress Notes (Signed)
    SUBJECTIVE:   Chief compliant/HPI: annual examination  Thomas Dominguez is a 78 y.o. who presents today for an annual exam.   History tabs reviewed and updated.   Review of systems form reviewed and notable for weakness, fatigue, and new headaches.   Upper legs feel weak. He notices he has to use his arms to push up from a chair on occasion and sometimes his first step feels shaky. He walks without difficulty. His primary complaint is rising from sitting or squatting. 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 Previously treated back problems. RFA on the right. Currently doing 3 times per week HEP strengthening from PT for his back last year. No neuropathic complaints currently.   Fatigued throughout the day despite 10-11 hours of sleep at night. Reports sleep is very good. No lightheadedness or dizziness. No weight changes or new temperature intolerance.  Frequent headaches for about a month. Bitemporal. 5 days a week. Dull. About One time per week takes tylenol  for headache followed by a tramadol  if no improvement. No NSAIDs.  No new fragrances, soaps, or chemicals in the home. No associated vision changes or asymetric muscle changes.  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   OBJECTIVE:   BP 122/84   Pulse 78   Ht 6' (1.829 m)   Wt 209 lb 6.4 oz (95 kg)   SpO2 97%   BMI 28.40 kg/m   General: well appearing, alert CV: regular rhythm and rate today, no M/r/g Pulm: CTAB, no increased WOB Abd: + BS 4 quadrants with bowel sounds in the chest, abdomen mild distension but no tenderness, rebound, or guarding  ASSESSMENT/PLAN:   Assessment & Plan Fatigue, unspecified type Ordered TSH, CBC, BMP Routine adult health maintenance Ordered LP, A1C Acute nonintractable headache, unspecified headache type Ordered head CT Can increase tylenol  to 1000 mg q6h PRN Benign prostatic hyperplasia with  post-void dribbling Pt now 3 months s/p TURP with symptomatic improvement Weakness of both lower extremities Ordered TSH, CBC, BMP Continue strengthening HEP   Annual Examination  He will plan to get his flu vaccine in August. Otherwise up to date on vaccines and screenings.  See AVS for age appropriate recommendations.  PHQ score 6, reviewed and discussed.  Blood pressure value is at goal.   Considered the following screening exams based upon USPSTF recommendations: Diabetes screening: ordered Screening for elevated cholesterol: ordered HIV testing: not recommended Hepatitis C: not recommended Hepatitis B: not recommended Syphilis if at high risk: not recommended Reviewed risk factors for latent tuberculosis and not indicated Colorectal cancer screening: up to date on screening for CRC. Lung cancer screening: done , not indicated additional  Follow up in 1 year or sooner if indicated.  MyChart Activation: Already signed up   [name] [name], MD [facility]

## 2024-01-11 NOTE — Patient Instructions (Addendum)
 It was wonderful to see you today.  Please bring ALL of your medications with you to every visit.   Today we talked about:  We ordered a head CT today to evaluate your new headaches. My nurse will call you to schedule that.  Keep doing your home strengthening exercises to help with your leg weakness. I have ordered some labs to help evaluate that and your fatigue, and I will let you know if they are abnormal.  We ordered additional screening labs for your blood sugar to see if you have diabetes and to check on your cholesterol. I will send you a mychart message if we need to do anything about those results.  Thank you for choosing [facility].   Please call [phone] with any questions about today's appointment.  Please arrive at least 15 minutes prior to your scheduled appointments.   If you had blood work today, I will send you a MyChart message or a letter if results are normal. Otherwise, I will give you a call.   If you had a referral placed, they will call you to set up an appointment. Please give us  a call if you don't hear back in the next 2 weeks.   If you need additional refills before your next appointment, please call your pharmacy first.   Shaelin Lalley [name], MD  [facility]

## 2024-01-12 LAB — CBC
Hematocrit: 48 % (ref 37.5–51.0)
Hemoglobin: 16.1 g/dL (ref 13.0–17.7)
MCH: 32.5 pg (ref 26.6–33.0)
MCHC: 33.5 g/dL (ref 31.5–35.7)
MCV: 97 fL (ref 79–97)
Platelets: 191 x10E3/uL (ref 150–450)
RBC: 4.95 x10E6/uL (ref 4.14–5.80)
RDW: 13.1 % (ref 11.6–15.4)
WBC: 5.6 x10E3/uL (ref 3.4–10.8)

## 2024-01-12 LAB — LIPID PANEL
Chol/HDL Ratio: 4.1 ratio (ref 0.0–5.0)
Cholesterol, Total: 242 mg/dL — ABNORMAL HIGH (ref 100–199)
HDL: 59 mg/dL (ref >39)
LDL Chol Calc (NIH): 124 mg/dL — ABNORMAL HIGH (ref 0–99)
Triglycerides: 337 mg/dL — ABNORMAL HIGH (ref 0–149)
VLDL Cholesterol Cal: 59 mg/dL — ABNORMAL HIGH (ref 5–40)

## 2024-01-12 LAB — BASIC METABOLIC PANEL WITH GFR
BUN/Creatinine Ratio: 15 (ref 10–24)
BUN: 15 mg/dL (ref 8–27)
CO2: 23 mmol/L (ref 20–29)
Calcium: 9.3 mg/dL (ref 8.6–10.2)
Chloride: 100 mmol/L (ref 96–106)
Creatinine, Ser: 1.02 mg/dL (ref 0.76–1.27)
Glucose: 101 mg/dL — ABNORMAL HIGH (ref 70–99)
Potassium: 5.3 mmol/L — ABNORMAL HIGH (ref 3.5–5.2)
Sodium: 140 mmol/L (ref 134–144)
eGFR: 76 mL/min/1.73 (ref >59)

## 2024-01-12 LAB — TSH: TSH: 2.88 u[IU]/mL (ref 0.450–4.500)

## 2024-01-13 ENCOUNTER — Encounter: Payer: Self-pay | Admitting: Internal Medicine

## 2024-01-13 ENCOUNTER — Encounter: Payer: Self-pay | Admitting: Gastroenterology

## 2024-01-14 ENCOUNTER — Other Ambulatory Visit: Payer: Self-pay | Admitting: Internal Medicine

## 2024-01-14 ENCOUNTER — Ambulatory Visit: Payer: Self-pay

## 2024-01-14 DIAGNOSIS — E875 Hyperkalemia: Secondary | ICD-10-CM

## 2024-01-14 DIAGNOSIS — R5383 Other fatigue: Secondary | ICD-10-CM

## 2024-01-16 NOTE — Telephone Encounter (Signed)
 Last read by Tanda L. Contino [name] at 3:58PM on 01/14/2024.

## 2024-01-18 ENCOUNTER — Ambulatory Visit
Admission: RE | Admit: 2024-01-18 | Discharge: 2024-01-18 | Disposition: A | Discharge status: Home / Self Care | Source: Ambulatory Visit | Attending: Family Medicine | Admitting: Family Medicine

## 2024-01-18 DIAGNOSIS — R519 Headache, unspecified: Secondary | ICD-10-CM | POA: Diagnosis not present

## 2024-01-18 DIAGNOSIS — R110 Nausea: Secondary | ICD-10-CM | POA: Diagnosis not present

## 2024-01-20 ENCOUNTER — Encounter: Payer: Self-pay | Admitting: Internal Medicine

## 2024-01-21 ENCOUNTER — Other Ambulatory Visit

## 2024-01-21 DIAGNOSIS — E875 Hyperkalemia: Secondary | ICD-10-CM

## 2024-01-21 DIAGNOSIS — R5383 Other fatigue: Secondary | ICD-10-CM | POA: Diagnosis not present

## 2024-01-22 ENCOUNTER — Ambulatory Visit: Payer: Self-pay

## 2024-01-22 LAB — BASIC METABOLIC PANEL WITH GFR
BUN/Creatinine Ratio: 13 (ref 10–24)
BUN: 11 mg/dL (ref 8–27)
CO2: 25 mmol/L (ref 20–29)
Calcium: 9.5 mg/dL (ref 8.6–10.2)
Chloride: 101 mmol/L (ref 96–106)
Creatinine, Ser: 0.88 mg/dL (ref 0.76–1.27)
Glucose: 108 mg/dL — ABNORMAL HIGH (ref 70–99)
Potassium: 4.9 mmol/L (ref 3.5–5.2)
Sodium: 142 mmol/L (ref 134–144)
eGFR: 89 mL/min/1.73 (ref >59)

## 2024-01-22 LAB — TESTOSTERONE: Testosterone: 451 ng/dL (ref 264–916)

## 2024-01-25 NOTE — Progress Notes (Signed)
 Called patient to discuss normal CT results.  Schedule the patient to be seen next week to follow-up on concerns discussed during annual well check for which imaging and lab testing were unrevealing of the cause.

## 2024-01-29 ENCOUNTER — Encounter: Payer: Self-pay | Admitting: Student

## 2024-01-29 ENCOUNTER — Ambulatory Visit (INDEPENDENT_AMBULATORY_CARE_PROVIDER_SITE_OTHER): Admitting: Student

## 2024-01-29 VITALS — BP 97/50 | HR 73 | Ht 72.0 in | Wt 210.2 lb

## 2024-01-29 DIAGNOSIS — J0110 Acute frontal sinusitis, unspecified: Secondary | ICD-10-CM | POA: Diagnosis not present

## 2024-01-29 DIAGNOSIS — R29898 Other symptoms and signs involving the musculoskeletal system: Secondary | ICD-10-CM | POA: Diagnosis not present

## 2024-01-29 MED ORDER — FLUTICASONE PROPIONATE 50 MCG/ACT NA SUSP: 2.0000 | Freq: Every day | NASAL | 6 refills | Status: DC

## 2024-01-29 NOTE — Patient Instructions (Signed)
 Pleasure to meet you today.  Suspect the headache you are having is more consistent with an ascites.  I have sent in prescription for Flonase  which is a nasal spray that should help with drying of the nose and also help open up your sinuses.  Continue to do Tylenol  for headache.  Today I also ordered labs to check your sed rate to make sure you you have the weakness in your thighs is not due to an inflammatory process.  Continue to do the home physical therapy.

## 2024-01-29 NOTE — Progress Notes (Signed)
    SUBJECTIVE:   CHIEF COMPLAINT / HPI:   Thomas Dominguez is a 78 year old male who presents with constant headaches and weakness in the thighs.  He experiences constant headaches almost daily, described as a band-like sensation around the head, particularly affecting the sinus area and extending to the sides. There is occasional clear nasal discharge, but no tearing of the eyes or double vision.  Weakness in the thighs has been present for about a month, making it difficult to rise from a seated position without assistance. His legs feel wobbly once standing, but he can walk a mile on the treadmill, with soreness in the thighs afterward. There is no weakness in the shoulders.  He experiences significant tiredness, despite sleeping well and not using sleep supplements. This tiredness feels like 'somebody put anesthesia' on him and has been present longer than the thigh weakness.  He has a history of back issues, including disc fusions and sciatica, and receives injections for hip arthritis. Previous workup included a head CT and extensive blood work, both negative, with normal TSH levels.  PERTINENT  PMH / PSH: Reviewed   OBJECTIVE:   BP (!) 97/50   Pulse 73   Ht 6' (1.829 m)   Wt 210 lb 3.2 oz (95.3 kg)   SpO2 100%   BMI 28.51 kg/m    Physical Exam General: Alert, well appearing, NAD Cardiovascular: RRR, No Murmurs, Normal S2/S2 Respiratory: CTAB, No wheezing or Rales Abdomen: No distension or tenderness Neuro: ANO x 3, cranial nerves II to XII intact.  5 out of 5 muscle strength in all extremities with sensations intact.  ASSESSMENT/PLAN:   Headache due to sinusitis Chronic daily headaches with nausea, likely sinusitis-related. Band-like sensation around head consistent with sinus headaches. Clear nasal discharge and post-nasal drip may contribute to nausea.  Recent head CT negative. - Prescribed Flonase  nasal spray to dry nasal passages and open sinuses. - Advised  continuation of Tylenol  for headache management.  Thigh muscle weakness Progressive thigh weakness, noticeable when rising from seated position. Strength testing reassuring. Previous labs negative. Neurologist involved for back issues. Differential includes age-related changes or inflammatory process. - Ordered sed rate to evaluate for inflammatory process. - Continue daily physical therapy exercises as instructed.   Norleen April, MD [facility]

## 2024-01-30 LAB — SEDIMENTATION RATE: Sed Rate: 3 mm/h (ref 0–30)

## 2024-02-01 ENCOUNTER — Ambulatory Visit: Payer: Self-pay | Admitting: Student

## 2024-02-12 ENCOUNTER — Other Ambulatory Visit: Payer: Self-pay | Admitting: Family Medicine

## 2024-02-12 DIAGNOSIS — M51369 Other intervertebral disc degeneration, lumbar region without mention of lumbar back pain or lower extremity pain: Secondary | ICD-10-CM

## 2024-02-20 ENCOUNTER — Other Ambulatory Visit: Payer: Self-pay

## 2024-02-20 MED ORDER — NALOXONE HCL 4 MG/0.1ML NA LIQD
NASAL | 3 refills | Status: DC
Start: 2024-02-20 — End: 2024-03-13

## 2024-03-03 ENCOUNTER — Encounter: Payer: Self-pay | Admitting: Gastroenterology

## 2024-03-04 ENCOUNTER — Telehealth: Payer: Self-pay

## 2024-03-04 NOTE — Telephone Encounter (Signed)
 [facility] Pre-operative Risk Assessment     Request for surgical clearance:     Endoscopy Procedure  What type of surgery is being performed?     EGD  When is this surgery scheduled?     TBD  What type of clearance is required ?   Pharmacy  Are there any medications that need to be held prior to surgery and how long? Eliquis   Practice name and name of physician performing surgery?      [facility] with Dr Shila, MD  What is your office phone and fax number?      Phone- [phone]  Fax- [phone]  Anesthesia type (None, local, MAC, general) ?       MAC   Please route your response to Almarie Goo, LPN

## 2024-03-05 ENCOUNTER — Other Ambulatory Visit: Payer: Self-pay | Admitting: Gastroenterology

## 2024-03-05 ENCOUNTER — Encounter: Payer: Self-pay | Admitting: Gastroenterology

## 2024-03-06 NOTE — Telephone Encounter (Signed)
 Patient is scheduled for 03/27/24 to have the esophagogastroduodenoscopy.

## 2024-03-07 NOTE — Telephone Encounter (Signed)
 Patient with diagnosis of afib on Eliquis  for anticoagulation.    Procedure: EGD Date of procedure: TBD   CHA2DS2-VASc Score = 3   This indicates a 3.2% annual risk of stroke. The patient's score is based upon: CHF History: 0 HTN History: 0 Diabetes History: 0 Stroke History: 0 Vascular Disease History: 1 Age Score: 2 Gender Score: 0      CrCl 93 ml/min Platelet count 191  Patient has not had an Afib/aflutter ablation or Watchman within the last 3 months or DCCV within the last 30 days   Per office protocol, patient can hold Eliquis  for 2 days prior to procedure.    **This guidance is not considered finalized until pre-operative APP has relayed final recommendations.**

## 2024-03-07 NOTE — Telephone Encounter (Signed)
   Patient Name: Thomas Dominguez. Grenz  DOB: 08-Jan-1946 MRN: [id number]  Primary Cardiologist: Soyla DELENA Merck, MD  Clinical pharmacists have reviewed the patient's past medical history, labs, and current medications as part of preoperative protocol coverage. The following recommendations have been made:  Patient with diagnosis of afib on Eliquis  for anticoagulation.     Procedure: EGD Date of procedure: TBD     CHA2DS2-VASc Score = 3   This indicates a 3.2% annual risk of stroke. The patient's score is based upon: CHF History: 0 HTN History: 0 Diabetes History: 0 Stroke History: 0 Vascular Disease History: 1 Age Score: 2 Gender Score: 0     CrCl 93 ml/min Platelet count 191   Patient has not had an Afib/aflutter ablation or Watchman within the last 3 months or DCCV within the last 30 days    Per office protocol, patient can hold Eliquis  for 2 days prior to procedure.       I will route this recommendation to the requesting party via Epic fax function and remove from pre-op pool.  Please call with questions.  Lamarr Satterfield, NP 03/07/2024, 10:10 AM

## 2024-03-10 ENCOUNTER — Other Ambulatory Visit: Payer: Self-pay

## 2024-03-10 DIAGNOSIS — J0110 Acute frontal sinusitis, unspecified: Secondary | ICD-10-CM

## 2024-03-10 MED ORDER — FLUTICASONE PROPIONATE 50 MCG/ACT NA SUSP
2.0000 | Freq: Every day | NASAL | 6 refills | Status: AC
Start: 2024-03-10 — End: ?

## 2024-03-13 ENCOUNTER — Encounter: Payer: Self-pay | Admitting: Gastroenterology

## 2024-03-13 ENCOUNTER — Ambulatory Visit

## 2024-03-13 VITALS — Ht 72.0 in | Wt 208.0 lb

## 2024-03-13 DIAGNOSIS — K22719 Barrett's esophagus with dysplasia, unspecified: Secondary | ICD-10-CM

## 2024-03-13 DIAGNOSIS — K2270 Barrett's esophagus without dysplasia: Secondary | ICD-10-CM

## 2024-03-13 NOTE — Progress Notes (Signed)
 No issues known to pt with past sedation with any surgeries or procedures Patient denies ever being told they had issues or difficulty with intubation  No FH of Malignant Hyperthermia Pt is not on diet pills nor GLP-1 medications Pt is not on home 02  Pt is on blood thinner--Eliquis  Pt denies issues with constipation  Pt has A fib  Have any cardiac testing pending--no Pt instructed to use [email or web address] or GoodRx for a price reduction on prep  Ambulates independently

## 2024-03-26 ENCOUNTER — Other Ambulatory Visit: Payer: Self-pay | Admitting: Internal Medicine

## 2024-03-27 ENCOUNTER — Encounter: Payer: Self-pay | Admitting: Gastroenterology

## 2024-03-27 ENCOUNTER — Ambulatory Visit: Admitting: Gastroenterology

## 2024-03-27 VITALS — BP 126/80 | HR 68 | Temp 97.2°F | Resp 14 | Ht 72.0 in | Wt 208.0 lb

## 2024-03-27 DIAGNOSIS — Z13810 Encounter for screening for upper gastrointestinal disorder: Secondary | ICD-10-CM

## 2024-03-27 DIAGNOSIS — T182XXA Foreign body in stomach, initial encounter: Secondary | ICD-10-CM

## 2024-03-27 DIAGNOSIS — Z980 Intestinal bypass and anastomosis status: Secondary | ICD-10-CM | POA: Diagnosis not present

## 2024-03-27 DIAGNOSIS — F329 Major depressive disorder, single episode, unspecified: Secondary | ICD-10-CM | POA: Diagnosis not present

## 2024-03-27 DIAGNOSIS — K2270 Barrett's esophagus without dysplasia: Secondary | ICD-10-CM | POA: Diagnosis not present

## 2024-03-27 DIAGNOSIS — I4891 Unspecified atrial fibrillation: Secondary | ICD-10-CM | POA: Diagnosis not present

## 2024-03-27 DIAGNOSIS — E785 Hyperlipidemia, unspecified: Secondary | ICD-10-CM | POA: Diagnosis not present

## 2024-03-27 DIAGNOSIS — I10 Essential (primary) hypertension: Secondary | ICD-10-CM | POA: Diagnosis not present

## 2024-03-27 MED ORDER — SODIUM CHLORIDE 0.9 % IV SOLN: 500.0000 mL | Freq: Once | INTRAVENOUS | Status: DC

## 2024-03-27 NOTE — Progress Notes (Signed)
 Report given to PACU, vss

## 2024-03-27 NOTE — Progress Notes (Signed)
 Called to room to assist during endoscopic procedure.  Patient ID and intended procedure confirmed with present staff. Received instructions for my participation in the procedure from the performing physician.

## 2024-03-27 NOTE — Progress Notes (Signed)
 [facility] History and Physical   Primary Care Physician:  [name], Elio, MD   Reason for Procedure:  Barrett's esophagus surveillance  Plan:    EGD  with possible interventions as needed     HPI: Thomas Dominguez is a very pleasant 78 y.o. male here for EGD for surveillance of Barrett's esophagus.   The risks and benefits as well as alternatives of endoscopic procedure(s) have been discussed and reviewed. All questions answered. The patient agrees to proceed.    Past Medical History:  Diagnosis Date   Acute ataxia 09/30/2019   Acute pneumothorax 04/08/2020   Allergy Mobic...SABRASABRAStatins   Atrial fibrillation (HCC) 01/17/2019   BPH (benign prostatic hyperplasia) 01/17/2019   Chronic pulmonary aspiration    Degenerative disc disease, lumbar 01/17/2019   Depression, major, single episode, complete remission 01/17/2019   Displacement of lumbar intervertebral disc without myelopathy 04/22/2010   Diverticulosis 01/17/2019   Dysrhythmia    Dysrhythmias 12/19/2011   Esophageal cancer (HCC)    Gastroparesis    GERD (gastroesophageal reflux disease)    H/O small bowel obstruction 01/17/2019   Hiatal hernia 01/17/2019   History of es[facility]ctomy [facility] at [facility] for severe Barrett's with microfocus of cancer 11/22/2019   Hyperlipidemia 2020   Hypertension    ILD (interstitial lung disease) (HCC)    Inguinal hernia    bilateral, left more prominant   Left inguinal hernia 03/21/2021   Leg swelling 01/17/2019   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HCC)    Melanoma (HCC) 07/2020   Dr Rolan Molt   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   Pneumonia 11/2019   Pneumothorax 04/08/2020   Sacroiliitis    SBO (small bowel obstruction) (HCC) 11/20/2019   Status post left inguinal hernia repair 04/15/2021   Wears glasses     Past Surgical History:  Procedure Laterality Date   ABDOMINAL HERNIA REPAIR  2012, 2014, 2017   ventral   BRONCHIAL BIOPSY  04/08/2020   Procedure:  BRONCHIAL BIOPSIES;  Surgeon: Mannam, Praveen, MD;  Location: [facility];  Service: Cardiopulmonary;;   BRONCHIAL BRUSHINGS  04/08/2020   Procedure: BRONCHIAL BRUSHINGS;  Surgeon: Mannam, Praveen, MD;  Location: [facility];  Service: Cardiopulmonary;;   BRONCHIAL WASHINGS  04/08/2020   Procedure: BRONCHIAL WASHINGS;  Surgeon: Mannam, Praveen, MD;  Location: [facility];  Service: Cardiopulmonary;;   COLONOSCOPY  2019   ESOPHAGECTOMY  1997   for esophageal cancer   ESOPHAGOGASTRODUODENOSCOPY (EGD) WITH PROPOFOL  N/A 06/11/2020   Procedure: ESOPHAGOGASTRODUODENOSCOPY (EGD) WITH PROPOFOL ;  Surgeon: Shila Gustav GAILS, MD;  Location: [facility];  Service: Endoscopy;  Laterality: N/A;   EYE SURGERY  2\27\2024   Cataracts removed   HERNIA REPAIR  Ventral,incisional 5x & inquinal       plu5   Last 03/22/2021   INGUINAL HERNIA REPAIR Left 03/22/2021   Procedure: OPEN REPAIR LEFT INGUINAL HERNIA WITH MESH;  Surgeon: Eletha Boas, MD;  Location: [facility];  Service: General;  Laterality: Left;   PARATHYROIDECTOMY Right 01/26/2020   Procedure: RIGHT INFERIOR PARATHYROIDECTOMY;  Surgeon: Eletha Boas, MD;  Location: [facility];  Service: General;  Laterality: Right;   peroral endoscopic myotomy     at [facility]   SMALL INTESTINE SURGERY  01/06/2012   SPINAL FUSION  2018, 2020   L2 L5 in 2018, L5-S1 2020   TRANSURETHRAL RESECTION OF PROSTATE  09/2023   VIDEO BRONCHOSCOPY N/A 04/08/2020   Procedure: VIDEO BRONCHOSCOPY WITH FLUORO;  Surgeon: Theophilus Roosevelt, MD;  Location: [facility];  Service: Cardiopulmonary;  Laterality: N/A;  Prior to Admission medications   Medication Sig Start Date End Date Taking? Authorizing Provider  ezetimibe  (ZETIA ) 10 MG tablet Take 1 tablet (10 mg total) by mouth daily. 05/02/23 03/27/24 Yes Acharya, Gayatri A, MD  ezetimibe  (ZETIA ) 10 MG tablet Take 10 mg by mouth daily.   Yes [provider]  famotidine  (PEPCID ) 20 MG tablet TAKE 1 TABLET AT BEDTIME Patient  taking differently: Take 20 mg by mouth 3 times/day as needed-between meals & bedtime. 03/05/24  Yes Athol Bolds V, MD  fluticasone  (FLONASE ) 50 MCG/ACT nasal spray Place 2 sprays into both nostrils daily. 03/10/24  Yes [name], Reagan, MD  hyoscyamine  (LEVSIN  SL) 0.125 MG SL tablet DISSOLVE 1 TABLET UNDER THE TONGUE TWICE DAILY (NEED APPOINTMENT) AS NEEDED Patient taking differently: every 6 (six) hours as needed. 02/15/23  Yes Tareek Sabo V, MD  metoprolol  succinate (TOPROL -XL) 25 MG 24 hr tablet Take 1 tablet (25 mg total) by mouth daily. Take one tablet daily 01/21/24  Yes Acharya, Gayatri A, MD  Multiple Vitamins-Minerals (CENTRUM SILVER 50+MEN) TABS Take 1 tablet by mouth daily after breakfast.   Yes [provider]  omeprazole  (PRILOSEC) 40 MG capsule TAKE 1 CAPSULE TWICE DAILY 07/30/23  Yes Philo Kurtz V, MD  promethazine  (PHENERGAN ) 12.5 MG tablet Take 1 tablet (12.5 mg total) by mouth every 6 (six) hours as needed for nausea or vomiting. 01/02/24  Yes Zehr, Jessica D, PA-C  propafenone  (RYTHMOL ) 225 MG tablet Take 1 tablet (225 mg total) by mouth every 8 (eight) hours. 01/03/24  Yes Kate Lonni CROME, MD  traMADol  (ULTRAM ) 50 MG tablet Take 1 tablet (50 mg total) by mouth 2 (two) times daily as needed for moderate pain (pain score 4-6). 02/13/24  Yes [name], Reagan, MD  venlafaxine  XR (EFFEXOR -XR) 150 MG 24 hr capsule TAKE 1 CAPSULE EVERY DAY WITH BREAKFAST 11/20/23  Yes Orlando Pond, DO  acetaminophen  (TYLENOL ) 500 MG tablet Take 500-1,000 mg by mouth daily as needed for moderate pain (pain score 4-6), fever or headache.    [provider]  apixaban  (ELIQUIS ) 5 MG TABS tablet Take 5 mg by mouth 2 (two) times daily.    [provider]    Current Outpatient Medications  Medication Sig Dispense Refill   ezetimibe  (ZETIA ) 10 MG tablet Take 1 tablet (10 mg total) by mouth daily. 90 tablet 3   ezetimibe  (ZETIA ) 10 MG tablet Take 10  mg by mouth daily.     famotidine  (PEPCID ) 20 MG tablet TAKE 1 TABLET AT BEDTIME (Patient taking differently: Take 20 mg by mouth 3 times/day as needed-between meals & bedtime.) 90 tablet 3   fluticasone  (FLONASE ) 50 MCG/ACT nasal spray Place 2 sprays into both nostrils daily. 16 g 6   hyoscyamine  (LEVSIN  SL) 0.125 MG SL tablet DISSOLVE 1 TABLET UNDER THE TONGUE TWICE DAILY (NEED APPOINTMENT) AS NEEDED (Patient taking differently: every 6 (six) hours as needed.) 120 tablet 5   metoprolol  succinate (TOPROL -XL) 25 MG 24 hr tablet Take 1 tablet (25 mg total) by mouth daily. Take one tablet daily 90 tablet 3   Multiple Vitamins-Minerals (CENTRUM SILVER 50+MEN) TABS Take 1 tablet by mouth daily after breakfast.     omeprazole  (PRILOSEC) 40 MG capsule TAKE 1 CAPSULE TWICE DAILY 180 capsule 3   promethazine  (PHENERGAN ) 12.5 MG tablet Take 1 tablet (12.5 mg total) by mouth every 6 (six) hours as needed for nausea or vomiting. 30 tablet 2   propafenone  (RYTHMOL ) 225 MG tablet Take 1 tablet (  225 mg total) by mouth every 8 (eight) hours. 270 tablet 2   traMADol  (ULTRAM ) 50 MG tablet Take 1 tablet (50 mg total) by mouth 2 (two) times daily as needed for moderate pain (pain score 4-6). 60 tablet 0   venlafaxine  XR (EFFEXOR -XR) 150 MG 24 hr capsule TAKE 1 CAPSULE EVERY DAY WITH BREAKFAST 90 capsule 3   acetaminophen  (TYLENOL ) 500 MG tablet Take 500-1,000 mg by mouth daily as needed for moderate pain (pain score 4-6), fever or headache.     apixaban  (ELIQUIS ) 5 MG TABS tablet Take 5 mg by mouth 2 (two) times daily.     Current Facility-Administered Medications  Medication Dose Route Frequency Provider Last Rate Last Admin   0.9 %  sodium chloride  infusion  500 mL Intravenous Once Dot Splinter V, MD        Allergies as of 03/27/2024 - Review Complete 03/27/2024  Allergen Reaction Noted   Mobic [meloxicam] Shortness Of Breath and Other (See Comments) 01/17/2019   Nsaids Other (See Comments) 04/21/2023    Statins Other (See Comments) 01/17/2019   Latex Dermatitis, Hives, Itching, and Rash 09/01/2023    Family History  Problem Relation Age of Onset   GER disease Mother    Cancer Mother    Emphysema Father        smoker   Lung cancer Sister        never smoked   GER disease Daughter    GER disease Son    Colon cancer Neg Hx    Stomach cancer Neg Hx    Esophageal cancer Neg Hx    Rectal cancer Neg Hx    Colon polyps Neg Hx     Social History   Socioeconomic History   Marital status: Married    Spouse name: Heron   Number of children: 2   Years of education: 16   Highest education level: Associate degree: occupational, Scientist, product/process development, or vocational program  Occupational History   Occupation: retired    Associate Professor: [employer]  Tobacco Use   Smoking status: Never   Smokeless tobacco: Never  Vaping Use   Vaping status: Never Used  Substance and Sexual Activity   Alcohol use: Yes    Alcohol/week: 10.0 standard drinks of alcohol    Types: 10 Cans of beer per week   Drug use: Never   Sexual activity: Yes    Birth control/protection: None  Other Topics Concern   Not on file  Social History Narrative   Patient lives with his wife in [place].    Patient has one son in [place] and one daughter in [place].    Patient walks daily at local parks/trails around [place].    Patient has one dog.    Social Drivers of Corporate investment banker Strain: Low Risk  (01/07/2024)   Overall Financial Resource Strain (CARDIA)    Difficulty of Paying Living Expenses: Not hard at all  Food Insecurity: No Food Insecurity (01/07/2024)   Hunger Vital Sign    Worried About Running Out of Food in the Last Year: Never true    Ran Out of Food in the Last Year: Never true  Transportation Needs: No Transportation Needs (01/07/2024)   PRAPARE - Administrator, Civil Service (Medical): No    Lack of Transportation (Non-Medical): No  Physical Activity: Insufficiently Active (01/07/2024)    Exercise Vital Sign    Days of Exercise per Week: 3 days    Minutes of Exercise per Session:  10 min  Stress: No Stress Concern Present (01/07/2024)   [employer] of Occupational Health - Occupational Stress Questionnaire    Feeling of Stress: Not at all  Social Connections: Socially Integrated (01/07/2024)   Social Connection and Isolation Panel    Frequency of Communication with Friends and Family: Twice a week    Frequency of Social Gatherings with Friends and Family: Once a week    Attends Religious Services: More than 4 times per year    Active Member of [employer] or Organizations: Yes    Attends Banker Meetings: Never    Marital Status: Married  Catering manager Violence: Not At Risk (04/21/2023)   Humiliation, Afraid, Rape, and Kick questionnaire    Fear of Current or Ex-Partner: No    Emotionally Abused: No    Physically Abused: No    Sexually Abused: No    Review of Systems:  All other review of systems negative except as mentioned in the HPI.  Physical Exam: Vital signs in last 24 hours: BP 99/73   Pulse 74   Temp (!) 97.2 F (36.2 C) (Temporal)   Ht 6' (1.829 m)   Wt 208 lb (94.3 kg)   SpO2 97%   BMI 28.21 kg/m  General:   Alert, NAD Lungs:  Clear .   Heart:  Regular rate and rhythm Abdomen:  Soft, nontender and nondistended. Neuro/Psych:  Alert and cooperative. Normal mood and affect. A and O x 3  Reviewed labs, radiology imaging, old records and pertinent past GI work up  Patient is appropriate for planned procedure(s) and anesthesia in an ambulatory setting   K. Veena Zahriah Roes , MD [phone]

## 2024-03-27 NOTE — Patient Instructions (Signed)
 Handouts given: Barrett's Esophagus Resume previous diet. Await pathology results. Resume Eliquis  (apixaban ) at prior dose tomorrow.  YOU HAD AN ENDOSCOPIC PROCEDURE TODAY AT THE [facility]:   Refer to the procedure report that was given to you for any specific questions about what was found during the examination.  If the procedure report does not answer your questions, please call your gastroenterologist to clarify.  If you requested that your care partner not be given the details of your procedure findings, then the procedure report has been included in a sealed envelope for you to review at your convenience later.  YOU SHOULD EXPECT: Some feelings of bloating in the abdomen. Passage of more gas than usual.  Walking can help get rid of the air that was put into your GI tract during the procedure and reduce the bloating. If you had a lower endoscopy (such as a colonoscopy or flexible sigmoidoscopy) you may notice spotting of blood in your stool or on the toilet paper. If you underwent a bowel prep for your procedure, you may not have a normal bowel movement for a few days.  Please Note:  You might notice some irritation and congestion in your nose or some drainage.  This is from the oxygen used during your procedure.  There is no need for concern and it should clear up in a day or so.  SYMPTOMS TO REPORT IMMEDIATELY:  Following upper endoscopy (EGD)  Vomiting of blood or coffee ground material  New chest pain or pain under the shoulder blades  Painful or persistently difficult swallowing  New shortness of breath  Fever of 100F or higher  Black, tarry-looking stools  For urgent or emergent issues, a gastroenterologist can be reached at any hour by calling [phone]. Do not use MyChart messaging for urgent concerns.    DIET:  We do recommend a small meal at first, but then you may proceed to your regular diet.  Drink plenty of fluids but you should avoid alcoholic  beverages for 24 hours.  ACTIVITY:  You should plan to take it easy for the rest of today and you should NOT DRIVE or use heavy machinery until tomorrow (because of the sedation medicines used during the test).    FOLLOW UP: Our staff will call the number listed on your records the next business day following your procedure.  We will call around 7:15- 8:00 am to check on you and address any questions or concerns that you may have regarding the information given to you following your procedure. If we do not reach you, we will leave a message.     If any biopsies were taken you will be contacted by phone or by letter within the next 1-3 weeks.  Please call us  at [phone] if you have not heard about the biopsies in 3 weeks.    SIGNATURES/CONFIDENTIALITY: You and/or your care partner have signed paperwork which will be entered into your electronic medical record.  These signatures attest to the fact that that the information above on your After Visit Summary has been reviewed and is understood.  Full responsibility of the confidentiality of this discharge information lies with you and/or your care-partner.

## 2024-03-27 NOTE — Progress Notes (Signed)
1519 Robinul 0.1 mg IV given due large amount of secretions upon assessment.  MD made aware, vss

## 2024-03-27 NOTE — Progress Notes (Signed)
 1527  Pt experienced laryngeal spasm with jaw thrust performed. vss

## 2024-03-27 NOTE — Op Note (Signed)
 [facility] Patient Name: Thomas Dominguez Procedure Date: 03/27/2024 3:14 PM MRN: [id number] Endoscopist: Gustav ALONSO Mcgee , MD, [id number] Age: 78 Referring MD:  Date of Birth: 1946-02-02 Gender: Male Account #: [id number] Procedure:                Upper GI endoscopy Indications:              For endoscopic therapy of Barrett's esophagus with                            low grade dysplasia Medicines:                Monitored Anesthesia Care Procedure:                Pre-Anesthesia Assessment:                           - Prior to the procedure, a History and Physical                            was performed, and patient medications and                            allergies were reviewed. The patient's tolerance of                            previous anesthesia was also reviewed. The risks                            and benefits of the procedure and the sedation                            options and risks were discussed with the patient.                            All questions were answered, and informed consent                            was obtained. Prior Anticoagulants: The patient                            last took Eliquis  (apixaban ) 2 days prior to the                            procedure. ASA Grade Assessment: III - A patient                            with severe systemic disease. After reviewing the                            risks and benefits, the patient was deemed in                            satisfactory condition to undergo the procedure.  After obtaining informed consent, the endoscope was                            passed under direct vision. Throughout the                            procedure, the patient's blood pressure, pulse, and                            oxygen saturations were monitored continuously. The                            Olympus Scope [id number] was introduced through the                            mouth, and  advanced to the second part of duodenum.                            The upper GI endoscopy was accomplished without                            difficulty. The patient tolerated the procedure                            well. Scope In: Scope Out: Findings:                 An esophago-gastric anastomosis was found in the                            middle third of the esophagus.                           There were esophageal mucosal changes consistent                            with short-segment Barrett's esophagus present at                            the gastroesophageal junction. The maximum                            longitudinal extent of these mucosal changes was 2                            cm in length. Mucosa was biopsied with a cold                            forceps for histology. One specimen bottle was sent                            to pathology.                           A medium amount of food (residue) was found in  the                            gastric body. Suctioned through scope. No evidence                            of pyloric stenosis or gastric outlet obstruction.                           The cardia and gastric fundus were normal on                            retroflexion.                           The examined duodenum was normal. Complications:            No immediate complications. Estimated Blood Loss:     Estimated blood loss was minimal. Impression:               - An esophago-gastric anastomosis was found.                           - Esophageal mucosal changes consistent with                            short-segment Barrett's esophagus. Biopsied.                           - A medium amount of food (residue) in the stomach.                           - Normal examined duodenum. Recommendation:           - Resume previous diet.                           - Continue present medications.                           - Await pathology results.                            - Resume Eliquis  (apixaban ) at prior dose tomorrow.                            Refer to managing physician for further adjustment                            of therapy. Thomas Dominguez V. Thomas Dieguez, MD 03/27/2024 3:39:48 PM This report has been signed electronically.

## 2024-03-28 ENCOUNTER — Telehealth: Payer: Self-pay | Admitting: *Deleted

## 2024-03-28 NOTE — Telephone Encounter (Signed)
  Follow up Call-     03/27/2024    2:04 PM  Call back number  Post procedure Call Back phone  # [phone]  Permission to leave phone message Yes     Patient questions:  Message left to call if necessary.

## 2024-04-01 LAB — SURGICAL PATHOLOGY

## 2024-04-02 ENCOUNTER — Ambulatory Visit: Payer: Self-pay | Admitting: Gastroenterology

## 2024-04-02 NOTE — Telephone Encounter (Signed)
 Please schedule office follow-up visit.  Please check if Voquezna 20mg  daily is covered under his insurance, Please send Rx for 30 days with 2 refills.  Once he starts taking Voquezna, he can discontinue omeprazole 

## 2024-04-03 ENCOUNTER — Other Ambulatory Visit: Payer: Self-pay

## 2024-04-03 ENCOUNTER — Other Ambulatory Visit: Payer: Self-pay | Admitting: Gastroenterology

## 2024-04-03 MED ORDER — VOQUEZNA 20 MG PO TABS: 20.0000 mg | ORAL_TABLET | Freq: Every day | ORAL | 2 refills | Status: DC

## 2024-04-16 DIAGNOSIS — H2181 Floppy iris syndrome: Secondary | ICD-10-CM | POA: Diagnosis not present

## 2024-04-16 DIAGNOSIS — Z961 Presence of intraocular lens: Secondary | ICD-10-CM | POA: Diagnosis not present

## 2024-04-16 DIAGNOSIS — H43813 Vitreous degeneration, bilateral: Secondary | ICD-10-CM | POA: Diagnosis not present

## 2024-04-17 DIAGNOSIS — N400 Enlarged prostate without lower urinary tract symptoms: Secondary | ICD-10-CM | POA: Diagnosis not present

## 2024-04-17 DIAGNOSIS — N5201 Erectile dysfunction due to arterial insufficiency: Secondary | ICD-10-CM | POA: Diagnosis not present

## 2024-04-18 ENCOUNTER — Encounter: Payer: Self-pay | Admitting: Internal Medicine

## 2024-04-21 MED ORDER — EZETIMIBE 10 MG PO TABS: 10.0000 mg | ORAL_TABLET | Freq: Every day | ORAL | 2 refills | Status: AC

## 2024-05-17 ENCOUNTER — Other Ambulatory Visit: Payer: Self-pay | Admitting: Gastroenterology

## 2024-05-27 DIAGNOSIS — M25552 Pain in left hip: Secondary | ICD-10-CM | POA: Diagnosis not present

## 2024-06-28 ENCOUNTER — Other Ambulatory Visit: Payer: Self-pay | Admitting: Gastroenterology

## 2024-07-09 ENCOUNTER — Ambulatory Visit: Admitting: Gastroenterology

## 2024-07-09 ENCOUNTER — Encounter: Payer: Self-pay | Admitting: Gastroenterology

## 2024-07-09 VITALS — BP 110/70 | HR 52 | Ht 72.0 in | Wt 209.8 lb

## 2024-07-09 DIAGNOSIS — F1090 Alcohol use, unspecified, uncomplicated: Secondary | ICD-10-CM

## 2024-07-09 DIAGNOSIS — K2270 Barrett's esophagus without dysplasia: Secondary | ICD-10-CM

## 2024-07-09 DIAGNOSIS — K2100 Gastro-esophageal reflux disease with esophagitis, without bleeding: Secondary | ICD-10-CM

## 2024-07-09 DIAGNOSIS — Z8501 Personal history of malignant neoplasm of esophagus: Secondary | ICD-10-CM | POA: Diagnosis not present

## 2024-07-09 DIAGNOSIS — K3184 Gastroparesis: Secondary | ICD-10-CM | POA: Diagnosis not present

## 2024-07-09 DIAGNOSIS — R110 Nausea: Secondary | ICD-10-CM

## 2024-07-09 DIAGNOSIS — K311 Adult hypertrophic pyloric stenosis: Secondary | ICD-10-CM

## 2024-07-09 MED ORDER — ONDANSETRON 4 MG PO TBDP: 4.0000 mg | ORAL_TABLET | Freq: Every day | ORAL | 3 refills | Status: AC | PRN

## 2024-07-09 NOTE — Progress Notes (Signed)
 "                Thomas Dominguez    [id number]    15-Mar-1946  Primary Care Physician:Thomas Smucker, Elio, MD  Referring Physician: [name], Reagan, MD [address] [place],  [place] [address]   Chief complaint:  barrett's esophagus, gastroparesis  Discussed the use of AI scribe software for clinical note transcription with the patient, who gave verbal consent to proceed.  History of Present Illness Thomas Dominguez is a 79 year old male with Barrett's esophagus, prior esophageal cancer post-esophagectomy with gastric pull-up, and gastroparesis who presents for follow-up regarding gastric emptying and symptom management.  Barrett's Esophagus and Post-Esophagectomy Status: - Barrett's esophagus remains stable without recent changes - Esophageal cancer treated with surgical resection and gastric pull-up in 1997 - Most recent endoscopy in October 2025 showed no retained food in the stomach  Gastric Emptying and Gastroparesis Symptoms: - Intermittent episodes of delayed gastric emptying, particularly in the afternoon and evening - Episodes resolve after a few days - Decreased ability to eat large meals; physically doesn't feel like [he] can eat much - Multiple interventions since surgery, including ablations, pyloric dilations, and pyloric POEM in August 2024 - Initial dilation provided significant improvement; subsequent procedures less effective  Pharmacologic Management of Gastric Symptoms: - Uses Zegerid as needed, with approximately 12 pills taken over a month during a recent episode, providing relief - Continues daily omeprazole ; Zegerid used only as needed, last use several months ago  Nausea Management: - Uses Zofran  for nausea, typically twice a week, mostly in the evening - Increased Zofran  use around the time of the POEM - Phenergan  used in the past due to insurance limitations on Zofran  refills  Bowel Habits and Other Gastrointestinal Symptoms: -  Regular daily bowel movements - No regurgitation   EGD 03/28/2024 - An esophago- gastric anastomosis was found. - Esophageal mucosal changes consistent with short- segment Barrett' s esophagus. Biopsied. - A medium amount of food ( residue) in the stomach. - Normal examined duodenum. 1. Barrett's esophagus without dysplasia   Outpatient Encounter Medications as of 07/09/2024  Medication Sig   acetaminophen  (TYLENOL ) 500 MG tablet Take 500-1,000 mg by mouth daily as needed for moderate pain (pain score 4-6), fever or headache.   apixaban  (ELIQUIS ) 5 MG TABS tablet Take 5 mg by mouth 2 (two) times daily.   ezetimibe  (ZETIA ) 10 MG tablet Take 1 tablet (10 mg total) by mouth daily.   famotidine  (PEPCID ) 20 MG tablet TAKE 1 TABLET AT BEDTIME (Patient taking differently: Take 20 mg by mouth 3 times/day as needed-between meals & bedtime.)   fluticasone  (FLONASE ) 50 MCG/ACT nasal spray Place 2 sprays into both nostrils daily.   hyoscyamine  (LEVSIN  SL) 0.125 MG SL tablet DISSOLVE 1 TABLET UNDER THE TONGUE TWICE DAILY (NEED APPOINTMENT) AS NEEDED (Patient taking differently: every 6 (six) hours as needed.)   metoprolol  succinate (TOPROL -XL) 25 MG 24 hr tablet Take 1 tablet (25 mg total) by mouth daily. Take one tablet daily   Multiple Vitamins-Minerals (CENTRUM SILVER 50+MEN) TABS Take 1 tablet by mouth daily after breakfast.   omeprazole  (PRILOSEC) 40 MG capsule TAKE 1 CAPSULE TWICE DAILY   Omeprazole -Sodium Bicarbonate (ZEGERID) 20-1100 MG CAPS capsule TAKE 1 CAPSULE BY MOUTH AS NEEDED.   promethazine  (PHENERGAN ) 12.5 MG tablet Take 1 tablet (12.5 mg total) by mouth every 6 (six) hours as needed for nausea or vomiting.   propafenone  (RYTHMOL ) 225 MG tablet Take 1 tablet (  225 mg total) by mouth every 8 (eight) hours.   traMADol  (ULTRAM ) 50 MG tablet Take 1 tablet (50 mg total) by mouth 2 (two) times daily as needed for moderate pain (pain score 4-6).   venlafaxine  XR (EFFEXOR -XR) 150 MG 24 hr capsule  TAKE 1 CAPSULE EVERY DAY WITH BREAKFAST   No facility-administered encounter medications on file as of 07/09/2024.    Allergies as of 07/09/2024 - Review Complete 07/09/2024  Allergen Reaction Noted   Mobic [meloxicam] Shortness Of Breath and Other (See Comments) 01/17/2019   Nsaids Other (See Comments) 04/21/2023   Statins Other (See Comments) 01/17/2019   Latex Dermatitis, Hives, Itching, and Rash 09/01/2023    Past Medical History:  Diagnosis Date   Acute ataxia 09/30/2019   Acute pneumothorax 04/08/2020   Allergy Mobic...SABRASABRAStatins   Atrial fibrillation (HCC) 01/17/2019   BPH (benign prostatic hyperplasia) 01/17/2019   Chronic pulmonary aspiration    Degenerative disc disease, lumbar 01/17/2019   Depression, major, single episode, complete remission 01/17/2019   Displacement of lumbar intervertebral disc without myelopathy 04/22/2010   Diverticulosis 01/17/2019   Dysrhythmia    Dysrhythmias 12/19/2011   Esophageal cancer (HCC)    Gastroparesis    GERD (gastroesophageal reflux disease)    H/O small bowel obstruction 01/17/2019   Hiatal hernia 01/17/2019   History of esophagectomy [facility] at [facility] for severe Barrett's with microfocus of cancer 11/22/2019   Hyperlipidemia 2020   Hypertension    ILD (interstitial lung disease) (HCC)    Inguinal hernia    bilateral, left more prominant   Left inguinal hernia 03/21/2021   Leg swelling 01/17/2019   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HCC)    Melanoma (HCC) 07/2020   Dr Rolan Molt   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   Pneumonia 11/2019   Pneumothorax 04/08/2020   Sacroiliitis    SBO (small bowel obstruction) (HCC) 11/20/2019   Status post left inguinal hernia repair 04/15/2021   Wears glasses     Past Surgical History:  Procedure Laterality Date   ABDOMINAL HERNIA REPAIR  2012, 2014, 2017   ventral   BRONCHIAL BIOPSY  04/08/2020   Procedure: BRONCHIAL BIOPSIES;  Surgeon: Mannam, Praveen, MD;  Location: [facility];  Service:  Cardiopulmonary;;   BRONCHIAL BRUSHINGS  04/08/2020   Procedure: BRONCHIAL BRUSHINGS;  Surgeon: Mannam, Praveen, MD;  Location: [facility];  Service: Cardiopulmonary;;   BRONCHIAL WASHINGS  04/08/2020   Procedure: BRONCHIAL WASHINGS;  Surgeon: Mannam, Praveen, MD;  Location: [facility];  Service: Cardiopulmonary;;   COLONOSCOPY  2019   ESOPHAGECTOMY  1997   for esophageal cancer   ESOPHAGOGASTRODUODENOSCOPY (EGD) WITH PROPOFOL  N/A 06/11/2020   Procedure: ESOPHAGOGASTRODUODENOSCOPY (EGD) WITH PROPOFOL ;  Surgeon: Shila Gustav GAILS, MD;  Location: [facility];  Service: Endoscopy;  Laterality: N/A;   EYE SURGERY  2\27\2024   Cataracts removed   HERNIA REPAIR  Ventral,incisional 5x & inquinal       plu5   Last 03/22/2021   INGUINAL HERNIA REPAIR Left 03/22/2021   Procedure: OPEN REPAIR LEFT INGUINAL HERNIA WITH MESH;  Surgeon: Eletha Boas, MD;  Location: [facility];  Service: General;  Laterality: Left;   PARATHYROIDECTOMY Right 01/26/2020   Procedure: RIGHT INFERIOR PARATHYROIDECTOMY;  Surgeon: Eletha Boas, MD;  Location: [facility];  Service: General;  Laterality: Right;   peroral endoscopic myotomy     at [facility]   SMALL INTESTINE SURGERY  01/06/2012   SPINAL FUSION  2018, 2020   L2 L5 in 2018, L5-S1 2020   TRANSURETHRAL RESECTION OF PROSTATE  09/2023   VIDEO BRONCHOSCOPY N/A 04/08/2020   Procedure: VIDEO BRONCHOSCOPY WITH FLUORO;  Surgeon: Mannam, Praveen, MD;  Location: [facility];  Service: Cardiopulmonary;  Laterality: N/A;    Family History  Problem Relation Age of Onset   GER disease Mother    Cancer Mother    Emphysema Father        smoker   Lung cancer Sister        never smoked   GER disease Daughter    GER disease Son    Colon cancer Neg Hx    Stomach cancer Neg Hx    Esophageal cancer Neg Hx    Rectal cancer Neg Hx    Colon polyps Neg Hx     Social History   Socioeconomic History   Marital status: Married    Spouse name: Heron   Number of children: 2    Years of education: 16   Highest education level: Associate degree: occupational, scientist, product/process development, or vocational program  Occupational History   Occupation: retired    Associate Professor: [employer]  Tobacco Use   Smoking status: Never   Smokeless tobacco: Never  Vaping Use   Vaping status: Never Used  Substance and Sexual Activity   Alcohol use: Yes    Alcohol/week: 10.0 standard drinks of alcohol    Types: 10 Cans of beer per week   Drug use: Never   Sexual activity: Yes    Birth control/protection: None  Other Topics Concern   Not on file  Social History Narrative   Patient lives with his wife in [place].    Patient has one son in [place] and one daughter in [place].    Patient walks daily at local parks/trails around [place].    Patient has one dog.    Social Drivers of Health   Tobacco Use: Low Risk (07/09/2024)   Patient History    Smoking Tobacco Use: Never    Smokeless Tobacco Use: Never    Passive Exposure: Not on file  Financial Resource Strain: Low Risk (01/07/2024)   Overall Financial Resource Strain (CARDIA)    Difficulty of Paying Living Expenses: Not hard at all  Food Insecurity: No Food Insecurity (01/07/2024)   Epic    Worried About Programme Researcher, Broadcasting/film/video in the Last Year: Never true    Ran Out of Food in the Last Year: Never true  Transportation Needs: No Transportation Needs (01/07/2024)   Epic    Lack of Transportation (Medical): No    Lack of Transportation (Non-Medical): No  Physical Activity: Insufficiently Active (01/07/2024)   Exercise Vital Sign    Days of Exercise per Week: 3 days    Minutes of Exercise per Session: 10 min  Stress: No Stress Concern Present (01/07/2024)   [employer] of Occupational Health - Occupational Stress Questionnaire    Feeling of Stress: Not at all  Social Connections: Socially Integrated (01/07/2024)   Social Connection and Isolation Panel    Frequency of Communication with Friends and Family: Twice a week    Frequency of Social  Gatherings with Friends and Family: Once a week    Attends Religious Services: More than 4 times per year    Active Member of [employer] or Organizations: Yes    Attends Banker Meetings: Never    Marital Status: Married  Catering Manager Violence: Not At Risk (04/21/2023)   Humiliation, Afraid, Rape, and Kick questionnaire    Fear of Current or Ex-Partner: No    Emotionally Abused: No  Physically Abused: No    Sexually Abused: No  Depression (PHQ2-9): Medium Risk (01/29/2024)   Depression (PHQ2-9)    PHQ-2 Score: 6  Alcohol Screen: Low Risk (01/07/2024)   Alcohol Screen    Last Alcohol Screening Score (AUDIT): 3  Housing: Low Risk (01/07/2024)   Epic    Unable to Pay for Housing in the Last Year: No    Number of Times Moved in the Last Year: 0    Homeless in the Last Year: No  Utilities: Not At Risk (04/25/2023)   [facility] Utilities    Threatened with loss of utilities: No  Health Literacy: Not on file      Review of systems: All other review of systems negative except as mentioned in the HPI.   Physical Exam: Vitals:   07/09/24 0946  BP: 110/70  Pulse: (!) 52   Body mass index is 28.45 kg/m. Gen:      No acute distress HEENT:  sclera anicteric CV: s1s2 rrr, no murmur Lungs: B/l clear. Abd:      soft, non-tender; no palpable masses, no distension Ext:    No edema Neuro: alert and oriented x 3 Psych: normal mood and affect  Data Reviewed:  Reviewed labs, radiology imaging, old records and pertinent past GI  Assessment & Plan Barrett's esophagus without dysplasia Chronic, well-managed Barrett's esophagus without evidence of dysplasia or interval change. - Continued surveillance and management.  History of esophageal cancer, post-esophagectomy with gastric pull-up Post-esophagectomy anatomy remains altered but stable, without acute surgical complications. - Communicated with Dr. Francois regarding recent endoscopy findings and management  options.  Gastroparesis due to post-surgical changes Gastroparesis symptoms intermittently worsen but are currently well-controlled; persistent pyloric scar tissue post-POEM and dilation confers ongoing risk for delayed gastric emptying and reflux. - Sent message to Dr. Francois for input on further pyloric dilation or repeat POEM. - Encouraged him to contact Dr. Francois for virtual or telephone consultation regarding management. - If intervention indicated, consider pyloric channel dilation with 20 mm balloon rather than 30 mm, given post-POEM status. - Scheduled follow-up in six months or sooner if symptoms worsen.  Nausea secondary to gastroparesis Intermittent evening nausea attributed to delayed gastric emptying; managed with as-needed ondansetron  and omeprazole , without regurgitation or other complications. - Prescribed ondansetron  90 tablets with refills, as needed for nausea; pharmacy instructed to adjust quantity if insurance limits coverage. - Continued omeprazole  as previously prescribed. - Advised to report increased frequency or severity of nausea.     This visit required >40 minutes of patient care (this includes precharting, chart review, review of results, face-to-face time used for counseling as well as treatment plan and follow-up. The patient was provided an opportunity to ask questions and all were answered. The patient agreed with the plan and demonstrated an understanding of the instructions.  LOIS Wilkie Mcgee , MD    CC: Thomas Morrison, Elio, MD    "

## 2024-07-09 NOTE — Patient Instructions (Addendum)
 VISIT SUMMARY:  Thomas Dominguez is a 79 year old male with a history of Barrett's esophagus, prior esophageal cancer post-esophagectomy, and gastroparesis who presented for follow-up regarding gastric emptying and symptom management.  YOUR PLAN:  BARRETT'S ESOPHAGUS: Chronic condition without dysplasia or recent changes. -Continue surveillance and management.  HISTORY OF ESOPHAGEAL CANCER: Post-esophagectomy status remains stable without acute complications. -Communicated with Dr. Francois regarding recent endoscopy findings and management options.  GASTROPARESIS: Intermittent symptoms of delayed gastric emptying, particularly in the afternoon and evening. -Sent message to Dr. Francois for input on further pyloric dilation or redoPOEM. -Contact Dr. Francois for virtual or telephone consultation regarding management. -Follow-up in six months or sooner if symptoms worsen.  NAUSEA: Intermittent evening nausea attributed to delayed gastric emptying. -Prescribed ondansetron  90 tablets with refills, as needed for nausea; pharmacy instructed to adjust quantity if insurance limits coverage. -Continue omeprazole  as previously prescribed. -Report increased frequency or severity of nausea.  I appreciate the  opportunity to care for you  Thank You   Kavitha Nandigam , MD

## 2024-07-14 ENCOUNTER — Encounter: Payer: Self-pay | Admitting: Internal Medicine

## 2024-10-02 ENCOUNTER — Ambulatory Visit: Admitting: Internal Medicine
